# Patient Record
Sex: Male | Born: 1940
Health system: Southern US, Community
[De-identification: ages and names within clinical notes are randomized; demographics above are authoritative.]

## PROBLEM LIST (undated history)

## (undated) DIAGNOSIS — I5022 Chronic systolic (congestive) heart failure: Secondary | ICD-10-CM

## (undated) DIAGNOSIS — I4891 Unspecified atrial fibrillation: Secondary | ICD-10-CM

---

## 2016-03-02 ENCOUNTER — Encounter (HOSPITAL_COMMUNITY): Payer: Self-pay | Admitting: Emergency Medicine

## 2016-03-02 ENCOUNTER — Emergency Department (HOSPITAL_COMMUNITY): Payer: Commercial Managed Care - HMO

## 2016-03-02 ENCOUNTER — Inpatient Hospital Stay (HOSPITAL_COMMUNITY)
Admission: EM | Admit: 2016-03-02 | Discharge: 2016-03-24 | DRG: 233 | Disposition: A | Discharge status: Home Health | Payer: Commercial Managed Care - HMO | Attending: Cardiothoracic Surgery | Admitting: Cardiothoracic Surgery

## 2016-03-02 DIAGNOSIS — I110 Hypertensive heart disease with heart failure: Secondary | ICD-10-CM | POA: Diagnosis not present

## 2016-03-02 DIAGNOSIS — I739 Peripheral vascular disease, unspecified: Secondary | ICD-10-CM | POA: Diagnosis not present

## 2016-03-02 DIAGNOSIS — I10 Essential (primary) hypertension: Secondary | ICD-10-CM

## 2016-03-02 DIAGNOSIS — I200 Unstable angina: Secondary | ICD-10-CM | POA: Diagnosis not present

## 2016-03-02 DIAGNOSIS — R0602 Shortness of breath: Secondary | ICD-10-CM

## 2016-03-02 DIAGNOSIS — I779 Disorder of arteries and arterioles, unspecified: Secondary | ICD-10-CM | POA: Insufficient documentation

## 2016-03-02 DIAGNOSIS — N179 Acute kidney failure, unspecified: Secondary | ICD-10-CM | POA: Diagnosis present

## 2016-03-02 DIAGNOSIS — I25118 Atherosclerotic heart disease of native coronary artery with other forms of angina pectoris: Secondary | ICD-10-CM | POA: Diagnosis not present

## 2016-03-02 DIAGNOSIS — Z9119 Patient's noncompliance with other medical treatment and regimen: Secondary | ICD-10-CM | POA: Diagnosis not present

## 2016-03-02 DIAGNOSIS — Z4682 Encounter for fitting and adjustment of non-vascular catheter: Secondary | ICD-10-CM

## 2016-03-02 DIAGNOSIS — I5031 Acute diastolic (congestive) heart failure: Secondary | ICD-10-CM | POA: Diagnosis not present

## 2016-03-02 DIAGNOSIS — I2510 Atherosclerotic heart disease of native coronary artery without angina pectoris: Secondary | ICD-10-CM | POA: Insufficient documentation

## 2016-03-02 DIAGNOSIS — I25110 Atherosclerotic heart disease of native coronary artery with unstable angina pectoris: Secondary | ICD-10-CM | POA: Diagnosis not present

## 2016-03-02 DIAGNOSIS — E1122 Type 2 diabetes mellitus with diabetic chronic kidney disease: Secondary | ICD-10-CM | POA: Diagnosis present

## 2016-03-02 DIAGNOSIS — I4891 Unspecified atrial fibrillation: Secondary | ICD-10-CM

## 2016-03-02 DIAGNOSIS — I5033 Acute on chronic diastolic (congestive) heart failure: Secondary | ICD-10-CM | POA: Diagnosis not present

## 2016-03-02 DIAGNOSIS — I25119 Atherosclerotic heart disease of native coronary artery with unspecified angina pectoris: Secondary | ICD-10-CM | POA: Diagnosis not present

## 2016-03-02 DIAGNOSIS — N189 Chronic kidney disease, unspecified: Secondary | ICD-10-CM | POA: Diagnosis present

## 2016-03-02 DIAGNOSIS — I5041 Acute combined systolic (congestive) and diastolic (congestive) heart failure: Secondary | ICD-10-CM | POA: Diagnosis not present

## 2016-03-02 DIAGNOSIS — E1151 Type 2 diabetes mellitus with diabetic peripheral angiopathy without gangrene: Secondary | ICD-10-CM | POA: Diagnosis present

## 2016-03-02 DIAGNOSIS — L03115 Cellulitis of right lower limb: Secondary | ICD-10-CM | POA: Diagnosis not present

## 2016-03-02 DIAGNOSIS — E876 Hypokalemia: Secondary | ICD-10-CM | POA: Diagnosis present

## 2016-03-02 DIAGNOSIS — D62 Acute posthemorrhagic anemia: Secondary | ICD-10-CM | POA: Diagnosis not present

## 2016-03-02 DIAGNOSIS — I5043 Acute on chronic combined systolic (congestive) and diastolic (congestive) heart failure: Secondary | ICD-10-CM | POA: Diagnosis not present

## 2016-03-02 DIAGNOSIS — R1310 Dysphagia, unspecified: Secondary | ICD-10-CM | POA: Diagnosis not present

## 2016-03-02 DIAGNOSIS — J90 Pleural effusion, not elsewhere classified: Secondary | ICD-10-CM | POA: Diagnosis present

## 2016-03-02 DIAGNOSIS — M7989 Other specified soft tissue disorders: Secondary | ICD-10-CM | POA: Diagnosis not present

## 2016-03-02 DIAGNOSIS — R069 Unspecified abnormalities of breathing: Secondary | ICD-10-CM | POA: Diagnosis not present

## 2016-03-02 DIAGNOSIS — I252 Old myocardial infarction: Secondary | ICD-10-CM

## 2016-03-02 DIAGNOSIS — Z79899 Other long term (current) drug therapy: Secondary | ICD-10-CM

## 2016-03-02 DIAGNOSIS — D649 Anemia, unspecified: Secondary | ICD-10-CM | POA: Diagnosis not present

## 2016-03-02 DIAGNOSIS — I255 Ischemic cardiomyopathy: Secondary | ICD-10-CM | POA: Diagnosis present

## 2016-03-02 DIAGNOSIS — J9811 Atelectasis: Secondary | ICD-10-CM

## 2016-03-02 DIAGNOSIS — I080 Rheumatic disorders of both mitral and aortic valves: Secondary | ICD-10-CM | POA: Diagnosis not present

## 2016-03-02 DIAGNOSIS — R0989 Other specified symptoms and signs involving the circulatory and respiratory systems: Secondary | ICD-10-CM | POA: Insufficient documentation

## 2016-03-02 DIAGNOSIS — I481 Persistent atrial fibrillation: Secondary | ICD-10-CM | POA: Diagnosis not present

## 2016-03-02 DIAGNOSIS — I160 Hypertensive urgency: Secondary | ICD-10-CM | POA: Diagnosis not present

## 2016-03-02 DIAGNOSIS — Z951 Presence of aortocoronary bypass graft: Secondary | ICD-10-CM | POA: Diagnosis not present

## 2016-03-02 DIAGNOSIS — I6522 Occlusion and stenosis of left carotid artery: Secondary | ICD-10-CM | POA: Diagnosis not present

## 2016-03-02 DIAGNOSIS — I482 Chronic atrial fibrillation: Secondary | ICD-10-CM | POA: Diagnosis not present

## 2016-03-02 DIAGNOSIS — Z7982 Long term (current) use of aspirin: Secondary | ICD-10-CM

## 2016-03-02 DIAGNOSIS — R269 Unspecified abnormalities of gait and mobility: Secondary | ICD-10-CM | POA: Diagnosis not present

## 2016-03-02 DIAGNOSIS — Z95828 Presence of other vascular implants and grafts: Secondary | ICD-10-CM

## 2016-03-02 DIAGNOSIS — Z6826 Body mass index (BMI) 26.0-26.9, adult: Secondary | ICD-10-CM

## 2016-03-02 DIAGNOSIS — I130 Hypertensive heart and chronic kidney disease with heart failure and stage 1 through stage 4 chronic kidney disease, or unspecified chronic kidney disease: Secondary | ICD-10-CM | POA: Diagnosis not present

## 2016-03-02 DIAGNOSIS — R079 Chest pain, unspecified: Secondary | ICD-10-CM | POA: Diagnosis not present

## 2016-03-02 DIAGNOSIS — Z87891 Personal history of nicotine dependence: Secondary | ICD-10-CM | POA: Diagnosis not present

## 2016-03-02 DIAGNOSIS — I6523 Occlusion and stenosis of bilateral carotid arteries: Secondary | ICD-10-CM | POA: Diagnosis not present

## 2016-03-02 DIAGNOSIS — I259 Chronic ischemic heart disease, unspecified: Secondary | ICD-10-CM | POA: Diagnosis not present

## 2016-03-02 DIAGNOSIS — I509 Heart failure, unspecified: Secondary | ICD-10-CM

## 2016-03-02 DIAGNOSIS — I480 Paroxysmal atrial fibrillation: Secondary | ICD-10-CM | POA: Diagnosis not present

## 2016-03-02 DIAGNOSIS — J939 Pneumothorax, unspecified: Secondary | ICD-10-CM | POA: Diagnosis not present

## 2016-03-02 HISTORY — DX: Unspecified atrial fibrillation: I48.91

## 2016-03-02 LAB — CBC WITH DIFFERENTIAL/PLATELET
Basophils Absolute: 0 10*3/uL (ref 0.0–0.1)
Basophils Relative: 0 %
EOS ABS: 0.1 10*3/uL (ref 0.0–0.7)
Eosinophils Relative: 1 %
HEMATOCRIT: 46.1 % (ref 39.0–52.0)
HEMOGLOBIN: 16.3 g/dL (ref 13.0–17.0)
LYMPHS ABS: 1.8 10*3/uL (ref 0.7–4.0)
LYMPHS PCT: 17 %
MCH: 32.5 pg (ref 26.0–34.0)
MCHC: 35.4 g/dL (ref 30.0–36.0)
MCV: 91.8 fL (ref 78.0–100.0)
Monocytes Absolute: 1 10*3/uL (ref 0.1–1.0)
Monocytes Relative: 9 %
NEUTROS ABS: 7.7 10*3/uL (ref 1.7–7.7)
NEUTROS PCT: 73 %
Platelets: 214 10*3/uL (ref 150–400)
RBC: 5.02 MIL/uL (ref 4.22–5.81)
RDW: 13.5 % (ref 11.5–15.5)
WBC: 10.5 10*3/uL (ref 4.0–10.5)

## 2016-03-02 LAB — COMPREHENSIVE METABOLIC PANEL
ALBUMIN: 3.7 g/dL (ref 3.5–5.0)
ALT: 16 U/L — ABNORMAL LOW (ref 17–63)
ANION GAP: 15 (ref 5–15)
AST: 30 U/L (ref 15–41)
Alkaline Phosphatase: 69 U/L (ref 38–126)
BUN: 16 mg/dL (ref 6–20)
CHLORIDE: 105 mmol/L (ref 101–111)
CO2: 20 mmol/L — ABNORMAL LOW (ref 22–32)
Calcium: 10.3 mg/dL (ref 8.9–10.3)
Creatinine, Ser: 0.97 mg/dL (ref 0.61–1.24)
GFR calc Af Amer: >60 mL/min (ref >60)
GFR calc non Af Amer: >60 mL/min (ref >60)
GLUCOSE: 89 mg/dL (ref 65–99)
POTASSIUM: 4.6 mmol/L (ref 3.5–5.1)
SODIUM: 140 mmol/L (ref 135–145)
TOTAL PROTEIN: 6.8 g/dL (ref 6.5–8.1)
Total Bilirubin: 1.7 mg/dL — ABNORMAL HIGH (ref 0.3–1.2)

## 2016-03-02 LAB — I-STAT CHEM 8, ED
BUN: 21 mg/dL — AB (ref 6–20)
CALCIUM ION: 1.29 mmol/L (ref 1.13–1.30)
CHLORIDE: 103 mmol/L (ref 101–111)
CREATININE: 0.9 mg/dL (ref 0.61–1.24)
Glucose, Bld: 88 mg/dL (ref 65–99)
HEMATOCRIT: 49 % (ref 39.0–52.0)
Hemoglobin: 16.7 g/dL (ref 13.0–17.0)
Potassium: 4.2 mmol/L (ref 3.5–5.1)
SODIUM: 141 mmol/L (ref 135–145)
TCO2: 27 mmol/L (ref 0–100)

## 2016-03-02 LAB — TSH: TSH: 1.725 u[IU]/mL (ref 0.350–4.500)

## 2016-03-02 LAB — BRAIN NATRIURETIC PEPTIDE: B Natriuretic Peptide: 513.7 pg/mL — ABNORMAL HIGH (ref 0.0–100.0)

## 2016-03-02 LAB — TROPONIN I: Troponin I: 0.04 ng/mL — ABNORMAL HIGH (ref <0.031)

## 2016-03-02 MED ORDER — LISINOPRIL 20 MG PO TABS
20.0000 mg | ORAL_TABLET | Freq: Every day | ORAL | Status: DC
  Administered 2016-03-03: 20 mg via ORAL
  Filled 2016-03-02: qty 1

## 2016-03-02 MED ORDER — HYDRALAZINE HCL 20 MG/ML IJ SOLN
10.0000 mg | INTRAMUSCULAR | Status: DC | PRN
  Administered 2016-03-02 – 2016-03-03 (×2): 10 mg via INTRAVENOUS
  Filled 2016-03-02 (×3): qty 1

## 2016-03-02 MED ORDER — ACETAMINOPHEN 325 MG PO TABS: 650.0000 mg | ORAL_TABLET | ORAL | Status: DC | PRN

## 2016-03-02 MED ORDER — ONDANSETRON HCL 4 MG/2ML IJ SOLN: 4.0000 mg | Freq: Four times a day (QID) | INTRAMUSCULAR | Status: DC | PRN

## 2016-03-02 MED ORDER — FUROSEMIDE 10 MG/ML IJ SOLN: 40.0000 mg | Freq: Every day | INTRAMUSCULAR | Status: DC

## 2016-03-02 MED ORDER — SODIUM CHLORIDE 0.9% FLUSH
3.0000 mL | Freq: Two times a day (BID) | INTRAVENOUS | Status: DC
  Administered 2016-03-02 – 2016-03-09 (×9): 3 mL via INTRAVENOUS

## 2016-03-02 MED ORDER — IOHEXOL 350 MG/ML SOLN
80.0000 mL | Freq: Once | INTRAVENOUS | Status: AC | PRN
  Administered 2016-03-02: 80 mL via INTRAVENOUS

## 2016-03-02 MED ORDER — SODIUM CHLORIDE 0.9% FLUSH: 3.0000 mL | INTRAVENOUS | Status: DC | PRN

## 2016-03-02 MED ORDER — LISINOPRIL 10 MG PO TABS: 10.0000 mg | ORAL_TABLET | Freq: Every day | ORAL | Status: DC

## 2016-03-02 MED ORDER — LABETALOL HCL 5 MG/ML IV SOLN
10.0000 mg | INTRAVENOUS | Status: DC | PRN
  Filled 2016-03-02: qty 4

## 2016-03-02 MED ORDER — CARVEDILOL 6.25 MG PO TABS
6.2500 mg | ORAL_TABLET | Freq: Two times a day (BID) | ORAL | Status: DC
  Administered 2016-03-03 (×2): 6.25 mg via ORAL
  Filled 2016-03-02 (×2): qty 1

## 2016-03-02 MED ORDER — FUROSEMIDE 10 MG/ML IJ SOLN
40.0000 mg | Freq: Two times a day (BID) | INTRAMUSCULAR | Status: DC
  Administered 2016-03-03 (×2): 40 mg via INTRAVENOUS
  Filled 2016-03-02 (×2): qty 4

## 2016-03-02 MED ORDER — ENSURE ENLIVE PO LIQD
237.0000 mL | Freq: Two times a day (BID) | ORAL | Status: DC
  Administered 2016-03-03 – 2016-03-06 (×5): 237 mL via ORAL

## 2016-03-02 MED ORDER — ASPIRIN 81 MG PO CHEW
81.0000 mg | CHEWABLE_TABLET | ORAL | Status: DC
  Filled 2016-03-02: qty 1

## 2016-03-02 MED ORDER — ENOXAPARIN SODIUM 40 MG/0.4ML ~~LOC~~ SOLN
40.0000 mg | SUBCUTANEOUS | Status: DC
  Filled 2016-03-02 (×2): qty 0.4

## 2016-03-02 MED ORDER — SODIUM CHLORIDE 0.9 % IV SOLN: 250.0000 mL | INTRAVENOUS | Status: DC | PRN

## 2016-03-02 MED ORDER — FUROSEMIDE 10 MG/ML IJ SOLN
40.0000 mg | Freq: Once | INTRAMUSCULAR | Status: AC
  Administered 2016-03-02: 40 mg via INTRAVENOUS
  Filled 2016-03-02: qty 4

## 2016-03-02 NOTE — ED Notes (Signed)
Attempted to call report.  Floor reports they are unable to accept pt due to bp and will call rapid.

## 2016-03-02 NOTE — Progress Notes (Signed)
Pt arrived from ED, alert and oriented, tele placed, and BP 216/85 after receiving IV hydralazine in ED.  Pt asymptomatic, will continue to monitor and give PRNs for BP.

## 2016-03-02 NOTE — ED Provider Notes (Signed)
CSN: [id number]     Arrival date & time 03/02/16  1400 History   First MD Initiated Contact with Patient 03/02/16 1402     Chief Complaint  Patient presents with  . Shortness of Breath     (Consider location/radiation/quality/duration/timing/severity/associated sxs/prior Treatment) HPI  75 year old male presents with dyspnea. Sent from [facility] after being seen and had BP of 220/130. Has not seen a PCP in years. Reports no prior medical problems. Dyspnea is intermittent over last 1 week. Occurs multiple times per day, lasts a few minutes. No associated chest pain/pressure/tightness or diaphoresis. Has had RLE swelling over same 1 week. 1 week prior went to and back from [place] by car. No hemoptysis.   History reviewed. No pertinent past medical history. History reviewed. No pertinent past surgical history. No family history on file. Social History  Substance Use Topics  . Smoking status: Former Games developer  . Smokeless tobacco: None  . Alcohol Use: None    Review of Systems  Eyes: Negative for visual disturbance.  Respiratory: Positive for shortness of breath. Negative for cough.   Cardiovascular: Positive for leg swelling. Negative for chest pain.  Gastrointestinal: Negative for vomiting and abdominal pain.  All other systems reviewed and are negative.     Allergies  Review of patient's allergies indicates no known allergies.  Home Medications   Prior to Admission medications   Not on File   BP 165/121 mmHg  Pulse 99  Temp(Src) 97.8 F (36.6 C) (Oral)  Resp 24  SpO2 98% Physical Exam  Constitutional: He is oriented to person, place, and time. He appears well-developed and well-nourished.  HENT:  Head: Normocephalic and atraumatic.  Right Ear: External ear normal.  Left Ear: External ear normal.  Nose: Nose normal.  Eyes: Right eye exhibits no discharge. Left eye exhibits no discharge.  Neck: Neck supple.  Cardiovascular: Normal rate, regular rhythm,  normal heart sounds and intact distal pulses.   Pulmonary/Chest: Effort normal. He has rales in the right lower field and the left lower field.  Abdominal: Soft. There is no tenderness.  Musculoskeletal: He exhibits edema (Right calf/foot with pitting edema. No significant edema LLE).  Neurological: He is alert and oriented to person, place, and time.  Skin: Skin is warm and dry.  Nursing note and vitals reviewed.   ED Course  Procedures (including critical care time) Labs Review Labs Reviewed  COMPREHENSIVE METABOLIC PANEL - Abnormal; Notable for the following:    CO2 20 (*)    ALT 16 (*)    Total Bilirubin 1.7 (*)    All other components within normal limits  BRAIN NATRIURETIC PEPTIDE - Abnormal; Notable for the following:    B Natriuretic Peptide 513.7 (*)    All other components within normal limits  TROPONIN I - Abnormal; Notable for the following:    Troponin I 0.04 (*)    All other components within normal limits  I-STAT CHEM 8, ED - Abnormal; Notable for the following:    BUN 21 (*)    All other components within normal limits  CBC WITH DIFFERENTIAL/PLATELET    Imaging Review Dg Chest 2 View  03/02/2016  CLINICAL DATA:  Shortness of breath for 2 weeks.  Initial encounter. EXAM: CHEST  2 VIEW COMPARISON:  None. FINDINGS: The patient has small bilateral pleural effusions and basilar airspace disease, both worse on the right. Heart size is upper normal. No pneumothorax is identified. IMPRESSION: Right greater than left basilar airspace disease could be  due to atelectasis or pneumonia. Small pleural effusions are also seen, greater on the right. Electronically Signed   By: Drusilla Kannerhomas  Dalessio M.D.   On: 03/02/2016 15:20   Ct Angio Chest Pe W/cm &/or Wo Cm  03/02/2016  CLINICAL DATA:  Shortness of breath for 1 week EXAM: CT ANGIOGRAPHY CHEST WITH CONTRAST TECHNIQUE: Multidetector CT imaging of the chest was performed using the standard protocol during bolus administration of  intravenous contrast. Multiplanar CT image reconstructions and MIPs were obtained to evaluate the vascular anatomy. CONTRAST:  80mL OMNIPAQUE IOHEXOL 350 MG/ML SOLN COMPARISON:  Plain film from earlier in the same day FINDINGS: Lungs are well aerated bilaterally and demonstrate evidence of bilateral large pleural effusions right greater than left. Associated partial consolidation of the lower lobes is noted bilaterally. The thoracic inlet is within normal limits. Calcific changes of the thoracic aorta are noted without aneurysmal dilatation or dissection. The pulmonary artery shows a normal branching pattern. No filling defects to suggest pulmonary emboli are identified. No significant hilar or mediastinal adenopathy is noted. Scanning into the upper abdomen reveals no acute abnormality. The osseous structures show degenerative change of the thoracic spine. No acute bony abnormality is noted. Review of the MIP images confirms the above findings. IMPRESSION: Large bilateral pleural effusions with associated partial consolidation in the lower lobes. No evidence of pulmonary emboli. Electronically Signed   By: Alcide CleverMark  Lukens M.D.   On: 03/02/2016 15:55   I have personally reviewed and evaluated these images and lab results as part of my medical decision-making.   EKG Interpretation   Date/Time:  Monday March 02 2016 14:07:36 EDT Ventricular Rate:  92 PR Interval:    QRS Duration: 108 QT Interval:  385 QTC Calculation: 476 R Axis:   73 Text Interpretation:  Atrial flutter LVH with secondary repolarization  abnormality Borderline prolonged QT interval Baseline wander in lead(s) II  No old tracing to compare Confirmed by Ilai Hiller  MD, Devaughn Savant ([id number]) on  03/02/2016 2:10:51 PM      MDM   Final diagnoses:  Acute congestive heart failure, unspecified congestive heart failure type [facility](HCC)  Essential hypertension    Patient appears to have new onset CHF. Given his asymmetric leg swelling with recent travel  and dyspnea a CT was obtained to rule out PE and is negative except for the pleural effusions. Patient will be started on IV Lasix. He has hypertension with systolic blood pressures in the 160s-170s. However I think this is more of a subacute presentation. He will need to be admitted for supportive care, diuresis, and further workup. Dr. Izola PriceMyers to admit. DVT u/s currently pending but likely swelling is from CHF, unclear why it is asymmetric.    Pricilla LovelessScott Camil Hausmann, MD 03/02/16 [phone]

## 2016-03-02 NOTE — Progress Notes (Signed)
Preliminary results by tech - Venous Duplex Lower Ext. Completed. Negative for deep and superficial vein thrombosis in the right leg. Jyasia Markoff, BS, RDMS, RVT  

## 2016-03-02 NOTE — H&P (Addendum)
[facility] History and Physical  Demetrice Combes ZOX:[id number] DOB: Oct 06, 1941 DOA: 03/02/2016  Referring physician: ED physician, Dr. Lawrence Marseilles PCP: Joycelyn Rua, MD   Chief Complaint: sent from [facility] office for BP > 220/120  HPI:  Pt is 75 yo male, functional at baseline, presented from [place] office as his BP was > 220/120. Pt reports he has not seen doctor in years and currently reports only occasional exertional dyspnea and some weight gain but is not sure how many pounds. He denies fevers, chills, chest pain, no abd or urinary concerns, take no BP medications.   In ED, pt noted to be hemodynamically stable, VS notable for SBP 190's and blood work unremarkable. CXR with signs of CHF. TR[facility] asked to admit to telemetry bed as pt is asymptomatic.  Assessment and Plan: Active Problems: Hypertensive urgency - pt with dyspnea only otherwise asymptomatic - admit to tele bed if they will accept - place on Lasix, add BB and ACEI - also place on labetalol as needed - ECHO and EKG requested  Acute diastolic CHF - no previous history - Ok to admit to telemetry unit - place on lasix 40 mg IV BID - weight on admission 65 kg - monitor daily weights, strict I/O in AM - follow up on ECHO - consider cardio consult in AM - ? Consolidation in lower lobes - no WBC elevation and no fevers, pt denies productive cough - hold off on ABX and repeat imaging studies in AM to re evaluate response to lasix   Lovenox SQ for DVT prophylaxis   Radiological Exams on Admission: Dg Chest 2 View  03/02/2016  CLINICAL DATA:  Shortness of breath for 2 weeks.  Initial encounter. EXAM: CHEST  2 VIEW COMPARISON:  None. FINDINGS: The patient has small bilateral pleural effusions and basilar airspace disease, both worse on the right. Heart size is upper normal. No pneumothorax is identified. IMPRESSION: Right greater than left basilar airspace disease could be due to atelectasis or pneumonia. Small pleural  effusions are also seen, greater on the right. Electronically Signed   By: Drusilla Kanner M.D.   On: 03/02/2016 15:20   Ct Angio Chest Pe W/cm &/or Wo Cm  03/02/2016  CLINICAL DATA:  Shortness of breath for 1 week EXAM: CT ANGIOGRAPHY CHEST WITH CONTRAST TECHNIQUE: Multidetector CT imaging of the chest was performed using the standard protocol during bolus administration of intravenous contrast. Multiplanar CT image reconstructions and MIPs were obtained to evaluate the vascular anatomy. CONTRAST:  80mL OMNIPAQUE IOHEXOL 350 MG/ML SOLN COMPARISON:  Plain film from earlier in the same day FINDINGS: Lungs are well aerated bilaterally and demonstrate evidence of bilateral large pleural effusions right greater than left. Associated partial consolidation of the lower lobes is noted bilaterally. The thoracic inlet is within normal limits. Calcific changes of the thoracic aorta are noted without aneurysmal dilatation or dissection. The pulmonary artery shows a normal branching pattern. No filling defects to suggest pulmonary emboli are identified. No significant hilar or mediastinal adenopathy is noted. Scanning into the upper abdomen reveals no acute abnormality. The osseous structures show degenerative change of the thoracic spine. No acute bony abnormality is noted. Review of the MIP images confirms the above findings. IMPRESSION: Large bilateral pleural effusions with associated partial consolidation in the lower lobes. No evidence of pulmonary emboli. Electronically Signed   By: Alcide Clever M.D.   On: 03/02/2016 15:55     Code Status: Full Family Communication: Pt at bedside Disposition Plan: Admit for further  evaluation    Danie Binder [facility] [phone]   Review of Systems:  Constitutional: Negative for diaphoresis.  HENT: Negative for hearing loss, ear pain, nosebleeds, congestion, sore throat, neck pain, tinnitus and ear discharge.   Eyes: Negative for blurred vision, double vision, photophobia,  pain, discharge and redness.  Respiratory: Negative for wheezing and stridor.   Cardiovascular: Negative for chest pain, palpitations, orthopnea.  Gastrointestinal: Negative for nausea, vomiting and abdominal pain.  Genitourinary: Negative for dysuria, urgency, frequency, hematuria and flank pain.  Musculoskeletal: Negative for myalgias, back pain, joint pain and falls.  Skin: Negative for itching and rash.  Neurological: Negative for dizziness and weakness.  Endo/Heme/Allergies: Negative for environmental allergies and polydipsia. Does not bruise/bleed easily.  Psychiatric/Behavioral: Negative for suicidal ideas. The patient is not nervous/anxious.     Pt reports no known history of CHF  History reviewed. No pertinent past surgical history.  Social History:  reports that he has quit smoking. He does not have any smokeless tobacco history on file. His alcohol and drug histories are not on file.  No Known Allergies  Pt reports no family history of HTN or CHF.   Prior to Admission medications   Medication Sig Start Date End Date Taking? Authorizing Provider  Ascorbic Acid (VITAMIN C) 1000 MG tablet Take 1,000 mg by mouth daily.   Yes Historical Provider, MD  aspirin 81 MG tablet Take 81 mg by mouth every Monday, Wednesday, and Friday.   Yes Historical Provider, MD  magnesium 30 MG tablet Take 30 mg by mouth daily.   Yes Historical Provider, MD  Omega-3 Fatty Acids (FISH OIL) 1000 MG CAPS Take by mouth.   Yes Historical Provider, MD    Physical Exam: Filed Vitals:   03/02/16 1430 03/02/16 1445 03/02/16 1500 03/02/16 1554  BP: 171/120 168/126 174/109 167/153  Pulse: 87 99 85 99  Temp:      TempSrc:      Resp: SpO2: 94% 97% 93% 97%    Physical Exam  Constitutional: Appears well-developed and well-nourished. No distress.  HENT: Normocephalic. External right and left ear normal. Oropharynx is clear and moist.  Eyes: Conjunctivae and EOM are normal. PERRLA, no scleral  icterus.  Neck: Normal ROM. Neck supple. No tracheal deviation. No thyromegaly.  CVS: RRR, no gallops, no carotid bruit.  Pulmonary: Effort and breath sounds normal, bilateral crackles, no tachypnea and no wheezing  Abdominal: Soft. BS +,  no distension, tenderness, rebound or guarding.  Musculoskeletal: Normal range of motion.  Lymphadenopathy: No lymphadenopathy noted, cervical, inguinal. Neuro: Alert. Normal reflexes, muscle tone coordination. No cranial nerve deficit. Skin: Skin is warm and dry. No rash noted. Not diaphoretic. No erythema. No pallor.  Psychiatric: Normal mood and affect. Behavior, judgment, thought content normal.   Labs on Admission:  Basic Metabolic Panel:  Recent Labs Lab 03/02/16 1421 03/02/16 1429  NA 140 141  K 4.6 4.2  CL 105 103  CO2 20*  --   GLUCOSE 89 88  BUN 16 21*  CREATININE 0.97 0.90  CALCIUM 10.3  --    Liver Function Tests:  Recent Labs Lab 03/02/16 1421  AST 30  ALT 16*  ALKPHOS 69  BILITOT 1.7*  PROT 6.8  ALBUMIN 3.7   No results for input(s): LIPASE, AMYLASE in the last 168 hours. No results for input(s): AMMONIA in the last 168 hours. CBC:  Recent Labs Lab 03/02/16 1421 03/02/16 1429  WBC 10.5  --   NEUTROABS 7.7  --  HGB 16.3 16.7  HCT 46.1 49.0  MCV 91.8  --   PLT 214  --    Cardiac Enzymes:  Recent Labs Lab 03/02/16 1421  TROPONINI 0.04*   EKG: pending    If 7PM-7AM, please contact night-coverage [email or web address] Password [email or web address] 03/02/2016, 4:46 PM

## 2016-03-02 NOTE — Progress Notes (Signed)
Pt BP 210/111 in ED.  RN spoke with MD to make sure pt was appropriate for floor.  MD gave new orders for PRN BP meds and was ok with pt coming to 3e.  Report received from ED RN.

## 2016-03-02 NOTE — ED Notes (Signed)
Coming from [facility] for c/o sob getting worse over the last week , has not seen a dr in 5 years and takes no meds bp at dr office 220/130 no c/o cp or n/v or blurred vision. No cough

## 2016-03-02 NOTE — ED Notes (Addendum)
Vascular at bedside

## 2016-03-02 NOTE — ED Notes (Signed)
Patient transported to CT 

## 2016-03-03 ENCOUNTER — Encounter (HOSPITAL_COMMUNITY): Payer: Self-pay | Admitting: Internal Medicine

## 2016-03-03 ENCOUNTER — Ambulatory Visit (HOSPITAL_COMMUNITY): Payer: Commercial Managed Care - HMO

## 2016-03-03 DIAGNOSIS — I5031 Acute diastolic (congestive) heart failure: Secondary | ICD-10-CM | POA: Diagnosis present

## 2016-03-03 DIAGNOSIS — I5041 Acute combined systolic (congestive) and diastolic (congestive) heart failure: Secondary | ICD-10-CM | POA: Diagnosis present

## 2016-03-03 DIAGNOSIS — I509 Heart failure, unspecified: Secondary | ICD-10-CM

## 2016-03-03 DIAGNOSIS — J90 Pleural effusion, not elsewhere classified: Secondary | ICD-10-CM | POA: Diagnosis present

## 2016-03-03 DIAGNOSIS — I5033 Acute on chronic diastolic (congestive) heart failure: Secondary | ICD-10-CM

## 2016-03-03 DIAGNOSIS — I160 Hypertensive urgency: Secondary | ICD-10-CM

## 2016-03-03 DIAGNOSIS — E876 Hypokalemia: Secondary | ICD-10-CM | POA: Diagnosis present

## 2016-03-03 DIAGNOSIS — I110 Hypertensive heart disease with heart failure: Secondary | ICD-10-CM | POA: Insufficient documentation

## 2016-03-03 LAB — CBC
HEMATOCRIT: 44.6 % (ref 39.0–52.0)
HEMOGLOBIN: 15.4 g/dL (ref 13.0–17.0)
MCH: 31 pg (ref 26.0–34.0)
MCHC: 34.5 g/dL (ref 30.0–36.0)
MCV: 89.9 fL (ref 78.0–100.0)
Platelets: 219 10*3/uL (ref 150–400)
RBC: 4.96 MIL/uL (ref 4.22–5.81)
RDW: 13.5 % (ref 11.5–15.5)
WBC: 10.1 10*3/uL (ref 4.0–10.5)

## 2016-03-03 LAB — BASIC METABOLIC PANEL
ANION GAP: 13 (ref 5–15)
BUN: 15 mg/dL (ref 6–20)
CO2: 25 mmol/L (ref 22–32)
Calcium: 10.7 mg/dL — ABNORMAL HIGH (ref 8.9–10.3)
Chloride: 107 mmol/L (ref 101–111)
Creatinine, Ser: 0.99 mg/dL (ref 0.61–1.24)
GFR calc Af Amer: >60 mL/min (ref >60)
GFR calc non Af Amer: >60 mL/min (ref >60)
Glucose, Bld: 107 mg/dL — ABNORMAL HIGH (ref 65–99)
POTASSIUM: 3.3 mmol/L — AB (ref 3.5–5.1)
Sodium: 145 mmol/L (ref 135–145)

## 2016-03-03 LAB — ECHOCARDIOGRAM COMPLETE
Height: 62 in
Weight: 2312 oz

## 2016-03-03 MED ORDER — SODIUM CHLORIDE 0.9 % WEIGHT BASED INFUSION: 1.0000 mL/kg/h | INTRAVENOUS | Status: DC

## 2016-03-03 MED ORDER — CARVEDILOL 12.5 MG PO TABS
12.5000 mg | ORAL_TABLET | Freq: Two times a day (BID) | ORAL | Status: DC
  Administered 2016-03-03 – 2016-03-06 (×6): 12.5 mg via ORAL
  Filled 2016-03-03 (×6): qty 1

## 2016-03-03 MED ORDER — IRBESARTAN 300 MG PO TABS: 150.0000 mg | ORAL_TABLET | Freq: Every day | ORAL | Status: DC

## 2016-03-03 MED ORDER — ASPIRIN 81 MG PO CHEW
81.0000 mg | CHEWABLE_TABLET | ORAL | Status: AC
  Administered 2016-03-04: 81 mg via ORAL
  Filled 2016-03-03: qty 1

## 2016-03-03 MED ORDER — SODIUM CHLORIDE 0.9% FLUSH
3.0000 mL | Freq: Two times a day (BID) | INTRAVENOUS | Status: DC
  Administered 2016-03-03: 3 mL via INTRAVENOUS

## 2016-03-03 MED ORDER — POTASSIUM CHLORIDE CRYS ER 20 MEQ PO TBCR
40.0000 meq | EXTENDED_RELEASE_TABLET | Freq: Once | ORAL | Status: AC
  Administered 2016-03-03: 40 meq via ORAL
  Filled 2016-03-03: qty 2

## 2016-03-03 MED ORDER — SODIUM CHLORIDE 0.9% FLUSH: 3.0000 mL | INTRAVENOUS | Status: DC | PRN

## 2016-03-03 MED ORDER — SODIUM CHLORIDE 0.9 % IV SOLN: 250.0000 mL | INTRAVENOUS | Status: DC | PRN

## 2016-03-03 MED ORDER — SODIUM CHLORIDE 0.9 % WEIGHT BASED INFUSION
3.0000 mL/kg/h | INTRAVENOUS | Status: DC
  Administered 2016-03-04: 3 mL/kg/h via INTRAVENOUS

## 2016-03-03 NOTE — Progress Notes (Addendum)
Patient ID: [name] [name], male   DOB: Mar 12, 1941, 75 y.o.   MRN: [id number]  [facility] PROGRESS NOTE  Matas Burrows WUJ:[id number] DOB: Apr 16, 1941 DOA: 03/02/2016 PCP: Joycelyn Rua, MD   Brief narrative:    Pt is 75 yo male, functional at baseline, presented from [place] office as his BP was > 220/120. Pt reports he has not seen doctor in years and currently reports only occasional exertional dyspnea and some weight gain but is not sure how many pounds. He denies fevers, chills, chest pain, no abd or urinary concerns, take no BP medications.   In ED, pt noted to be hemodynamically stable, VS notable for SBP 190's and blood work unremarkable. CXR with signs of CHF. TRH asked to admit to telemetry bed as pt is asymptomatic.  Assessment/Plan:    Active Problems: Hypertensive urgency - pt with dyspnea only otherwise asymptomatic - less dyspnea this AM - continue BB, ACEI, added hydralazine as needed  - ECHO pending  - cardiology consulted  - SBP in 140's this AM  Acute systolic and diastolic CHF - no previous history - placed on lasix 40 mg IV BID - weight on admission 65 kg, still same this AM - monitor daily weights, strict I/O in AM - follow up on ECHO - cardiology consulted for assistance - CT chest findings discussed with PCCM, no sings of over PNA on CT  Hypokalemia - from lasix, supplement and repeat BMP in AM  DVT prophylaxis - Lovenox SQ  Code Status: Full Family Communication:  plan of care discussed with the patient Disposition Plan: Home by 3/22  IV access:  Peripheral IV  Procedures and diagnostic studies:    Dg Chest 2 View 03/02/2016 Right greater than left basilar airspace disease could be due to atelectasis or pneumonia. Small pleural effusions are also seen, greater on the right.   Ct Angio Chest Pe W/cm &/or Wo Cm 03/02/2016  Large bilateral pleural effusions with associated partial consolidation in the lower lobes. No evidence of pulmonary emboli.    Medical Consultants:  Cardiology   Other Consultants:  None  IAnti-Infectives:   None  Debbora Presto, MD  [facility] Pager [phone]  If 7PM-7AM, please contact night-coverage [email or web address] Password [facility] 03/03/2016, 12:23 PM   LOS: 1 day   HPI/Subjective: No events overnight. Less dyspnea.  Objective: Filed Vitals:   03/03/16 0357 03/03/16 0658 03/03/16 0938 03/03/16 1004  BP: 136/99 180/90 149/75 142/71  Pulse: 66   76  Temp: 97.9 F (36.6 C)   97.7 F (36.5 C)  TempSrc: Oral   Oral  Resp:    20  Height:      Weight: 65.545 kg (144 lb 8 oz)     SpO2: 98%   93%    Intake/Output Summary (Last 24 hours) at 03/03/16 1223 Last data filed at 03/03/16 8657  Gross per 24 hour  Intake    720 ml  Output    725 ml  Net     -5 ml    Exam:   General:  Pt is alert, follows commands appropriately, not in acute distress  Cardiovascular: Regular rate and rhythm, S1/S2, no murmurs, no rubs, no gallops  Respiratory: Clear to auscultation bilaterally, crackles at bases   Abdomen: Soft, non tender, non distended, bowel sounds present, no guarding  Extremities: pulses DP and PT palpable bilaterally  Neuro: Grossly nonfocal  Data Reviewed: Basic Metabolic Panel:  Recent Labs Lab 03/02/16 1421 03/02/16 1429 03/03/16 0354  NA 140 141 145  K 4.6 4.2 3.3*  CL 105 103 107  CO2 20*  --  25  GLUCOSE 89 88 107*  BUN 16 21* 15  CREATININE 0.97 0.90 0.99  CALCIUM 10.3  --  10.7*   Liver Function Tests:  Recent Labs Lab 03/02/16 1421  AST 30  ALT 16*  ALKPHOS 69  BILITOT 1.7*  PROT 6.8  ALBUMIN 3.7   CBC:  Recent Labs Lab 03/02/16 1421 03/02/16 1429 03/03/16 0354  WBC 10.5  --  10.1  NEUTROABS 7.7  --   --   HGB 16.3 16.7 15.4  HCT 46.1 49.0 44.6  MCV 91.8  --  89.9  PLT 214  --  219   Cardiac Enzymes:  Recent Labs Lab 03/02/16 1421  TROPONINI 0.04*   Scheduled Meds: . aspirin  81 mg Oral Q M,W,F  . carvedilol  6.25 mg Oral BID WC  .  enoxaparin (LOVENOX) injection  40 mg Subcutaneous Q24H  . feeding supplement (ENSURE ENLIVE)  237 mL Oral BID BM  . furosemide  40 mg Intravenous BID  . lisinopril  20 mg Oral Daily  . potassium chloride  40 mEq Oral Once  . sodium chloride flush  3 mL Intravenous Q12H   Continuous Infusions:

## 2016-03-03 NOTE — Progress Notes (Signed)
Nutrition Brief Note  Patient identified on the Malnutrition Screening Tool (MST) report.    Body mass index is 26.5 kg/(m^2).  Patient meets criteria for overweight based on current BMI.  Patient reports good appetite.  Current diet order is regular, patient is consuming approximately 75-100% of meals at this time.  Labs and medications reviewed.  No nutrition interventions warranted at this time.  If nutrition issues arise, please consult RD.  Doroteo Glassmanoleman Jazilyn Siegenthaler, Dietetic Intern Pager: [phone]

## 2016-03-03 NOTE — Progress Notes (Signed)
Echocardiogram 2D Echocardiogram has been performed.  Dorothey BasemanReel, Jakai Risse M 03/03/2016, 2:16 PM

## 2016-03-03 NOTE — Consult Note (Signed)
CARDIOLOGY CONSULT NOTE   Patient ID: [name] [name] MRN: [id number] DOB/AGE: Sep 25, 1941 75 y.o.  Admit date: 03/02/2016  Primary Physician   Joycelyn Rua, MD Primary Cardiologist: New Reason for Consultation: CHF   HPI: [name] [name] is a 75 year old male with no previous medical history, as he has not sought medical care in many years and does not take any medications.  He presented to [facility] yesterday (03/02/16) with increasing SOB over the past week.  His BP was 220/130.  He did not have any chest pain, SOB or cough.  He does endorse some lower extremity edema that appeared about one week ago.He does not weight himself regularly, but does feel like he has put on some weight.    In the ED he continued to be hypertensive with SBP's in 190's.  He was placed on a BB and ACEI and given IV Lasix.  Cardiology was consulted for further management.  [name] [name] is mostly inactive, he does not exercise.  He says that on a typical day the most activity he gets is taking care of things around his home.  He does not report any shortness of breath with these activities.  He is able to lie flat when sleeping, does not use any pillows as he reports that he sleeps on his stomach.  In the past week he has had paroxysmal nocturnal dyspnea for 3 nights in a row.    When asked about his diet, he says for breakfast he has one cup of cocoa.  For lunch he will have a can of low sodium soup, and sometimes a can of sardines.  He says for dinner he eats french toast.  He denies drinking more than 2L of fluid a day.  He drinks one to two beers per week.     History reviewed. No pertinent past medical history.   History reviewed. No pertinent past surgical history.  No Known Allergies  I have reviewed the patient's current medications . aspirin  81 mg Oral Q M,W,F  . carvedilol  6.25 mg Oral BID WC  . enoxaparin (LOVENOX) injection  40 mg Subcutaneous Q24H  . feeding supplement (ENSURE  ENLIVE)  237 mL Oral BID BM  . furosemide  40 mg Intravenous BID  . lisinopril  20 mg Oral Daily  . potassium chloride  40 mEq Oral Once  . sodium chloride flush  3 mL Intravenous Q12H     sodium chloride, acetaminophen, hydrALAZINE, labetalol, ondansetron (ZOFRAN) IV, sodium chloride flush  Prior to Admission medications   Medication Sig Start Date End Date Taking? Authorizing Provider  Ascorbic Acid (VITAMIN C) 1000 MG tablet Take 1,000 mg by mouth daily.   Yes Historical Provider, MD  aspirin 81 MG tablet Take 81 mg by mouth every Monday, Wednesday, and Friday.   Yes Historical Provider, MD  magnesium 30 MG tablet Take 30 mg by mouth daily.   Yes Historical Provider, MD  Omega-3 Fatty Acids (FISH OIL) 1000 MG CAPS Take by mouth.   Yes Historical Provider, MD     Social History   Social History  . Marital Status: Married    Spouse Name: N/A  . Number of Children: N/A  . Years of Education: N/A   Occupational History  . Not on file.   Social History Main Topics  . Smoking status: Former Games developer  . Smokeless tobacco: Not on file  . Alcohol Use: Not on file  . Drug Use:  Not on file  . Sexual Activity: Not on file   Other Topics Concern  . Not on file   Social History Narrative  . No narrative on file    No family status information on file.   No family history on file.   ROS:  Full 14 point review of systems complete and found to be negative unless listed above.  Physical Exam: Blood pressure 142/71, pulse 76, temperature 97.7 F (36.5 C), temperature source Oral, resp. rate 20, height  (1.575 m), weight 144 lb 8 oz (65.545 kg), SpO2 93 %.  General: Well developed, well nourished, male in no acute distress Head: Eyes PERRLA, No xanthomas.   Normocephalic and atraumatic, oropharynx without edema or exudate.  Lungs: CTA Heart: HRRR S1 S2, no rub/gallop, No murmur. Pulses are 2+ extrem.   Neck: No carotid bruits. No lymphadenopathy.  JVD. Abdomen: Bowel sounds  present, abdomen soft and non-tender without masses or hernias noted. Msk:  No spine or cva tenderness. No weakness, no joint deformities or effusions. Extremities: No clubbing or cyanosis.  edema.  Neuro: Alert and oriented X 3. No focal deficits noted. Psych:  Good affect, responds appropriately Skin: No rashes or lesions noted.  Labs:   Lab Results  Component Value Date   WBC 10.1 03/03/2016   HGB 15.4 03/03/2016   HCT 44.6 03/03/2016   MCV 89.9 03/03/2016   PLT 219 03/03/2016   No results for input(s): INR in the last 72 hours.  Recent Labs Lab 03/02/16 1421  03/03/16 0354  NA 140  < > 145  K 4.6  < > 3.3*  CL 105  < > 107  CO2 20*  --  25  BUN 16  < > 15  CREATININE 0.97  < > 0.99  CALCIUM 10.3  --  10.7*  PROT 6.8  --   --   BILITOT 1.7*  --   --   ALKPHOS 69  --   --   ALT 16*  --   --   AST 30  --   --   GLUCOSE 89  < > 107*  ALBUMIN 3.7  --   --   < > = values in this interval not displayed. No results found for: MG  Recent Labs  03/02/16 1421  TROPONINI 0.04*   No results for input(s): TROPIPOC in the last 72 hours. B NATRIURETIC PEPTIDE  Date/Time Value Ref Range Status  03/02/2016 02:21 PM 513.7* 0.0 - 100.0 pg/mL Final   No results found for: CHOL, HDL, LDLCALC, TRIG No results found for: DDIMER No results found for: LIPASE, AMYLASE TSH  Date/Time Value Ref Range Status  03/02/2016 08:27 PM 1.725 0.350 - 4.500 uIU/mL Final   No results found for: VITAMINB12, FOLATE, FERRITIN, TIBC, IRON, RETICCTPCT  Echo: 03/03/16 - Left ventricle: The cavity size was normal. There was mild  concentric hypertrophy. Systolic function was moderately reduced.  The estimated ejection fraction was in the range of 35% to 40%.  Diffuse hypokinesis with akinesis of the basal and mid  inferolateral walls. Features are consistent with a pseudonormal  left ventricular filling pattern, with concomitant abnormal  relaxation and increased filling pressure (grade 2  diastolic  dysfunction). Doppler parameters are consistent with elevated  ventricular end-diastolic filling pressure. - Aortic valve: Structurally normal valve. There was mild  regurgitation. - Aorta: The aorta was normal, not dilated, and non-diseased. - Mitral valve: Structurally normal valve. There was moderate  regurgitation. - Left atrium:  The atrium was mildly dilated. - Right ventricle: The cavity size was normal. Wall thickness was  normal. Systolic function was normal. - Right atrium: The atrium was normal in size. - Tricuspid valve: There was mild regurgitation. - Pulmonary arteries: Systolic pressure was mildly increased. PA  peak pressure: 38 mm Hg (S). - Pericardium, extracardiac: The pericardium was normal in  appearance. There was a left pleural effusion.  ECG: NSR with LVH   Radiology:  Dg Chest 2 View  03/02/2016  CLINICAL DATA:  Shortness of breath for 2 weeks.  Initial encounter. EXAM: CHEST  2 VIEW COMPARISON:  None. FINDINGS: The patient has small bilateral pleural effusions and basilar airspace disease, both worse on the right. Heart size is upper normal. No pneumothorax is identified. IMPRESSION: Right greater than left basilar airspace disease could be due to atelectasis or pneumonia. Small pleural effusions are also seen, greater on the right. Electronically Signed   By: Drusilla Kannerhomas  Dalessio M.D.   On: 03/02/2016 15:20   Ct Angio Chest Pe W/cm &/or Wo Cm  03/02/2016  CLINICAL DATA:  Shortness of breath for 1 week EXAM: CT ANGIOGRAPHY CHEST WITH CONTRAST TECHNIQUE: Multidetector CT imaging of the chest was performed using the standard protocol during bolus administration of intravenous contrast. Multiplanar CT image reconstructions and MIPs were obtained to evaluate the vascular anatomy. CONTRAST:  80mL OMNIPAQUE IOHEXOL 350 MG/ML SOLN COMPARISON:  Plain film from earlier in the same day FINDINGS: Lungs are well aerated bilaterally and demonstrate evidence of  bilateral large pleural effusions right greater than left. Associated partial consolidation of the lower lobes is noted bilaterally. The thoracic inlet is within normal limits. Calcific changes of the thoracic aorta are noted without aneurysmal dilatation or dissection. The pulmonary artery shows a normal branching pattern. No filling defects to suggest pulmonary emboli are identified. No significant hilar or mediastinal adenopathy is noted. Scanning into the upper abdomen reveals no acute abnormality. The osseous structures show degenerative change of the thoracic spine. No acute bony abnormality is noted. Review of the MIP images confirms the above findings. IMPRESSION: Large bilateral pleural effusions with associated partial consolidation in the lower lobes. No evidence of pulmonary emboli. Electronically Signed   By: Alcide CleverMark  Lukens M.D.   On: 03/02/2016 15:55    ASSESSMENT AND PLAN:    Active Problems:   CHF (congestive heart failure) (HCC)  1. Acute on chronic systolic and diastolic CHF - Patient presented with lower extremity edema, presumed weight gain, and shortness of breath.  He has no previous medical care.  Echo reveals LVEF of 35-40% and grade 2 diastolic dysfunction. Does not appear massively volume overloaded on exam but echo shows increased filling pressures and mildly elevated PA pressure.  Would continue IV Lasix again tomorrow BID. He is positive 240ml for today. Daily weights and strict I&O. Renal function stable.  BP still elevated, but better.  ACEI increased today.  Continue carvedilol as he seems to be tolerating it well. Would benefit from nutrition counseling.  He will need ischemic evaluation as his echo reflects diffuse hypokinesis with akinesis of the basal and mid inferolateral walls.   Signed: Little IshikawaErin E Smith, NP 03/03/2016 11:46 AM Pager [phone]  Co-Sign MD

## 2016-03-04 ENCOUNTER — Encounter (HOSPITAL_COMMUNITY): Payer: Self-pay | Admitting: Cardiothoracic Surgery

## 2016-03-04 ENCOUNTER — Inpatient Hospital Stay (HOSPITAL_COMMUNITY): Payer: Commercial Managed Care - HMO

## 2016-03-04 ENCOUNTER — Encounter (HOSPITAL_COMMUNITY)
Admission: EM | Disposition: A | Discharge status: Home Health | Payer: Self-pay | Source: Home / Self Care | Attending: Cardiothoracic Surgery

## 2016-03-04 ENCOUNTER — Other Ambulatory Visit: Payer: Self-pay | Admitting: *Deleted

## 2016-03-04 ENCOUNTER — Encounter (HOSPITAL_COMMUNITY): Payer: Self-pay | Admitting: Certified Registered Nurse Anesthetist

## 2016-03-04 DIAGNOSIS — I10 Essential (primary) hypertension: Secondary | ICD-10-CM | POA: Insufficient documentation

## 2016-03-04 DIAGNOSIS — I25119 Atherosclerotic heart disease of native coronary artery with unspecified angina pectoris: Secondary | ICD-10-CM

## 2016-03-04 DIAGNOSIS — R0989 Other specified symptoms and signs involving the circulatory and respiratory systems: Secondary | ICD-10-CM | POA: Insufficient documentation

## 2016-03-04 DIAGNOSIS — I2510 Atherosclerotic heart disease of native coronary artery without angina pectoris: Secondary | ICD-10-CM

## 2016-03-04 DIAGNOSIS — I5043 Acute on chronic combined systolic (congestive) and diastolic (congestive) heart failure: Secondary | ICD-10-CM

## 2016-03-04 DIAGNOSIS — I25110 Atherosclerotic heart disease of native coronary artery with unstable angina pectoris: Secondary | ICD-10-CM

## 2016-03-04 HISTORY — PX: CARDIAC CATHETERIZATION: SHX172

## 2016-03-04 LAB — POCT I-STAT 3, ART BLOOD GAS (G3+)
Acid-Base Excess: 1 mmol/L (ref 0.0–2.0)
BICARBONATE: 23.9 meq/L (ref 20.0–24.0)
O2 Saturation: 96 %
PCO2 ART: 32.8 mmHg — AB (ref 35.0–45.0)
PO2 ART: 79 mmHg — AB (ref 80.0–100.0)
TCO2: 25 mmol/L (ref 0–100)
pH, Arterial: 7.471 — ABNORMAL HIGH (ref 7.350–7.450)

## 2016-03-04 LAB — URINALYSIS, ROUTINE W REFLEX MICROSCOPIC
Bilirubin Urine: NEGATIVE
Glucose, UA: NEGATIVE mg/dL
Hgb urine dipstick: NEGATIVE
Ketones, ur: 15 mg/dL — AB
Leukocytes, UA: NEGATIVE
Nitrite: NEGATIVE
Protein, ur: NEGATIVE mg/dL
Specific Gravity, Urine: 1.01 (ref 1.005–1.030)
pH: 6.5 (ref 5.0–8.0)

## 2016-03-04 LAB — LIPID PANEL
Cholesterol: 186 mg/dL (ref 0–200)
HDL: 41 mg/dL (ref >40)
LDL CALC: 132 mg/dL — AB (ref 0–99)
Total CHOL/HDL Ratio: 4.5 RATIO
Triglycerides: 66 mg/dL (ref <150)
VLDL: 13 mg/dL (ref 0–40)

## 2016-03-04 LAB — POCT I-STAT 3, VENOUS BLOOD GAS (G3P V)
BICARBONATE: 24 meq/L (ref 20.0–24.0)
O2 SAT: 70 %
PH VEN: 7.425 — AB (ref 7.250–7.300)
PO2 VEN: 36 mmHg (ref 31.0–45.0)
TCO2: 25 mmol/L (ref 0–100)
pCO2, Ven: 36.5 mmHg — ABNORMAL LOW (ref 45.0–50.0)

## 2016-03-04 LAB — PROTIME-INR
INR: 1.09 (ref 0.00–1.49)
PROTHROMBIN TIME: 14.3 s (ref 11.6–15.2)

## 2016-03-04 LAB — BASIC METABOLIC PANEL
Anion gap: 7 (ref 5–15)
BUN: 20 mg/dL (ref 6–20)
CALCIUM: 10.2 mg/dL (ref 8.9–10.3)
CO2: 24 mmol/L (ref 22–32)
CREATININE: 1.4 mg/dL — AB (ref 0.61–1.24)
Chloride: 109 mmol/L (ref 101–111)
GFR calc Af Amer: 55 mL/min — ABNORMAL LOW (ref >60)
GFR calc non Af Amer: 48 mL/min — ABNORMAL LOW (ref >60)
GLUCOSE: 116 mg/dL — AB (ref 65–99)
Potassium: 3.1 mmol/L — ABNORMAL LOW (ref 3.5–5.1)
Sodium: 140 mmol/L (ref 135–145)

## 2016-03-04 LAB — ABO/RH: ABO/RH(D): A POS

## 2016-03-04 LAB — SURGICAL PCR SCREEN
MRSA, PCR: NEGATIVE
Staphylococcus aureus: NEGATIVE

## 2016-03-04 LAB — TSH: TSH: 0.666 u[IU]/mL (ref 0.350–4.500)

## 2016-03-04 LAB — POCT ACTIVATED CLOTTING TIME: ACTIVATED CLOTTING TIME: 137 s

## 2016-03-04 LAB — PREPARE RBC (CROSSMATCH)

## 2016-03-04 SURGERY — RIGHT/LEFT HEART CATH AND CORONARY ANGIOGRAPHY

## 2016-03-04 MED ORDER — VANCOMYCIN HCL 10 G IV SOLR
1250.0000 mg | INTRAVENOUS | Status: DC
  Filled 2016-03-04: qty 1250

## 2016-03-04 MED ORDER — IOPAMIDOL (ISOVUE-370) INJECTION 76%
INTRAVENOUS | Status: DC | PRN
  Administered 2016-03-04: 80 mL via INTRAVENOUS

## 2016-03-04 MED ORDER — AMIODARONE LOAD VIA INFUSION
150.0000 mg | Freq: Once | INTRAVENOUS | Status: AC
  Administered 2016-03-04: 150 mg via INTRAVENOUS
  Filled 2016-03-04: qty 83.34

## 2016-03-04 MED ORDER — POTASSIUM CHLORIDE 20 MEQ/15ML (10%) PO SOLN
40.0000 meq | Freq: Once | ORAL | Status: AC
  Administered 2016-03-04: 40 meq via ORAL
  Filled 2016-03-04: qty 30

## 2016-03-04 MED ORDER — ASPIRIN 81 MG PO CHEW
81.0000 mg | CHEWABLE_TABLET | Freq: Every day | ORAL | Status: DC
  Administered 2016-03-05 – 2016-03-09 (×5): 81 mg via ORAL
  Filled 2016-03-04 (×6): qty 1

## 2016-03-04 MED ORDER — POTASSIUM CHLORIDE CRYS ER 20 MEQ PO TBCR
40.0000 meq | EXTENDED_RELEASE_TABLET | Freq: Once | ORAL | Status: DC
Start: 2016-03-04 — End: 2016-03-04

## 2016-03-04 MED ORDER — AMIODARONE HCL IN DEXTROSE 360-4.14 MG/200ML-% IV SOLN
30.0000 mg/h | INTRAVENOUS | Status: DC
  Administered 2016-03-04 – 2016-03-05 (×2): 30 mg/h via INTRAVENOUS
  Filled 2016-03-04 (×2): qty 200

## 2016-03-04 MED ORDER — HEPARIN SODIUM (PORCINE) 1000 UNIT/ML IJ SOLN
INTRAMUSCULAR | Status: DC | PRN
  Administered 2016-03-04: 2000 [IU] via INTRAVENOUS

## 2016-03-04 MED ORDER — HEPARIN (PORCINE) IN NACL 2-0.9 UNIT/ML-% IJ SOLN
INTRAMUSCULAR | Status: DC | PRN
  Administered 2016-03-04: 1000 mL
  Administered 2016-03-04: 500 mL

## 2016-03-04 MED ORDER — SODIUM CHLORIDE 0.9% FLUSH: 3.0000 mL | INTRAVENOUS | Status: DC | PRN

## 2016-03-04 MED ORDER — FENTANYL CITRATE (PF) 100 MCG/2ML IJ SOLN
INTRAMUSCULAR | Status: DC | PRN
  Administered 2016-03-04: 25 ug via INTRAVENOUS

## 2016-03-04 MED ORDER — TEMAZEPAM 15 MG PO CAPS: 15.0000 mg | ORAL_CAPSULE | Freq: Once | ORAL | Status: DC | PRN

## 2016-03-04 MED ORDER — CHLORHEXIDINE GLUCONATE CLOTH 2 % EX PADS: 6.0000 | MEDICATED_PAD | Freq: Once | CUTANEOUS | Status: DC

## 2016-03-04 MED ORDER — MIDAZOLAM HCL 2 MG/2ML IJ SOLN
INTRAMUSCULAR | Status: AC
  Filled 2016-03-04: qty 2

## 2016-03-04 MED ORDER — MAGNESIUM SULFATE 50 % IJ SOLN
40.0000 meq | INTRAMUSCULAR | Status: DC
  Filled 2016-03-04: qty 10

## 2016-03-04 MED ORDER — HEPARIN SODIUM (PORCINE) 1000 UNIT/ML IJ SOLN
INTRAMUSCULAR | Status: AC
  Filled 2016-03-04: qty 1

## 2016-03-04 MED ORDER — HEPARIN (PORCINE) IN NACL 2-0.9 UNIT/ML-% IJ SOLN
INTRAMUSCULAR | Status: AC
  Filled 2016-03-04: qty 1000

## 2016-03-04 MED ORDER — FENTANYL CITRATE (PF) 100 MCG/2ML IJ SOLN
INTRAMUSCULAR | Status: AC
  Filled 2016-03-04: qty 2

## 2016-03-04 MED ORDER — SODIUM CHLORIDE 0.9 % IV SOLN: 250.0000 mL | INTRAVENOUS | Status: DC | PRN

## 2016-03-04 MED ORDER — POTASSIUM CHLORIDE 2 MEQ/ML IV SOLN
80.0000 meq | INTRAVENOUS | Status: DC
  Filled 2016-03-04: qty 40

## 2016-03-04 MED ORDER — ACETAMINOPHEN 325 MG PO TABS: 650.0000 mg | ORAL_TABLET | ORAL | Status: DC | PRN

## 2016-03-04 MED ORDER — HEPARIN (PORCINE) IN NACL 2-0.9 UNIT/ML-% IJ SOLN
INTRAMUSCULAR | Status: AC
  Filled 2016-03-04: qty 500

## 2016-03-04 MED ORDER — SODIUM CHLORIDE 0.9 % IV SOLN
INTRAVENOUS | Status: DC
  Filled 2016-03-04: qty 40

## 2016-03-04 MED ORDER — IOPAMIDOL (ISOVUE-370) INJECTION 76%
INTRAVENOUS | Status: AC
  Filled 2016-03-04: qty 100

## 2016-03-04 MED ORDER — SODIUM CHLORIDE 0.9% FLUSH
3.0000 mL | Freq: Two times a day (BID) | INTRAVENOUS | Status: DC
  Administered 2016-03-05 – 2016-03-09 (×7): 3 mL via INTRAVENOUS

## 2016-03-04 MED ORDER — FUROSEMIDE 10 MG/ML IJ SOLN
40.0000 mg | Freq: Once | INTRAMUSCULAR | Status: AC
  Administered 2016-03-04: 40 mg via INTRAVENOUS
  Filled 2016-03-04: qty 4

## 2016-03-04 MED ORDER — DEXTROSE 5 % IV SOLN
30.0000 ug/min | INTRAVENOUS | Status: DC
  Filled 2016-03-04: qty 2

## 2016-03-04 MED ORDER — NITROGLYCERIN IN D5W 200-5 MCG/ML-% IV SOLN: 2.0000 ug/min | INTRAVENOUS | Status: DC

## 2016-03-04 MED ORDER — ONDANSETRON HCL 4 MG/2ML IJ SOLN: 4.0000 mg | Freq: Four times a day (QID) | INTRAMUSCULAR | Status: DC | PRN

## 2016-03-04 MED ORDER — DEXMEDETOMIDINE HCL IN NACL 400 MCG/100ML IV SOLN
0.1000 ug/kg/h | INTRAVENOUS | Status: DC
  Filled 2016-03-04: qty 100

## 2016-03-04 MED ORDER — DEXTROSE 5 % IV SOLN
1.5000 g | INTRAVENOUS | Status: DC
  Filled 2016-03-04: qty 1.5

## 2016-03-04 MED ORDER — LIDOCAINE HCL (PF) 1 % IJ SOLN
INTRAMUSCULAR | Status: AC
  Filled 2016-03-04: qty 30

## 2016-03-04 MED ORDER — ATORVASTATIN CALCIUM 80 MG PO TABS
80.0000 mg | ORAL_TABLET | Freq: Every day | ORAL | Status: DC
  Administered 2016-03-05 – 2016-03-23 (×17): 80 mg via ORAL
  Filled 2016-03-04 (×17): qty 1

## 2016-03-04 MED ORDER — POTASSIUM CHLORIDE 10 MEQ/100ML IV SOLN
10.0000 meq | INTRAVENOUS | Status: AC
  Administered 2016-03-04 (×6): 10 meq via INTRAVENOUS
  Filled 2016-03-04 (×5): qty 100

## 2016-03-04 MED ORDER — EPINEPHRINE HCL 1 MG/ML IJ SOLN
0.0000 ug/min | INTRAVENOUS | Status: DC
  Filled 2016-03-04: qty 4

## 2016-03-04 MED ORDER — SODIUM CHLORIDE 0.9 % IV SOLN: INTRAVENOUS | Status: DC

## 2016-03-04 MED ORDER — AMIODARONE HCL IN DEXTROSE 360-4.14 MG/200ML-% IV SOLN
60.0000 mg/h | INTRAVENOUS | Status: AC
  Administered 2016-03-04: 60 mg/h via INTRAVENOUS
  Filled 2016-03-04: qty 200

## 2016-03-04 MED ORDER — LIDOCAINE HCL (PF) 1 % IJ SOLN
INTRAMUSCULAR | Status: DC | PRN
  Administered 2016-03-04: 20 mL

## 2016-03-04 MED ORDER — DIAZEPAM 2 MG PO TABS: 2.0000 mg | ORAL_TABLET | Freq: Once | ORAL | Status: DC

## 2016-03-04 MED ORDER — DOPAMINE-DEXTROSE 3.2-5 MG/ML-% IV SOLN
0.0000 ug/kg/min | INTRAVENOUS | Status: DC
Start: 2016-03-05 — End: 2016-03-04
  Filled 2016-03-04: qty 250

## 2016-03-04 MED ORDER — PLASMA-LYTE 148 IV SOLN
INTRAVENOUS | Status: DC
  Filled 2016-03-04: qty 2.5

## 2016-03-04 MED ORDER — HYDRALAZINE HCL 10 MG PO TABS
10.0000 mg | ORAL_TABLET | Freq: Three times a day (TID) | ORAL | Status: DC
  Administered 2016-03-04 – 2016-03-07 (×9): 10 mg via ORAL
  Filled 2016-03-04 (×10): qty 1

## 2016-03-04 MED ORDER — SODIUM CHLORIDE 0.9 % IV SOLN
INTRAVENOUS | Status: DC
  Filled 2016-03-04: qty 30

## 2016-03-04 MED ORDER — BISACODYL 5 MG PO TBEC
5.0000 mg | DELAYED_RELEASE_TABLET | Freq: Once | ORAL | Status: AC
  Administered 2016-03-04: 5 mg via ORAL
  Filled 2016-03-04: qty 1

## 2016-03-04 MED ORDER — CHLORHEXIDINE GLUCONATE 0.12 % MT SOLN: 15.0000 mL | Freq: Once | OROMUCOSAL | Status: DC

## 2016-03-04 MED ORDER — MIDAZOLAM HCL 2 MG/2ML IJ SOLN
INTRAMUSCULAR | Status: DC | PRN
Start: 2016-03-04 — End: 2016-03-04
  Administered 2016-03-04: 2 mg via INTRAVENOUS

## 2016-03-04 MED ORDER — METOPROLOL TARTRATE 12.5 MG HALF TABLET: 12.5000 mg | ORAL_TABLET | Freq: Once | ORAL | Status: DC

## 2016-03-04 MED ORDER — ALPRAZOLAM 0.25 MG PO TABS: 0.2500 mg | ORAL_TABLET | ORAL | Status: DC | PRN

## 2016-03-04 MED ORDER — DEXTROSE 5 % IV SOLN
750.0000 mg | INTRAVENOUS | Status: DC
  Filled 2016-03-04: qty 750

## 2016-03-04 MED ORDER — SODIUM CHLORIDE 0.9 % IV SOLN
INTRAVENOUS | Status: DC
  Filled 2016-03-04: qty 2.5

## 2016-03-04 MED ORDER — HEPARIN (PORCINE) IN NACL 100-0.45 UNIT/ML-% IJ SOLN
1300.0000 [IU]/h | INTRAMUSCULAR | Status: DC
  Administered 2016-03-04: 850 [IU]/h via INTRAVENOUS
  Administered 2016-03-05: 1150 [IU]/h via INTRAVENOUS
  Filled 2016-03-04 (×2): qty 250

## 2016-03-04 SURGICAL SUPPLY — 13 items
CATH INFINITI 5FR MULTPACK ANG (CATHETERS) ×3 IMPLANT
CATH SWAN GANZ 7F STRAIGHT (CATHETERS) ×6 IMPLANT
CATH SWAN GANZ 7FR S TIP (CATHETERS) ×3 IMPLANT
KIT HEART LEFT (KITS) ×3 IMPLANT
KIT HEART RIGHT NAMIC (KITS) ×3 IMPLANT
PACK CARDIAC CATHETERIZATION (CUSTOM PROCEDURE TRAY) ×3 IMPLANT
SHEATH PINNACLE 5F 10CM (SHEATH) ×3 IMPLANT
SHEATH PINNACLE 7F 10CM (SHEATH) ×3 IMPLANT
SYR MEDRAD MARK V 150ML (SYRINGE) ×3 IMPLANT
TRANSDUCER W/STOPCOCK (MISCELLANEOUS) ×6 IMPLANT
WIRE EMERALD 3MM-J .025X260CM (WIRE) ×3 IMPLANT
WIRE EMERALD 3MM-J .035X150CM (WIRE) ×3 IMPLANT
WIRE EMERALD 3MM-J .035X260CM (WIRE) ×3 IMPLANT

## 2016-03-04 NOTE — Progress Notes (Signed)
ANTICOAGULATION CONSULT NOTE - Initial Consult  Pharmacy Consult for heparin Indication: atrial fibrillation , possible CABG   No Known Allergies  Patient Measurements: Height: 5\' 2"  (157.5 cm) Weight: 142 lb 8 oz (64.638 kg) (scale b) IBW/kg (Calculated) : 54.6 Heparin Dosing Weight: 65.6kg  Vital Signs: Temp: 98.1 F (36.7 C) (03/22 0610) Temp Source: Oral (03/22 0610) BP: 151/96 mmHg (03/22 1251) Pulse Rate: 93 (03/22 1251)  Labs:  Recent Labs  03/02/16 1421 03/02/16 1429 03/03/16 0354 03/04/16 0400  HGB 16.3 16.7 15.4  --   HCT 46.1 49.0 44.6  --   PLT 214  --  219  --   LABPROT  --   --   --  14.3  INR  --   --   --  1.09  CREATININE 0.97 0.90 0.99 1.40*  TROPONINI 0.04*  --   --   --     Estimated Creatinine Clearance: 35.2 mL/min (by C-G formula based on Cr of 1.4).  Assessment: 89 YOM s/p cath on 3/22 with sig vessel disease, went into AFib during procedure. 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 Not on heparin pre-cath, now to start 8h post sheath removal. Per cath report, sheath pulled at 1123 this morning.  Hgb 15.4, plts 219- no excessive bleeding noted.  Goal of Therapy:  Heparin level 0.3-0.7 units/ml Monitor platelets by anticoagulation protocol: Yes   Plan:  -stop prophylactic Lovenox -start heparin at 2000 tonight at rate of 850 units/hr -first heparin level with AM labs -daily HL and CBC -follow plans for CABG and long term Anticoagulation  Brookelin Felber D. Imelda Dandridge, PharmD, BCPS Clinical Pharmacist Pager: [phone] 03/04/2016 1:35 PM

## 2016-03-04 NOTE — Progress Notes (Signed)
Patient Name: [name] [name] Date of Encounter: 03/04/2016  Principal Problem:   Acute systolic and diastolic CHF (congestive heart failure), NYHA class 1 (HCC) Active Problems:   Hypertensive urgency   Hypokalemia   Bilateral pleural effusion   Acute combined systolic and diastolic heart failure (HCC)   Hypertensive heart disease with heart failure [facility])   Primary Cardiologist: New Patient Profile: [name] [name] is a 75 year old male who has not sought medical care in many years who presented to ED on 03/02/16 with SOB, hypertensive with BP of 220/130, and lower extremity edema.  His echo shows EF of 35% with diffuse hypokinesis with akinesis of the basal and midinferolateral walls.    SUBJECTIVE: Feels well today.  No SOB or chest pain.   OBJECTIVE Filed Vitals:   03/03/16 2303 03/04/16 0103 03/04/16 0610 03/04/16 0853  BP: 165/87 153/75 170/88 166/84  Pulse: 68 70 73 62  Temp:  98.5 F (36.9 C) 98.1 F (36.7 C)   TempSrc:  Oral Oral   Resp:  20 20   Height:      Weight:   142 lb 8 oz (64.638 kg)   SpO2:  94% 95%     Intake/Output Summary (Last 24 hours) at 03/04/16 0853 Last data filed at 03/04/16 0109  Gross per 24 hour  Intake    480 ml  Output    675 ml  Net   -195 ml   Filed Weights   03/03/16 0357 03/03/16 1803 03/04/16 0610  Weight: 144 lb 8 oz (65.545 kg) 144 lb 8 oz (65.545 kg) 142 lb 8 oz (64.638 kg)    PHYSICAL EXAM General: Well developed, well nourished, male in no acute distress. Head: Normocephalic, atraumatic.  Neck: Supple without bruits, 3-4 cm JVD. Lungs:  Resp regular and unlabored, CTA, diminished in bases.  Heart: RRR, S1, S2, no S3, S4, or murmur; no rub. Abdomen: Soft, non-tender, non-distended, BS + x 4.  Extremities: No clubbing, cyanosis, No edema.  Neuro: Alert and oriented X 3. Moves all extremities spontaneously. Psych: Normal affect.  LABS: CBC: Recent Labs  03/02/16 1421 03/02/16 1429 03/03/16 0354  WBC 10.5  --  10.1    NEUTROABS 7.7  --   --   HGB 16.3 16.7 15.4  HCT 46.1 49.0 44.6  MCV 91.8  --  89.9  PLT 214  --  219   INR: Recent Labs  03/04/16 0400  INR 1.09   Basic Metabolic Panel: Recent Labs  03/03/16 0354 03/04/16 0400  NA 145 140  K 3.3* 3.1*  CL 107 109  CO2 25 24  GLUCOSE 107* 116*  BUN 15 20  CREATININE 0.99 1.40*  CALCIUM 10.7* 10.2   Liver Function Tests: Recent Labs  03/02/16 1421  AST 30  ALT 16*  ALKPHOS 69  BILITOT 1.7*  PROT 6.8  ALBUMIN 3.7   Cardiac Enzymes: Recent Labs  03/02/16 1421  TROPONINI 0.04*   No results for input(s): TROPIPOC in the last 72 hours. BNP:  B NATRIURETIC PEPTIDE  Date/Time Value Ref Range Status  03/02/2016 02:21 PM 513.7* 0.0 - 100.0 pg/mL Final   Fasting Lipid Panel: Recent Labs  03/04/16 0400  CHOL 186  HDL 41  LDLCALC 132*  TRIG 66  CHOLHDL 4.5   Thyroid Function Tests: Recent Labs  03/02/16 2027  TSH 1.725    TELE: NSR     ECG: NSR with LVH.   Radiology/Studies: Dg Chest 2 View  03/02/2016  CLINICAL DATA:  Shortness of breath for 2 weeks.  Initial encounter. EXAM: CHEST  2 VIEW COMPARISON:  None. FINDINGS: The patient has small bilateral pleural effusions and basilar airspace disease, both worse on the right. Heart size is upper normal. No pneumothorax is identified. IMPRESSION: Right greater than left basilar airspace disease could be due to atelectasis or pneumonia. Small pleural effusions are also seen, greater on the right. Electronically Signed   By: Drusilla Kannerhomas  Dalessio M.D.   On: 03/02/2016 15:20   Ct Angio Chest Pe W/cm &/or Wo Cm  03/02/2016  CLINICAL DATA:  Shortness of breath for 1 week EXAM: CT ANGIOGRAPHY CHEST WITH CONTRAST TECHNIQUE: Multidetector CT imaging of the chest was performed using the standard protocol during bolus administration of intravenous contrast. Multiplanar CT image reconstructions and MIPs were obtained to evaluate the vascular anatomy. CONTRAST:  80mL OMNIPAQUE IOHEXOL 350  MG/ML SOLN COMPARISON:  Plain film from earlier in the same day FINDINGS: Lungs are well aerated bilaterally and demonstrate evidence of bilateral large pleural effusions right greater than left. Associated partial consolidation of the lower lobes is noted bilaterally. The thoracic inlet is within normal limits. Calcific changes of the thoracic aorta are noted without aneurysmal dilatation or dissection. The pulmonary artery shows a normal branching pattern. No filling defects to suggest pulmonary emboli are identified. No significant hilar or mediastinal adenopathy is noted. Scanning into the upper abdomen reveals no acute abnormality. The osseous structures show degenerative change of the thoracic spine. No acute bony abnormality is noted. Review of the MIP images confirms the above findings. IMPRESSION: Large bilateral pleural effusions with associated partial consolidation in the lower lobes. No evidence of pulmonary emboli. Electronically Signed   By: Alcide CleverMark  Lukens M.D.   On: 03/02/2016 15:55     Current Medications:  . aspirin  81 mg Oral Q M,W,F  . carvedilol  12.5 mg Oral BID WC  . enoxaparin (LOVENOX) injection  40 mg Subcutaneous Q24H  . feeding supplement (ENSURE ENLIVE)  237 mL Oral BID BM  . furosemide  40 mg Intravenous BID  . hydrALAZINE  10 mg Oral 3 times per day  . potassium chloride  40 mEq Oral Once  . sodium chloride flush  3 mL Intravenous Q12H  . sodium chloride flush  3 mL Intravenous Q12H   . sodium chloride 1 mL/kg/hr (03/04/16 0849)    ASSESSMENT AND PLAN: Principal Problem:   Acute systolic and diastolic CHF (congestive heart failure), NYHA class 1 (HCC) Active Problems:   Hypertensive urgency   Hypokalemia   Bilateral pleural effusion   Acute combined systolic and diastolic heart failure (HCC)   Hypertensive heart disease with heart failure (HCC)   1. Acute on chronic systolic and diastolic CHF - Patient presented with lower extremity edema, presumed weight  gain, and shortness of breath. He has no previous medical care. Echo reveals LVEF of 35-40% and grade 2 diastolic dysfunction. Does not appear massively volume overloaded on exam but echo shows increased filling pressures and mildly elevated PA pressure.   Daily weights and strict I&O. Renal function stable. BP still elevated, but better.Coreg was increased yesterday, and his ACEI was stopped with plans to put him on Entresto. Valsartan is not on formulary, he will get one dose of Irbesartan today.   He is scheduled for left and right heart cath today.    Fasting lipids show LDL of 132, he will need high intensity statin.   Weight is down 2 pounds today, he  has had 675 out overnight. Cr. Has bumped to 1.4, will d/c IV Lasix.  Md to advise starting on po lasix.      Signed, Little Ishikawa , NP 8:53 AM 03/04/2016 Pager [phone]

## 2016-03-04 NOTE — Progress Notes (Signed)
Pre-op Cardiac Surgery  Carotid Findings:  Bilaterally- evidence of ICA stenosis-right side 60-79% and left side 80-99%. Vertebral artery flow is antegrade.   Upper Extremity Right Left  Brachial Pressures 125 Biphasic 139 Triphasic  Radial Waveforms  Biphasic Triphasic  Ulnar Waveforms  Biphasic Triphasic  Palmar Arch (Allen's Test) Radial abnormal normal   Findings:  Palmer Arch evaluation-Doppler waveforms abnormal right radial artery  with compression at rest. Right ulnar artery and the left radial artery and left ulnar remained normal with both compressions at rest.     Lower  Extremity Right Left  Dorsalis Pedis 140 Triphasic 74 Monophasic  Posterior Tibial 85 Biphasic 42 Monophasic  Ankle/Brachial Indices 1.01 0.53    Findings:  ABI's and Doppler waveforms right is normal within normal limits at rest. Left was difficult due to depth of vessel and the patient movement. Left indicated moderate arterial disease.  Called results to patients nurse@6 :55 pm

## 2016-03-04 NOTE — Progress Notes (Signed)
Site area: Right groin a 5 french arterial and a 7 french venous sheath was removed  Site Prior to Removal:  Level 0  Pressure Applied For 15 MINUTES    Minutes Beginning at 1215pm  Manual:   Yes.    Patient Status During Pull:  stable  Post Pull Groin Site:  Level 0  Post Pull Instructions Given:  Yes.    Post Pull Pulses Present:  Yes.    Dressing Applied:  Yes.    Comments:  VS remain stable during sheath pull

## 2016-03-04 NOTE — Progress Notes (Signed)
Patient ID: [name] [name], male   DOB: 16-Dec-1940, 75 y.o.   MRN: [id number]  [facility] PROGRESS NOTE  [name] [name] [id number] DOB: 16-Dec-1940 DOA: 03/02/2016 PCP: Joycelyn RuaMEYERS, STEPHEN, MD   Brief narrative:    Pt is 75 yo male, functional at baseline, presented from [place] office as his BP was > 220/120. Pt reports he has not seen doctor in years and currently reports only occasional exertional dyspnea and some weight gain but is not sure how many pounds. He denies fevers, chills, chest pain, no abd or urinary concerns, take no BP medications.  In ED, pt noted to be hemodynamically stable, VS notable for SBP 190's and blood work unremarkable. CXR with signs of CHF.  Ct chest with large B/l effusions  Assessment/Plan:    Active Problems: Hypertensive urgency - continue Coreg, stopped ACEI,  - add po hydralazine - Hold ARB dose due to AKI  Acute systolic and diastolic CHF - now on lasix 40 mg IV BID - EF 35-40% and grade 2 Diastolic dysfunction -negative 400cc only, CT chest with large bilateral effusions  AKI  -due to ACE(lisinopril and diuretics likely) -ACE stopped 3/21 and also hold am dose of scheduled ARB this am  Hypokalemia - replace  DVT prophylaxis - Lovenox SQ  Code Status: Full Family Communication:  plan of care discussed with the patient Disposition Plan: home in 1-2days  IV access:  Peripheral IV  Procedures and diagnostic studies:    Dg Chest 2 View 03/02/2016 Right greater than left basilar airspace disease could be due to atelectasis or pneumonia. Small pleural effusions are also seen, greater on the right.   Ct Angio Chest Pe W/cm &/or Wo Cm 03/02/2016  Large bilateral pleural effusions with associated partial consolidation in the lower lobes. No evidence of pulmonary emboli.   Medical Consultants:  Cardiology   Other Consultants:  None  IAnti-Infectives:   None  Zannie CoveJOSEPH,Alon Mazor, MD  [facility] Pager [phone]  If 7PM-7AM, please contact  night-coverage [email or web address] Password [email or web address] 03/04/2016, 8:24 AM   LOS: 2 days   HPI/Subjective: No events overnight. Less dyspnea.  Objective: Filed Vitals:   03/03/16 2013 03/03/16 2303 03/04/16 0103 03/04/16 0610  BP: 159/71 165/87 153/75 170/88  Pulse: 63 68 70 73  Temp: 98.5 F (36.9 C)  98.5 F (36.9 C) 98.1 F (36.7 C)  TempSrc: Oral  Oral Oral  Resp: 20  20 20   Height:      Weight:    64.638 kg (142 lb 8 oz)  SpO2: 93%  94% 95%    Intake/Output Summary (Last 24 hours) at 03/04/16 0824 Last data filed at 03/04/16 0109  Gross per 24 hour  Intake    480 ml  Output    675 ml  Net   -195 ml    Exam:   General:  Pt is alert, follows commands appropriately, not in acute distress  Cardiovascular: Regular rate and rhythm, S1/S2, no murmurs, no rubs, no gallops  Respiratory: Clear to auscultation bilaterally, crackles at bases   Abdomen: Soft, non tender, non distended, bowel sounds present, no guarding  Extremities: pulses DP and PT palpable bilaterally  Neuro: Grossly nonfocal  Data Reviewed: Basic Metabolic Panel:  Recent Labs Lab 03/02/16 1421 03/02/16 1429 03/03/16 0354 03/04/16 0400  NA 140 141 145 140  K 4.6 4.2 3.3* 3.1*  CL 105 103 107 109  CO2 20*  --  25 24  GLUCOSE 89 88 107* 116*  BUN 16 21* 15 20  CREATININE  0.97 0.90 0.99 1.40*  CALCIUM 10.3  --  10.7* 10.2   Liver Function Tests:  Recent Labs Lab 03/02/16 1421  AST 30  ALT 16*  ALKPHOS 69  BILITOT 1.7*  PROT 6.8  ALBUMIN 3.7   CBC:  Recent Labs Lab 03/02/16 1421 03/02/16 1429 03/03/16 0354  WBC 10.5  --  10.1  NEUTROABS 7.7  --   --   HGB 16.3 16.7 15.4  HCT 46.1 49.0 44.6  MCV 91.8  --  89.9  PLT 214  --  219   Cardiac Enzymes:  Recent Labs Lab 03/02/16 1421  TROPONINI 0.04*   Scheduled Meds: . aspirin  81 mg Oral Q M,W,F  . carvedilol  12.5 mg Oral BID WC  . enoxaparin (LOVENOX) injection  40 mg Subcutaneous Q24H  . feeding supplement (ENSURE ENLIVE)   237 mL Oral BID BM  . furosemide  40 mg Intravenous BID  . potassium chloride  40 mEq Oral Once  . sodium chloride flush  3 mL Intravenous Q12H  . sodium chloride flush  3 mL Intravenous Q12H   Continuous Infusions: . sodium chloride

## 2016-03-04 NOTE — Anesthesia Preprocedure Evaluation (Deleted)
Anesthesia Evaluation  Patient identified by MRN, date of birth, ID band  Reviewed: Allergy & Precautions, NPO status , Patient's Chart, lab work & pertinent test results, reviewed documented beta blocker date and time   History of Anesthesia Complications Negative for: history of anesthetic complications  Airway        Dental   Pulmonary former smoker,           Cardiovascular hypertension, Pt. on medications + CAD, +CHF and + DOE  + dysrhythmias Atrial Fibrillation    Went into a-fib during heart cath --> now on amiodarone and heparin gtt  Office BP >220-120 on 03/04/16  Pt underwent left and right heart catheterization showing chronic occlusion and RCA with three-vessel CAD. LVEDP was 20. Cardiac index was 1.8. T    Neuro/Psych negative neurological ROS  negative psych ROS   GI/Hepatic negative GI ROS, Neg liver ROS,   Endo/Other  negative endocrine ROS  Renal/GU negative Renal ROS     Musculoskeletal negative musculoskeletal ROS (+)   Abdominal   Peds  Hematology negative hematology ROS (+)   Anesthesia Other Findings   Reproductive/Obstetrics                            EKG 03/03/16: Sinus rhythm with marked sinus arrhythmia with occasional Premature ventricular complexes Left ventricular hypertrophy with repolarization abnormality Abnormal ECG Confirmed by Mariah Milling MD, TIMOTHY  BP Readings from Last 3 Encounters:  03/04/16 123/78   Lab Results  Component Value Date   WBC 10.1 03/03/2016   HGB 15.4 03/03/2016   HCT 44.6 03/03/2016   MCV 89.9 03/03/2016   PLT 219 03/03/2016     Chemistry      Component Value Date/Time   NA 140 03/04/2016 0400   K 3.1* 03/04/2016 0400   CL 109 03/04/2016 0400   CO2 24 03/04/2016 0400   BUN 20 03/04/2016 0400   CREATININE 1.40* 03/04/2016 0400      Component Value Date/Time   CALCIUM 10.2 03/04/2016 0400   ALKPHOS 69 03/02/2016 1421   AST  30 03/02/2016 1421   ALT 16* 03/02/2016 1421   BILITOT 1.7* 03/02/2016 1421     Lab Results  Component Value Date   TROPONINI 0.04* 03/02/2016    Lab Results  Component Value Date   INR 1.09 03/04/2016   Wt Readings from Last 3 Encounters:  03/04/16 142 lb 8 oz (64.638 kg)   Echo 03/03/16:  Study Conclusions  - Left ventricle: The cavity size was normal. There was mild concentric hypertrophy. Systolic function was moderately reduced. The estimated ejection fraction was in the range of 35% to 40%. Diffuse hypokinesis with akinesis of the basal and mid inferolateral walls. Features are consistent with a pseudonormal left ventricular filling pattern, with concomitant abnormal relaxation and increased filling pressure (grade 2 diastolic dysfunction). Doppler parameters are consistent with elevated ventricular end-diastolic filling pressure. - Aortic valve: Structurally normal valve. There was mild regurgitation. - Aorta: The aorta was normal, not dilated, and non-diseased. - Mitral valve: Structurally normal valve. There was moderate regurgitation. - Left atrium: The atrium was mildly dilated. - Right ventricle: The cavity size was normal. Wall thickness was normal. Systolic function was normal. - Right atrium: The atrium was normal in size. - Tricuspid valve: There was mild regurgitation. - Pulmonary arteries: Systolic pressure was mildly increased. PA peak pressure: 38 mm Hg (S). - Pericardium, extracardiac: The pericardium was normal in appearance. There  was a left pleural effusion.   Anesthesia Physical Anesthesia Plan Anesthesia Quick Evaluation

## 2016-03-04 NOTE — Progress Notes (Signed)
Day of Surgery Procedure(s) (LRB): Right/Left Heart Cath and Coronary Angiography (N/A) Subjective: Patient was admitted today from his primary care physician's office for uncontrolled hypertension. He had not been taking any medications. He had symptoms of heart failure and evidence of heart failure on exam and x-rays. Echocardiogram showed reduced LV function without significant MR-EF 35%. Troponin was minimally elevated. The patient subsequently underwent left and right heart catheterization showing chronic occlusion and RCA with three-vessel CAD. LVEDP was 20. Cardiac index was 1.8. The patient went into atrial fibrillation during the procedure. After cardiac catheterization the patient has had persistent hypertension. He denies any chest pain. Pre-CABG Doppler studies are pending and he has poor peripheral pulses in all extremities. The patient would benefit from multivessel bypass grafting for treatment of his severe three-vessel CAD with LV dysfunction -  however I feel that preoperative optimization will be important to reduce his risks and he should be on a beta blocker for a few days prior to surgery as well as treatment for his new onset atrial fibrillation. His preoperative Doppler studies are not yet available and will be important to help direct surgical strategy. Recommend continuing IV heparin and IV amiodarone and is surgical coronary revascularization later this hospitalization or potentially as an outpatient. Objective: Vital signs in last 24 hours: Temp:  [98.1 F (36.7 C)-98.5 F (36.9 C)] 98.1 F (36.7 C) (03/22 0610) Pulse Rate:  [0-140] 66 (03/22 1500) Cardiac Rhythm:  [-] Atrial fibrillation (03/22 1249) Resp:  [8-47] 23 (03/22 1230) BP: (123-219)/(71-191) 123/78 mmHg (03/22 1500) SpO2:  [0 %-98 %] 97 % (03/22 1230) Weight:  [142 lb 8 oz (64.638 kg)] 142 lb 8 oz (64.638 kg) (03/22 0610)  Hemodynamic parameters for last 24 hours:    Intake/Output from previous  day: 03/21 0701 - 03/22 0700 In: 720 [P.O.:720] Out: 675 [Urine:675] Intake/Output this shift:   Alert and comfortable but anxious No groin hematoma Breath sounds distant Nonpalpable pedal pulses, nonpalpable right radial pulse, 1+ left radial pulse Heart rate regular, no murmur, positive S4 gallop Neuro intact   Lab Results:  Recent Labs  03/02/16 1421 03/02/16 1429 03/03/16 0354  WBC 10.5  --  10.1  HGB 16.3 16.7 15.4  HCT 46.1 49.0 44.6  PLT 214  --  219   BMET:  Recent Labs  03/03/16 0354 03/04/16 0400  NA 145 140  K 3.3* 3.1*  CL 107 109  CO2 25 24  GLUCOSE 107* 116*  BUN 15 20  CREATININE 0.99 1.40*  CALCIUM 10.7* 10.2    PT/INR:  Recent Labs  03/04/16 0400  LABPROT 14.3  INR 1.09   ABG    Component Value Date/Time   PHART 7.471* 03/04/2016 1025   HCO3 24.0 03/04/2016 1109   TCO2 25 03/04/2016 1109   O2SAT 70.0 03/04/2016 1109   CBG (last 3)  No results for input(s): GLUCAP in the last 72 hours.  Assessment/Plan: S/P Procedure(s) (LRB): Right/Left Heart Cath and Coronary Angiography (N/A) Severe three-vessel coronary disease without symptoms of angina. Since patient presented with uncontrolled hypertension on no medications his surgery will be scheduled electively after his blood pressure is known to be under good control. We'll follow up results of pre-CABG Dopplers.   LOS: 2 days    Kathlee Nationseter Van Trigt III 03/04/2016

## 2016-03-04 NOTE — H&P (View-Only) (Signed)
    CARDIOLOGY CONSULT NOTE   Patient ID: [name] [name] MRN: [id number] DOB/AGE: 01/19/1941 74 y.o.  Admit date: 03/02/2016  Primary Physician   MEYERS, STEPHEN, MD Primary Cardiologist: New Reason for Consultation: CHF   HPI: [name] [name] is a 74 year old male with no previous medical history, as he has not sought medical care in many years and does not take any medications.  He presented to [facility] yesterday (03/02/16) with increasing SOB over the past week.  His BP was 220/130.  He did not have any chest pain, SOB or cough.  He does endorse some lower extremity edema that appeared about one week ago.He does not weight himself regularly, but does feel like he has put on some weight.    In the ED he continued to be hypertensive with SBP's in 190's.  He was placed on a BB and ACEI and given IV Lasix.  Cardiology was consulted for further management.  [name] [name] is mostly inactive, he does not exercise.  He says that on a typical day the most activity he gets is taking care of things around his home.  He does not report any shortness of breath with these activities.  He is able to lie flat when sleeping, does not use any pillows as he reports that he sleeps on his stomach.  In the past week he has had paroxysmal nocturnal dyspnea for 3 nights in a row.    When asked about his diet, he says for breakfast he has one cup of cocoa.  For lunch he will have a can of low sodium soup, and sometimes a can of sardines.  He says for dinner he eats french toast.  He denies drinking more than 2L of fluid a day.  He drinks one to two beers per week.     History reviewed. No pertinent past medical history.   History reviewed. No pertinent past surgical history.  No Known Allergies  I have reviewed the patient's current medications . aspirin  81 mg Oral Q M,W,F  . carvedilol  6.25 mg Oral BID WC  . enoxaparin (LOVENOX) injection  40 mg Subcutaneous Q24H  . feeding supplement (ENSURE  ENLIVE)  237 mL Oral BID BM  . furosemide  40 mg Intravenous BID  . lisinopril  20 mg Oral Daily  . potassium chloride  40 mEq Oral Once  . sodium chloride flush  3 mL Intravenous Q12H     sodium chloride, acetaminophen, hydrALAZINE, labetalol, ondansetron (ZOFRAN) IV, sodium chloride flush  Prior to Admission medications   Medication Sig Start Date End Date Taking? Authorizing Provider  Ascorbic Acid (VITAMIN C) 1000 MG tablet Take 1,000 mg by mouth daily.   Yes Historical Provider, MD  aspirin 81 MG tablet Take 81 mg by mouth every Monday, Wednesday, and Friday.   Yes Historical Provider, MD  magnesium 30 MG tablet Take 30 mg by mouth daily.   Yes Historical Provider, MD  Omega-3 Fatty Acids (FISH OIL) 1000 MG CAPS Take by mouth.   Yes Historical Provider, MD     Social History   Social History  . Marital Status: Married    Spouse Name: N/A  . Number of Children: N/A  . Years of Education: N/A   Occupational History  . Not on file.   Social History Main Topics  . Smoking status: Former Smoker  . Smokeless tobacco: Not on file  . Alcohol Use: Not on file  . Drug Use:   Not on file  . Sexual Activity: Not on file   Other Topics Concern  . Not on file   Social History Narrative  . No narrative on file    No family status information on file.   No family history on file.   ROS:  Full 14 point review of systems complete and found to be negative unless listed above.  Physical Exam: Blood pressure 142/71, pulse 76, temperature 97.7 F (36.5 C), temperature source Oral, resp. rate 20, height 5' 2" (1.575 m), weight 144 lb 8 oz (65.545 kg), SpO2 93 %.  General: Well developed, well nourished, male in no acute distress Head: Eyes PERRLA, No xanthomas.   Normocephalic and atraumatic, oropharynx without edema or exudate.  Lungs: CTA Heart: HRRR S1 S2, no rub/gallop, No murmur. Pulses are 2+ extrem.   Neck: No carotid bruits. No lymphadenopathy.  JVD. Abdomen: Bowel sounds  present, abdomen soft and non-tender without masses or hernias noted. Msk:  No spine or cva tenderness. No weakness, no joint deformities or effusions. Extremities: No clubbing or cyanosis.  edema.  Neuro: Alert and oriented X 3. No focal deficits noted. Psych:  Good affect, responds appropriately Skin: No rashes or lesions noted.  Labs:   Lab Results  Component Value Date   WBC 10.1 03/03/2016   HGB 15.4 03/03/2016   HCT 44.6 03/03/2016   MCV 89.9 03/03/2016   PLT 219 03/03/2016   No results for input(s): INR in the last 72 hours.  Recent Labs Lab 03/02/16 1421  03/03/16 0354  NA 140  < > 145  K 4.6  < > 3.3*  CL 105  < > 107  CO2 20*  --  25  BUN 16  < > 15  CREATININE 0.97  < > 0.99  CALCIUM 10.3  --  10.7*  PROT 6.8  --   --   BILITOT 1.7*  --   --   ALKPHOS 69  --   --   ALT 16*  --   --   AST 30  --   --   GLUCOSE 89  < > 107*  ALBUMIN 3.7  --   --   < > = values in this interval not displayed. No results found for: MG  Recent Labs  03/02/16 1421  TROPONINI 0.04*   No results for input(s): TROPIPOC in the last 72 hours. B NATRIURETIC PEPTIDE  Date/Time Value Ref Range Status  03/02/2016 02:21 PM 513.7* 0.0 - 100.0 pg/mL Final   No results found for: CHOL, HDL, LDLCALC, TRIG No results found for: DDIMER No results found for: LIPASE, AMYLASE TSH  Date/Time Value Ref Range Status  03/02/2016 08:27 PM 1.725 0.350 - 4.500 uIU/mL Final   No results found for: VITAMINB12, FOLATE, FERRITIN, TIBC, IRON, RETICCTPCT  Echo: 03/03/16 - Left ventricle: The cavity size was normal. There was mild  concentric hypertrophy. Systolic function was moderately reduced.  The estimated ejection fraction was in the range of 35% to 40%.  Diffuse hypokinesis with akinesis of the basal and mid  inferolateral walls. Features are consistent with a pseudonormal  left ventricular filling pattern, with concomitant abnormal  relaxation and increased filling pressure (grade 2  diastolic  dysfunction). Doppler parameters are consistent with elevated  ventricular end-diastolic filling pressure. - Aortic valve: Structurally normal valve. There was mild  regurgitation. - Aorta: The aorta was normal, not dilated, and non-diseased. - Mitral valve: Structurally normal valve. There was moderate  regurgitation. - Left atrium:   The atrium was mildly dilated. - Right ventricle: The cavity size was normal. Wall thickness was  normal. Systolic function was normal. - Right atrium: The atrium was normal in size. - Tricuspid valve: There was mild regurgitation. - Pulmonary arteries: Systolic pressure was mildly increased. PA  peak pressure: 38 mm Hg (S). - Pericardium, extracardiac: The pericardium was normal in  appearance. There was a left pleural effusion.  ECG: NSR with LVH   Radiology:  Dg Chest 2 View  03/02/2016  CLINICAL DATA:  Shortness of breath for 2 weeks.  Initial encounter. EXAM: CHEST  2 VIEW COMPARISON:  None. FINDINGS: The patient has small bilateral pleural effusions and basilar airspace disease, both worse on the right. Heart size is upper normal. No pneumothorax is identified. IMPRESSION: Right greater than left basilar airspace disease could be due to atelectasis or pneumonia. Small pleural effusions are also seen, greater on the right. Electronically Signed   By: Thomas  Dalessio M.D.   On: 03/02/2016 15:20   Ct Angio Chest Pe W/cm &/or Wo Cm  03/02/2016  CLINICAL DATA:  Shortness of breath for 1 week EXAM: CT ANGIOGRAPHY CHEST WITH CONTRAST TECHNIQUE: Multidetector CT imaging of the chest was performed using the standard protocol during bolus administration of intravenous contrast. Multiplanar CT image reconstructions and MIPs were obtained to evaluate the vascular anatomy. CONTRAST:  80mL OMNIPAQUE IOHEXOL 350 MG/ML SOLN COMPARISON:  Plain film from earlier in the same day FINDINGS: Lungs are well aerated bilaterally and demonstrate evidence of  bilateral large pleural effusions right greater than left. Associated partial consolidation of the lower lobes is noted bilaterally. The thoracic inlet is within normal limits. Calcific changes of the thoracic aorta are noted without aneurysmal dilatation or dissection. The pulmonary artery shows a normal branching pattern. No filling defects to suggest pulmonary emboli are identified. No significant hilar or mediastinal adenopathy is noted. Scanning into the upper abdomen reveals no acute abnormality. The osseous structures show degenerative change of the thoracic spine. No acute bony abnormality is noted. Review of the MIP images confirms the above findings. IMPRESSION: Large bilateral pleural effusions with associated partial consolidation in the lower lobes. No evidence of pulmonary emboli. Electronically Signed   By: Mark  Lukens M.D.   On: 03/02/2016 15:55    ASSESSMENT AND PLAN:    Active Problems:   CHF (congestive heart failure) (HCC)  1. Acute on chronic systolic and diastolic CHF - Patient presented with lower extremity edema, presumed weight gain, and shortness of breath.  He has no previous medical care.  Echo reveals LVEF of 35-40% and grade 2 diastolic dysfunction. Does not appear massively volume overloaded on exam but echo shows increased filling pressures and mildly elevated PA pressure.  Would continue IV Lasix again tomorrow BID. He is positive 240ml for today. Daily weights and strict I&O. Renal function stable.  BP still elevated, but better.  ACEI increased today.  Continue carvedilol as he seems to be tolerating it well. Would benefit from nutrition counseling.  He will need ischemic evaluation as his echo reflects diffuse hypokinesis with akinesis of the basal and mid inferolateral walls.   Signed: Erin E Smith, NP 03/03/2016 11:46 AM Pager [phone]  Co-Sign MD 

## 2016-03-04 NOTE — Interval H&P Note (Signed)
Cath Lab Visit (complete for each Cath Lab visit)  Clinical Evaluation Leading to the Procedure:   ACS: No.  Non-ACS:    Anginal Classification: CCS III  Anti-ischemic medical therapy: Minimal Therapy (1 class of medications)  Non-Invasive Test Results: No non-invasive testing performed  Prior CABG: No previous CABG      History and Physical Interval Note:  03/04/2016 10:02 AM  [name] [name]  has presented today for surgery, with the diagnosis of chf  The various methods of treatment have been discussed with the patient and family. After consideration of risks, benefits and other options for treatment, the patient has consented to  Procedure(s): Right/Left Heart Cath and Coronary Angiography (N/A) as a surgical intervention .  The patient's history has been reviewed, patient examined, no change in status, stable for surgery.  I have reviewed the patient's chart and labs.  Questions were answered to the patient's satisfaction.     KELLY,THOMAS A

## 2016-03-05 ENCOUNTER — Other Ambulatory Visit: Payer: Self-pay

## 2016-03-05 ENCOUNTER — Encounter (HOSPITAL_COMMUNITY)
Admission: EM | Disposition: A | Discharge status: Home Health | Payer: Self-pay | Source: Home / Self Care | Attending: Cardiothoracic Surgery

## 2016-03-05 ENCOUNTER — Encounter (HOSPITAL_COMMUNITY): Payer: Self-pay | Admitting: Cardiovascular Disease

## 2016-03-05 DIAGNOSIS — J90 Pleural effusion, not elsewhere classified: Secondary | ICD-10-CM

## 2016-03-05 DIAGNOSIS — R0989 Other specified symptoms and signs involving the circulatory and respiratory systems: Secondary | ICD-10-CM

## 2016-03-05 DIAGNOSIS — I739 Peripheral vascular disease, unspecified: Secondary | ICD-10-CM

## 2016-03-05 DIAGNOSIS — I6523 Occlusion and stenosis of bilateral carotid arteries: Secondary | ICD-10-CM

## 2016-03-05 DIAGNOSIS — I2510 Atherosclerotic heart disease of native coronary artery without angina pectoris: Secondary | ICD-10-CM | POA: Insufficient documentation

## 2016-03-05 DIAGNOSIS — I779 Disorder of arteries and arterioles, unspecified: Secondary | ICD-10-CM

## 2016-03-05 DIAGNOSIS — I5031 Acute diastolic (congestive) heart failure: Secondary | ICD-10-CM

## 2016-03-05 LAB — HEMOGLOBIN A1C
Hgb A1c MFr Bld: 5.4 % (ref 4.8–5.6)
Mean Plasma Glucose: 108 mg/dL

## 2016-03-05 LAB — BASIC METABOLIC PANEL
Anion gap: 8 (ref 5–15)
BUN: 19 mg/dL (ref 6–20)
CO2: 22 mmol/L (ref 22–32)
CREATININE: 1.25 mg/dL — AB (ref 0.61–1.24)
Calcium: 9.8 mg/dL (ref 8.9–10.3)
Chloride: 112 mmol/L — ABNORMAL HIGH (ref 101–111)
GFR calc Af Amer: >60 mL/min (ref >60)
GFR, EST NON AFRICAN AMERICAN: 55 mL/min — AB (ref >60)
Glucose, Bld: 105 mg/dL — ABNORMAL HIGH (ref 65–99)
Potassium: 3.9 mmol/L (ref 3.5–5.1)
SODIUM: 142 mmol/L (ref 135–145)

## 2016-03-05 LAB — CBC
HCT: 39.9 % (ref 39.0–52.0)
Hemoglobin: 13.3 g/dL (ref 13.0–17.0)
MCH: 31.1 pg (ref 26.0–34.0)
MCHC: 33.3 g/dL (ref 30.0–36.0)
MCV: 93.2 fL (ref 78.0–100.0)
Platelets: 186 10*3/uL (ref 150–400)
RBC: 4.28 MIL/uL (ref 4.22–5.81)
RDW: 14.3 % (ref 11.5–15.5)
WBC: 10.4 10*3/uL (ref 4.0–10.5)

## 2016-03-05 LAB — HEPARIN LEVEL (UNFRACTIONATED): HEPARIN UNFRACTIONATED: 0.3 [IU]/mL (ref 0.30–0.70)

## 2016-03-05 LAB — APTT: APTT: 37 s (ref 24–37)

## 2016-03-05 SURGERY — CORONARY ARTERY BYPASS GRAFTING (CABG)
Anesthesia: General | Site: Chest

## 2016-03-05 MED ORDER — AMIODARONE HCL 200 MG PO TABS
400.0000 mg | ORAL_TABLET | Freq: Two times a day (BID) | ORAL | Status: DC
  Administered 2016-03-05 – 2016-03-09 (×10): 400 mg via ORAL
  Filled 2016-03-05 (×11): qty 2

## 2016-03-05 MED ORDER — FUROSEMIDE 40 MG PO TABS
40.0000 mg | ORAL_TABLET | Freq: Every day | ORAL | Status: DC
  Administered 2016-03-06 – 2016-03-07 (×2): 40 mg via ORAL
  Filled 2016-03-05 (×2): qty 1

## 2016-03-05 MED ORDER — AMIODARONE HCL 200 MG PO TABS: 200.0000 mg | ORAL_TABLET | Freq: Every day | ORAL | Status: DC

## 2016-03-05 MED ORDER — HEPARIN BOLUS VIA INFUSION
3000.0000 [IU] | Freq: Once | INTRAVENOUS | Status: AC
  Administered 2016-03-05: 3000 [IU] via INTRAVENOUS
  Filled 2016-03-05: qty 3000

## 2016-03-05 NOTE — Progress Notes (Signed)
ANTICOAGULATION CONSULT NOTE  Pharmacy Consult for heparin Indication: atrial fibrillation , possible CABG   No Known Allergies  Patient Measurements: Height: 5\' 2"  (157.5 cm) Weight: 142 lb 4.5 oz (64.537 kg) (Scale B) IBW/kg (Calculated) : 54.6 Heparin Dosing Weight: 65.6kg  Vital Signs: Temp: 97.9 F (36.6 C) (03/23 1305) Temp Source: Oral (03/23 1305) BP: 140/98 mmHg (03/23 1627) Pulse Rate: 96 (03/23 1627)  Labs:  Recent Labs  03/03/16 0354 03/04/16 0400 03/05/16 0540 03/05/16 1632  HGB 15.4  --  13.3  --   HCT 44.6  --  39.9  --   PLT 219  --  186  --   APTT  --   --  37  --   LABPROT  --  14.3  --   --   INR  --  1.09  --   --   HEPARINUNFRC  --   --  <0.10* 0.30  CREATININE 0.99 1.40* 1.25*  --     Estimated Creatinine Clearance: 39.4 mL/min (by C-G formula based on Cr of 1.25).  Assessment: [age] YOM s/p cath on 3/22 with sig vessel disease, went into AFib during procedure. Plan for CABG 3/28. Continue on IV heparin, with heparin level 0.3, low-end therapeutic on 1050 units/hr  Goal of Therapy:  Heparin level 0.3-0.7 units/ml Monitor platelets by anticoagulation protocol: Yes   Plan:  - Increase heparin rate to 1150 units/hr - Confirmatory heparin level in AM - daily HL and CBC - follow plans for CABG and long term Anticoagulation  Bayard HuggerMei Afsa Meany, PharmD, BCPS  Clinical Pharmacist  Pager: [phone]   03/05/2016 5:47 PM

## 2016-03-05 NOTE — Progress Notes (Signed)
Patient Name: [name] [name] Date of Encounter: 03/05/2016  Principal Problem:   Acute systolic and diastolic CHF (congestive heart failure), NYHA class 1 (HCC) Active Problems:   Hypertensive urgency   Hypokalemia   Bilateral pleural effusion   Acute combined systolic and diastolic heart failure (HCC)   Hypertensive heart disease with heart failure (HCC)   Essential hypertension   CAD in native artery   Bruit    Patient Profile: [name] is a 75 year old male who has not sought medical care in many years who presented to ED on 03/02/16 with SOB, hypertensive with BP of 220/130, and lower extremity edema. His echo shows EF of 35% with diffuse hypokinesis with akinesis of the basal and midinferolateral walls. He underwent left heart cath on 03/04/16, he has severe multivessel disease, LV 35% by cath, and moderate elevation on right heart pressures.  He went into atrial fibrillation during the procedure and was started on Amiodarone and heparin drips.   SUBJECTIVE: Feels well, No SOB or chest pain.  Denies palpitations.    OBJECTIVE Filed Vitals:   03/04/16 2352 03/05/16 0023 03/05/16 0512 03/05/16 0527  BP: 112/72 113/73 116/78 110/70  Pulse: 76 72 70   Temp: 97.8 F (36.6 C) 97.6 F (36.4 C) 97.5 F (36.4 C)   TempSrc: Oral Oral Oral   Resp: 16 16 17    Height:      Weight:   142 lb 4.5 oz (64.537 kg)   SpO2: 100% 100% 100%     Intake/Output Summary (Last 24 hours) at 03/05/16 0946 Last data filed at 03/05/16 0600  Gross per 24 hour  Intake 4041.7 ml  Output    400 ml  Net 3641.7 ml   Filed Weights   03/03/16 1803 03/04/16 0610 03/05/16 0512  Weight: 144 lb 8 oz (65.545 kg) 142 lb 8 oz (64.638 kg) 142 lb 4.5 oz (64.537 kg)    PHYSICAL EXAM General: Well developed, well nourished, male in no acute distress. Head: Normocephalic, atraumatic.  Neck: Supple without bruits, No JVD. Lungs:  Resp regular and unlabored, CTA. Heart: Irregular rhythm,  S1, S2, no S3,  S4, or murmur; no rub. Abdomen: Soft, non-tender, non-distended, BS + x 4.  Extremities: No clubbing, cyanosis, No edema.  Neuro: Alert and oriented X 3. Moves all extremities spontaneously. Psych: Normal affect.  LABS: CBC: Recent Labs  03/02/16 1421  03/03/16 0354 03/05/16 0540  WBC 10.5  --  10.1 10.4  NEUTROABS 7.7  --   --   --   HGB 16.3  < > 15.4 13.3  HCT 46.1  < > 44.6 39.9  MCV 91.8  --  89.9 93.2  PLT 214  --  219 186  < > = values in this interval not displayed. INR: Recent Labs  03/04/16 0400  INR 1.09   Basic Metabolic Panel: Recent Labs  03/04/16 0400 03/05/16 0540  NA 140 142  K 3.1* 3.9  CL 109 112*  CO2 24 22  GLUCOSE 116* 105*  BUN 20 19  CREATININE 1.40* 1.25*  CALCIUM 10.2 9.8   Liver Function Tests: Recent Labs  03/02/16 1421  AST 30  ALT 16*  ALKPHOS 69  BILITOT 1.7*  PROT 6.8  ALBUMIN 3.7   Cardiac Enzymes: Recent Labs  03/02/16 1421  TROPONINI 0.04*   BNP:  B NATRIURETIC PEPTIDE  Date/Time Value Ref Range Status  03/02/2016 02:21 PM 513.7* 0.0 - 100.0 pg/mL Final   Hemoglobin A1C:  Recent Labs  03/04/16 1606  HGBA1C 5.4   Fasting Lipid Panel: Recent Labs  03/04/16 0400  CHOL 186  HDL 41  LDLCALC 132*  TRIG 66  CHOLHDL 4.5   Thyroid Function Tests: Recent Labs  03/04/16 1606  TSH 0.666   TELE: Afib, rates <100     Radiology/Studies: No results found.   Current Medications:  . aspirin  81 mg Oral Daily  . atorvastatin  80 mg Oral q1800  . carvedilol  12.5 mg Oral BID WC  . feeding supplement (ENSURE ENLIVE)  237 mL Oral BID BM  . hydrALAZINE  10 mg Oral 3 times per day  . sodium chloride flush  3 mL Intravenous Q12H  . sodium chloride flush  3 mL Intravenous Q12H   . amiodarone 30 mg/hr (03/05/16 0841)  . heparin 1,050 Units/hr (03/05/16 0842)    ASSESSMENT AND PLAN: Principal Problem:   Acute systolic and diastolic CHF (congestive heart failure), NYHA class 1 (HCC) Active Problems:    Hypertensive urgency   Hypokalemia   Bilateral pleural effusion   Acute combined systolic and diastolic heart failure (HCC)   Hypertensive heart disease with heart failure (HCC)   Essential hypertension   CAD in native artery   Bruit  1. Acute on chronic systolic and diastolic CHF - Patient presented with lower extremity edema, presumed weight gain, and shortness of breath. He has no previous medical care. Echo reveals LVEF of 35-40% and grade 2 diastolic dysfunction.  He has severe multivessel disease and CVTS has been consulted.   Daily weights and strict I&O. Renal function stable. BP is within desirable range. He is on BB, hydralazine.  No ACE/ARB due to renal function. Weight is stable from yesterday, no gain or loss.  He does not have any output charted for last pm.   2. CAD - Seen by CVTS for multi-vessel disease for consideration for CABG. CABG planned for Tuesday 03/10/16.  Per CVTS he will need preoperative optimization with BB therapy and treatment for Afib.  Dopplers done. Fasting lipids show LDL of 132, he has started high intensity statin.      Signed, Little Ishikawa , NP 9:46 AM 03/05/2016 Pager [phone]

## 2016-03-05 NOTE — Progress Notes (Signed)
ANTICOAGULATION CONSULT NOTE  Pharmacy Consult for heparin Indication: atrial fibrillation , possible CABG   No Known Allergies  Patient Measurements: Height: 5\' 2"  (157.5 cm) Weight: 142 lb 4.5 oz (64.537 kg) (Scale B) IBW/kg (Calculated) : 54.6 Heparin Dosing Weight: 65.6kg  Vital Signs: Temp: 97.5 F (36.4 C) (03/23 0512) Temp Source: Oral (03/23 0512) BP: 110/70 mmHg (03/23 0527) Pulse Rate: 70 (03/23 0512)  Labs:  Recent Labs  03/02/16 1421 03/02/16 1429 03/03/16 0354 03/04/16 0400 03/05/16 0540  HGB 16.3 16.7 15.4  --  13.3  HCT 46.1 49.0 44.6  --  39.9  PLT 214  --  219  --  186  APTT  --   --   --   --  37  LABPROT  --   --   --  14.3  --   INR  --   --   --  1.09  --   HEPARINUNFRC  --   --   --   --  <0.10*  CREATININE 0.97 0.90 0.99 1.40* 1.25*  TROPONINI 0.04*  --   --   --   --     Estimated Creatinine Clearance: 39.4 mL/min (by C-G formula based on Cr of 1.25).  Assessment: [age] YOM s/p cath on 3/22 with sig vessel disease, went into AFib during procedure. Per notes, patient has not had medical care in a long time, he did not report taking anticoagulation to medication history technician on admission.  Started on heparin 8h post sheath removal yesterday. First level this morning undetectable- no issues with line per RN, running at appropriate rate.  Hgb 13.3, plts 186- both with drops, no bleeding noted.  Goal of Therapy:  Heparin level 0.3-0.7 units/ml Monitor platelets by anticoagulation protocol: Yes   Plan:  -heparin bolus with 3000 units IV x1, then increase rate to 1050 units/hr -HL in 8h (@ 1700) -daily HL and CBC -follow plans for CABG and long term Anticoagulation  Ashyla Luth D. Walterine Amodei, PharmD, BCPS Clinical Pharmacist Pager: [phone] 03/05/2016 8:39 AM

## 2016-03-05 NOTE — Progress Notes (Signed)
At 1130 introduced self to pt as incoming nurse until 7p tonight.  Call bell at reach.  Instructed to call for assistance as needed.  Lamin Chandley,RN.

## 2016-03-05 NOTE — Consult Note (Signed)
   [facility] CM Inpatient Consult   03/05/2016  [name] [name] [birth year] [id number] Patient was screened for post hospital follow up with [facility].    Met with the patient regarding the benefits of [facility] services.  Explained that [facility] is a covered benefit of [employer] insurance. Patient endorses Dr. Orpah Melter as his primary care provider.  Review information for [facility] and a bochure was provided with contact information.  Explained that [facility] does not interfere with or replace any services arranged by the inpatient care management staff.  Patient states that he anticipates home health services. He states he would like to talk with his wife about if they needed anything else. He states that he is still scheduled for surgery early next week on Tuesday and he is not sure what will be done after that.  Encouraged him to call the number provided if he feels that [facility] can assist him with monitoring.  Will follow for progress and needs.  Please contact, if the patient needs changes: Natividad Brood, RN BSN [facility] Liaison  [phone] business mobile phone Toll free office [phone]

## 2016-03-05 NOTE — Progress Notes (Addendum)
Patient ID: [name] [name], male   DOB: [birth year], 75 y.o.   MRN: [id number]  [facility] PROGRESS NOTE  [name] Deisibor Shifflett WUJ:[id number] DOB: [birth year] DOA: 03/02/2016 PCP: Joycelyn RuaMEYERS, STEPHEN, MD   Brief narrative:    Pt is 75 yo male, functional at baseline, presented from [facility] office as his BP was > 220/120. Pt reports he has not seen doctor in years and currently reports only occasional exertional dyspnea and some weight gain but is not sure how many pounds. He denies fevers, chills, chest pain, no abd or urinary concerns, take no BP medications.  In ED, pt noted to be hemodynamically stable, VS notable for SBP 190's and blood work unremarkable. CXR with signs of CHF.  Ct chest with large B/l effusions  Assessment/Plan:    Active Problems:  CAD and L carotid artery disease -[facility] -CVTS Dr.Van Trigt consulting -80% L carotid artery disease: VVS Dr.Dickson consulting  P.Afib -went into Afib during cath -still in Afib now on IV heparin and IV amiodarone now per cardiology  Hypertensive urgency - continue Coreg, stopped ACEI,  - added po hydralazine - Hold ARB dose due to AKI  Acute systolic and diastolic CHF - was on lasix 40 mg IV BID - EF 35-40% and grade 2 Diastolic dysfunction -negative 400cc only, CT chest with large bilateral effusions -diuretics held due to AKI, stop IVF   AKI  -due to ACE(lisinopril and diuretics likely) -ACE stopped 3/21 and also held ARB 3/22  Hypokalemia - replace  DVT prophylaxis - Lovenox SQ  Code Status: Full Family Communication:  plan of care discussed with the patient Disposition Plan: home when stable and depending on CABG timing  IV access:  Peripheral IV  Procedures and diagnostic studies:    Dg Chest 2 View 03/02/2016 Right greater than left basilar airspace disease could be due to atelectasis or pneumonia. Small pleural effusions are also seen, greater on the right.   Ct Angio Chest Pe W/cm &/or Wo  Cm 03/02/2016  Large bilateral pleural effusions with associated partial consolidation in the lower lobes. No evidence of pulmonary emboli.   Medical Consultants:  Cardiology   Other Consultants:  None  IAnti-Infectives:   None  Zannie CoveJOSEPH,Dasiah Hooley, MD  [facility] Pager [phone]  If 7PM-7AM, please contact night-coverage [email or web address] Password [email or web address] 03/05/2016, 11:45 AM   LOS: 3 days   HPI/Subjective: No events overnight. Breathing ok, awaiting decision regarding timing of CABG  Objective: Filed Vitals:   03/04/16 2352 03/05/16 0023 03/05/16 0512 03/05/16 0527  BP: 112/72 113/73 116/78 110/70  Pulse: 76 72 70   Temp: 97.8 F (36.6 C) 97.6 F (36.4 C) 97.5 F (36.4 C)   TempSrc: Oral Oral Oral   Resp: 16 16 17    Height:      Weight:   64.537 kg (142 lb 4.5 oz)   SpO2: 100% 100% 100%     Intake/Output Summary (Last 24 hours) at 03/05/16 1145 Last data filed at 03/05/16 0600  Gross per 24 hour  Intake 4041.7 ml  Output    400 ml  Net 3641.7 ml    Exam:   General:  Pt is alert, follows commands appropriately, not in acute distress  Cardiovascular: Regular rate and rhythm, S1/S2, no murmurs, no rubs, no gallops  Respiratory: Clear to auscultation bilaterally, crackles at bases   Abdomen: Soft, non tender, non distended, bowel sounds present, no guarding  Extremities: pulses DP and PT palpable bilaterally  Neuro: Grossly nonfocal  Data Reviewed:  Basic Metabolic Panel:  Recent Labs Lab 03/02/16 1421 03/02/16 1429 03/03/16 0354 03/04/16 0400 03/05/16 0540  NA 140 141 145 140 142  K 4.6 4.2 3.3* 3.1* 3.9  CL 105 103 107 109 112*  CO2 20*  --  GLUCOSE 89 88 107* 116* 105*  BUN 16 21* CREATININE 0.97 0.90 0.99 1.40* 1.25*  CALCIUM 10.3  --  10.7* 10.2 9.8   Liver Function Tests:  Recent Labs Lab 03/02/16 1421  AST 30  ALT 16*  ALKPHOS 69  BILITOT 1.7*  PROT 6.8  ALBUMIN 3.7   CBC:  Recent Labs Lab 03/02/16 1421  03/02/16 1429 03/03/16 0354 03/05/16 0540  WBC 10.5  --  10.1 10.4  NEUTROABS 7.7  --   --   --   HGB 16.3 16.7 15.4 13.3  HCT 46.1 49.0 44.6 39.9  MCV 91.8  --  89.9 93.2  PLT 214  --  219 186   Cardiac Enzymes:  Recent Labs Lab 03/02/16 1421  TROPONINI 0.04*   Scheduled Meds: . aspirin  81 mg Oral Daily  . atorvastatin  80 mg Oral q1800  . carvedilol  12.5 mg Oral BID WC  . feeding supplement (ENSURE ENLIVE)  237 mL Oral BID BM  . hydrALAZINE  10 mg Oral 3 times per day  . sodium chloride flush  3 mL Intravenous Q12H  . sodium chloride flush  3 mL Intravenous Q12H   Continuous Infusions: . amiodarone 30 mg/hr (03/05/16 0841)  . heparin 1,050 Units/hr (03/05/16 0842)

## 2016-03-05 NOTE — Consult Note (Signed)
[facility]  Patient name: [name] [name] MRN: [id number] DOB: [birth year] Sex: male  REASON FOR CONSULT: Bilateral carotid stenoses and coronary artery disease. Consult is from Dr. Kathlee NationsPeter Van Trigt  HPI: [name] [name] is a 75 y.o. male, who was admitted with some shortness of breath. Also according to the records he was at his primary care physician's office with blood pressure 220/120. His workup included cardiac catheterization which showed severe three-vessel coronary artery disease. Coronary revascularization was recommended. His preoperative workup included ABIs and a carotid duplex scan. Carotid duplex scan showed bilateral carotid disease and vascular surgery was consult.  The patient is right-handed. He denies any history of stroke, TIAs, expressive or receptive aphasia, or amaurosis fugax. He was on aspirin prior to admission.  He denies any history of claudication although his activity may be limited. He denies any history of rest pain or nonhealing ulcers.  His risk factors for peripheral vascular disease include hypertension and remote history of tobacco use. He denies any history of diabetes, hypercholesterolemia, or family history of premature cardiovascular disease.  PAST MEDICAL HISTORY: He denies any history of diabetes, hypercholesterolemia, history of previous myocardial infarction or history of congestive heart failure. He denies any history of COPD. He does have high blood pressure.   SOCIAL HISTORY: The patient is married. He has 2 children. One of his voice lives in [place] and one of his voice lives in [place]. He quit tobacco 37 years ago.  SOCIAL HISTORY: Social History   Social History  . Marital Status: Married    Spouse Name: N/A  . Number of Children: N/A  . Years of Education: N/A   Occupational History  . Not on file.   Social History Main Topics  . Smoking status: Former Games developermoker  . Smokeless tobacco: Not on file  .  Alcohol Use: Not on file  . Drug Use: Not on file  . Sexual Activity: Not on file   Other Topics Concern  . Not on file   Social History Narrative    No Known Allergies  Current Facility-Administered Medications  Medication Dose Route Frequency Provider Last Rate Last Dose  . 0.9 %  sodium chloride infusion  250 mL Intravenous PRN Dorothea OgleIskra M Myers, MD      . 0.9 %  sodium chloride infusion  250 mL Intravenous PRN Lennette Biharihomas A Kelly, MD      . acetaminophen (TYLENOL) tablet 650 mg  650 mg Oral Q4H PRN Lennette Biharihomas A Kelly, MD      . amiodarone (NEXTERONE PREMIX) 360 MG/200ML (1.8 mg/mL) IV infusion  30 mg/hr Intravenous Continuous Chilton Siiffany [place], MD 16.7 mL/hr at 03/05/16 0841 30 mg/hr at 03/05/16 0841  . aspirin chewable tablet 81 mg  81 mg Oral Daily Zannie CovePreetha Joseph, MD   81 mg at 03/05/16 0932  . atorvastatin (LIPITOR) tablet 80 mg  80 mg Oral q1800 Chilton Siiffany [place], MD   80 mg at 03/04/16 1800  . carvedilol (COREG) tablet 12.5 mg  12.5 mg Oral BID WC Chilton Siiffany Vandiver, MD   12.5 mg at 03/05/16 0528  . feeding supplement (ENSURE ENLIVE) (ENSURE ENLIVE) liquid 237 mL  237 mL Oral BID BM Dorothea OgleIskra M Myers, MD   237 mL at 03/03/16 1500  . heparin ADULT infusion 100 units/mL (25000 units/250 mL)  1,050 Units/hr Intravenous Continuous Lauren D Bajbus, RPH 10.5 mL/hr at 03/05/16 0842 1,050 Units/hr at 03/05/16 0842  . hydrALAZINE (APRESOLINE) injection 10 mg  10 mg Intravenous Q4H  PRN Dorothea Ogle, MD   10 mg at 03/03/16 2305  . hydrALAZINE (APRESOLINE) tablet 10 mg  10 mg Oral 3 times per day Zannie Cove, MD   10 mg at 03/05/16 0528  . labetalol (NORMODYNE,TRANDATE) injection 10 mg  10 mg Intravenous Q2H PRN Dorothea Ogle, MD      . ondansetron [facility]) injection 4 mg  4 mg Intravenous Q6H PRN Lennette Bihari, MD      . sodium chloride flush (NS) 0.9 % injection 3 mL  3 mL Intravenous Q12H Dorothea Ogle, MD   3 mL at 03/03/16 1000  . sodium chloride flush (NS) 0.9 % injection 3 mL  3 mL Intravenous PRN  Dorothea Ogle, MD      . sodium chloride flush (NS) 0.9 % injection 3 mL  3 mL Intravenous Q12H Lennette Bihari, MD   0 mL at 03/04/16 1545  . sodium chloride flush (NS) 0.9 % injection 3 mL  3 mL Intravenous PRN Lennette Bihari, MD      . temazepam (RESTORIL) capsule 15 mg  15 mg Oral Once PRN Kerin Perna, MD        REVIEW OF SYSTEMS:   denotes positive finding,  denotes negative finding Cardiac  Comments:  Chest pain or chest pressure:    Shortness of breath upon exertion: X   Short of breath when lying flat:    Irregular heart rhythm:        Vascular    Pain in calf, thigh, or hip brought on by ambulation:    Pain in feet at night that wakes you up from your sleep:     Blood clot in your veins:    Leg swelling:         Pulmonary    Oxygen at home:    Productive cough:     Wheezing:         Neurologic    Sudden weakness in arms or legs:     Sudden numbness in arms or legs:     Sudden onset of difficulty speaking or slurred speech:    Temporary loss of vision in one eye:     Problems with dizziness:         Gastrointestinal    Blood in stool:     Vomited blood:         Genitourinary    Burning when urinating:     Blood in urine:        Psychiatric    Major depression:         Hematologic    Bleeding problems:    Problems with blood clotting too easily:        Skin    Rashes or ulcers:        Constitutional    Fever or chills:      PHYSICAL EXAM: Filed Vitals:   03/04/16 2352 03/05/16 0023 03/05/16 0512 03/05/16 0527  BP: 112/72 113/73 116/78 110/70  Pulse: 76 72 70   Temp: 97.8 F (36.6 C) 97.6 F (36.4 C) 97.5 F (36.4 C)   TempSrc: Oral Oral Oral   Resp: Height:      Weight:   142 lb 4.5 oz (64.537 kg)   SpO2: 100% 100% 100%     GENERAL: The patient is a well-nourished male, in no acute distress. The vital signs are documented above. CARDIAC: There is a regular rate and rhythm.  VASCULAR: he has a left carotid bruit. On  the right side, he has a palpable femoral, popliteal, dorsalis pedis pulse. I cannot palpate a posterior tibial pulse. On the left side, he has a diminished femoral pulses. I cannot palpate a popliteal, dorsalis pedis, or posterior tibial pulse. PULMONARY: There is good air exchange bilaterally without wheezing or rales. ABDOMEN: Soft and non-tender with normal pitched bowel sounds. I do not appreciate an abdominal aortic aneurysm. MUSCULOSKELETAL: There are no major deformities or cyanosis. NEUROLOGIC: No focal weakness or paresthesias are detected. SKIN: There are no ulcers or rashes noted. PSYCHIATRIC: The patient has a normal affect.  DATA:   CAROTID DUPLEX: Shari Prows apparently interpreted his carotid duplex scan.   On the left side, he has an approximately 80% carotid stenosis. The peak systolic velocity in the left is 310 cm/s which would suggest a greater than 80% left carotid stenosis. The end-diastolic velocity is 102 cm/s which would suggest that this is just under 80%. However the images show a significant plaque here I suspect this is 80%.  On the right side he has a 60-79% carotid stenosis.  ARTERIAL DOPPLER STUDY:  I have independently interpreted his arterial Doppler study.  On the right side, he has a triphasic dorsalis pedis signal and a biphasic posterior tibial signal. ABI is 100%.  On the left side, he has a monophasic dorsalis pedis signal and a monophasic posterior tibial signal. ABI is 53%.  MEDICAL ISSUES:  80% ASYMPTOMATIC LEFT CAROTID STENOSIS: The patient presents with three-vessel coronary artery disease and is scheduled for CABG. Given the 80% left carotid stenosis, I would recommend left carotid endarterectomy combined with CABG in order to lower his risk of perioperative and also future stroke. He is on aspirin and is on a statin. He is not a smoker. I have reviewed the indications for carotid endarterectomy, that is to lower the risk of future stroke. I have also  reviewed the potential complications of surgery, including but not limited to: bleeding, stroke (perioperative risk 1-2%), MI, nerve injury of other unpredictable medical problems. All of the patients questions were answered and they are agreeable to proceed with surgery. If his surgery is scheduled for next Tuesday I will see if my partner Dr. Fabienne Bruns could potentially do the surgery.  PERIPHERAL VASCULAR DISEASE: He does have evidence of moderate multilevel arterial occlusive disease on the left. However he is asymptomatic. Regardless, with an ABI 53% on the left I would recommend taking vein from the right leg.  Waverly Ferrari [facility] [place] [facility]: [phone]

## 2016-03-05 NOTE — Progress Notes (Addendum)
CARDIAC REHAB PHASE I   PRE:  Rate/Rhythm: 100 afib    BP: sitting 140/80    SaO2: 95 RA  MODE:  Ambulation: 380 ft   POST:  Rate/Rhythm: 105 afib    BP: sitting 150/80     SaO2: 97 RA  Pt steady. Rest x2 for SOB ( I asked him to). No major c/o. HR stable with afib. Gave pt videos to watch. Will f/u for more education. [id number]  [name] MassonRandi Kristan Santos [name] CES, ACSM 03/05/2016 3:46 PM

## 2016-03-05 NOTE — Progress Notes (Signed)
At 1130 introduced self to pt as incoming nurse until 7p to night.  Call bell at reach.  Instructed to call for assistance as needed.  Verbalized understanding.  Khalil Szczepanik,RN.

## 2016-03-05 NOTE — Consult Note (Signed)
[address]       [place] [address]             [phone]        [name] [name] [facility] Medical Record #[id number] Date of Birth: 08/10/1941  Referring: No ref. provider found Primary Care: Joycelyn Rua, MD  Chief Complaint:    Chief Complaint  Patient presents with  . Shortness of Breath   patient examined, coronary angiogram, right heart cath, chest x-ray, transthoracic echocardiogram and pre-CABG Dopplers all personally reviewed and counseled with patient  History of Present Illness:     75 year old Caucasian male with minimal previous medical history on no medications presented to his primary care physician with weakness, shortness of breath with exertion, and was found to be with severe hypertension. He starting having paroxysmal nocturnal dyspnea and went to see his primary care physician. He was sent to the [facility] for severe hypertension and further medical management of his symptoms of CHF. He subsequently underwent echocardiogram, cardiac catheterization, and chest x-ray all consistent with ischemic cardiomyopathy and class for CHF. He has significant peripheral vascular disease and was Cath via femoral artery. Pre-CABG Dopplers show severe left carotid stenosis greater than 80% and moderate right carotid stenosis-both appear to be asymptomatic and vascular surgery consultation is pending.  The patient was noted to be in atrial fibrillation at the end of cardiac catheterization. He has never been on anticoagulation. He is now stable with improved symptoms of shortness of breath after Lasix diuresis and blood pressure control. He is on a heparin drip for his atrial fibrillation.  Current Activity/ Functional Status: The patient lives with his wife who is in poor health and has a sedentary lifestyle. He is retired from [employer] work. He stopped smoking several years ago and drinks beer occasionally.   Zubrod Score: At the time of surgery this  patient's most appropriate activity status/level should be described as:     0    Normal activity, no symptoms     1    Restricted in physical strenuous activity but ambulatory, able to do out light work     2    Ambulatory and capable of self care, unable to do work activities, up and about                 more than 50%  Of the time                                3    Only limited self care, in bed greater than 50% of waking hours     4    Completely disabled, no self care, confined to bed or chair     5    Moribund  History reviewed. No pertinent past medical history.  Past Surgical History  Procedure Laterality Date  . Cardiac catheterization N/A 03/04/2016    Procedure: Right/Left Heart Cath and Coronary Angiography;  Surgeon: Lennette Bihari, MD;  Location: [facility] INVASIVE CV LAB;  Service: Cardiovascular;  Laterality: N/A;    History  Smoking status  . Former Smoker  Smokeless tobacco  . Not on file    History  Alcohol Use: Not on file    Social History   Social History  . Marital Status: Married    Spouse Name: N/A  . Number of Children: N/A  . Years of Education: N/A  Occupational History  . Not on file.   Social History Main Topics  . Smoking status: Former Games developermoker  . Smokeless tobacco: Not on file  . Alcohol Use: Not on file  . Drug Use: Not on file  . Sexual Activity: Not on file   Other Topics Concern  . Not on file   Social History Narrative   nonsmoker Minimal alcohol intake Retired lives with wife  No Known Allergies  Current Facility-Administered Medications  Medication Dose Route Frequency Provider Last Rate Last Dose  . 0.9 %  sodium chloride infusion  250 mL Intravenous PRN Dorothea OgleIskra M Myers, MD      . 0.9 %  sodium chloride infusion  250 mL Intravenous PRN Lennette Biharihomas A Kelly, MD      . acetaminophen (TYLENOL) tablet 650 mg  650 mg Oral Q4H PRN Lennette Biharihomas A Kelly, MD      . amiodarone (NEXTERONE PREMIX) 360 MG/200ML (1.8 mg/mL) IV infusion  30  mg/hr Intravenous Continuous Chilton Siiffany Shallowater, MD 16.7 mL/hr at 03/05/16 0841 30 mg/hr at 03/05/16 0841  . aspirin chewable tablet 81 mg  81 mg Oral Daily Zannie CovePreetha Joseph, MD   81 mg at 03/05/16 0932  . atorvastatin (LIPITOR) tablet 80 mg  80 mg Oral q1800 Chilton Siiffany Magnolia, MD   80 mg at 03/04/16 1800  . carvedilol (COREG) tablet 12.5 mg  12.5 mg Oral BID WC Chilton Siiffany Milford, MD   12.5 mg at 03/05/16 0528  . feeding supplement (ENSURE ENLIVE) (ENSURE ENLIVE) liquid 237 mL  237 mL Oral BID BM Dorothea OgleIskra M Myers, MD   237 mL at 03/03/16 1500  . heparin ADULT infusion 100 units/mL (25000 units/250 mL)  1,050 Units/hr Intravenous Continuous Lauren D Bajbus, RPH 10.5 mL/hr at 03/05/16 0842 1,050 Units/hr at 03/05/16 0842  . hydrALAZINE (APRESOLINE) injection 10 mg  10 mg Intravenous Q4H PRN Dorothea OgleIskra M Myers, MD   10 mg at 03/03/16 2305  . hydrALAZINE (APRESOLINE) tablet 10 mg  10 mg Oral 3 times per day Zannie CovePreetha Joseph, MD   10 mg at 03/05/16 0528  . labetalol (NORMODYNE,TRANDATE) injection 10 mg  10 mg Intravenous Q2H PRN Dorothea OgleIskra M Myers, MD      . ondansetron [facility]ZOFRAN) injection 4 mg  4 mg Intravenous Q6H PRN Lennette Biharihomas A Kelly, MD      . sodium chloride flush (NS) 0.9 % injection 3 mL  3 mL Intravenous Q12H Dorothea OgleIskra M Myers, MD   3 mL at 03/03/16 1000  . sodium chloride flush (NS) 0.9 % injection 3 mL  3 mL Intravenous PRN Dorothea OgleIskra M Myers, MD      . sodium chloride flush (NS) 0.9 % injection 3 mL  3 mL Intravenous Q12H Lennette Biharihomas A Kelly, MD   0 mL at 03/04/16 1545  . sodium chloride flush (NS) 0.9 % injection 3 mL  3 mL Intravenous PRN Lennette Biharihomas A Kelly, MD      . temazepam (RESTORIL) capsule 15 mg  15 mg Oral Once PRN Kerin PernaPeter Van Trigt, MD        Prescriptions prior to admission  Medication Sig Dispense Refill Last Dose  . Ascorbic Acid (VITAMIN C) 1000 MG tablet Take 1,000 mg by mouth daily.   03/02/2016 at Unknown time  . aspirin 81 MG tablet Take 81 mg by mouth every Monday, Wednesday, and Friday.   03/01/2016 at Unknown time   . magnesium 30 MG tablet Take 30 mg by mouth daily.   03/01/2016 at Unknown time  . Omega-3  Fatty Acids (FISH OIL) 1000 MG CAPS Take by mouth.   03/01/2016 at Unknown time    History reviewed. No pertinent family history.   Review of Systems:       Cardiac Review of Systems: Y or N  Chest Pain [   no ]  Resting SOB [ yes-PND  ] Exertional SOB  Mahler.Beck  ]  Orthopnea [ yes ]   Pedal Edema [no   ]    Palpitations Mahler.Beck  ] Syncope  [no  ]   Presyncope [no   ]  General Review of Systems: [Y] = yes [  ]=no Constitional: recent weight change [ minimal less than 5 pounds ]; anorexia [  ]; fatigue [  ]; nausea [  ]; night sweats [  ]; fever [  ]; or chills [  ]                                                               Dental: poor dentition[  ]; Last Dentist visit: Greater than one year  Eye : blurred vision [  ]; diplopia [   ]; vision changes [  ];  Amaurosis fugax[  ]; Resp: cough [  ];  wheezing[  ];  hemoptysis[  ]; shortness of breath[  ]; paroxysmal nocturnal dyspnea[ yes ]; dyspnea on exertion[ yes ]; or orthopnea[  ];  GI:  gallstones[  ], vomiting[  ];  dysphagia[  ]; melena[  ];  hematochezia [  ]; heartburn[  ];   Hx of  Colonoscopy[  ]; GU: kidney stones [  ]; hematuria[  ];   dysuria [  ];  nocturia[  ];  history of     obstruction [  ]; urinary frequency [  ]             Skin: rash, swelling[  ];, hair loss[  ];  peripheral edema[  ];  or itching[  ]; Musculosketetal: myalgias[  ];  joint swelling[  ];  joint erythema[  ];  joint pain[  ];  back pain[  ];  Heme/Lymph: bruising[  ];  bleeding[  ];  anemia[  ];  Neuro: TIA[  ];  headaches[  ];  stroke[  ];  vertigo[  ];  seizures[  ];   paresthesias[  ];  difficulty walking[  ];  Psych:depression[  ]; anxiety[  ];  Endocrine: diabetes[  ];  thyroid dysfunction[  ];  Immunizations: Flu [  ]; Pneumococcal[  ];  Other: Right-hand dominant, no history of thoracic trauma rib fracture or pneumothorax  Physical Exam: BP 110/70 mmHg   Pulse 70  Temp(Src) 97.5 F (36.4 C) (Oral)  Resp 17  Ht 5\' 2"  (1.575 m)  Wt 142 lb 4.5 oz (64.537 kg)  BMI 26.02 kg/m2  SpO2 100%      Physical Exam  General: Elderly Caucasian male, normal weight, no acute distress HEENT: Normocephalic pupils equal , dentition adequate Neck: Supple without JVD, adenopathy, or bruit Chest: Clear to auscultation, symmetrical breath sounds, no rhonchi, no tenderness             or deformity Cardiovascular: Irregular rate from atrial fibrillation,, no murmur, no gallop, peripheral pulses not  palpable in all extremities except left radial pulse Abdomen:  Soft, nontender, no palpable mass or organomegaly, no pulsatile mass Extremities: Warm, well-perfused, no clubbing cyanosis edema or tenderness,              no venous stasis changes of the legs Rectal/GU: Deferred Neuro: Grossly non--focal and symmetrical throughout Skin: Clean and dry without rash or ulceration     Diagnostic Studies & Laboratory data:     Recent Radiology Findings:   No results found. Chest x-ray shows interstitial pulmonary edema I have independently reviewed the above radiologic studies.  Recent Lab Findings: Lab Results  Component Value Date   WBC 10.4 03/05/2016   HGB 13.3 03/05/2016   HCT 39.9 03/05/2016   PLT 186 03/05/2016   GLUCOSE 105* 03/05/2016   CHOL 186 03/04/2016   TRIG 66 03/04/2016   HDL 41 03/04/2016   LDLCALC 132* 03/04/2016   ALT 16* 03/02/2016   AST 30 03/02/2016   NA 142 03/05/2016   K 3.9 03/05/2016   CL 112* 03/05/2016   CREATININE 1.25* 03/05/2016   BUN 19 03/05/2016   CO2 22 03/05/2016   TSH 0.666 03/04/2016   INR 1.09 03/04/2016   HGBA1C 5.4 03/04/2016      Assessment / Plan:      Severe three-vessel coronary disease with moderate LV dysfunction from prior inferior wall MI  Severe peripheral vascular disease with left carotid stenosis greater than 80%, asymptomatic   Atrial fibrillation of unknown duration    we plan for multivessel CABG, left atrial clip, and possible combined carotid endarterectomy pending evaluation by vascular surgery for carotid artery disease. Surgery scheduled for next week Tuesday, March 28   .    @ 03/05/2016 10:16 AM

## 2016-03-05 NOTE — Care Management Important Message (Signed)
Important Message  Patient Details  Name: [name] [name] MRN: [id number] Date of Birth: 26-Aug-1941   Medicare Important Message Given:  Yes    Tandi Hanko P Volney Reierson 03/05/2016, 1:24 PM

## 2016-03-06 ENCOUNTER — Encounter (HOSPITAL_COMMUNITY): Payer: Commercial Managed Care - HMO

## 2016-03-06 ENCOUNTER — Inpatient Hospital Stay (HOSPITAL_COMMUNITY): Payer: Commercial Managed Care - HMO

## 2016-03-06 LAB — TYPE AND SCREEN
ABO/RH(D): A POS
Antibody Screen: NEGATIVE
Unit division: 0
Unit division: 0
Unit division: 0
Unit division: 0

## 2016-03-06 LAB — SPIROMETRY WITH GRAPH
DL/VA % pred: 62 %
DL/VA: 2.5 ml/min/mmHg/L
DLCO unc % pred: 40 %
DLCO unc: 8.83 ml/min/mmHg
FEF 25-75 Post: 1.51 L/sec
FEF 25-75 Pre: 1.16 L/sec
FEF2575-%Change-Post: 30 %
FEF2575-%Pred-Post: 101 %
FEF2575-%Pred-Pre: 78 %
FEV1-%Change-Post: 15 %
FEV1-%Pred-Post: 91 %
FEV1-%Pred-Pre: 79 %
FEV1-Post: 1.9 L
FEV1-Pre: 1.65 L
FEV1FVC-%Change-Post: 9 %
FEV1FVC-%Pred-Pre: 92 %
FEV6-%Change-Post: 3 %
FEV6-%Pred-Post: 95 %
FEV6-%Pred-Pre: 91 %
FEV6-Post: 2.58 L
FEV6-Pre: 2.49 L
FEV6FVC-%Change-Post: -1 %
FEV6FVC-%Pred-Post: 107 %
FEV6FVC-%Pred-Pre: 108 %
FVC-%Change-Post: 4 %
FVC-%Pred-Post: 88 %
FVC-%Pred-Pre: 84 %
FVC-Post: 2.61 L
FVC-Pre: 2.49 L
Post FEV1/FVC ratio: 73 %
Post FEV6/FVC ratio: 99 %
Pre FEV1/FVC ratio: 66 %
Pre FEV6/FVC Ratio: 100 %
RV % pred: 83 %
RV: 1.78 L
TLC % pred: 72 %
TLC: 3.95 L

## 2016-03-06 LAB — HEPARIN LEVEL (UNFRACTIONATED): Heparin Unfractionated: 0.29 IU/mL — ABNORMAL LOW (ref 0.30–0.70)

## 2016-03-06 LAB — CBC
HCT: 40.3 % (ref 39.0–52.0)
Hemoglobin: 13.5 g/dL (ref 13.0–17.0)
MCH: 31.3 pg (ref 26.0–34.0)
MCHC: 33.5 g/dL (ref 30.0–36.0)
MCV: 93.3 fL (ref 78.0–100.0)
PLATELETS: 180 10*3/uL (ref 150–400)
RBC: 4.32 MIL/uL (ref 4.22–5.81)
RDW: 14.1 % (ref 11.5–15.5)
WBC: 9.4 10*3/uL (ref 4.0–10.5)

## 2016-03-06 MED ORDER — HEPARIN (PORCINE) IN NACL 100-0.45 UNIT/ML-% IJ SOLN: 1300.0000 [IU]/h | INTRAMUSCULAR | Status: AC

## 2016-03-06 MED ORDER — HEPARIN (PORCINE) IN NACL 100-0.45 UNIT/ML-% IJ SOLN: 1300.0000 [IU]/h | INTRAMUSCULAR | Status: DC

## 2016-03-06 MED ORDER — ENOXAPARIN (LOVENOX) PATIENT EDUCATION KIT
PACK | Freq: Once | Status: DC
  Filled 2016-03-06: qty 1

## 2016-03-06 MED ORDER — CARVEDILOL 25 MG PO TABS
25.0000 mg | ORAL_TABLET | Freq: Two times a day (BID) | ORAL | Status: DC
  Administered 2016-03-06 – 2016-03-09 (×7): 25 mg via ORAL
  Filled 2016-03-06 (×7): qty 1

## 2016-03-06 MED ORDER — ENOXAPARIN SODIUM 80 MG/0.8ML ~~LOC~~ SOLN
1.0000 mg/kg | Freq: Two times a day (BID) | SUBCUTANEOUS | Status: AC
  Administered 2016-03-06 – 2016-03-09 (×6): 65 mg via SUBCUTANEOUS
  Filled 2016-03-06 (×6): qty 0.8

## 2016-03-06 MED ORDER — ALBUTEROL SULFATE (2.5 MG/3ML) 0.083% IN NEBU
2.5000 mg | INHALATION_SOLUTION | Freq: Once | RESPIRATORY_TRACT | Status: AC
  Administered 2016-03-06: 2.5 mg via RESPIRATORY_TRACT

## 2016-03-06 NOTE — Progress Notes (Signed)
ANTICOAGULATION CONSULT NOTE  Pharmacy Consult for heparin>> Lovenox Indication: atrial fibrillation , CABG   No Known Allergies  Patient Measurements: Height: '5\' 2"'  (157.5 cm) Weight: 147 lb 1.6 oz (66.724 kg) (scale b) IBW/kg (Calculated) : 54.6 Heparin Dosing Weight: 65.6kg  Vital Signs: Temp: 97.7 F (36.5 C) (03/24 1100) Temp Source: Oral (03/24 1100) BP: 127/78 mmHg (03/24 1100)  Labs:  Recent Labs  03/04/16 0400 03/05/16 0540 03/05/16 1632 03/06/16 0540 03/06/16 0545  HGB  --  13.3  --   --  13.5  HCT  --  39.9  --   --  40.3  PLT  --  186  --   --  180  APTT  --  37  --   --   --   LABPROT 14.3  --   --   --   --   INR 1.09  --   --   --   --   HEPARINUNFRC  --  <0.10* 0.30 0.29*  --   CREATININE 1.40* 1.25*  --   --   --     Estimated Creatinine Clearance: 42.9 mL/min (by C-G formula based on Cr of 1.25).  Assessment: 63 YOM s/p cath on 3/22 with sig vessel disease, went into AFib during procedure. Plan for CABG 3/28.   To transition to Lovenox in preparation of discharge and readmission for CABG. Spoke with Dr. Broadus John, and plan for d/c later today or possibly tomorrow depending on CVTS clearance of HR.  SCr 1.25, CrCl ~40-51m/min. Hgb 13.5, plts 180- no bleeding noted. Has been stable during this admission.  Goal of Therapy:  Heparin level 0.3-0.7 units/ml Monitor platelets by anticoagulation protocol: Yes   Plan:  - Continue heparin at 1300 units/hr until 1600 tonight, then heparin drip to be turned off - Start Lovenox 151mkg ([id number] q12h starting tonight at 1700 - CBC and BMET in the morning if patient still here - Lovenox teaching kit ordered for RN to provide to patient when administering injections  Lavonne Cass D. Taino Maertens, PharmD, BCPS Clinical Pharmacist Pager: [phone] 12:40 PM

## 2016-03-06 NOTE — Progress Notes (Signed)
Patient ID: [name] [name], male   DOB: 1941/04/20, 75 y.o.   MRN: [id number]  [facility] PROGRESS NOTE  [name] [name] WGN:[id number] DOB: 1941/04/20 DOA: 03/02/2016 PCP: Joycelyn RuaMEYERS, STEPHEN, MD   Brief narrative:    Pt is 75 yo male, functional at baseline, presented from [facility] office as his BP was > 220/120. Pt reports he has not seen doctor in years and currently reports only occasional exertional dyspnea and some weight gain but is not sure how many pounds. He denies fevers, chills, chest pain, no abd or urinary concerns, take no BP medications.  In ED, pt noted to be hemodynamically stable, VS notable for SBP 190's and blood work unremarkable. CXR with signs of CHF.  Ct chest with large B/l effusions  Assessment/Plan:    Active Problems:  CAD and L carotid artery disease -[facility] Dr.Van Trigt consulting -80% L carotid artery disease: VVS Dr.Dickson consulting -plan for CABG and L CEA on Tuesday  P.Afib -went into Afib during cath -still in Afib, HR improved still fluctuating -changed to PO amiodarone today, will change to SQ lovenox -lovenox teaching, keep on tele  Hypertensive urgency - continue Coreg, stopped ACEI,  - added po hydralazine - Hold ARB dose due to AKI  Acute systolic and diastolic CHF - was on lasix 40 mg IV BID - EF 35-40% and grade 2 Diastolic dysfunction -negative 400cc only, CT chest with large bilateral effusions -diuretics held due to AKI, now on lasix 40mg  daily  AKI  -due to ACE(lisinopril and diuretics likely) -ACE stopped 3/21 and also held ARB 3/22  Hypokalemia - replaced  DVT prophylaxis - Lovenox SQ  Code Status: Full Family Communication:  plan of care discussed with the patient Disposition Plan: home when stable and depending on CABG timing  IV access:  Peripheral IV  Procedures and diagnostic studies:    Dg Chest 2 View 03/02/2016 Right greater than left basilar airspace disease could be due to  atelectasis or pneumonia. Small pleural effusions are also seen, greater on the right.   Ct Angio Chest Pe W/cm &/or Wo Cm 03/02/2016  Large bilateral pleural effusions with associated partial consolidation in the lower lobes. No evidence of pulmonary emboli.   Medical Consultants:  Cardiology   Other Consultants:  None  IAnti-Infectives:   None  Zannie CoveJOSEPH,Betina Puckett, MD  [facility] Pager [phone]  If 7PM-7AM, please contact night-coverage [email or web address] Password [email or web address] 03/06/2016, 4:21 PM   LOS: 4 days   HPI/Subjective: Feels ok, no chest pain or dyspnea  Objective: Filed Vitals:   03/05/16 1627 03/05/16 2226 03/06/16 0549 03/06/16 1100  BP: 140/98 143/86  127/78  Pulse: 96 97    Temp:  98.4 F (36.9 C)  97.7 F (36.5 C)  TempSrc:  Oral  Oral  Resp:    18  Height:      Weight:   66.724 kg (147 lb 1.6 oz)   SpO2:  99%  99%    Intake/Output Summary (Last 24 hours) at 03/06/16 1621 Last data filed at 03/06/16 1110  Gross per 24 hour  Intake  600.6 ml  Output    275 ml  Net  325.6 ml    Exam:   General:  Pt is alert, follows commands appropriately, not in acute distress  Cardiovascular: Regular rate and rhythm, S1/S2, no murmurs, no rubs, no gallops  Respiratory: Clear to auscultation bilaterally, crackles at bases   Abdomen: Soft, non tender, non distended, bowel sounds present, no guarding  Extremities: pulses DP and PT palpable bilaterally  Neuro: Grossly nonfocal  Data Reviewed: Basic Metabolic Panel:  Recent Labs Lab 03/02/16 1421 03/02/16 1429 03/03/16 0354 03/04/16 0400 03/05/16 0540  NA 140 141 145 140 142  K 4.6 4.2 3.3* 3.1* 3.9  CL 105 103 107 109 112*  CO2 20*  --  GLUCOSE 89 88 107* 116* 105*  BUN 16 21* CREATININE 0.97 0.90 0.99 1.40* 1.25*  CALCIUM 10.3  --  10.7* 10.2 9.8   Liver Function Tests:  Recent Labs Lab 03/02/16 1421  AST 30  ALT 16*  ALKPHOS 69  BILITOT 1.7*  PROT 6.8  ALBUMIN 3.7    CBC:  Recent Labs Lab 03/02/16 1421 03/02/16 1429 03/03/16 0354 03/05/16 0540 03/06/16 0545  WBC 10.5  --  10.1 10.4 9.4  NEUTROABS 7.7  --   --   --   --   HGB 16.3 16.7 15.4 13.3 13.5  HCT 46.1 49.0 44.6 39.9 40.3  MCV 91.8  --  89.9 93.2 93.3  PLT 214  --  219 186 180   Cardiac Enzymes:  Recent Labs Lab 03/02/16 1421  TROPONINI 0.04*   Scheduled Meds: . [START ON 03/11/2016] amiodarone  200 mg Oral Daily  . amiodarone  400 mg Oral BID  . aspirin  81 mg Oral Daily  . atorvastatin  80 mg Oral q1800  . carvedilol  25 mg Oral BID WC  . enoxaparin (LOVENOX) injection  1 mg/kg Subcutaneous Q12H  . enoxaparin   Does not apply Once  . feeding supplement (ENSURE ENLIVE)  237 mL Oral BID BM  . furosemide  40 mg Oral Daily  . hydrALAZINE  10 mg Oral 3 times per day  . sodium chloride flush  3 mL Intravenous Q12H  . sodium chloride flush  3 mL Intravenous Q12H   Continuous Infusions:

## 2016-03-06 NOTE — Progress Notes (Signed)
ANTICOAGULATION CONSULT NOTE - Follow Up Consult  Pharmacy Consult for heparin Indication: atrial fibrillation and CAD awaiting CABG  Labs:  Recent Labs  03/04/16 0400 03/05/16 0540 03/05/16 1632 03/06/16 0540 03/06/16 0545  HGB  --  13.3  --   --  13.5  HCT  --  39.9  --   --  40.3  PLT  --  186  --   --  180  APTT  --  37  --   --   --   LABPROT 14.3  --   --   --   --   INR 1.09  --   --   --   --   HEPARINUNFRC  --  <0.10* 0.30 0.29*  --   CREATININE 1.40* 1.25*  --   --   --      Assessment: 75yo male now subtherapeutic on heparin with lower level despite increased rate.  Goal of Therapy:  Heparin level 0.3-0.7 units/ml   Plan:  Will increase heparin gtt by 2 units/kg/hr to 1300 units/hr and check level in 8hr.  [name] GamblesVeronda Geralyn Figiel, PharmD, BCPS  03/06/2016,6:17 AM

## 2016-03-06 NOTE — Progress Notes (Signed)
SUBJECTIVE:  Feeling well.  [name] [name] has ambulated without chest pain or shortness of breath. He is eager to go home. Well appearing.   PHYSICAL EXAM Filed Vitals:   03/05/16 1305 03/05/16 1627 03/05/16 2226 03/06/16 0549  BP: 127/72 140/98 143/86   Pulse: 91 96 97   Temp: 97.9 F (36.6 C)  98.4 F (36.9 C)   TempSrc: Oral  Oral   Resp: 18     Height:      Weight:    66.724 kg (147 lb 1.6 oz)  SpO2: 99%  99%    Gen: Well-appearing. No acute distress.  Neck: No JVD. Mild R carotid bruit. COR: RRR. No m/r/g. Normal S1/S2.  Lungs: CTAB. No crackles, rhonchi or wheezes  Abd: Soft. Non-tender, non-distended.  Ext: WWP. No edema.   LABS: Lab Results  Component Value Date   TROPONINI 0.04* 03/02/2016   Results for orders placed or performed during the hospital encounter of 03/02/16 (from the past 24 hour(s))  Heparin level (unfractionated)     Status: None   Collection Time: 03/05/16  4:32 PM  Result Value Ref Range   Heparin Unfractionated 0.30 0.30 - 0.70 IU/mL  Heparin level (unfractionated)     Status: Abnormal   Collection Time: 03/06/16  5:40 AM  Result Value Ref Range   Heparin Unfractionated 0.29 (L) 0.30 - 0.70 IU/mL  CBC     Status: None   Collection Time: 03/06/16  5:45 AM  Result Value Ref Range   WBC 9.4 4.0 - 10.5 K/uL   RBC 4.32 4.22 - 5.81 MIL/uL   Hemoglobin 13.5 13.0 - 17.0 g/dL   HCT 16.1 09.6 - 04.5 %   MCV 93.3 78.0 - 100.0 fL   MCH 31.3 26.0 - 34.0 pg   MCHC 33.5 30.0 - 36.0 g/dL   RDW 40.9 81.1 - 91.4 %   Platelets 180 150 - 400 K/uL    Intake/Output Summary (Last 24 hours) at 03/06/16 1026 Last data filed at 03/06/16 0834  Gross per 24 hour  Intake 1080.6 ml  Output    325 ml  Net  755.6 ml    Telemetry: No events  ASSESSMENT AND PLAN:  Principal Problem:   Acute systolic and diastolic CHF (congestive heart failure), NYHA class 1 (HCC) Active Problems:   Hypertensive urgency   Hypokalemia   Bilateral pleural  effusion   Acute combined systolic and diastolic heart failure (HCC)   Hypertensive heart disease with heart failure (HCC)   Essential hypertension   CAD in native artery   Bruit   CAD (coronary artery disease)   PAD (peripheral artery disease) (HCC)   Bilateral carotid artery disease (HCC)    # Multivessel CAD: [name] [name] remains asymptomatic. He is currently underway and coronary artery bypass grafting on Tuesday. He has not had any recurrent chest pain since admission.  His blood pressure remains slightly elevated. We will increase carvedilol to 25 mg twice a day. Continue aspirin, atorvastatin, and hydralazine.  He is not on an ACE inhibitor or ARB due to acute renal failure after starting one this admission.  # Carotid artery disease: Left carotid endarterectomy as planned on Tuesday at the same time he has his CABG. Continue aspirin and statin as above.  # Hypertensive heart disease: Increasing carvedilol as above. Continue hydralazine.  # Atrial fibrillation.  Heart rate remains well controlled. He was switched from IV amiodarone to an oral load yesterday. Continue twice a day dosing until  3/29, at which time he'll switch to 200 mg daily. Increasing carvedilol as above. He mentioned that he may be able to go home today.  I would recommend switching his heparin to Lovenox injections.  # Acute systolic and diastolic heart failure: he appears to be euvolemic on exam today. LVEF is 35-40% with grade 2 diastolic dysfunction on echo.  In/Outs are not well-recorded.  It appears that his weight is up 5 pounds today, though I'm not sure this is accurate.  Lasix 40 mg by mouth daily was started today.  Increased carvedilol.  Consider re-challenging with ACE-I/ARB post-operatively, as he was also receiving lasix and had IV contrast when his creatinine increased this admission.    Time spent: 20 minutes-Greater than 50% of this time was spent in counseling, explanation of diagnosis, planning of  further management, and coordination of care.    Brandalyn Harting C. Duke Salviaandolph, MD, [facility] 03/06/2016 10:26 AM

## 2016-03-06 NOTE — Progress Notes (Signed)
   VASCULAR SURGERY ASSESSMENT & PLAN:  * Plan is for combined left carotid endarterectomy and CABG next Tuesday. I will discuss the patient with Dr. Darrick PennaFields to see if he is available to do the carotid endarterectomy. I have again discussed the procedure with the patient today and he is agreeable to proceed. He is on aspirin and is on a statin.  SUBJECTIVE: No specific complaints.  PHYSICAL EXAM: Filed Vitals:   03/05/16 1627 03/05/16 2226 03/06/16 0549 03/06/16 1100  BP: 140/98 143/86  127/78  Pulse: 96 97    Temp:  98.4 F (36.9 C)  97.7 F (36.5 C)  TempSrc:  Oral  Oral  Resp:    18  Height:      Weight:   147 lb 1.6 oz (66.724 kg)   SpO2:  99%  99%   Neuro intact.  LABS: Lab Results  Component Value Date   WBC 9.4 03/06/2016   HGB 13.5 03/06/2016   HCT 40.3 03/06/2016   MCV 93.3 03/06/2016   PLT 180 03/06/2016   Lab Results  Component Value Date   CREATININE 1.25* 03/05/2016   Lab Results  Component Value Date   INR 1.09 03/04/2016   CBG (last 3)  No results for input(s): GLUCAP in the last 72 hours.  Principal Problem:   Acute systolic and diastolic CHF (congestive heart failure), NYHA class 1 (HCC) Active Problems:   Hypertensive urgency   Hypokalemia   Bilateral pleural effusion   Acute combined systolic and diastolic heart failure (HCC)   Hypertensive heart disease with heart failure (HCC)   Essential hypertension   CAD in native artery   Bruit   CAD (coronary artery disease)   PAD (peripheral artery disease) (HCC)   Bilateral carotid artery disease (HCC)    [name] [name] Beeper: [phone]: [phone] 03/06/2016

## 2016-03-06 NOTE — Progress Notes (Signed)
CARDIAC REHAB PHASE I   PRE:  Rate/Rhythm: 111 a.fib  BP:  Sitting: 136/81        SaO2: 99 RA  MODE:  Ambulation: 460 ft   POST:  Rate/Rhythm: 105 a fib  BP:  Sitting: 129/76        SaO2: 100 RA  Pt ambulated 460 ft on RA, IV, independent, steady gait, tolerated well. Pt denies any complaints, however appears to have some mild-moderate DOE. Pt denies shortness of breath and declined rest stop. Pt states he thinks is going to be discharged this afternoon and return for surgery on Tuesday. Pre-op education completed with pt at bedside. Pt states he has viewed the cardiac surgery videos. Reviewed IS, activity progression, sternal precautions, cardiac surgery book and cardiac surgery guidelines. Pt verbalized understanding. Pt to bed per pt request after walk, call bell within reach. Will follow post-op.   [id number]  Joylene GrapesEmily C Meg Niemeier, RN, BSN 03/06/2016 2:29 PM

## 2016-03-07 LAB — BASIC METABOLIC PANEL
Anion gap: 8 (ref 5–15)
BUN: 29 mg/dL — AB (ref 6–20)
CALCIUM: 10.1 mg/dL (ref 8.9–10.3)
CO2: 22 mmol/L (ref 22–32)
CREATININE: 1.31 mg/dL — AB (ref 0.61–1.24)
Chloride: 112 mmol/L — ABNORMAL HIGH (ref 101–111)
GFR, EST AFRICAN AMERICAN: 60 mL/min — AB (ref >60)
GFR, EST NON AFRICAN AMERICAN: 52 mL/min — AB (ref >60)
Glucose, Bld: 108 mg/dL — ABNORMAL HIGH (ref 65–99)
Potassium: 3.9 mmol/L (ref 3.5–5.1)
SODIUM: 142 mmol/L (ref 135–145)

## 2016-03-07 LAB — CBC
HCT: 39.2 % (ref 39.0–52.0)
HEMOGLOBIN: 13.4 g/dL (ref 13.0–17.0)
MCH: 32.1 pg (ref 26.0–34.0)
MCHC: 34.2 g/dL (ref 30.0–36.0)
MCV: 93.8 fL (ref 78.0–100.0)
PLATELETS: 178 10*3/uL (ref 150–400)
RBC: 4.18 MIL/uL — ABNORMAL LOW (ref 4.22–5.81)
RDW: 14.4 % (ref 11.5–15.5)
WBC: 7.1 10*3/uL (ref 4.0–10.5)

## 2016-03-07 MED ORDER — FUROSEMIDE 10 MG/ML IJ SOLN
40.0000 mg | Freq: Two times a day (BID) | INTRAMUSCULAR | Status: DC
  Administered 2016-03-07 – 2016-03-08 (×2): 40 mg via INTRAVENOUS
  Filled 2016-03-07 (×2): qty 4

## 2016-03-07 MED ORDER — HYDRALAZINE HCL 25 MG PO TABS
25.0000 mg | ORAL_TABLET | Freq: Three times a day (TID) | ORAL | Status: DC
  Administered 2016-03-07 – 2016-03-09 (×8): 25 mg via ORAL
  Filled 2016-03-07 (×9): qty 1

## 2016-03-07 MED ORDER — POLYETHYLENE GLYCOL 3350 17 G PO PACK
17.0000 g | PACK | Freq: Every day | ORAL | Status: DC
  Filled 2016-03-07 (×2): qty 1

## 2016-03-07 NOTE — Progress Notes (Signed)
SUBJECTIVE:  Feeling well.  [name] [name] has ambulated without chest pain or shortness of breath.    PHYSICAL EXAM Filed Vitals:   03/06/16 1657 03/06/16 2022 03/07/16 0600 03/07/16 0826  BP: 115/91 123/67 145/91 150/87  Pulse: 80 56 105 82  Temp:  97.7 F (36.5 C) 98 F (36.7 C)   TempSrc:  Oral Oral   Resp:   19   Height:      Weight:   67.268 kg (148 lb 4.8 oz)   SpO2:  98% 98%    Gen: Well-appearing. No acute distress.  Neck: JVP 2 cm above the clavicle at 45. Mild R carotid bruit. COR: RRR. No m/r/g. Normal S1/S2.  Lungs: CTAB. Decreased breath sounds at the right base. Mild bibasilar crackles. Abd: Soft. Non-tender, non-distended.  Ext: WWP. No edema.   LABS: Lab Results  Component Value Date   TROPONINI 0.04* 03/02/2016   Results for orders placed or performed during the hospital encounter of 03/02/16 (from the past 24 hour(s))  CBC     Status: Abnormal   Collection Time: 03/07/16  3:30 AM  Result Value Ref Range   WBC 7.1 4.0 - 10.5 K/uL   RBC 4.18 (L) 4.22 - 5.81 MIL/uL   Hemoglobin 13.4 13.0 - 17.0 g/dL   HCT 16.1 09.6 - 04.5 %   MCV 93.8 78.0 - 100.0 fL   MCH 32.1 26.0 - 34.0 pg   MCHC 34.2 30.0 - 36.0 g/dL   RDW 40.9 81.1 - 91.4 %   Platelets 178 150 - 400 K/uL  Basic metabolic panel     Status: Abnormal   Collection Time: 03/07/16  3:30 AM  Result Value Ref Range   Sodium 142 135 - 145 mmol/L   Potassium 3.9 3.5 - 5.1 mmol/L   Chloride 112 (H) 101 - 111 mmol/L   CO2 22 22 - 32 mmol/L   Glucose, Bld 108 (H) 65 - 99 mg/dL   BUN 29 (H) 6 - 20 mg/dL   Creatinine, Ser 7.82 (H) 0.61 - 1.24 mg/dL   Calcium 95.6 8.9 - 21.3 mg/dL   GFR calc non Af Amer 52 (L) >60 mL/min   GFR calc Af Amer 60 (L) >60 mL/min   Anion gap 8 5 - 15    Intake/Output Summary (Last 24 hours) at 03/07/16 1136 Last data filed at 03/07/16 1135  Gross per 24 hour  Intake    363 ml  Output    300 ml  Net     63 ml    Telemetry: Atrial fibrillation. Rate 70s to  90s. Pauses up to 2 seconds.  ASSESSMENT AND PLAN:  Principal Problem:   Acute systolic and diastolic CHF (congestive heart failure), NYHA class 1 (HCC) Active Problems:   Hypertensive urgency   Hypokalemia   Bilateral pleural effusion   Acute combined systolic and diastolic heart failure (HCC)   Hypertensive heart disease with heart failure (HCC)   Essential hypertension   CAD in native artery   Bruit   CAD (coronary artery disease)   PAD (peripheral artery disease) (HCC)   Bilateral carotid artery disease (HCC)    # Multivessel CAD: [name] [name] remains asymptomatic. He is currently underway and coronary artery bypass grafting on Tuesday. He has not had any recurrent chest pain since admission.  His blood pressure remains slightly elevated. We will increase carvedilol to 25 mg twice a day.  Continue aspirin and atorvastatin.  He is not on an ACE inhibitor  or ARB due to acute renal failure after starting one this admission.  # Carotid artery disease: Left carotid endarterectomy as planned on Tuesday at the same time he has his CABG. Continue aspirin and statin as above.  # Hypertensive heart disease: Increasing carvedilol as above. Increase hydralazine to 25 mg q8h.   # Atrial fibrillation.  Heart rate remains well controlled.  Continue amiodarone 400 mg bid until 3/29, at which time he'll switch to 200 mg daily. Continue carvedilol and heparin.   # Acute systolic and diastolic heart failure: [name] [name] appears to be volume overloaded today. We will start Lasix 40 mg IV twice a day.  LVEF is 35-40% with grade 2 diastolic dysfunction on echo.  Continue carvedilol Consider re-challenging with ACE-I/ARB if renal function remained stable post-operatively.  Time spent: 20 minutes-Greater than 50% of this time was spent in counseling, explanation of diagnosis, planning of further management, and coordination of care.    Anelia Carriveau C. Duke Salviaandolph, MD, [facility] 03/07/2016 11:36 AM

## 2016-03-07 NOTE — Progress Notes (Signed)
Patient ID: [name] [name], male   DOB: 07-02-1941, 75 y.o.   MRN: [id number]  [facility] PROGRESS NOTE  [name] [name] [id number] DOB: 07-02-1941 DOA: 03/02/2016 PCP: Joycelyn RuaMEYERS, STEPHEN, MD   Brief narrative:    Pt is 75 yo male, functional at baseline, [place] > 220/120. Pt reports he has not seen doctor in years and currently reports only occasional exertional dyspnea and some weight gain but is not sure how many pounds. He denies fevers, chills, chest pain, no abd or urinary concerns, take no BP medications.  In ED, pt noted to be hemodynamically stable, VS notable for SBP 190's and blood work unremarkable. CXR with signs of CHF.  Ct chest with large B/l effusions  Assessment/Plan:    Active Problems:  CAD and L carotid artery disease -[facility] Dr.Van Trigt consulting -80% L carotid artery disease: V[facility] Dr.Dickson consulting -plan for CABG and L CEA on Tuesday  P.Afib -went into Afib during cath -still in Afib, HR improved still fluctuating -changed to PO amiodarone 3/24, -now on SQ lovenox  Hypertensive urgency - continue Coreg, stopped ACEI,  - added po hydralazine - Hold ARB dose due to AKI  Acute systolic and diastolic CHF - was on lasix 40 mg IV BID - EF 35-40% and grade 2 Diastolic dysfunction -negative 400cc only, CT chest with large bilateral effusions -diuretics held due to AKI, now on lasix 40mg  daily  AKI  -due to ACE(lisinopril and diuretics likely) -ACE stopped 3/21 and also held ARB 3/22  Hypokalemia - replaced  DVT prophylaxis - Lovenox SQ  Code Status: Full Family Communication:  plan of care discussed with the patient Disposition Plan: no discharge prior to surgery, got msg from CVTS today  IV access:  Peripheral IV  Procedures and diagnostic studies:    Dg Chest 2 View 03/02/2016 Right greater than left basilar airspace disease could be due to atelectasis or pneumonia. Small  pleural effusions are also seen, greater on the right.   Ct Angio Chest Pe W/cm &/or Wo Cm 03/02/2016  Large bilateral pleural effusions with associated partial consolidation in the lower lobes. No evidence of pulmonary emboli.   Medical Consultants:  Cardiology   Other Consultants:  None  IAnti-Infectives:   None  Zannie CoveJOSEPH,Yolander Goodie, MD  [facility] Pager [phone]  If 7PM-7AM, please contact night-coverage [email or web address] Password [facility] 03/07/2016, 11:20 AM   LOS: 5 days   HPI/Subjective: Feels ok, no chest pain or dyspnea  Objective: Filed Vitals:   03/06/16 1657 03/06/16 2022 03/07/16 0600 03/07/16 0826  BP: 115/91 123/67 145/91 150/87  Pulse: 80 56 105 82  Temp:  97.7 F (36.5 C) 98 F (36.7 C)   TempSrc:  Oral Oral   Resp:   19   Height:      Weight:   67.268 kg (148 lb 4.8 oz)   SpO2:  98% 98%     Intake/Output Summary (Last 24 hours) at 03/07/16 1120 Last data filed at 03/07/16 0605  Gross per 24 hour  Intake    360 ml  Output    300 ml  Net     60 ml    Exam:   General:  Pt is alert, follows commands appropriately, not in acute distress  Cardiovascular: Regular rate and rhythm, S1/S2, no murmurs, no rubs, no gallops  Respiratory: Clear to auscultation bilaterally, crackles at bases   Abdomen: Soft, non tender, non distended, bowel sounds present, no guarding  Extremities: pulses DP and PT palpable bilaterally  Neuro: Grossly nonfocal  Data Reviewed: Basic Metabolic Panel:  Recent Labs Lab 03/02/16 1421 03/02/16 1429 03/03/16 0354 03/04/16 0400 03/05/16 0540 03/07/16 0330  NA 140 141 145 140 142 142  K 4.6 4.2 3.3* 3.1* 3.9 3.9  CL 105 103 107 109 112* 112*  CO2 20*  --  GLUCOSE 89 88 107* 116* 105* 108*  BUN 16 21* 29*  CREATININE 0.97 0.90 0.99 1.40* 1.25* 1.31*  CALCIUM 10.3  --  10.7* 10.2 9.8 10.1   Liver Function Tests:  Recent Labs Lab 03/02/16 1421  AST 30  ALT 16*  ALKPHOS 69  BILITOT 1.7*  PROT 6.8   ALBUMIN 3.7   CBC:  Recent Labs Lab 03/02/16 1421 03/02/16 1429 03/03/16 0354 03/05/16 0540 03/06/16 0545 03/07/16 0330  WBC 10.5  --  10.1 10.4 9.4 7.1  NEUTROABS 7.7  --   --   --   --   --   HGB 16.3 16.7 15.4 13.3 13.5 13.4  HCT 46.1 49.0 44.6 39.9 40.3 39.2  MCV 91.8  --  89.9 93.2 93.3 93.8  PLT 214  --  219 186 180 178   Cardiac Enzymes:  Recent Labs Lab 03/02/16 1421  TROPONINI 0.04*   Scheduled Meds: . [START ON 03/11/2016] amiodarone  200 mg Oral Daily  . amiodarone  400 mg Oral BID  . aspirin  81 mg Oral Daily  . atorvastatin  80 mg Oral q1800  . carvedilol  25 mg Oral BID WC  . enoxaparin (LOVENOX) injection  1 mg/kg Subcutaneous Q12H  . enoxaparin   Does not apply Once  . feeding supplement (ENSURE ENLIVE)  237 mL Oral BID BM  . furosemide  40 mg Oral Daily  . hydrALAZINE  10 mg Oral 3 times per day  . sodium chloride flush  3 mL Intravenous Q12H  . sodium chloride flush  3 mL Intravenous Q12H   Continuous Infusions:

## 2016-03-07 NOTE — Consult Note (Signed)
[facility]  Subjective  - feels ok   Objective 150/87 82 98 F (36.7 C) (Oral) 19 98%  Intake/Output Summary (Last 24 hours) at 03/07/16 0903 Last data filed at 03/07/16 0605  Gross per 24 hour  Intake    360 ml  Output    450 ml  Net    -90 ml   Neuro: symmetric UE/LE motor  Assessment/Planning: Left CEA combined with CABG next week Questions answered.  Risks benefits procedure discussed.  Fields, Charles 03/07/2016 9:03 AM --  Laboratory Lab Results:  Recent Labs  03/06/16 0545 03/07/16 0330  WBC 9.4 7.1  HGB 13.5 13.4  HCT 40.3 39.2  PLT 180 178   BMET  Recent Labs  03/05/16 0540 03/07/16 0330  NA 142 142  K 3.9 3.9  CL 112* 112*  CO2 22 22  GLUCOSE 105* 108*  BUN 19 29*  CREATININE 1.25* 1.31*  CALCIUM 9.8 10.1    COAG Lab Results  Component Value Date   INR 1.09 03/04/2016   No results found for: PTT      

## 2016-03-07 NOTE — Progress Notes (Signed)
CARDIAC REHAB PHASE I   PRE:  Rate/Rhythm: 74 irregular  BP:  Sitting: 106/56      SaO2: 95 RA  MODE:  Ambulation: 600 ft   POST:  Rate/Rhythm: 92 irregular  BP:  Sitting: 120/60     SaO2: 97 RA  1445-1505 Patient ambulated in hallway x 1 assist. Slow, steady gait noted. Patient denied complaints. Post ambulation patient back to bedside sitting. Encouraged to ambulate again this evening. Will follow up on Monday.  Maegan Buller English PayneRN, BSN 03/07/2016 3:05 PM

## 2016-03-08 LAB — BASIC METABOLIC PANEL
ANION GAP: 7 (ref 5–15)
BUN: 29 mg/dL — ABNORMAL HIGH (ref 6–20)
CHLORIDE: 109 mmol/L (ref 101–111)
CO2: 25 mmol/L (ref 22–32)
CREATININE: 1.45 mg/dL — AB (ref 0.61–1.24)
Calcium: 10.1 mg/dL (ref 8.9–10.3)
GFR calc non Af Amer: 46 mL/min — ABNORMAL LOW (ref >60)
GFR, EST AFRICAN AMERICAN: 53 mL/min — AB (ref >60)
Glucose, Bld: 104 mg/dL — ABNORMAL HIGH (ref 65–99)
POTASSIUM: 3.7 mmol/L (ref 3.5–5.1)
SODIUM: 141 mmol/L (ref 135–145)

## 2016-03-08 LAB — CBC
HCT: 39.6 % (ref 39.0–52.0)
HEMOGLOBIN: 12.9 g/dL — AB (ref 13.0–17.0)
MCH: 31 pg (ref 26.0–34.0)
MCHC: 32.6 g/dL (ref 30.0–36.0)
MCV: 95.2 fL (ref 78.0–100.0)
Platelets: 195 10*3/uL (ref 150–400)
RBC: 4.16 MIL/uL — AB (ref 4.22–5.81)
RDW: 14.3 % (ref 11.5–15.5)
WBC: 8.8 10*3/uL (ref 4.0–10.5)

## 2016-03-08 MED ORDER — FUROSEMIDE 40 MG PO TABS: 40.0000 mg | ORAL_TABLET | Freq: Every day | ORAL | Status: DC

## 2016-03-08 NOTE — Progress Notes (Signed)
Patient ID: [name] [name], male   DOB: 1941-08-15, 75 y.o.   MRN: [id number]  [facility] PROGRESS NOTE  [name] [name] [id number] DOB: 1941-08-15 DOA: 03/02/2016 PCP: Joycelyn RuaMEYERS, STEPHEN, MD   Brief narrative:    Pt is 75 yo male, functional at baseline, [place] as [place] was > 220/120. Pt reports he has not seen doctor in years and currently reports only occasional exertional dyspnea and some weight gain but is not sure how many pounds. He denies fevers, chills, chest pain, no abd or urinary concerns, take no BP medications.  In ED, pt noted to be hemodynamically stable, VS notable for SBP 190's and blood work unremarkable. CXR with signs of CHF.  Ct chest with large B/l effusions  Assessment/Plan:    Active Problems:  CAD and L carotid artery disease -[facility] -CVTS Dr.Van Trigt consulting -80% L carotid artery disease: V[facility] Dr.Dickson consulting -plan for CABG and L CEA on Tuesday -remains stable without symptoms  P.Afib -went into Afib during cath -still in Afib, HR improved still fluctuating -changed to PO amiodarone 3/24, change to 200mg  daily on 3/29 -now on SQ lovenox  Hypertensive urgency - continue Coreg, stopped ACEI,  - added po hydralazine - Hold ARB dose due to AKI  Acute systolic and diastolic CHF - was on lasix 40 mg IV BID - EF 35-40% and grade 2 Diastolic dysfunction -negative 400cc only, CT chest with large bilateral effusions -diuretics held due to AKI, now on lasix 40mg  daily  AKI  -due to ACE(lisinopril and diuretics likely) -ACE stopped 3/21 and also held ARB 3/22  Hypokalemia - replaced  DVT prophylaxis - Lovenox SQ  Code Status: Full Family Communication:  plan of care discussed with the patient Disposition Plan: no discharge prior to surgery, got msg from CVTS today  IV access:  Peripheral IV  Procedures and diagnostic studies:    Dg Chest 2 View 03/02/2016 Right greater than left basilar  airspace disease could be due to atelectasis or pneumonia. Small pleural effusions are also seen, greater on the right.   Ct Angio Chest Pe W/cm &/or Wo Cm 03/02/2016  Large bilateral pleural effusions with associated partial consolidation in the lower lobes. No evidence of pulmonary emboli.   Medical Consultants:  Cardiology   Other Consultants:  None  IAnti-Infectives:   None  Zannie CoveJOSEPH,[name], MD  [facility] Pager [phone]  If 7PM-7AM, please contact night-coverage [email or web address] Password [email or web address] 03/08/2016, 1:44 PM   LOS: 6 days   HPI/Subjective: Feels ok, no chest pain or dyspnea  Objective: Filed Vitals:   03/07/16 1230 03/07/16 2045 03/08/16 0637 03/08/16 1230  BP: 114/92 121/62 129/82 138/88  Pulse: 82 75 70 55  Temp: 97.5 F (36.4 C) 98 F (36.7 C) 98.2 F (36.8 C) 97.9 F (36.6 C)  TempSrc: Oral Oral Oral Oral  Resp: 20 18 18 20   Height:      Weight:   67.042 kg (147 lb 12.8 oz)   SpO2: 98% 98% 96% 99%    Intake/Output Summary (Last 24 hours) at 03/08/16 1344 Last data filed at 03/08/16 0900  Gross per 24 hour  Intake    483 ml  Output    401 ml  Net     82 ml    Exam:   General:  Pt is alert, follows commands appropriately, not in acute distress  Cardiovascular: Regular rate and rhythm, S1/S2, no murmurs, no rubs, no gallops  Respiratory: Clear to auscultation bilaterally,  crackles at bases   Abdomen: Soft, non tender, non distended, bowel sounds present, no guarding  Extremities: pulses DP and PT palpable bilaterally  Neuro: Grossly nonfocal  Data Reviewed: Basic Metabolic Panel:  Recent Labs Lab 03/03/16 0354 03/04/16 0400 03/05/16 0540 03/07/16 0330 03/08/16 0337  NA 145 140 142 142 141  K 3.3* 3.1* 3.9 3.9 3.7  CL 107 109 112* 112* 109  CO2 GLUCOSE 107* 116* 105* 108* 104*  BUN 29* 29*  CREATININE 0.99 1.40* 1.25* 1.31* 1.45*  CALCIUM 10.7* 10.2 9.8 10.1 10.1   Liver Function Tests:  Recent Labs Lab  03/02/16 1421  AST 30  ALT 16*  ALKPHOS 69  BILITOT 1.7*  PROT 6.8  ALBUMIN 3.7   CBC:  Recent Labs Lab 03/02/16 1421  03/03/16 0354 03/05/16 0540 03/06/16 0545 03/07/16 0330 03/08/16 0337  WBC 10.5  --  10.1 10.4 9.4 7.1 8.8  NEUTROABS 7.7  --   --   --   --   --   --   HGB 16.3  < > 15.4 13.3 13.5 13.4 12.9*  HCT 46.1  < > 44.6 39.9 40.3 39.2 39.6  MCV 91.8  --  89.9 93.2 93.3 93.8 95.2  PLT 214  --  219 186 180 178 195  < > = values in this interval not displayed. Cardiac Enzymes:  Recent Labs Lab 03/02/16 1421  TROPONINI 0.04*   Scheduled Meds: . [START ON 03/11/2016] amiodarone  200 mg Oral Daily  . amiodarone  400 mg Oral BID  . aspirin  81 mg Oral Daily  . atorvastatin  80 mg Oral q1800  . carvedilol  25 mg Oral BID WC  . enoxaparin (LOVENOX) injection  1 mg/kg Subcutaneous Q12H  . enoxaparin   Does not apply Once  . feeding supplement (ENSURE ENLIVE)  237 mL Oral BID BM  . hydrALAZINE  25 mg Oral 3 times per day  . polyethylene glycol  17 g Oral Daily  . sodium chloride flush  3 mL Intravenous Q12H  . sodium chloride flush  3 mL Intravenous Q12H   Continuous Infusions:

## 2016-03-08 NOTE — Progress Notes (Signed)
SUBJECTIVE:  Feeling well.  [name] [name] has ambulated without chest pain or shortness of breath.    PHYSICAL EXAM Filed Vitals:   03/07/16 0900 03/07/16 1230 03/07/16 2045 03/08/16 0637  BP: 129/79 114/92 121/62 129/82  Pulse: 70 82 75 70  Temp: 98.2 F (36.8 C) 97.5 F (36.4 C) 98 F (36.7 C) 98.2 F (36.8 C)  TempSrc: Oral Oral Oral Oral  Resp: 18 20 18 18   Height:      Weight:    67.042 kg (147 lb 12.8 oz)  SpO2: 95% 98% 98% 96%   Gen: Well-appearing. No acute distress.  Neck: No JVD at 45. Mild R carotid bruit. COR: RRR. No m/r/g. Normal S1/S2.  Lungs: CTAB. Decreased breath sounds at the right base. Mild bibasilar crackles. Abd: Soft. Non-tender, non-distended.  Ext: WWP. No edema.   LABS: Lab Results  Component Value Date   TROPONINI 0.04* 03/02/2016   Results for orders placed or performed during the hospital encounter of 03/02/16 (from the past 24 hour(s))  CBC     Status: Abnormal   Collection Time: 03/08/16  3:37 AM  Result Value Ref Range   WBC 8.8 4.0 - 10.5 K/uL   RBC 4.16 (L) 4.22 - 5.81 MIL/uL   Hemoglobin 12.9 (L) 13.0 - 17.0 g/dL   HCT 16.139.6 09.639.0 - 04.552.0 %   MCV 95.2 78.0 - 100.0 fL   MCH 31.0 26.0 - 34.0 pg   MCHC 32.6 30.0 - 36.0 g/dL   RDW 40.914.3 81.111.5 - 91.415.5 %   Platelets 195 150 - 400 K/uL  Basic metabolic panel     Status: Abnormal   Collection Time: 03/08/16  3:37 AM  Result Value Ref Range   Sodium 141 135 - 145 mmol/L   Potassium 3.7 3.5 - 5.1 mmol/L   Chloride 109 101 - 111 mmol/L   CO2 25 22 - 32 mmol/L   Glucose, Bld 104 (H) 65 - 99 mg/dL   BUN 29 (H) 6 - 20 mg/dL   Creatinine, Ser 7.821.45 (H) 0.61 - 1.24 mg/dL   Calcium 95.610.1 8.9 - 21.310.3 mg/dL   GFR calc non Af Amer 46 (L) >60 mL/min   GFR calc Af Amer 53 (L) >60 mL/min   Anion gap 7 5 - 15    Intake/Output Summary (Last 24 hours) at 03/08/16 1045 Last data filed at 03/08/16 0801  Gross per 24 hour  Intake    486 ml  Output    201 ml  Net    285 ml    Telemetry:  Atrial fibrillation. Rate 70s to 90s. Pauses up to 2 seconds.  ASSESSMENT AND PLAN:  Principal Problem:   Acute systolic and diastolic CHF (congestive heart failure), NYHA class 1 (HCC) Active Problems:   Hypertensive urgency   Hypokalemia   Bilateral pleural effusion   Acute combined systolic and diastolic heart failure (HCC)   Hypertensive heart disease with heart failure (HCC)   Essential hypertension   CAD in native artery   Bruit   CAD (coronary artery disease)   PAD (peripheral artery disease) (HCC)   Bilateral carotid artery disease (HCC)    # Multivessel CAD: [name] [name] remains asymptomatic. Plan is for coronary artery bypass grafting on Tuesday. He has not had any recurrent chest pain since admission.  Blood pressure better-controlled carvedilol to 25 mg twice a day.  Continue aspirin and atorvastatin.  He is not on an ACE inhibitor or ARB due to acute renal failure  after starting one this admission.  # Carotid artery disease: Left carotid endarterectomy as planned on Tuesday at the same time he has his CABG. Continue aspirin and statin as above.  # Hypertensive heart disease: Blood pressure better-controlled on carvedilol and hydralazine to 25 mg q8h.   # Atrial fibrillation.  Heart rate remains well controlled.  Continue amiodarone 400 mg bid until 3/29, at which time he'll switch to 200 mg daily. Continue carvedilol and heparin.   # Acute systolic and diastolic heart failure: [name] [name] is euvolemic and renal function is starting to rise again.  LVEF is 35-40% with grade 2 diastolic dysfunction on echo.  Reassess renal function and fluid status tomorrow to determine if he needs a diuretic.  Continue carvedilol Consider re-challenging with ACE-I/ARB if renal function remained stable post-operatively.   Time spent: 20 minutes-Greater than 50% of this time was spent in counseling, explanation of diagnosis, planning of further management, and coordination of care.    Darrall Strey C.  Duke Salvia, MD, [facility] 03/08/2016 10:45 AM

## 2016-03-09 DIAGNOSIS — I25118 Atherosclerotic heart disease of native coronary artery with other forms of angina pectoris: Secondary | ICD-10-CM

## 2016-03-09 LAB — CBC
HEMATOCRIT: 39.3 % (ref 39.0–52.0)
Hemoglobin: 12.9 g/dL — ABNORMAL LOW (ref 13.0–17.0)
MCH: 30.7 pg (ref 26.0–34.0)
MCHC: 32.8 g/dL (ref 30.0–36.0)
MCV: 93.6 fL (ref 78.0–100.0)
Platelets: 205 10*3/uL (ref 150–400)
RBC: 4.2 MIL/uL — ABNORMAL LOW (ref 4.22–5.81)
RDW: 14.2 % (ref 11.5–15.5)
WBC: 8.8 10*3/uL (ref 4.0–10.5)

## 2016-03-09 LAB — COMPREHENSIVE METABOLIC PANEL
ALT: 37 U/L (ref 17–63)
AST: 32 U/L (ref 15–41)
Albumin: 3.1 g/dL — ABNORMAL LOW (ref 3.5–5.0)
Alkaline Phosphatase: 62 U/L (ref 38–126)
Anion gap: 10 (ref 5–15)
BUN: 31 mg/dL — ABNORMAL HIGH (ref 6–20)
CO2: 22 mmol/L (ref 22–32)
Calcium: 9.9 mg/dL (ref 8.9–10.3)
Chloride: 107 mmol/L (ref 101–111)
Creatinine, Ser: 1.74 mg/dL — ABNORMAL HIGH (ref 0.61–1.24)
GFR calc Af Amer: 42 mL/min — ABNORMAL LOW (ref >60)
GFR calc non Af Amer: 37 mL/min — ABNORMAL LOW (ref >60)
Glucose, Bld: 125 mg/dL — ABNORMAL HIGH (ref 65–99)
Potassium: 3.7 mmol/L (ref 3.5–5.1)
Sodium: 139 mmol/L (ref 135–145)
Total Bilirubin: 1.1 mg/dL (ref 0.3–1.2)
Total Protein: 5.7 g/dL — ABNORMAL LOW (ref 6.5–8.1)

## 2016-03-09 LAB — BLOOD GAS, ARTERIAL
Acid-Base Excess: 0.9 mmol/L (ref 0.0–2.0)
Bicarbonate: 24 mEq/L (ref 20.0–24.0)
Drawn by: 301361
FIO2: 0.21
O2 Saturation: 95.3 %
Patient temperature: 98.6
TCO2: 25 mmol/L (ref 0–100)
pCO2 arterial: 32.2 mmHg — ABNORMAL LOW (ref 35.0–45.0)
pH, Arterial: 7.486 — ABNORMAL HIGH (ref 7.350–7.450)
pO2, Arterial: 74.7 mmHg — ABNORMAL LOW (ref 80.0–100.0)

## 2016-03-09 LAB — BASIC METABOLIC PANEL
ANION GAP: 8 (ref 5–15)
BUN: 30 mg/dL — ABNORMAL HIGH (ref 6–20)
CALCIUM: 10.1 mg/dL (ref 8.9–10.3)
CO2: 24 mmol/L (ref 22–32)
Chloride: 108 mmol/L (ref 101–111)
Creatinine, Ser: 1.49 mg/dL — ABNORMAL HIGH (ref 0.61–1.24)
GFR calc Af Amer: 51 mL/min — ABNORMAL LOW (ref >60)
GFR calc non Af Amer: 44 mL/min — ABNORMAL LOW (ref >60)
GLUCOSE: 102 mg/dL — AB (ref 65–99)
Potassium: 3.6 mmol/L (ref 3.5–5.1)
Sodium: 140 mmol/L (ref 135–145)

## 2016-03-09 LAB — PREPARE RBC (CROSSMATCH)

## 2016-03-09 MED ORDER — CHLORHEXIDINE GLUCONATE 0.12 % MT SOLN
15.0000 mL | Freq: Once | OROMUCOSAL | Status: AC
  Administered 2016-03-10: 15 mL via OROMUCOSAL
  Filled 2016-03-09: qty 15

## 2016-03-09 MED ORDER — DEXMEDETOMIDINE HCL IN NACL 400 MCG/100ML IV SOLN
0.1000 ug/kg/h | INTRAVENOUS | Status: DC
Start: 2016-03-10 — End: 2016-03-10
  Filled 2016-03-09: qty 100

## 2016-03-09 MED ORDER — METOPROLOL TARTRATE 12.5 MG HALF TABLET
12.5000 mg | ORAL_TABLET | Freq: Once | ORAL | Status: AC
  Administered 2016-03-10: 12.5 mg via ORAL
  Filled 2016-03-09: qty 1

## 2016-03-09 MED ORDER — MAGNESIUM SULFATE 50 % IJ SOLN
40.0000 meq | INTRAMUSCULAR | Status: DC
  Filled 2016-03-09: qty 10

## 2016-03-09 MED ORDER — BISACODYL 5 MG PO TBEC
5.0000 mg | DELAYED_RELEASE_TABLET | Freq: Once | ORAL | Status: DC
  Filled 2016-03-09: qty 1

## 2016-03-09 MED ORDER — HEPARIN SODIUM (PORCINE) 1000 UNIT/ML IJ SOLN
INTRAMUSCULAR | Status: DC
  Filled 2016-03-09: qty 30

## 2016-03-09 MED ORDER — EPINEPHRINE HCL 1 MG/ML IJ SOLN
0.0000 ug/min | INTRAMUSCULAR | Status: DC
  Filled 2016-03-09: qty 4

## 2016-03-09 MED ORDER — DIAZEPAM 5 MG PO TABS
5.0000 mg | ORAL_TABLET | Freq: Once | ORAL | Status: AC
  Administered 2016-03-10: 5 mg via ORAL
  Filled 2016-03-09: qty 1

## 2016-03-09 MED ORDER — ALPRAZOLAM 0.25 MG PO TABS
0.2500 mg | ORAL_TABLET | ORAL | Status: DC | PRN
Start: 2016-03-09 — End: 2016-03-10

## 2016-03-09 MED ORDER — SODIUM CHLORIDE 0.9 % IV SOLN
INTRAVENOUS | Status: DC
  Filled 2016-03-09 (×2): qty 2.5

## 2016-03-09 MED ORDER — VANCOMYCIN HCL 10 G IV SOLR
1250.0000 mg | INTRAVENOUS | Status: DC
  Filled 2016-03-09: qty 1250

## 2016-03-09 MED ORDER — NITROGLYCERIN IN D5W 200-5 MCG/ML-% IV SOLN
2.0000 ug/min | INTRAVENOUS | Status: DC
  Filled 2016-03-09: qty 250

## 2016-03-09 MED ORDER — CHLORHEXIDINE GLUCONATE 4 % EX LIQD
60.0000 mL | Freq: Once | CUTANEOUS | Status: AC
  Filled 2016-03-09: qty 30

## 2016-03-09 MED ORDER — POTASSIUM CHLORIDE 2 MEQ/ML IV SOLN
80.0000 meq | INTRAVENOUS | Status: DC
  Filled 2016-03-09: qty 40

## 2016-03-09 MED ORDER — SODIUM CHLORIDE 0.9 % IV SOLN
INTRAVENOUS | Status: DC
  Filled 2016-03-09: qty 40

## 2016-03-09 MED ORDER — DEXTROSE 5 % IV SOLN
30.0000 ug/min | INTRAVENOUS | Status: DC
  Filled 2016-03-09: qty 2

## 2016-03-09 MED ORDER — TEMAZEPAM 15 MG PO CAPS: 15.0000 mg | ORAL_CAPSULE | Freq: Once | ORAL | Status: DC | PRN

## 2016-03-09 MED ORDER — DEXTROSE 5 % IV SOLN
1.5000 g | INTRAVENOUS | Status: DC
  Filled 2016-03-09: qty 1.5

## 2016-03-09 MED ORDER — DOPAMINE-DEXTROSE 3.2-5 MG/ML-% IV SOLN
0.0000 ug/kg/min | INTRAVENOUS | Status: DC
Start: 2016-03-10 — End: 2016-03-10
  Filled 2016-03-09: qty 250

## 2016-03-09 MED ORDER — PLASMA-LYTE 148 IV SOLN
INTRAVENOUS | Status: DC
  Filled 2016-03-09: qty 2.5

## 2016-03-09 MED ORDER — DEXTROSE 5 % IV SOLN
750.0000 mg | INTRAVENOUS | Status: DC
  Filled 2016-03-09: qty 750

## 2016-03-09 MED ORDER — CHLORHEXIDINE GLUCONATE 4 % EX LIQD
60.0000 mL | Freq: Once | CUTANEOUS | Status: AC
  Administered 2016-03-09: 4 via TOPICAL
  Filled 2016-03-09: qty 15

## 2016-03-09 NOTE — Progress Notes (Signed)
Patient Name: [name] [name] Date of Encounter: 03/09/2016  Principal Problem:   Acute systolic and diastolic CHF (congestive heart failure), NYHA class 1 (HCC) Active Problems:   Hypertensive urgency   Hypokalemia   Bilateral pleural effusion   Acute combined systolic and diastolic heart failure (HCC)   Hypertensive heart disease with heart failure (HCC)   Essential hypertension   CAD in native artery   Bruit   CAD (coronary artery disease)   PAD (peripheral artery disease) (HCC)   Bilateral carotid artery disease (HCC)   Primary Cardiologist:  Patient Profile: [name] [name] is a 75 year old male who has not sought medical care in many years who presented to ED on 03/02/16 with SOB, hypertensive with BP of 220/130, and lower extremity edema. His echo showed EF of 35% with diffuse hypokinesis with akinesis of the basal and midinferolateral walls. Left heart cath on 03/04/16 revealed multivessel disease, he is to have CABG and left carotid endarterectomy on 03/10/16.   SUBJECTIVE: He feels well.  Denies SOB, orthopnea, chest pain.    OBJECTIVE Filed Vitals:   03/08/16 1742 03/08/16 2027 03/09/16 0527 03/09/16 0933  BP: 113/81 111/81 130/78 102/70  Pulse: 63 65 68   Temp:  98.4 F (36.9 C) 98.6 F (37 C)   TempSrc:  Oral Oral   Resp:  16 16   Height:      Weight:   148 lb (67.132 kg)   SpO2:  97% 98%     Intake/Output Summary (Last 24 hours) at 03/09/16 0934 Last data filed at 03/09/16 0911  Gross per 24 hour  Intake   1560 ml  Output    301 ml  Net   1259 ml   Filed Weights   03/07/16 0600 03/08/16 0637 03/09/16 0527  Weight: 148 lb 4.8 oz (67.268 kg) 147 lb 12.8 oz (67.042 kg) 148 lb (67.132 kg)    PHYSICAL EXAM General: Well developed, well nourished, male in no acute distress. Head: Normocephalic, atraumatic.  Neck: Supple without bruits, JVD. Lungs:  Resp regular and unlabored, CTA. Heart: RRR, S1, S2, no S3, S4, or murmur; no rub. Abdomen: Soft,  non-tender, non-distended, BS + x 4.  Extremities: No clubbing, cyanosis, edema.  Neuro: Alert and oriented X 3. Moves all extremities spontaneously. Psych: Normal affect.  LABS: CBC: Recent Labs  03/08/16 0337 03/09/16 0422  WBC 8.8 8.8  HGB 12.9* 12.9*  HCT 39.6 39.3  MCV 95.2 93.6  PLT 195 205   Basic Metabolic Panel: Recent Labs  03/08/16 0337 03/09/16 0422  NA 141 140  K 3.7 3.6  CL 109 108  CO2 25 24  GLUCOSE 104* 102*  BUN 29* 30*  CREATININE 1.45* 1.49*  CALCIUM 10.1 10.1   BNP:  B NATRIURETIC PEPTIDE  Date/Time Value Ref Range Status  03/02/2016 02:21 PM 513.7* 0.0 - 100.0 pg/mL Final   TELE:  Afib, rate in 70's.       Echo: 03/03/16 - Left ventricle: The cavity size was normal. There was mild  concentric hypertrophy. Systolic function was moderately reduced.  The estimated ejection fraction was in the range of 35% to 40%.  Diffuse hypokinesis with akinesis of the basal and mid  inferolateral walls. Features are consistent with a pseudonormal  left ventricular filling pattern, with concomitant abnormal  relaxation and increased filling pressure (grade 2 diastolic  dysfunction). Doppler parameters are consistent with elevated  ventricular end-diastolic filling pressure. - Aortic valve: Structurally normal valve. There was  mild  regurgitation. - Aorta: The aorta was normal, not dilated, and non-diseased. - Mitral valve: Structurally normal valve. There was moderate  regurgitation. - Left atrium: The atrium was mildly dilated. - Right ventricle: The cavity size was normal. Wall thickness was  normal. Systolic function was normal. - Right atrium: The atrium was normal in size. - Tricuspid valve: There was mild regurgitation. - Pulmonary arteries: Systolic pressure was mildly increased. PA  peak pressure: 38 mm Hg (S). - Pericardium, extracardiac: The pericardium was normal in  appearance. There was a left pleural effusion.    Current  Medications:  . [START ON 03/11/2016] amiodarone  200 mg Oral Daily  . amiodarone  400 mg Oral BID  . aspirin  81 mg Oral Daily  . atorvastatin  80 mg Oral q1800  . bisacodyl  5 mg Oral Once  . carvedilol  25 mg Oral BID WC  . chlorhexidine  60 mL Topical Once   And  . [START ON 03/10/2016] chlorhexidine  60 mL Topical Once  . [START ON 03/10/2016] chlorhexidine  15 mL Mouth/Throat Once  . [START ON 03/10/2016] diazepam  5 mg Oral Once  . enoxaparin   Does not apply Once  . feeding supplement (ENSURE ENLIVE)  237 mL Oral BID BM  . hydrALAZINE  25 mg Oral 3 times per day  . [START ON 03/10/2016] metoprolol tartrate  12.5 mg Oral Once  . polyethylene glycol  17 g Oral Daily  . sodium chloride flush  3 mL Intravenous Q12H  . sodium chloride flush  3 mL Intravenous Q12H      ASSESSMENT AND PLAN: Principal Problem:   Acute systolic and diastolic CHF (congestive heart failure), NYHA class 1 (HCC) Active Problems:   Hypertensive urgency   Hypokalemia   Bilateral pleural effusion   Acute combined systolic and diastolic heart failure (HCC)   Hypertensive heart disease with heart failure (HCC)   Essential hypertension   CAD in native artery   Bruit   CAD (coronary artery disease)   PAD (peripheral artery disease) (HCC)   Bilateral carotid artery disease (HCC)  1. Multivessel CAD: [name] [name] remains asymptomatic. Plan is for coronary artery bypass grafting on Tuesday. He has not had any recurrent chest pain since admission. Blood pressure better-controlled carvedilol to 25 mg twice a day. Continue aspirin and atorvastatin. He is not on an ACE inhibitor or ARB due to acute renal failure after starting one this admission.  2.Carotid artery disease: Left carotid endarterectomy as planned on Tuesday at the same time he has his CABG. Continue aspirin and statin as above.   3. Hypertensive heart disease: Blood pressure better-controlled on carvedilol and hydralazine to 25 mg q8h.    4. Atrial  fibrillation. Heart rate remains well controlled. Continue amiodarone 400 mg bid until 3/29, at which time he'll switch to 200 mg daily. Continue carvedilol and heparin.   5. Acute systolic and diastolic heart failure: [name] [name] is euvolemic and renal function is starting to rise again. LVEF is 35-40% with grade 2 diastolic dysfunction on echo. Creatinine is up to 1.49, MD to advise on need for diuretic. Continue carvedilol Consider re-challenging with ACE-I/ARB if renal function remained stable post-operatively.    Signed, Little Ishikawa , NP 9:34 AM 03/09/2016 Pager [phone]  Patient seen and examined. Agree with assessment and plan. No recurrent chest pain. No dyspnea. Reviewed cath findings with patient.  For combined LCEA and CABG tomorrow with Drs. Fields and [employer].  Lennette Biharihomas A. Aubria Vanecek, MD, [facility] Hospital[facility]ACC 03/09/2016 3:20 PM

## 2016-03-09 NOTE — Progress Notes (Signed)
Patient ID: [name] [name], male   DOB: 1941-03-31, 75 y.o.   MRN: [id number]  [facility] PROGRESS NOTE  [name] Deisibor Obst OZH:[id number] DOB: 1941-03-31 DOA: 03/02/2016 PCP: Joycelyn RuaMEYERS, STEPHEN, MD   Brief narrative:    Pt is 75 yo male, functional at baseline, [place] [facility] [place] as his BP was > 220/120. Pt reports he has not seen doctor in years and currently reports only occasional exertional dyspnea and some weight gain but is not sure how many pounds. He denies fevers, chills, chest pain, no abd or urinary concerns, take no BP medications.  In ED, pt noted to be hemodynamically stable, VS notable for SBP 190's and blood work unremarkable. CXR with signs of CHF.  Ct chest with large B/l effusions  Assessment/Plan:    CAD and L carotid artery disease -[facility]CVTS Dr.Van Trigt consulting -80% L carotid artery disease: VVS Dr.Dickson consulting -plan for CABG and L CEA tomorrow -remains stable without symptoms  P.Afib -went into Afib during cath -still in Afib, HR improved still fluctuating -changed to PO amiodarone 3/24, change to 200mg  daily on 3/29 -now on SQ lovenox, stop after todays dose  Hypertensive urgency - continue Coreg, stopped ACEI,  - added po hydralazine - Hold ARB dose due to AKI  Acute systolic and diastolic CHF - was on lasix 40 mg IV BID - EF 35-40% and grade 2 Diastolic dysfunction -negative 400cc only, CT chest with large bilateral effusions -diuretics held due to AKI, now on lasix 40mg  daily  AKI  -due to ACE(lisinopril and diuretics likely) -ACE stopped 3/21 and also held ARB 3/22 -mild rise in creatinine, diuretcs on hold  Hypokalemia - replaced  DVT prophylaxis - Lovenox SQ  Code Status: Full Family Communication:  plan of care discussed with the patient Disposition Plan:per CVTS  IV access:  Peripheral IV  Procedures and diagnostic studies:    Dg Chest 2 View 03/02/2016 Right greater than left basilar  airspace disease could be due to atelectasis or pneumonia. Small pleural effusions are also seen, greater on the right.   Ct Angio Chest Pe W/cm &/or Wo Cm 03/02/2016  Large bilateral pleural effusions with associated partial consolidation in the lower lobes. No evidence of pulmonary emboli.   Medical Consultants:  Cardiology   Other Consultants:  None  IAnti-Infectives:   None  Zannie CoveJOSEPH,Ardella Chhim, MD  [facility] Pager [phone]  If 7PM-7AM, please contact night-coverage [email or web address] Password [email or web address] 03/09/2016, 12:32 PM   LOS: 7 days   HPI/Subjective: Feels ok, no chest pain or dyspnea  Objective: Filed Vitals:   03/08/16 2027 03/09/16 0527 03/09/16 0933 03/09/16 1137  BP: 111/81 130/78 102/70 116/80  Pulse: 65 68  62  Temp: 98.4 F (36.9 C) 98.6 F (37 C)  98.7 F (37.1 C)  TempSrc: Oral Oral  Oral  Resp: 16 16  20   Height:      Weight:  67.132 kg (148 lb)    SpO2: 97% 98%  96%    Intake/Output Summary (Last 24 hours) at 03/09/16 1232 Last data filed at 03/09/16 0911  Gross per 24 hour  Intake   1560 ml  Output    301 ml  Net   1259 ml    Exam:   General:  Pt is alert, follows commands appropriately, not in acute distress  Cardiovascular: Regular rate and rhythm, S1/S2, no murmurs, no rubs, no gallops  Respiratory: Clear to auscultation bilaterally, crackles at bases   Abdomen: Soft, non tender, non  distended, bowel sounds present, no guarding  Extremities: pulses DP and PT palpable bilaterally  Neuro: Grossly nonfocal  Data Reviewed: Basic Metabolic Panel:  Recent Labs Lab 03/05/16 0540 03/07/16 0330 03/08/16 0337 03/09/16 0422 03/09/16 0833  NA 142 142 141 140 139  K 3.9 3.9 3.7 3.6 3.7  CL 112* 112* 109 108 107  CO2 GLUCOSE 105* 108* 104* 102* 125*  BUN 19 29* 29* 30* 31*  CREATININE 1.25* 1.31* 1.45* 1.49* 1.74*  CALCIUM 9.8 10.1 10.1 10.1 9.9   Liver Function Tests:  Recent Labs Lab 03/02/16 1421 03/09/16 0833  AST 30  32  ALT 16* 37  ALKPHOS 69 62  BILITOT 1.7* 1.1  PROT 6.8 5.7*  ALBUMIN 3.7 3.1*   CBC:  Recent Labs Lab 03/02/16 1421  03/05/16 0540 03/06/16 0545 03/07/16 0330 03/08/16 0337 03/09/16 0422  WBC 10.5  < > 10.4 9.4 7.1 8.8 8.8  NEUTROABS 7.7  --   --   --   --   --   --   HGB 16.3  < > 13.3 13.5 13.4 12.9* 12.9*  HCT 46.1  < > 39.9 40.3 39.2 39.6 39.3  MCV 91.8  < > 93.2 93.3 93.8 95.2 93.6  PLT 214  < > 186 180 178 195 205  < > = values in this interval not displayed. Cardiac Enzymes:  Recent Labs Lab 03/02/16 1421  TROPONINI 0.04*   Scheduled Meds: . [START ON 03/11/2016] amiodarone  200 mg Oral Daily  . amiodarone  400 mg Oral BID  . aspirin  81 mg Oral Daily  . atorvastatin  80 mg Oral q1800  . bisacodyl  5 mg Oral Once  . carvedilol  25 mg Oral BID WC  . chlorhexidine  60 mL Topical Once   And  . [START ON 03/10/2016] chlorhexidine  60 mL Topical Once  . [START ON 03/10/2016] chlorhexidine  15 mL Mouth/Throat Once  . [START ON 03/10/2016] diazepam  5 mg Oral Once  . enoxaparin   Does not apply Once  . feeding supplement (ENSURE ENLIVE)  237 mL Oral BID BM  . hydrALAZINE  25 mg Oral 3 times per day  . [START ON 03/10/2016] metoprolol tartrate  12.5 mg Oral Once  . polyethylene glycol  17 g Oral Daily  . sodium chloride flush  3 mL Intravenous Q12H  . sodium chloride flush  3 mL Intravenous Q12H   Continuous Infusions:

## 2016-03-09 NOTE — Progress Notes (Signed)
ANTICOAGULATION CONSULT NOTE  Pharmacy Consult for heparin>> Lovenox Indication: atrial fibrillation , CABG   No Known Allergies  Patient Measurements: Height: 5\' 2"  (157.5 cm) Weight: 148 lb (67.132 kg) (Scale B) IBW/kg (Calculated) : 54.6 Heparin Dosing Weight: 65.6kg  Vital Signs: Temp: 98.6 F (37 C) (03/27 0527) Temp Source: Oral (03/27 0527) BP: 130/78 mmHg (03/27 0527) Pulse Rate: 68 (03/27 0527)  Labs:  Recent Labs  03/07/16 0330 03/08/16 0337 03/09/16 0422  HGB 13.4 12.9* 12.9*  HCT 39.2 39.6 39.3  PLT 178 195 205  CREATININE 1.31* 1.45* 1.49*    Estimated Creatinine Clearance: 36.1 mL/min (by C-G formula based on Cr of 1.49).  Assessment: [age] YOM s/p cath on 3/22 with sig vessel disease, went into AFib during procedure. Plan for CABG 3/28.   Goal of Therapy:  Heparin level 0.3-0.7 units/ml Monitor platelets by anticoagulation protocol: Yes   Plan:  CABG planned for 3/28 Lovenox at 65 mg sq Q 12 hours  Last dose at 7 am this morning -- will hold tonight's dose in preparation for surgery  Thank you Okey RegalLisa Tre Sanker, PharmD [phone]  -03/09/2016 8:49 AM

## 2016-03-09 NOTE — Progress Notes (Signed)
5 Days Post-Op Procedure(s) (LRB): Right/Left Heart Cath and Coronary Angiography (N/A) Subjective: No angina Chronic afib on po amiodarone Combined CABG- L carotid planned for am Objective: Vital signs in last 24 hours: Temp:  [98.4 F (36.9 C)-98.7 F (37.1 C)] 98.7 F (37.1 C) (03/27 1137) Pulse Rate:  [62-68] 62 (03/27 1137) Cardiac Rhythm:  [-] Atrial fibrillation (03/27 0815) Resp:  [16-20] 20 (03/27 1137) BP: (102-130)/(70-81) 116/80 mmHg (03/27 1137) SpO2:  [96 %-98 %] 96 % (03/27 1137) Weight:  [148 lb (67.132 kg)] 148 lb (67.132 kg) (03/27 0527)  Hemodynamic parameters for last 24 hours:  stable  Intake/Output from previous day: 03/26 0701 - 03/27 0700 In: 1443 [P.O.:1440; I.V.:3] Out: 500 [Urine:500] Intake/Output this shift: Total I/O In: 600 [P.O.:600] Out: 1 [Urine:1]       Exam    General- alert and comfortable   Lungs- clear without rales, wheezes   Cor- regular rate and rhythm, no murmur , gallop   Abdomen- soft, non-tender   Extremities - warm, non-tender, minimal edema   Neuro- oriented, appropriate, no focal weakness   Lab Results:  Recent Labs  03/08/16 0337 03/09/16 0422  WBC 8.8 8.8  HGB 12.9* 12.9*  HCT 39.6 39.3  PLT 195 205   BMET:  Recent Labs  03/09/16 0422 03/09/16 0833  NA 140 139  K 3.6 3.7  CL 108 107  CO2 24 22  GLUCOSE 102* 125*  BUN 30* 31*  CREATININE 1.49* 1.74*  CALCIUM 10.1 9.9    PT/INR: No results for input(s): LABPROT, INR in the last 72 hours. ABG    Component Value Date/Time   PHART 7.486* 03/09/2016 0940   HCO3 24.0 03/09/2016 0940   TCO2 25.0 03/09/2016 0940   O2SAT 95.3 03/09/2016 0940   CBG (last 3)  No results for input(s): GLUCAP in the last 72 hours.  Assessment/Plan: S/P Procedure(s) (LRB): Right/Left Heart Cath and Coronary Angiography (N/A) Plan OR in am, procedure benefits and risks discussed with the patient   LOS: 7 days    Kathlee Nationseter Van Trigt III 03/09/2016

## 2016-03-09 NOTE — Care Management Important Message (Signed)
Important Message  Patient Details  Name: [name] [name] MRN: [id number] Date of Birth: 10-Mar-1941   Medicare Important Message Given:  Yes    [name] [name], [name] [name] 03/09/2016, 12:51 PM

## 2016-03-09 NOTE — Progress Notes (Signed)
CARDIAC REHAB PHASE I   PRE:  Rate/Rhythm: 81 afib  BP:  Supine:   Sitting: 104/70  Standing:    SaO2: 96%RA  MODE:  Ambulation: 660 ft   POST:  Rate/Rhythm: 112 afib  BP:  Supine:   Sitting: 146/80  Standing:    SaO2: 99%RA 1121-1145 Pt walked 660 ft with steady gait. No CP. Tolerated well. Answered few questions of wife and wrote down visiting hours for 2S.  Will see pt after surgery. Pt has seen video and wife has read booklet.   Luetta Nuttingharlene Takita Riecke, RN BSN  03/09/2016 11:41 AM

## 2016-03-10 ENCOUNTER — Other Ambulatory Visit: Payer: Self-pay

## 2016-03-10 ENCOUNTER — Inpatient Hospital Stay (HOSPITAL_COMMUNITY): Payer: Commercial Managed Care - HMO

## 2016-03-10 ENCOUNTER — Inpatient Hospital Stay (HOSPITAL_COMMUNITY): Payer: Commercial Managed Care - HMO | Admitting: Certified Registered Nurse Anesthetist

## 2016-03-10 ENCOUNTER — Inpatient Hospital Stay (HOSPITAL_COMMUNITY)
Admit: 2016-03-10 | Discharge: 2016-03-10 | Disposition: A | Discharge status: Home / Self Care | Payer: Commercial Managed Care - HMO | Attending: Cardiothoracic Surgery | Admitting: Cardiothoracic Surgery

## 2016-03-10 ENCOUNTER — Encounter (HOSPITAL_COMMUNITY): Payer: Self-pay | Admitting: Certified Registered Nurse Anesthetist

## 2016-03-10 ENCOUNTER — Encounter (HOSPITAL_COMMUNITY)
Admission: EM | Disposition: A | Discharge status: Home Health | Payer: Self-pay | Source: Home / Self Care | Attending: Cardiothoracic Surgery

## 2016-03-10 DIAGNOSIS — Z951 Presence of aortocoronary bypass graft: Secondary | ICD-10-CM

## 2016-03-10 DIAGNOSIS — I25110 Atherosclerotic heart disease of native coronary artery with unstable angina pectoris: Secondary | ICD-10-CM

## 2016-03-10 HISTORY — PX: CLIPPING OF ATRIAL APPENDAGE: SHX5773

## 2016-03-10 HISTORY — PX: ENDARTERECTOMY: SHX5162

## 2016-03-10 HISTORY — PX: TEE WITHOUT CARDIOVERSION: SHX5443

## 2016-03-10 HISTORY — PX: CORONARY ARTERY BYPASS GRAFT: SHX141

## 2016-03-10 LAB — POCT I-STAT, CHEM 8
BUN: 29 mg/dL — ABNORMAL HIGH (ref 6–20)
BUN: 30 mg/dL — AB (ref 6–20)
BUN: 17 mg/dL (ref 6–20)
BUN: 25 mg/dL — AB (ref 6–20)
BUN: 28 mg/dL — ABNORMAL HIGH (ref 6–20)
BUN: 25 mg/dL — AB (ref 6–20)
BUN: 27 mg/dL — AB (ref 6–20)
BUN: 26 mg/dL — AB (ref 6–20)
CALCIUM ION: 1.07 mmol/L — AB (ref 1.13–1.30)
CALCIUM ION: 1.07 mmol/L — AB (ref 1.13–1.30)
CALCIUM ION: 1.14 mmol/L (ref 1.13–1.30)
CHLORIDE: 103 mmol/L (ref 101–111)
CHLORIDE: 103 mmol/L (ref 101–111)
CHLORIDE: 102 mmol/L (ref 101–111)
CHLORIDE: 98 mmol/L — AB (ref 101–111)
CHLORIDE: 102 mmol/L (ref 101–111)
CREATININE: 0.5 mg/dL — AB (ref 0.61–1.24)
CREATININE: 0.9 mg/dL (ref 0.61–1.24)
CREATININE: 0.9 mg/dL (ref 0.61–1.24)
CREATININE: 0.9 mg/dL (ref 0.61–1.24)
CREATININE: 1 mg/dL (ref 0.61–1.24)
Calcium, Ion: 1.39 mmol/L — ABNORMAL HIGH (ref 1.13–1.30)
Calcium, Ion: 1.33 mmol/L — ABNORMAL HIGH (ref 1.13–1.30)
Calcium, Ion: 0.94 mmol/L — ABNORMAL LOW (ref 1.13–1.30)
Calcium, Ion: 1.04 mmol/L — ABNORMAL LOW (ref 1.13–1.30)
Calcium, Ion: 1.29 mmol/L (ref 1.13–1.30)
Chloride: 100 mmol/L — ABNORMAL LOW (ref 101–111)
Chloride: 114 mmol/L — ABNORMAL HIGH (ref 101–111)
Chloride: 96 mmol/L — ABNORMAL LOW (ref 101–111)
Creatinine, Ser: 1.2 mg/dL (ref 0.61–1.24)
Creatinine, Ser: 1.1 mg/dL (ref 0.61–1.24)
Creatinine, Ser: 1 mg/dL (ref 0.61–1.24)
GLUCOSE: 105 mg/dL — AB (ref 65–99)
GLUCOSE: 128 mg/dL — AB (ref 65–99)
GLUCOSE: 110 mg/dL — AB (ref 65–99)
GLUCOSE: 117 mg/dL — AB (ref 65–99)
GLUCOSE: 111 mg/dL — AB (ref 65–99)
Glucose, Bld: 123 mg/dL — ABNORMAL HIGH (ref 65–99)
Glucose, Bld: 142 mg/dL — ABNORMAL HIGH (ref 65–99)
Glucose, Bld: 90 mg/dL (ref 65–99)
HCT: 35 % — ABNORMAL LOW (ref 39.0–52.0)
HCT: 22 % — ABNORMAL LOW (ref 39.0–52.0)
HCT: 31 % — ABNORMAL LOW (ref 39.0–52.0)
HCT: 21 % — ABNORMAL LOW (ref 39.0–52.0)
HCT: 23 % — ABNORMAL LOW (ref 39.0–52.0)
HCT: 26 % — ABNORMAL LOW (ref 39.0–52.0)
HEMATOCRIT: 33 % — AB (ref 39.0–52.0)
HEMATOCRIT: 21 % — AB (ref 39.0–52.0)
HEMOGLOBIN: 7.5 g/dL — AB (ref 13.0–17.0)
Hemoglobin: 11.9 g/dL — ABNORMAL LOW (ref 13.0–17.0)
Hemoglobin: 11.2 g/dL — ABNORMAL LOW (ref 13.0–17.0)
Hemoglobin: 10.5 g/dL — ABNORMAL LOW (ref 13.0–17.0)
Hemoglobin: 7.1 g/dL — ABNORMAL LOW (ref 13.0–17.0)
Hemoglobin: 7.1 g/dL — ABNORMAL LOW (ref 13.0–17.0)
Hemoglobin: 7.8 g/dL — ABNORMAL LOW (ref 13.0–17.0)
Hemoglobin: 8.8 g/dL — ABNORMAL LOW (ref 13.0–17.0)
POTASSIUM: 3.8 mmol/L (ref 3.5–5.1)
POTASSIUM: 2.1 mmol/L — AB (ref 3.5–5.1)
POTASSIUM: 3.6 mmol/L (ref 3.5–5.1)
POTASSIUM: 4.5 mmol/L (ref 3.5–5.1)
POTASSIUM: 4 mmol/L (ref 3.5–5.1)
Potassium: 3.6 mmol/L (ref 3.5–5.1)
Potassium: 4.4 mmol/L (ref 3.5–5.1)
Potassium: 4.7 mmol/L (ref 3.5–5.1)
SODIUM: 140 mmol/L (ref 135–145)
SODIUM: 139 mmol/L (ref 135–145)
SODIUM: 133 mmol/L — AB (ref 135–145)
Sodium: 140 mmol/L (ref 135–145)
Sodium: 141 mmol/L (ref 135–145)
Sodium: 147 mmol/L — ABNORMAL HIGH (ref 135–145)
Sodium: 136 mmol/L (ref 135–145)
Sodium: 138 mmol/L (ref 135–145)
TCO2: 25 mmol/L (ref 0–100)
TCO2: 27 mmol/L (ref 0–100)
TCO2: 18 mmol/L (ref 0–100)
TCO2: 28 mmol/L (ref 0–100)
TCO2: 29 mmol/L (ref 0–100)
TCO2: 24 mmol/L (ref 0–100)
TCO2: 27 mmol/L (ref 0–100)
TCO2: 25 mmol/L (ref 0–100)

## 2016-03-10 LAB — GLUCOSE, CAPILLARY
Glucose-Capillary: 76 mg/dL (ref 65–99)
Glucose-Capillary: 97 mg/dL (ref 65–99)
Glucose-Capillary: 99 mg/dL (ref 65–99)

## 2016-03-10 LAB — BASIC METABOLIC PANEL
ANION GAP: 9 (ref 5–15)
BUN: 34 mg/dL — ABNORMAL HIGH (ref 6–20)
CALCIUM: 10.1 mg/dL (ref 8.9–10.3)
CO2: 23 mmol/L (ref 22–32)
Chloride: 108 mmol/L (ref 101–111)
Creatinine, Ser: 1.63 mg/dL — ABNORMAL HIGH (ref 0.61–1.24)
GFR, EST AFRICAN AMERICAN: 46 mL/min — AB (ref >60)
GFR, EST NON AFRICAN AMERICAN: 40 mL/min — AB (ref >60)
Glucose, Bld: 101 mg/dL — ABNORMAL HIGH (ref 65–99)
POTASSIUM: 3.6 mmol/L (ref 3.5–5.1)
SODIUM: 140 mmol/L (ref 135–145)

## 2016-03-10 LAB — PLATELET COUNT: Platelets: 62 10*3/uL — ABNORMAL LOW (ref 150–400)

## 2016-03-10 LAB — POCT I-STAT 3, ART BLOOD GAS (G3+)
Acid-Base Excess: 2 mmol/L (ref 0.0–2.0)
Acid-base deficit: 3 mmol/L — ABNORMAL HIGH (ref 0.0–2.0)
Acid-base deficit: 1 mmol/L (ref 0.0–2.0)
Acid-base deficit: 1 mmol/L (ref 0.0–2.0)
BICARBONATE: 26.8 meq/L — AB (ref 20.0–24.0)
BICARBONATE: 22.2 meq/L (ref 20.0–24.0)
BICARBONATE: 24.5 meq/L — AB (ref 20.0–24.0)
Bicarbonate: 25.3 mEq/L — ABNORMAL HIGH (ref 20.0–24.0)
O2 SAT: 100 %
O2 SAT: 92 %
O2 SAT: 96 %
O2 Saturation: 100 %
PCO2 ART: 39.6 mmHg (ref 35.0–45.0)
PCO2 ART: 40.8 mmHg (ref 35.0–45.0)
PCO2 ART: 52.7 mmHg — AB (ref 35.0–45.0)
PH ART: 7.434 (ref 7.350–7.450)
PH ART: 7.357 (ref 7.350–7.450)
PH ART: 7.289 — AB (ref 7.350–7.450)
PO2 ART: 403 mmHg — AB (ref 80.0–100.0)
PO2 ART: 59 mmHg — AB (ref 80.0–100.0)
PO2 ART: 89 mmHg (ref 80.0–100.0)
Patient temperature: 35.2
Patient temperature: 37
TCO2: 28 mmol/L (ref 0–100)
TCO2: 23 mmol/L (ref 0–100)
TCO2: 26 mmol/L (ref 0–100)
TCO2: 27 mmol/L (ref 0–100)
pCO2 arterial: 40 mmHg (ref 35.0–45.0)
pH, Arterial: 7.379 (ref 7.350–7.450)
pO2, Arterial: 370 mmHg — ABNORMAL HIGH (ref 80.0–100.0)

## 2016-03-10 LAB — PROTIME-INR
INR: 2 — AB (ref 0.00–1.49)
Prothrombin Time: 22.6 seconds — ABNORMAL HIGH (ref 11.6–15.2)

## 2016-03-10 LAB — POCT I-STAT 4, (NA,K, GLUC, HGB,HCT)
Glucose, Bld: 103 mg/dL — ABNORMAL HIGH (ref 65–99)
HEMATOCRIT: 31 % — AB (ref 39.0–52.0)
HEMOGLOBIN: 10.5 g/dL — AB (ref 13.0–17.0)
Potassium: 3.7 mmol/L (ref 3.5–5.1)
SODIUM: 139 mmol/L (ref 135–145)

## 2016-03-10 LAB — CBC
HCT: 39.6 % (ref 39.0–52.0)
HEMATOCRIT: 28.4 % — AB (ref 39.0–52.0)
HEMOGLOBIN: 9.5 g/dL — AB (ref 13.0–17.0)
Hemoglobin: 13.1 g/dL (ref 13.0–17.0)
MCH: 31.2 pg (ref 26.0–34.0)
MCH: 30 pg (ref 26.0–34.0)
MCHC: 33.1 g/dL (ref 30.0–36.0)
MCHC: 33.5 g/dL (ref 30.0–36.0)
MCV: 94.3 fL (ref 78.0–100.0)
MCV: 89.6 fL (ref 78.0–100.0)
Platelets: 196 10*3/uL (ref 150–400)
Platelets: 63 10*3/uL — ABNORMAL LOW (ref 150–400)
RBC: 4.2 MIL/uL — ABNORMAL LOW (ref 4.22–5.81)
RBC: 3.17 MIL/uL — ABNORMAL LOW (ref 4.22–5.81)
RDW: 14.1 % (ref 11.5–15.5)
RDW: 16.1 % — AB (ref 11.5–15.5)
WBC: 9.5 10*3/uL (ref 4.0–10.5)
WBC: 21.2 10*3/uL — ABNORMAL HIGH (ref 4.0–10.5)

## 2016-03-10 LAB — HEMOGLOBIN AND HEMATOCRIT, BLOOD
HCT: 26.1 % — ABNORMAL LOW (ref 39.0–52.0)
Hemoglobin: 8.8 g/dL — ABNORMAL LOW (ref 13.0–17.0)

## 2016-03-10 LAB — SURGICAL PCR SCREEN
MRSA, PCR: NEGATIVE
Staphylococcus aureus: NEGATIVE

## 2016-03-10 LAB — PREPARE RBC (CROSSMATCH)

## 2016-03-10 LAB — APTT: aPTT: 38 seconds — ABNORMAL HIGH (ref 24–37)

## 2016-03-10 SURGERY — CORONARY ARTERY BYPASS GRAFTING (CABG)
Anesthesia: General | Site: Chest

## 2016-03-10 MED ORDER — METOPROLOL TARTRATE 25 MG/10 ML ORAL SUSPENSION: 12.5000 mg | Freq: Two times a day (BID) | ORAL | Status: DC

## 2016-03-10 MED ORDER — ALBUMIN HUMAN 5 % IV SOLN
INTRAVENOUS | Status: DC | PRN
  Administered 2016-03-10 (×2): via INTRAVENOUS

## 2016-03-10 MED ORDER — BISACODYL 5 MG PO TBEC
10.0000 mg | DELAYED_RELEASE_TABLET | Freq: Every day | ORAL | Status: DC
  Administered 2016-03-11 – 2016-03-19 (×7): 10 mg via ORAL
  Filled 2016-03-10 (×7): qty 2

## 2016-03-10 MED ORDER — LACTATED RINGERS IV SOLN
INTRAVENOUS | Status: DC | PRN
  Administered 2016-03-10: 07:00:00 via INTRAVENOUS

## 2016-03-10 MED ORDER — NITROGLYCERIN IN D5W 200-5 MCG/ML-% IV SOLN
INTRAVENOUS | Status: DC | PRN
  Administered 2016-03-10: 5 ug/min via INTRAVENOUS

## 2016-03-10 MED ORDER — LIDOCAINE HCL (PF) 1 % IJ SOLN
INTRAMUSCULAR | Status: AC
  Filled 2016-03-10: qty 30

## 2016-03-10 MED ORDER — SUCCINYLCHOLINE CHLORIDE 20 MG/ML IJ SOLN
INTRAMUSCULAR | Status: AC
  Filled 2016-03-10: qty 1

## 2016-03-10 MED ORDER — DOPAMINE-DEXTROSE 3.2-5 MG/ML-% IV SOLN
0.0000 ug/kg/min | INTRAVENOUS | Status: DC
  Administered 2016-03-12: 3 ug/kg/min via INTRAVENOUS
  Filled 2016-03-10: qty 250

## 2016-03-10 MED ORDER — BISACODYL 10 MG RE SUPP: 10.0000 mg | Freq: Every day | RECTAL | Status: DC

## 2016-03-10 MED ORDER — SODIUM CHLORIDE 0.9 % IV SOLN
INTRAVENOUS | Status: DC
  Filled 2016-03-10 (×2): qty 2.5

## 2016-03-10 MED ORDER — SODIUM CHLORIDE 0.9% FLUSH: 3.0000 mL | INTRAVENOUS | Status: DC | PRN

## 2016-03-10 MED ORDER — SODIUM CHLORIDE 0.9 % IV SOLN: 250.0000 mL | INTRAVENOUS | Status: DC

## 2016-03-10 MED ORDER — CALCIUM CHLORIDE 10 % IV SOLN
1.0000 g | Freq: Once | INTRAVENOUS | Status: AC
  Administered 2016-03-10: 1 g via INTRAVENOUS

## 2016-03-10 MED ORDER — GLYCOPYRROLATE 0.2 MG/ML IJ SOLN
INTRAMUSCULAR | Status: DC | PRN
  Administered 2016-03-10 (×2): .2 mg via INTRAVENOUS

## 2016-03-10 MED ORDER — NOREPINEPHRINE BITARTRATE 1 MG/ML IV SOLN
0.0000 ug/min | INTRAVENOUS | Status: DC
  Administered 2016-03-11: 2 ug/min via INTRAVENOUS
  Filled 2016-03-10: qty 4

## 2016-03-10 MED ORDER — EPHEDRINE SULFATE 50 MG/ML IJ SOLN
INTRAMUSCULAR | Status: DC | PRN
  Administered 2016-03-10: 15 mg via INTRAVENOUS
  Administered 2016-03-10: 10 mg via INTRAVENOUS
  Administered 2016-03-10: 15 mg via INTRAVENOUS
  Administered 2016-03-10 (×3): 5 mg via INTRAVENOUS
  Administered 2016-03-10: 15 mg via INTRAVENOUS
  Administered 2016-03-10: 5 mg via INTRAVENOUS
  Administered 2016-03-10: 10 mg via INTRAVENOUS

## 2016-03-10 MED ORDER — FUROSEMIDE 10 MG/ML IJ SOLN
INTRAMUSCULAR | Status: DC | PRN
  Administered 2016-03-10: 20 mg via INTRAMUSCULAR

## 2016-03-10 MED ORDER — ROCURONIUM BROMIDE 50 MG/5ML IV SOLN
INTRAVENOUS | Status: AC
Start: 2016-03-10 — End: 2016-03-10
  Filled 2016-03-10: qty 2

## 2016-03-10 MED ORDER — MIDAZOLAM HCL 10 MG/2ML IJ SOLN
INTRAMUSCULAR | Status: AC
  Filled 2016-03-10: qty 2

## 2016-03-10 MED ORDER — FENTANYL CITRATE (PF) 100 MCG/2ML IJ SOLN
INTRAMUSCULAR | Status: DC | PRN
  Administered 2016-03-10: 100 ug via INTRAVENOUS
  Administered 2016-03-10: 150 ug via INTRAVENOUS
  Administered 2016-03-10: 100 ug via INTRAVENOUS
  Administered 2016-03-10: 50 ug via INTRAVENOUS
  Administered 2016-03-10: 250 ug via INTRAVENOUS
  Administered 2016-03-10: 50 ug via INTRAVENOUS
  Administered 2016-03-10 (×2): 150 ug via INTRAVENOUS
  Administered 2016-03-10: 100 ug via INTRAVENOUS
  Administered 2016-03-10: 50 ug via INTRAVENOUS
  Administered 2016-03-10: 100 ug via INTRAVENOUS
  Administered 2016-03-10: 150 ug via INTRAVENOUS
  Administered 2016-03-10: 100 ug via INTRAVENOUS

## 2016-03-10 MED ORDER — GLYCOPYRROLATE 0.2 MG/ML IJ SOLN
INTRAMUSCULAR | Status: AC
  Filled 2016-03-10: qty 1

## 2016-03-10 MED ORDER — SODIUM CHLORIDE 0.9 % IJ SOLN
INTRAMUSCULAR | Status: AC
  Filled 2016-03-10: qty 10

## 2016-03-10 MED ORDER — METOPROLOL TARTRATE 1 MG/ML IV SOLN
2.5000 mg | INTRAVENOUS | Status: DC | PRN
  Administered 2016-03-13 – 2016-03-14 (×2): 2.5 mg via INTRAVENOUS
  Filled 2016-03-10 (×2): qty 5

## 2016-03-10 MED ORDER — VANCOMYCIN HCL IN DEXTROSE 1-5 GM/200ML-% IV SOLN
1000.0000 mg | Freq: Once | INTRAVENOUS | Status: DC
  Filled 2016-03-10: qty 200

## 2016-03-10 MED ORDER — PROPOFOL 10 MG/ML IV BOLUS
INTRAVENOUS | Status: AC
  Filled 2016-03-10: qty 20

## 2016-03-10 MED ORDER — SODIUM CHLORIDE 0.9 % IV SOLN
INTRAVENOUS | Status: DC | PRN
  Administered 2016-03-10 (×2): via INTRAVENOUS

## 2016-03-10 MED ORDER — SODIUM CHLORIDE 0.9 % IV SOLN
20.0000 ug | INTRAVENOUS | Status: AC
  Administered 2016-03-10: 20 ug via INTRAVENOUS
  Filled 2016-03-10: qty 5

## 2016-03-10 MED ORDER — MORPHINE SULFATE (PF) 2 MG/ML IV SOLN
2.0000 mg | INTRAVENOUS | Status: DC | PRN
  Administered 2016-03-12: 2 mg via INTRAVENOUS
  Filled 2016-03-10: qty 1

## 2016-03-10 MED ORDER — LEVALBUTEROL HCL 1.25 MG/0.5ML IN NEBU
1.2500 mg | INHALATION_SOLUTION | Freq: Four times a day (QID) | RESPIRATORY_TRACT | Status: DC
  Administered 2016-03-10: 1.25 mg via RESPIRATORY_TRACT

## 2016-03-10 MED ORDER — PHENYLEPHRINE HCL 10 MG/ML IJ SOLN
INTRAMUSCULAR | Status: DC | PRN
  Administered 2016-03-10: 180 ug via INTRAVENOUS

## 2016-03-10 MED ORDER — PROTAMINE SULFATE 10 MG/ML IV SOLN
INTRAVENOUS | Status: DC | PRN
  Administered 2016-03-10: 240 mg via INTRAVENOUS
  Administered 2016-03-10: 10 mg via INTRAVENOUS

## 2016-03-10 MED ORDER — MILRINONE IN DEXTROSE 20 MG/100ML IV SOLN
0.1250 ug/kg/min | INTRAVENOUS | Status: AC
  Administered 2016-03-10: .25 ug/kg/min via INTRAVENOUS
  Filled 2016-03-10: qty 100

## 2016-03-10 MED ORDER — ARTIFICIAL TEARS OP OINT
TOPICAL_OINTMENT | OPHTHALMIC | Status: DC | PRN
  Administered 2016-03-10: 1 via OPHTHALMIC

## 2016-03-10 MED ORDER — SODIUM CHLORIDE 0.9 % IV SOLN
0.5000 g/h | Freq: Once | INTRAVENOUS | Status: DC
  Filled 2016-03-10: qty 20

## 2016-03-10 MED ORDER — MIDAZOLAM HCL 2 MG/2ML IJ SOLN
INTRAMUSCULAR | Status: AC
  Filled 2016-03-10: qty 2

## 2016-03-10 MED ORDER — AMINOCAPROIC ACID 250 MG/ML IV SOLN
10.0000 g | INTRAVENOUS | Status: DC | PRN
  Administered 2016-03-10: 5 g/h via INTRAVENOUS

## 2016-03-10 MED ORDER — DOCUSATE SODIUM 100 MG PO CAPS
200.0000 mg | ORAL_CAPSULE | Freq: Every day | ORAL | Status: DC
  Administered 2016-03-11 – 2016-03-18 (×7): 200 mg via ORAL
  Filled 2016-03-10 (×9): qty 2

## 2016-03-10 MED ORDER — ROCURONIUM BROMIDE 50 MG/5ML IV SOLN
INTRAVENOUS | Status: AC
  Filled 2016-03-10: qty 2

## 2016-03-10 MED ORDER — ALBUMIN HUMAN 5 % IV SOLN
250.0000 mL | INTRAVENOUS | Status: DC | PRN
  Administered 2016-03-10: 250 mL via INTRAVENOUS

## 2016-03-10 MED ORDER — ACETAMINOPHEN 500 MG PO TABS
1000.0000 mg | ORAL_TABLET | Freq: Four times a day (QID) | ORAL | Status: AC
  Administered 2016-03-12 – 2016-03-14 (×10): 1000 mg via ORAL
  Filled 2016-03-10 (×10): qty 2

## 2016-03-10 MED ORDER — NITROGLYCERIN IN D5W 200-5 MCG/ML-% IV SOLN: 0.0000 ug/min | INTRAVENOUS | Status: DC

## 2016-03-10 MED ORDER — PROPOFOL 10 MG/ML IV BOLUS
INTRAVENOUS | Status: DC | PRN
  Administered 2016-03-10: 30 mg via INTRAVENOUS
  Administered 2016-03-10 (×2): 100 mg via INTRAVENOUS
  Administered 2016-03-10: 20 mg via INTRAVENOUS

## 2016-03-10 MED ORDER — FENTANYL CITRATE (PF) 250 MCG/5ML IJ SOLN
INTRAMUSCULAR | Status: AC
  Filled 2016-03-10: qty 5

## 2016-03-10 MED ORDER — ROCURONIUM BROMIDE 50 MG/5ML IV SOLN
INTRAVENOUS | Status: AC
  Filled 2016-03-10: qty 1

## 2016-03-10 MED ORDER — LACTATED RINGERS IV SOLN: INTRAVENOUS | Status: DC

## 2016-03-10 MED ORDER — CHLORHEXIDINE GLUCONATE 4 % EX LIQD
CUTANEOUS | Status: AC
Start: 2016-03-10 — End: 2016-03-10
  Administered 2016-03-10: 04:00:00
  Filled 2016-03-10: qty 15

## 2016-03-10 MED ORDER — HEMOSTATIC AGENTS (NO CHARGE) OPTIME
TOPICAL | Status: DC | PRN
Start: 2016-03-10 — End: 2016-03-10
  Administered 2016-03-10: 1 via TOPICAL

## 2016-03-10 MED ORDER — ACETAMINOPHEN 160 MG/5ML PO SOLN: 650.0000 mg | Freq: Once | ORAL | Status: AC

## 2016-03-10 MED ORDER — ROCURONIUM BROMIDE 100 MG/10ML IV SOLN
INTRAVENOUS | Status: DC | PRN
  Administered 2016-03-10 (×3): 50 mg via INTRAVENOUS
  Administered 2016-03-10: 40 mg via INTRAVENOUS
  Administered 2016-03-10: 50 mg via INTRAVENOUS
  Administered 2016-03-10: 60 mg via INTRAVENOUS
  Administered 2016-03-10: 30 mg via INTRAVENOUS

## 2016-03-10 MED ORDER — ALBUMIN HUMAN 5 % IV SOLN
250.0000 mL | INTRAVENOUS | Status: AC | PRN
Start: 2016-03-10 — End: 2016-03-11

## 2016-03-10 MED ORDER — LACTATED RINGERS IV SOLN
INTRAVENOUS | Status: DC
  Administered 2016-03-10: 18:00:00 via INTRAVENOUS

## 2016-03-10 MED ORDER — VANCOMYCIN HCL IN DEXTROSE 1-5 GM/200ML-% IV SOLN
1000.0000 mg | Freq: Two times a day (BID) | INTRAVENOUS | Status: AC
  Administered 2016-03-10 – 2016-03-11 (×2): 1000 mg via INTRAVENOUS
  Filled 2016-03-10 (×2): qty 200

## 2016-03-10 MED ORDER — LACTATED RINGERS IV SOLN: 500.0000 mL | Freq: Once | INTRAVENOUS | Status: DC | PRN

## 2016-03-10 MED ORDER — SODIUM CHLORIDE 0.9% FLUSH
3.0000 mL | Freq: Two times a day (BID) | INTRAVENOUS | Status: DC
  Administered 2016-03-11 – 2016-03-24 (×25): 3 mL via INTRAVENOUS

## 2016-03-10 MED ORDER — THROMBIN 20000 UNITS EX SOLR
CUTANEOUS | Status: DC | PRN
  Administered 2016-03-10: 12:00:00 via TOPICAL

## 2016-03-10 MED ORDER — SODIUM CHLORIDE 0.9 % IJ SOLN
INTRAMUSCULAR | Status: DC | PRN
  Administered 2016-03-10: 4 mL via TOPICAL

## 2016-03-10 MED ORDER — MIDAZOLAM HCL 2 MG/2ML IJ SOLN: 2.0000 mg | INTRAMUSCULAR | Status: DC | PRN

## 2016-03-10 MED ORDER — PROTAMINE SULFATE 10 MG/ML IV SOLN
INTRAVENOUS | Status: AC
  Filled 2016-03-10: qty 25

## 2016-03-10 MED ORDER — AMIODARONE HCL IN DEXTROSE 360-4.14 MG/200ML-% IV SOLN
30.0000 mg/h | INTRAVENOUS | Status: DC
Start: 2016-03-10 — End: 2016-03-12
  Administered 2016-03-11 – 2016-03-12 (×3): 30 mg/h via INTRAVENOUS
  Filled 2016-03-10 (×3): qty 200

## 2016-03-10 MED ORDER — TRAMADOL HCL 50 MG PO TABS
50.0000 mg | ORAL_TABLET | ORAL | Status: DC | PRN
  Administered 2016-03-20 (×2): 50 mg via ORAL
  Filled 2016-03-10 (×2): qty 1

## 2016-03-10 MED ORDER — THROMBIN 20000 UNITS EX SOLR
CUTANEOUS | Status: AC
  Filled 2016-03-10: qty 20000

## 2016-03-10 MED ORDER — ACETAMINOPHEN 160 MG/5ML PO SOLN
1000.0000 mg | Freq: Four times a day (QID) | ORAL | Status: AC
  Administered 2016-03-15 (×3): 1000 mg
  Filled 2016-03-10 (×3): qty 40.6

## 2016-03-10 MED ORDER — DEXMEDETOMIDINE HCL IN NACL 200 MCG/50ML IV SOLN
0.0000 ug/kg/h | INTRAVENOUS | Status: DC
  Administered 2016-03-10: 0.7 ug/kg/h via INTRAVENOUS
  Filled 2016-03-10 (×2): qty 50

## 2016-03-10 MED ORDER — ASPIRIN 81 MG PO CHEW
324.0000 mg | CHEWABLE_TABLET | Freq: Every day | ORAL | Status: DC
  Administered 2016-03-11 – 2016-03-19 (×3): 324 mg
  Filled 2016-03-10 (×5): qty 4

## 2016-03-10 MED ORDER — LACTATED RINGERS IV SOLN
INTRAVENOUS | Status: DC | PRN
  Administered 2016-03-10 (×2): via INTRAVENOUS

## 2016-03-10 MED ORDER — MILRINONE IN DEXTROSE 20 MG/100ML IV SOLN
0.1250 ug/kg/min | INTRAVENOUS | Status: DC
  Administered 2016-03-11 – 2016-03-12 (×3): 0.25 ug/kg/min via INTRAVENOUS
  Filled 2016-03-10 (×3): qty 100

## 2016-03-10 MED ORDER — CHLORHEXIDINE GLUCONATE 0.12 % MT SOLN
OROMUCOSAL | Status: AC
Start: 2016-03-10 — End: 2016-03-10
  Administered 2016-03-10: 15 mL
  Filled 2016-03-10: qty 15

## 2016-03-10 MED ORDER — ONDANSETRON HCL 4 MG/2ML IJ SOLN
4.0000 mg | Freq: Four times a day (QID) | INTRAMUSCULAR | Status: DC | PRN
  Administered 2016-03-11 – 2016-03-22 (×5): 4 mg via INTRAVENOUS
  Filled 2016-03-10 (×5): qty 2

## 2016-03-10 MED ORDER — VANCOMYCIN HCL 1000 MG IV SOLR
1000.0000 mg | INTRAVENOUS | Status: DC | PRN
  Administered 2016-03-10: 1250 mg via INTRAVENOUS

## 2016-03-10 MED ORDER — SODIUM CHLORIDE 0.45 % IV SOLN
INTRAVENOUS | Status: DC | PRN
  Administered 2016-03-10: 18:00:00 via INTRAVENOUS

## 2016-03-10 MED ORDER — DOPAMINE-DEXTROSE 3.2-5 MG/ML-% IV SOLN
INTRAVENOUS | Status: DC | PRN
  Administered 2016-03-10: 4 ug/kg/min via INTRAVENOUS

## 2016-03-10 MED ORDER — PANTOPRAZOLE SODIUM 40 MG PO TBEC
40.0000 mg | DELAYED_RELEASE_TABLET | Freq: Every day | ORAL | Status: DC
  Administered 2016-03-12 – 2016-03-24 (×11): 40 mg via ORAL
  Filled 2016-03-10 (×13): qty 1

## 2016-03-10 MED ORDER — POTASSIUM CHLORIDE 10 MEQ/50ML IV SOLN
10.0000 meq | INTRAVENOUS | Status: AC
  Administered 2016-03-10 (×3): 10 meq via INTRAVENOUS

## 2016-03-10 MED ORDER — LIDOCAINE HCL (CARDIAC) 20 MG/ML IV SOLN
INTRAVENOUS | Status: DC | PRN
  Administered 2016-03-10: 60 mg via INTRAVENOUS

## 2016-03-10 MED ORDER — ACETAMINOPHEN 650 MG RE SUPP
650.0000 mg | Freq: Once | RECTAL | Status: AC
  Administered 2016-03-10: 650 mg via RECTAL

## 2016-03-10 MED ORDER — HEPARIN SODIUM (PORCINE) 1000 UNIT/ML IJ SOLN
INTRAMUSCULAR | Status: DC | PRN
  Administered 2016-03-10: 3500 [IU] via INTRAVENOUS
  Administered 2016-03-10 (×2): 7000 [IU] via INTRAVENOUS
  Administered 2016-03-10: 15000 [IU] via INTRAVENOUS

## 2016-03-10 MED ORDER — ASPIRIN EC 325 MG PO TBEC
325.0000 mg | DELAYED_RELEASE_TABLET | Freq: Every day | ORAL | Status: DC
  Administered 2016-03-12 – 2016-03-18 (×6): 325 mg via ORAL
  Filled 2016-03-10 (×8): qty 1

## 2016-03-10 MED ORDER — LACTATED RINGERS IV SOLN
INTRAVENOUS | Status: DC | PRN
  Administered 2016-03-10 (×2): via INTRAVENOUS

## 2016-03-10 MED ORDER — MIDAZOLAM HCL 5 MG/5ML IJ SOLN
INTRAMUSCULAR | Status: DC | PRN
  Administered 2016-03-10: 2 mg via INTRAVENOUS
  Administered 2016-03-10 (×2): 1 mg via INTRAVENOUS
  Administered 2016-03-10: 2.5 mg via INTRAVENOUS
  Administered 2016-03-10: 5 mg via INTRAVENOUS
  Administered 2016-03-10: .5 mg via INTRAVENOUS

## 2016-03-10 MED ORDER — MAGNESIUM SULFATE 4 GM/100ML IV SOLN
4.0000 g | Freq: Once | INTRAVENOUS | Status: AC
  Administered 2016-03-10: 4 g via INTRAVENOUS
  Filled 2016-03-10: qty 100

## 2016-03-10 MED ORDER — INSULIN REGULAR BOLUS VIA INFUSION
0.0000 [IU] | Freq: Three times a day (TID) | INTRAVENOUS | Status: DC
  Filled 2016-03-10: qty 10

## 2016-03-10 MED ORDER — DEXTROSE 5 % IV SOLN
0.0000 ug/min | INTRAVENOUS | Status: DC
  Filled 2016-03-10: qty 2

## 2016-03-10 MED ORDER — PHENYLEPHRINE HCL 10 MG/ML IJ SOLN
20.0000 mg | INTRAVENOUS | Status: DC | PRN
  Administered 2016-03-10: 50 ug/min via INTRAVENOUS

## 2016-03-10 MED ORDER — CHLORHEXIDINE GLUCONATE 0.12 % MT SOLN
15.0000 mL | OROMUCOSAL | Status: AC
  Administered 2016-03-10: 15 mL via OROMUCOSAL
  Filled 2016-03-10: qty 15

## 2016-03-10 MED ORDER — CEFUROXIME SODIUM 1.5 G IJ SOLR
1.5000 g | Freq: Two times a day (BID) | INTRAMUSCULAR | Status: AC
  Administered 2016-03-10 – 2016-03-12 (×4): 1.5 g via INTRAVENOUS
  Filled 2016-03-10 (×4): qty 1.5

## 2016-03-10 MED ORDER — NOREPINEPHRINE BITARTRATE 1 MG/ML IV SOLN
0.0000 ug/min | Freq: Once | INTRAVENOUS | Status: AC
  Administered 2016-03-10: 3 ug/min via INTRAVENOUS
  Filled 2016-03-10: qty 4

## 2016-03-10 MED ORDER — OXYCODONE HCL 5 MG PO TABS
5.0000 mg | ORAL_TABLET | ORAL | Status: DC | PRN
  Administered 2016-03-21: 10 mg via ORAL
  Filled 2016-03-10: qty 2

## 2016-03-10 MED ORDER — LEVALBUTEROL HCL 1.25 MG/0.5ML IN NEBU
1.2500 mg | INHALATION_SOLUTION | Freq: Four times a day (QID) | RESPIRATORY_TRACT | Status: DC
  Administered 2016-03-11 (×4): 1.25 mg via RESPIRATORY_TRACT
  Filled 2016-03-10 (×4): qty 0.5

## 2016-03-10 MED ORDER — HEPARIN SODIUM (PORCINE) 1000 UNIT/ML IJ SOLN
INTRAMUSCULAR | Status: AC
  Filled 2016-03-10: qty 1

## 2016-03-10 MED ORDER — SODIUM CHLORIDE 0.9 % IV SOLN
INTRAVENOUS | Status: DC
  Administered 2016-03-10: 18:00:00 via INTRAVENOUS

## 2016-03-10 MED ORDER — HEPARIN SODIUM (PORCINE) 1000 UNIT/ML IJ SOLN
INTRAMUSCULAR | Status: AC
  Filled 2016-03-10: qty 2

## 2016-03-10 MED ORDER — ARTIFICIAL TEARS OP OINT
TOPICAL_OINTMENT | OPHTHALMIC | Status: AC
  Filled 2016-03-10: qty 3.5

## 2016-03-10 MED ORDER — DEXMEDETOMIDINE HCL IN NACL 400 MCG/100ML IV SOLN
INTRAVENOUS | Status: DC | PRN
  Administered 2016-03-10: .3 ug/kg/h via INTRAVENOUS

## 2016-03-10 MED ORDER — DEXTROSE 5 % IV SOLN
1.5000 g | INTRAVENOUS | Status: DC | PRN
  Administered 2016-03-10: 1.5 g via INTRAVENOUS
  Administered 2016-03-10: 750 g via INTRAVENOUS

## 2016-03-10 MED ORDER — PLASMA-LYTE 148 IV SOLN
INTRAVENOUS | Status: DC | PRN
  Administered 2016-03-10: 500 mL via INTRAVASCULAR

## 2016-03-10 MED ORDER — HEPARIN SODIUM (PORCINE) 5000 UNIT/ML IJ SOLN
INTRAMUSCULAR | Status: DC | PRN
  Administered 2016-03-10: 200 mL

## 2016-03-10 MED ORDER — METOPROLOL TARTRATE 12.5 MG HALF TABLET: 12.5000 mg | ORAL_TABLET | Freq: Two times a day (BID) | ORAL | Status: DC

## 2016-03-10 MED ORDER — FENTANYL CITRATE (PF) 250 MCG/5ML IJ SOLN
INTRAMUSCULAR | Status: AC
  Filled 2016-03-10: qty 25

## 2016-03-10 MED ORDER — PHENYLEPHRINE 40 MCG/ML (10ML) SYRINGE FOR IV PUSH (FOR BLOOD PRESSURE SUPPORT)
PREFILLED_SYRINGE | INTRAVENOUS | Status: AC
  Filled 2016-03-10: qty 10

## 2016-03-10 MED ORDER — MORPHINE SULFATE (PF) 2 MG/ML IV SOLN: 1.0000 mg | INTRAVENOUS | Status: AC | PRN

## 2016-03-10 MED ORDER — FUROSEMIDE 10 MG/ML IJ SOLN
INTRAMUSCULAR | Status: AC
  Filled 2016-03-10: qty 2

## 2016-03-10 MED ORDER — LACTATED RINGERS IV SOLN
INTRAVENOUS | Status: DC | PRN
  Administered 2016-03-10 (×2): via INTRAVENOUS

## 2016-03-10 MED ORDER — INSULIN REGULAR HUMAN 100 UNIT/ML IJ SOLN
250.0000 [IU] | INTRAMUSCULAR | Status: DC | PRN
  Administered 2016-03-10: 1.6 [IU]/h via INTRAVENOUS

## 2016-03-10 MED ORDER — AMIODARONE HCL IN DEXTROSE 360-4.14 MG/200ML-% IV SOLN
60.0000 mg/h | INTRAVENOUS | Status: AC
  Administered 2016-03-10: 30 mg/h via INTRAVENOUS
  Filled 2016-03-10: qty 200

## 2016-03-10 MED ORDER — FAMOTIDINE IN NACL 20-0.9 MG/50ML-% IV SOLN
20.0000 mg | Freq: Two times a day (BID) | INTRAVENOUS | Status: AC
  Administered 2016-03-10 – 2016-03-11 (×2): 20 mg via INTRAVENOUS
  Filled 2016-03-10: qty 50

## 2016-03-10 MED ORDER — 0.9 % SODIUM CHLORIDE (POUR BTL) OPTIME
TOPICAL | Status: DC | PRN
  Administered 2016-03-10: 6000 mL

## 2016-03-10 MED ORDER — HEMOSTATIC AGENTS (NO CHARGE) OPTIME
TOPICAL | Status: DC | PRN
  Administered 2016-03-10: 3 via TOPICAL

## 2016-03-10 MED ORDER — INSULIN ASPART 100 UNIT/ML ~~LOC~~ SOLN
0.0000 [IU] | SUBCUTANEOUS | Status: DC
  Administered 2016-03-11: 2 [IU] via SUBCUTANEOUS

## 2016-03-10 SURGICAL SUPPLY — 123 items
ADAPTER CARDIO PERF ANTE/RETRO (ADAPTER) ×5 IMPLANT
ATRICLIP EXCLUSION 40 STD HAND (Clip) ×5 IMPLANT
BAG DECANTER FOR FLEXI CONT (MISCELLANEOUS) ×5 IMPLANT
BANDAGE ELASTIC 4 VELCRO ST LF (GAUZE/BANDAGES/DRESSINGS) ×10 IMPLANT
BANDAGE ELASTIC 6 VELCRO ST LF (GAUZE/BANDAGES/DRESSINGS) ×10 IMPLANT
BASKET HEART  (ORDER IN 25'S) (MISCELLANEOUS) ×1
BASKET HEART (ORDER IN 25'S) (MISCELLANEOUS) ×1
BASKET HEART (ORDER IN 25S) (MISCELLANEOUS) ×3 IMPLANT
BLADE STERNUM SYSTEM 6 (BLADE) ×5 IMPLANT
BLADE SURG 12 STRL SS (BLADE) ×5 IMPLANT
BLADE SURG ROTATE 9660 (MISCELLANEOUS) IMPLANT
BNDG GAUZE ELAST 4 BULKY (GAUZE/BANDAGES/DRESSINGS) ×10 IMPLANT
CANISTER SUCTION 2500CC (MISCELLANEOUS) ×10 IMPLANT
CANNULA GUNDRY RCSP 15FR (MISCELLANEOUS) ×5 IMPLANT
CANNULA VESSEL 3MM 2 BLNT TIP (CANNULA) ×5 IMPLANT
CATH CPB KIT VANTRIGT (MISCELLANEOUS) ×5 IMPLANT
CATH ROBINSON RED A/P 18FR (CATHETERS) ×20 IMPLANT
CATH THORACIC 36FR RT ANG (CATHETERS) ×10 IMPLANT
CLIP FOGARTY SPRING 6M (CLIP) ×5 IMPLANT
CLIP TI MEDIUM 6 (CLIP) ×5 IMPLANT
CLIP TI WIDE RED SMALL 24 (CLIP) ×5 IMPLANT
CLIP TI WIDE RED SMALL 6 (CLIP) ×5 IMPLANT
COVER SURGICAL LIGHT HANDLE (MISCELLANEOUS) ×5 IMPLANT
CRADLE DONUT ADULT HEAD (MISCELLANEOUS) ×10 IMPLANT
DECANTER SPIKE VIAL GLASS SM (MISCELLANEOUS) IMPLANT
DRAIN CHANNEL 15F RND FF W/TCR (WOUND CARE) ×5 IMPLANT
DRAIN CHANNEL 32F RND 10.7 FF (WOUND CARE) ×5 IMPLANT
DRAIN HEMOVAC 1/8 X 5 (WOUND CARE) IMPLANT
DRAPE CARDIOVASCULAR INCISE (DRAPES) ×2
DRAPE SLUSH/WARMER DISC (DRAPES) ×5 IMPLANT
DRAPE SRG 135X102X78XABS (DRAPES) ×3 IMPLANT
DRSG AQUACEL AG ADV 3.5X10 (GAUZE/BANDAGES/DRESSINGS) ×5 IMPLANT
DRSG AQUACEL AG ADV 3.5X14 (GAUZE/BANDAGES/DRESSINGS) ×5 IMPLANT
ELECT BLADE 4.0 EZ CLEAN MEGAD (MISCELLANEOUS) ×5
ELECT BLADE 6.5 EXT (BLADE) ×5 IMPLANT
ELECT CAUTERY BLADE 6.4 (BLADE) ×5 IMPLANT
ELECT REM PT RETURN 9FT ADLT (ELECTROSURGICAL) ×15
ELECTRODE BLDE 4.0 EZ CLN MEGD (MISCELLANEOUS) ×3 IMPLANT
ELECTRODE REM PT RTRN 9FT ADLT (ELECTROSURGICAL) ×9 IMPLANT
EVACUATOR SILICONE 100CC (DRAIN) ×5 IMPLANT
FELT TEFLON 1X6 (MISCELLANEOUS) ×5 IMPLANT
GAUZE SPONGE 4X4 12PLY STRL (GAUZE/BANDAGES/DRESSINGS) ×15 IMPLANT
GAUZE SPONGE 4X4 16PLY XRAY LF (GAUZE/BANDAGES/DRESSINGS) ×10 IMPLANT
GEL ULTRASOUND 20GR AQUASONIC (MISCELLANEOUS) IMPLANT
GLOVE BIO SURGEON STRL SZ7.5 (GLOVE) ×20 IMPLANT
GOWN STRL REUS W/ TWL LRG LVL3 (GOWN DISPOSABLE) ×21 IMPLANT
GOWN STRL REUS W/TWL LRG LVL3 (GOWN DISPOSABLE) ×14
HEMOSTAT POWDER SURGIFOAM 1G (HEMOSTASIS) ×15 IMPLANT
HEMOSTAT SURGICEL 2X14 (HEMOSTASIS) ×5 IMPLANT
INSERT FOGARTY XLG (MISCELLANEOUS) IMPLANT
KIT BASIN OR (CUSTOM PROCEDURE TRAY) ×10 IMPLANT
KIT ROOM TURNOVER OR (KITS) ×10 IMPLANT
KIT SUCTION CATH 14FR (SUCTIONS) ×10 IMPLANT
KIT VASOVIEW 6 PRO VH 2400 (KITS) ×5 IMPLANT
LEAD PACING MYOCARDI (MISCELLANEOUS) ×5 IMPLANT
LIQUID BAND (GAUZE/BANDAGES/DRESSINGS) ×5 IMPLANT
LOOP VESSEL MINI RED (MISCELLANEOUS) IMPLANT
MARKER GRAFT CORONARY BYPASS (MISCELLANEOUS) ×15 IMPLANT
NEEDLE HYPO 25GX1X1/2 BEV (NEEDLE) IMPLANT
NS IRRIG 1000ML POUR BTL (IV SOLUTION) ×35 IMPLANT
PACK CAROTID (CUSTOM PROCEDURE TRAY) ×5 IMPLANT
PACK OPEN HEART (CUSTOM PROCEDURE TRAY) ×5 IMPLANT
PAD ARMBOARD 7.5X6 YLW CONV (MISCELLANEOUS) ×20 IMPLANT
PAD ELECT DEFIB RADIOL ZOLL (MISCELLANEOUS) ×5 IMPLANT
PATCH HEMASHIELD 8X150 (Vascular Products) ×5 IMPLANT
PENCIL BUTTON HOLSTER BLD 10FT (ELECTRODE) ×5 IMPLANT
PUNCH AORTIC ROT 4.0MM RCL 40 (MISCELLANEOUS) ×5 IMPLANT
PUNCH AORTIC ROTATE 4.0MM (MISCELLANEOUS) IMPLANT
PUNCH AORTIC ROTATE 4.5MM 8IN (MISCELLANEOUS) IMPLANT
PUNCH AORTIC ROTATE 5MM 8IN (MISCELLANEOUS) IMPLANT
SET CARDIOPLEGIA MPS 5001102 (MISCELLANEOUS) ×5 IMPLANT
SHUNT CAROTID BYPASS 10 (VASCULAR PRODUCTS) IMPLANT
SHUNT CAROTID BYPASS 12FRX15.5 (VASCULAR PRODUCTS) IMPLANT
SPONGE INTESTINAL PEANUT (DISPOSABLE) ×5 IMPLANT
SPONGE LAP 18X18 X RAY DECT (DISPOSABLE) ×10 IMPLANT
SPONGE LAP 4X18 X RAY DECT (DISPOSABLE) ×5 IMPLANT
SPONGE SURGIFOAM ABS GEL 100 (HEMOSTASIS) IMPLANT
SURGIFLO W/THROMBIN 8M KIT (HEMOSTASIS) ×10 IMPLANT
SUT BONE WAX W31G (SUTURE) ×5 IMPLANT
SUT ETHILON 3 0 PS 1 (SUTURE) IMPLANT
SUT MNCRL AB 4-0 PS2 18 (SUTURE) IMPLANT
SUT PROLENE 3 0 SH DA (SUTURE) IMPLANT
SUT PROLENE 3 0 SH1 36 (SUTURE) IMPLANT
SUT PROLENE 4 0 RB 1 (SUTURE) ×2
SUT PROLENE 4 0 SH DA (SUTURE) ×20 IMPLANT
SUT PROLENE 4-0 RB1 .5 CRCL 36 (SUTURE) ×3 IMPLANT
SUT PROLENE 5 0 C 1 36 (SUTURE) ×5 IMPLANT
SUT PROLENE 6 0 C 1 30 (SUTURE) IMPLANT
SUT PROLENE 6 0 CC (SUTURE) ×20 IMPLANT
SUT PROLENE 7 0 BV 1 (SUTURE) ×5 IMPLANT
SUT PROLENE 7 0 BV1 MDA (SUTURE) ×15 IMPLANT
SUT PROLENE 8 0 BV175 6 (SUTURE) IMPLANT
SUT PROLENE BLUE 7 0 (SUTURE) ×15 IMPLANT
SUT SILK  1 MH (SUTURE) ×2
SUT SILK 1 MH (SUTURE) ×3 IMPLANT
SUT SILK 2 0 SH CR/8 (SUTURE) ×5 IMPLANT
SUT SILK 3 0 SH CR/8 (SUTURE) ×5 IMPLANT
SUT SILK 3 0 TIES 17X18 (SUTURE)
SUT SILK 3-0 18XBRD TIE BLK (SUTURE) IMPLANT
SUT STEEL 6MS V (SUTURE) ×15 IMPLANT
SUT STEEL SZ 6 DBL 3X14 BALL (SUTURE) ×5 IMPLANT
SUT VIC AB 1 CTX 18 (SUTURE) ×10 IMPLANT
SUT VIC AB 1 CTX 36 (SUTURE) ×6
SUT VIC AB 1 CTX36XBRD ANBCTR (SUTURE) ×9 IMPLANT
SUT VIC AB 2-0 CT1 27 (SUTURE) ×4
SUT VIC AB 2-0 CT1 TAPERPNT 27 (SUTURE) ×6 IMPLANT
SUT VIC AB 2-0 CTX 27 (SUTURE) IMPLANT
SUT VIC AB 2-0 CTX 36 (SUTURE) ×5 IMPLANT
SUT VIC AB 3-0 SH 27 (SUTURE) ×2
SUT VIC AB 3-0 SH 27X BRD (SUTURE) ×3 IMPLANT
SUT VIC AB 3-0 SH 8-18 (SUTURE) ×5 IMPLANT
SUT VIC AB 3-0 X1 27 (SUTURE) ×5 IMPLANT
SUT VIC AB 4-0 PS2 27 (SUTURE) ×5 IMPLANT
SUT VICRYL 4-0 PS2 18IN ABS (SUTURE) ×5 IMPLANT
SUTURE E-PAK OPEN HEART (SUTURE) ×5 IMPLANT
SYR CONTROL 10ML LL (SYRINGE) IMPLANT
SYSTEM SAHARA CHEST DRAIN ATS (WOUND CARE) ×5 IMPLANT
TOWEL OR 17X24 6PK STRL BLUE (TOWEL DISPOSABLE) ×10 IMPLANT
TOWEL OR 17X26 10 PK STRL BLUE (TOWEL DISPOSABLE) ×10 IMPLANT
TRAY FOLEY IC TEMP SENS 16FR (CATHETERS) ×5 IMPLANT
TUBING INSUFFLATION (TUBING) ×5 IMPLANT
UNDERPAD 30X30 INCONTINENT (UNDERPADS AND DIAPERS) ×5 IMPLANT
WATER STERILE IRR 1000ML POUR (IV SOLUTION) ×15 IMPLANT

## 2016-03-10 NOTE — H&P (View-Only) (Signed)
[facility]  Subjective  - feels ok   Objective 150/87 82 98 F (36.7 C) (Oral) 19 98%  Intake/Output Summary (Last 24 hours) at 03/07/16 [id number] Last data filed at 03/07/16 [id number]  Gross per 24 hour  Intake    360 ml  Output    450 ml  Net    -90 ml   Neuro: symmetric UE/LE motor  Assessment/Planning: Left CEA combined with CABG next week Questions answered.  Risks benefits procedure discussed.  Fabienne BrunsFields, [name] [name] 03/07/2016 9:03 AM --  Laboratory Lab Results:  Recent Labs  03/06/16 0545 03/07/16 0330  WBC 9.4 7.1  HGB 13.5 13.4  HCT 40.3 39.2  PLT 180 178   BMET  Recent Labs  03/05/16 0540 03/07/16 0330  NA 142 142  K 3.9 3.9  CL 112* 112*  CO2 22 22  GLUCOSE 105* 108*  BUN 19 29*  CREATININE 1.25* 1.31*  CALCIUM 9.8 10.1    COAG Lab Results  Component Value Date   INR 1.09 03/04/2016   No results found for: PTT

## 2016-03-10 NOTE — Progress Notes (Signed)
Follow up ABG drawn. PH of 7.29. Full result read to [facility] Facility[facility]art[facility] MD. Orders received adjust RR to 14. RT aware. Will continue to monitor closely. Modena JanskyKevin Isauro Skelley RN 2 [facility]

## 2016-03-10 NOTE — Progress Notes (Signed)
  Echocardiogram Echocardiogram Transesophageal has been performed.  Janalyn HarderWest, Wadsworth Skolnick R 03/10/2016, 2:37 PM

## 2016-03-10 NOTE — Anesthesia Procedure Notes (Addendum)
Procedure Name: Intubation Date/Time: 03/10/2016 8:12 AM Performed by: Faustino CongressWHITE, KELSEY TENA WEAVER Pre-anesthesia Checklist: Patient identified, Emergency Drugs available, Suction available and Patient being monitored Patient Re-evaluated:Patient Re-evaluated prior to inductionOxygen Delivery Method: Circle system utilized Preoxygenation: Pre-oxygenation with 100% oxygen Intubation Type: IV induction Ventilation: Mask ventilation without difficulty and Oral airway inserted - appropriate to patient size Laryngoscope Size: Mac and 4 Grade View: Grade II Tube type: Oral Tube size: 8.5 mm Number of attempts: 1 Airway Equipment and Method: Stylet Placement Confirmation: ETT inserted through vocal cords under direct vision,  positive ETCO2 and breath sounds checked- equal and bilateral Secured at: 23 cm Tube secured with: Tape Dental Injury: Teeth and Oropharynx as per pre-operative assessment     Procedures: Right IJ Theone MurdochSwan Ganz Catheter Insertion: [id number]: The patient was identified and consent obtained.  TO was performed, and full barrier precautions were used.  The skin was anesthetized with lidocaine-4cc plain with 25g needle.  Once the vein was located with the 22 ga. needle using ultrasound guidance , the wire was inserted into the vein.  The wire location was confirmed with ultrasound.  The tissue was dilated and the 8.5 [place] cordis catheter was carefully inserted. Afterwards Theone MurdochSwan Ganz catheter was inserted. PA catheter at 45cm.  The patient tolerated the procedure well.

## 2016-03-10 NOTE — Progress Notes (Signed)
The patient was examined and preop studies reviewed. There has been no change from the prior exam and the patient is ready for surgery.  Plan CABG on T Yeo for severe CAD

## 2016-03-10 NOTE — Brief Op Note (Signed)
03/02/2016 - 03/10/2016      [address]       [place] [id number]             [phone]     03/02/2016 - 03/10/2016  2:55 PM  PATIENT:  [name] [name]  75 y.o. male  PRE-OPERATIVE DIAGNOSIS:  CAD, LEFT CAROTID ARTERY STENOSIS  POST-OPERATIVE DIAGNOSIS:  CAD, LEFT CAROTID ARTERY STENOSIS  PROCEDURE:  Procedure(s): CORONARY ARTERY BYPASS GRAFTING (CABG)TIMES 4 USING LEFT INTERNAL MAMMARY AND BILATERAL GREATER SAPHENOUS VEIN HARVESTED BY ENDOVIEN. (LIMA-LAD; SVG-OM; SVG-PD; SVG-DIAG) TRANSESOPHAGEAL ECHOCARDIOGRAM (TEE) CLIPPING OF ATRIAL APPENDAGE LEFT ENDARTERECTOMY CAROTID DACRON PATCH ANGIOPLASTY  SURGEON:  Surgeon(s): Kerin PernaPeter Van Trigt, MD Sherren Kernsharles E Fields, MD  PHYSICIAN ASSISTANT: Symphany Fleissner PA-C; Denny PeonERIN BARRETT PA-C  ANESTHESIA:   general  PATIENT CONDITION:  ICU - intubated and hemodynamically stable.  PRE-OPERATIVE WEIGHT: 66kg  COMPLICATIONS: NO KNOWN

## 2016-03-10 NOTE — OR Nursing (Signed)
Forty-five minute call to SICU charge nurse at 1624.

## 2016-03-10 NOTE — Op Note (Signed)
Procedure Left carotid endarterectomy  Preoperative diagnosis: High-grade asymptomatic left internal carotid artery stenosis  Postoperative diagnosis: Same  Anesthesia General  As[facility] Trinh[facility]  Operative findings: #1 greater than 80% left internal carotid stenosis, severely diseased artery extending from the proximal common carotid 4 cm into the internal carotid                                                             #2 Dacron patch            Operative details: After obtaining informed consent, the patient was taken to the operating room. The patient was placed in a supine position on the operating room table. After induction of general anesthesia and endotracheal intubation a Foley catheter was placed. Next the patient's entire neck and chest was prepped and draped in the usual sterile fashion. An oblique incision was made on the left aspect of the patient's neck anterior to the border the left sternocleidomastoid muscle. The incision was carried into the subcutaneous tissues and through the platysma. The sternocleidomastoid muscle was identified and reflected laterally. The omohyoid muscle was identified and this was divided with cautery. The common carotid artery was then found at the base of the incision this was dissected free circumferentially. It was fairly soft on palpation but thickened.  The vagus nerve was identified and protected. Dissection was then carried up to the level carotid bifurcation.   The hyperglossal nerve was identified and protected.  It was evident at this point that the patient had significant carotid bifurcation disease but also had an additional plaque in the ICA that would need to be addressed.  This second area of narrowing was at the level of the hypoglossal with a posterior tongue of plaque extending even more distally. The internal carotid artery was dissected free circumferentially where it was soft in character at this location and above any palpable  disease. A vessel loop was placed around this. Next the external carotid and superior thyroid arteries were dissected free circumferentially and vessel loops were placed around these. The patient was given 7000 units of intravenous heparin and an additional bolus during the course of the case.  After 2 minutes of circulation time and raising the mean arterial pressure to 90 mm mercury, the distal internal carotid artery was controlled with a Kitzmiller clamp. The external carotid and superior thyroid arteries were controlled with vessel loops. The common carotid artery was controlled with a peripheral DeBakey clamp. A longitudinal opening was made in the common carotid artery just below the bifurcation. The arteriotomy was extended distally up into the internal carotid with Potts scissors. There was a large calcified plaque with greater than 80% stenosis at the bifurcation and an additional exophytic plaque several centimeters into the internal carotid.  There was brisk backbleeding from the internal carotid and I did not feel it was safe to pass a shunt due to the distal disease.    Attention was then turned to the common carotid artery once again. A suitable endarterectomy plane was obtained and endarterectomy was begun in the common carotid artery it was difficult to obtain a good proximal endpoint due to the extent of plaque in the common carotid.  I was finally able to obtain a reasonable proximal endpoint extending the arteriotomy to  the very proximal CCA. An eversion endarterectomy was performed on the external carotid artery and a good endpoint was obtained. The plaque was then elevated in the internal carotid artery and a nice feathered distal endpoint was also obtained but this was very difficult due to the plaque extension several centimeters into the ICA.  The plaque was passed off the table. All loose debris was then removed from the carotid bed and everything was thoroughly irrigated with heparinized  saline. A Dacron patch was then brought on to the operative field and this was sewn on as a patch angioplasty using a running 6-0 Prolene suture. This patch was about 10 cm in total length.  Prior to completion of the anastomosis the internal carotid artery was thoroughly backbled. This was then controlled again with a fine bulldog clamp.  The common carotid was thoroughly flushed forward. The external carotid was also thoroughly backbled.  The remainder of the patch was completed and the anastomosis was secured. Flow was then restored first retrograde from the external carotid into the carotid bed then antegrade from the common carotid to the external carotid artery and after approximately 5 cardiac cycles to the internal carotid artery. Doppler was used to evaluate the external/internal and common carotid arteries and these all had good Doppler flow. Hemostasis was obtained with several additional repair sutures along the inner wall of the patch in the distal ICA. The wound was packed with thrombin and gelfoam and a laparotomy pad.  At this point Dr Donata Clay entered the room to perform CABG.  This will be dictated separately.  At the conclusion of the case the thrombin and gelfoam were removed.  A JP was brought out through a separate stab incision.  The wound was thoroughly irrigated.  The wound was hemostatic.  The platysma was reapproximated with a running 3 0 vicryl.  The skin was closed with a 4 0 vicryl subcuticular stitch.  The patient was taken to the ICU in stable condition  Fabienne Bruns, MD [facility] [place] Office: [phone] Pager: [phone]

## 2016-03-10 NOTE — Interval H&P Note (Signed)
History and Physical Interval Note:  03/10/2016 7:27 AM  [name] [name]  has presented today for surgery, with the diagnosis of CAD, LEFT CAROTID ARTERY STENOSIS  The various methods of treatment have been discussed with the patient and family. After consideration of risks, benefits and other options for treatment, the patient has consented to  Procedure(s): CORONARY ARTERY BYPASS GRAFTING (CABG) (N/A) TRANSESOPHAGEAL ECHOCARDIOGRAM (TEE) (N/A) CLIPPING OF ATRIAL APPENDAGE (Left) LEFT ENDARTERECTOMY CAROTID (Left) as a surgical intervention .  The patient's history has been reviewed, patient examined, no change in status, stable for surgery.  I have reviewed the patient's chart and labs.  Questions were answered to the patient's satisfaction.     Fabienne BrunsFields, Charles

## 2016-03-10 NOTE — Anesthesia Preprocedure Evaluation (Addendum)
Anesthesia Evaluation  Patient identified by MRN, date of birth, ID band Patient awake    Airway Mallampati: II  TM Distance: >3 FB Neck ROM: Full    Dental   Pulmonary former smoker,    breath sounds clear to auscultation       Cardiovascular hypertension, + CAD, + Peripheral Vascular Disease and +CHF   Rhythm:Regular Rate:Normal     Neuro/Psych    GI/Hepatic negative GI ROS, Neg liver ROS,   Endo/Other    Renal/GU negative Renal ROS     Musculoskeletal   Abdominal   Peds  Hematology   Anesthesia Other Findings   Reproductive/Obstetrics                            Anesthesia Physical Anesthesia Plan  ASA: IV  Anesthesia Plan: General   Post-op Pain Management:    Induction: Intravenous  Airway Management Planned: Oral ETT  Additional Equipment: PA Cath, Arterial line and TEE  Intra-op Plan:   Post-operative Plan: Post-operative intubation/ventilation  Informed Consent: I have reviewed the patients History and Physical, chart, labs and discussed the procedure including the risks, benefits and alternatives for the proposed anesthesia with the patient or authorized representative who has indicated his/her understanding and acceptance.   Dental advisory given  Plan Discussed with: CRNA, Anesthesiologist and Surgeon  Anesthesia Plan Comments:        Anesthesia Quick Evaluation

## 2016-03-10 NOTE — OR Nursing (Signed)
Twenty minute call to SICU charge nurse at 1714.

## 2016-03-10 NOTE — Progress Notes (Signed)
Dr. Myra GianottiBrabham paged and notified of pupils at 7mm and non-reactive. No further orders at this time. Will continue to closely monitor. Modena JanskyKevin Rhylen Pulido RN [facility]

## 2016-03-10 NOTE — Significant Event (Addendum)
Did initial assessment post surgery after settling patient. Noted bilateral pupils are non-reactive, 7mm in size. Spoke with Dr. Donata ClayVan Trigt regarding these findings. No new orders at this time.

## 2016-03-10 NOTE — Transfer of Care (Signed)
Immediate Anesthesia Transfer of Care Note  Patient: Danella Deisibor Reede  Procedure(s) Performed: Procedure(s): CORONARY ARTERY BYPASS GRAFTING (CABG)TIMES 4 USING LEFT INTERNAL MAMMARY AND BILATERAL GREATER SAPHENOUS VEIN HARVESTED BY ENDOVIEN (N/A) TRANSESOPHAGEAL ECHOCARDIOGRAM (TEE) (N/A) CLIPPING OF ATRIAL APPENDAGE (Left) LEFT ENDARTERECTOMY CAROTID DACRON PATCH ANGIOPLASTY (Left)  Patient Location: ICU  Anesthesia Type:General  Level of Consciousness: Patient remains intubated per anesthesia plan  Airway & Oxygen Therapy: Patient remains intubated per anesthesia plan and Patient placed on Ventilator (see vital sign flow sheet for setting)  Post-op Assessment: Report given to RN and Post -op Vital signs reviewed and stable  Post vital signs: Reviewed and stable  Last Vitals:  Filed Vitals:   03/10/16 0403 03/10/16 1805  BP: 142/99   Pulse: 86 87  Temp: 36.7 C   Resp: 20 22    Complications: No apparent anesthesia complications

## 2016-03-11 ENCOUNTER — Encounter (HOSPITAL_COMMUNITY): Payer: Self-pay | Admitting: Cardiothoracic Surgery

## 2016-03-11 ENCOUNTER — Inpatient Hospital Stay (HOSPITAL_COMMUNITY): Payer: Commercial Managed Care - HMO

## 2016-03-11 LAB — BASIC METABOLIC PANEL WITH GFR
Anion gap: 6 (ref 5–15)
BUN: 27 mg/dL — ABNORMAL HIGH (ref 6–20)
CO2: 23 mmol/L (ref 22–32)
Calcium: 9.4 mg/dL (ref 8.9–10.3)
Chloride: 111 mmol/L (ref 101–111)
Creatinine, Ser: 1.71 mg/dL — ABNORMAL HIGH (ref 0.61–1.24)
GFR calc Af Amer: 43 mL/min — ABNORMAL LOW
GFR calc non Af Amer: 37 mL/min — ABNORMAL LOW
Glucose, Bld: 115 mg/dL — ABNORMAL HIGH (ref 65–99)
Potassium: 4.1 mmol/L (ref 3.5–5.1)
Sodium: 140 mmol/L (ref 135–145)

## 2016-03-11 LAB — POCT I-STAT 3, ART BLOOD GAS (G3+)
ACID-BASE DEFICIT: 2 mmol/L (ref 0.0–2.0)
Bicarbonate: 25.4 mEq/L — ABNORMAL HIGH (ref 20.0–24.0)
Bicarbonate: 21.8 mEq/L (ref 20.0–24.0)
Bicarbonate: 24.3 mEq/L — ABNORMAL HIGH (ref 20.0–24.0)
O2 SAT: 98 %
O2 SAT: 97 %
O2 Saturation: 96 %
PCO2 ART: 32.6 mmHg — AB (ref 35.0–45.0)
PCO2 ART: 39.3 mmHg (ref 35.0–45.0)
PH ART: 7.435 (ref 7.350–7.450)
PO2 ART: 82 mmHg (ref 80.0–100.0)
TCO2: 27 mmol/L (ref 0–100)
TCO2: 23 mmol/L (ref 0–100)
TCO2: 25 mmol/L (ref 0–100)
pCO2 arterial: 44.5 mmHg (ref 35.0–45.0)
pH, Arterial: 7.368 (ref 7.350–7.450)
pH, Arterial: 7.402 (ref 7.350–7.450)
pO2, Arterial: 122 mmHg — ABNORMAL HIGH (ref 80.0–100.0)
pO2, Arterial: 90 mmHg (ref 80.0–100.0)

## 2016-03-11 LAB — PREPARE FRESH FROZEN PLASMA
UNIT DIVISION: 0
Unit division: 0
Unit division: 0
Unit division: 0

## 2016-03-11 LAB — CBC
HCT: 26 % — ABNORMAL LOW (ref 39.0–52.0)
HCT: 24.9 % — ABNORMAL LOW (ref 39.0–52.0)
Hemoglobin: 8.9 g/dL — ABNORMAL LOW (ref 13.0–17.0)
Hemoglobin: 8.3 g/dL — ABNORMAL LOW (ref 13.0–17.0)
MCH: 30.4 pg (ref 26.0–34.0)
MCH: 29.7 pg (ref 26.0–34.0)
MCHC: 34.2 g/dL (ref 30.0–36.0)
MCHC: 33.3 g/dL (ref 30.0–36.0)
MCV: 88.7 fL (ref 78.0–100.0)
MCV: 89.2 fL (ref 78.0–100.0)
PLATELETS: 94 10*3/uL — AB (ref 150–400)
Platelets: 84 K/uL — ABNORMAL LOW (ref 150–400)
RBC: 2.93 MIL/uL — ABNORMAL LOW (ref 4.22–5.81)
RBC: 2.79 MIL/uL — AB (ref 4.22–5.81)
RDW: 17.2 % — ABNORMAL HIGH (ref 11.5–15.5)
RDW: 17.4 % — AB (ref 11.5–15.5)
WBC: 24.2 K/uL — ABNORMAL HIGH (ref 4.0–10.5)
WBC: 22.6 10*3/uL — AB (ref 4.0–10.5)

## 2016-03-11 LAB — POCT I-STAT, CHEM 8
BUN: 30 mg/dL — AB (ref 6–20)
CALCIUM ION: 1.39 mmol/L — AB (ref 1.13–1.30)
CHLORIDE: 105 mmol/L (ref 101–111)
CREATININE: 2.2 mg/dL — AB (ref 0.61–1.24)
GLUCOSE: 170 mg/dL — AB (ref 65–99)
HCT: 25 % — ABNORMAL LOW (ref 39.0–52.0)
Hemoglobin: 8.5 g/dL — ABNORMAL LOW (ref 13.0–17.0)
Potassium: 4.5 mmol/L (ref 3.5–5.1)
SODIUM: 140 mmol/L (ref 135–145)
TCO2: 22 mmol/L (ref 0–100)

## 2016-03-11 LAB — CREATININE, SERUM
CREATININE: 2.29 mg/dL — AB (ref 0.61–1.24)
GFR, EST AFRICAN AMERICAN: 30 mL/min — AB (ref >60)
GFR, EST NON AFRICAN AMERICAN: 26 mL/min — AB (ref >60)

## 2016-03-11 LAB — GLUCOSE, CAPILLARY
GLUCOSE-CAPILLARY: 106 mg/dL — AB (ref 65–99)
GLUCOSE-CAPILLARY: 112 mg/dL — AB (ref 65–99)
GLUCOSE-CAPILLARY: 125 mg/dL — AB (ref 65–99)
GLUCOSE-CAPILLARY: 153 mg/dL — AB (ref 65–99)

## 2016-03-11 LAB — MAGNESIUM
MAGNESIUM: 2.4 mg/dL (ref 1.7–2.4)
Magnesium: 2.6 mg/dL — ABNORMAL HIGH (ref 1.7–2.4)

## 2016-03-11 LAB — PREPARE PLATELET PHERESIS: UNIT DIVISION: 0

## 2016-03-11 MED ORDER — FUROSEMIDE 10 MG/ML IJ SOLN
40.0000 mg | Freq: Two times a day (BID) | INTRAMUSCULAR | Status: DC
  Administered 2016-03-11 (×2): 40 mg via INTRAVENOUS
  Filled 2016-03-11 (×3): qty 4

## 2016-03-11 MED ORDER — CALCIUM CHLORIDE 10 % IV SOLN
1.0000 g | Freq: Once | INTRAVENOUS | Status: AC
  Administered 2016-03-11: 1 g via INTRAVENOUS

## 2016-03-11 MED ORDER — CHLORHEXIDINE GLUCONATE 0.12% ORAL RINSE (MEDLINE KIT)
15.0000 mL | Freq: Two times a day (BID) | OROMUCOSAL | Status: DC
  Administered 2016-03-11: 15 mL via OROMUCOSAL

## 2016-03-11 MED ORDER — INSULIN ASPART 100 UNIT/ML ~~LOC~~ SOLN
0.0000 [IU] | SUBCUTANEOUS | Status: DC
  Administered 2016-03-11 – 2016-03-12 (×3): 2 [IU] via SUBCUTANEOUS
  Administered 2016-03-12: 8 [IU] via SUBCUTANEOUS
  Administered 2016-03-12 – 2016-03-14 (×4): 2 [IU] via SUBCUTANEOUS
  Administered 2016-03-14: 4 [IU] via SUBCUTANEOUS

## 2016-03-11 MED ORDER — ANTISEPTIC ORAL RINSE SOLUTION (CORINZ)
7.0000 mL | OROMUCOSAL | Status: DC
  Administered 2016-03-11 (×4): 7 mL via OROMUCOSAL

## 2016-03-11 MED ORDER — CETYLPYRIDINIUM CHLORIDE 0.05 % MT LIQD
7.0000 mL | Freq: Two times a day (BID) | OROMUCOSAL | Status: DC
  Administered 2016-03-11 – 2016-03-24 (×24): 7 mL via OROMUCOSAL

## 2016-03-11 MED ORDER — CALCIUM CHLORIDE 10 % IV SOLN
INTRAVENOUS | Status: AC
  Administered 2016-03-11: 1 g via INTRAVENOUS
  Filled 2016-03-11: qty 10

## 2016-03-11 MED ORDER — FUROSEMIDE 10 MG/ML IJ SOLN
40.0000 mg | Freq: Once | INTRAMUSCULAR | Status: AC
  Administered 2016-03-11: 40 mg via INTRAVENOUS
  Filled 2016-03-11: qty 4

## 2016-03-11 MED ORDER — LEVALBUTEROL HCL 1.25 MG/0.5ML IN NEBU
1.2500 mg | INHALATION_SOLUTION | Freq: Three times a day (TID) | RESPIRATORY_TRACT | Status: DC
  Administered 2016-03-12 – 2016-03-15 (×10): 1.25 mg via RESPIRATORY_TRACT
  Filled 2016-03-11 (×10): qty 0.5

## 2016-03-11 MED ORDER — ALBUMIN HUMAN 5 % IV SOLN
12.5000 g | Freq: Once | INTRAVENOUS | Status: AC
  Administered 2016-03-11: 12.5 g via INTRAVENOUS
  Filled 2016-03-11: qty 250

## 2016-03-11 MED FILL — Calcium Chloride Inj 10%: INTRAVENOUS | Qty: 10 | Status: AC

## 2016-03-11 MED FILL — Heparin Sodium (Porcine) Inj 1000 Unit/ML: INTRAMUSCULAR | Qty: 30 | Status: AC

## 2016-03-11 MED FILL — Mannitol IV Soln 20%: INTRAVENOUS | Qty: 500 | Status: AC

## 2016-03-11 MED FILL — Magnesium Sulfate Inj 50%: INTRAMUSCULAR | Qty: 10 | Status: AC

## 2016-03-11 MED FILL — Albumin, Human Inj 5%: INTRAVENOUS | Qty: 250 | Status: AC

## 2016-03-11 MED FILL — Lidocaine HCl IV Inj 20 MG/ML: INTRAVENOUS | Qty: 5 | Status: AC

## 2016-03-11 MED FILL — Sodium Chloride IV Soln 0.9%: INTRAVENOUS | Qty: 4000 | Status: AC

## 2016-03-11 MED FILL — Sodium Bicarbonate IV Soln 8.4%: INTRAVENOUS | Qty: 50 | Status: AC

## 2016-03-11 MED FILL — Potassium Chloride Inj 2 mEq/ML: INTRAVENOUS | Qty: 40 | Status: AC

## 2016-03-11 MED FILL — Electrolyte-R (PH 7.4) Solution: INTRAVENOUS | Qty: 5000 | Status: AC

## 2016-03-11 MED FILL — Heparin Sodium (Porcine) Inj 1000 Unit/ML: INTRAMUSCULAR | Qty: 10 | Status: AC

## 2016-03-11 NOTE — Procedures (Signed)
Extubation Procedure Note  Patient Details:   Name: [name] [name] DOB: Mar 02, 1941 MRN: [id number]   Airway Documentation:     Evaluation  O2 sats: stable throughout Complications: No apparent complications Patient did tolerate procedure well. Bilateral Breath Sounds: Clear, Diminished   Yes  Placed on 4l [place] Incentive Spirometer instructed 650ml  Newt LukesGroendal, Shavona Gunderman Ann 03/11/2016, 11:33 AM

## 2016-03-11 NOTE — Progress Notes (Signed)
CT surgery p.m. Rounds  Patient successfully extubated today Neuro intact Drain removed from left neck Dangled onside of bed A-V pacing with cardiac index greater than 2 P.m. labs reviewed and are satisfactory Lasix 40 mg IV ordered

## 2016-03-11 NOTE — Progress Notes (Signed)
Initial Nutrition Assessment  DOCUMENTATION CODES:   Not applicable  INTERVENTION:     If TF started, recommend Vital AF 1.2 formula at goal rate of 50 ml/hr with Prostat liquid protein 30 ml daily   Above TF regimen to provide 1540 total kcals, 105 gm protein, 973 ml of free water  NUTRITION DIAGNOSIS:   Inadequate oral intake related to inability to eat as evidenced by NPO status  GOAL:   Patient will meet greater than or equal to 90% of their needs  MONITOR:   Vent status, Labs, Weight trends, Skin, I & O's  ASSESSMENT:   Pt is 75 yo male, functional at baseline, presented from [place] office as his BP was > 220/120. Pt reports he has not seen doctor in years and currently reports only occasional exertional dyspnea and some weight gain but is not sure how many pounds. He denies fevers, chills, chest pain, no abd or urinary concerns, take no BP medications.    Patient s/p procedure 3/28: CORONARY ARTERY BYPASS GRAFTING (CABG) x 4  Patient is currently intubated on ventilator support MV: 7.2 L/min Temp (24hrs), Avg:98.8 F (37.1 C), Min:95.2 F (35.1 C), Max:100 F (37.8 C)   Prior to surgery, pt was on a Heart Healthy diet; PO intake 75-100%.  Diet Order:  Diet NPO time specified  Skin:  Reviewed, no issues  Last BM:  3/27  Height:   Ht Readings from Last 1 Encounters:  03/10/16 5\' 2"  (1.575 m)    Weight:   Wt Readings from Last 1 Encounters:  03/11/16 160 lb 11.5 oz (72.9 kg)    Ideal Body Weight:  53.6 kg  BMI:  Body mass index is 29.39 kg/(m^2).  Estimated Nutritional Needs:   Kcal:  1606  Protein:  110-120 gm  Fluid:  per MD  EDUCATION NEEDS:   No education needs identified at this time  Maureen ChattersKatie Brannon Levene, RD, LDN Pager #: [phone] After-Hours Pager #: [phone]

## 2016-03-11 NOTE — Anesthesia Postprocedure Evaluation (Addendum)
Anesthesia Post Note  Patient: [name] [name]  Procedure(s) Performed: Procedure(s) (LRB): CORONARY ARTERY BYPASS GRAFTING (CABG)TIMES 4 USING LEFT INTERNAL MAMMARY AND BILATERAL GREATER SAPHENOUS VEIN HARVESTED BY ENDOVIEN (N/A) TRANSESOPHAGEAL ECHOCARDIOGRAM (TEE) (N/A) CLIPPING OF ATRIAL APPENDAGE (Left) LEFT ENDARTERECTOMY CAROTID DACRON PATCH ANGIOPLASTY (Left)  Patient location during evaluation: SICU Anesthesia Type: General Level of consciousness: patient remains intubated per anesthesia plan Pain management: pain level controlled Vital Signs Assessment: post-procedure vital signs reviewed and stable Respiratory status: patient remains intubated per anesthesia plan Cardiovascular status: stable Anesthetic complications: no    Last Vitals:  Filed Vitals:   03/11/16 1700 03/11/16 1730  BP: 96/70   Pulse: 90 86  Temp: 37.1 C 37.1 C  Resp: 18 21    Last Pain:  Filed Vitals:   03/11/16 1740  PainSc: 0-No pain                 EDWARDS,Rutger Salton

## 2016-03-11 NOTE — Progress Notes (Signed)
1 Day Post-Op Procedure(s) (LRB): CORONARY ARTERY BYPASS GRAFTING (CABG)TIMES 4 USING LEFT INTERNAL MAMMARY AND BILATERAL GREATER SAPHENOUS VEIN HARVESTED BY ENDOVIEN (N/A) TRANSESOPHAGEAL ECHOCARDIOGRAM (TEE) (N/A) CLIPPING OF ATRIAL APPENDAGE (Left) LEFT ENDARTERECTOMY CAROTID DACRON PATCH ANGIOPLASTY (Left) Subjective: Awake on vent - moves all limbs but weak nsr on iv amio CXR mild edema min chest tube output  Objective: Vital signs in last 24 hours: Temp:  [95.2 F (35.1 C)-100 F (37.8 C)] 99.9 F (37.7 C) (03/29 0835) Pulse Rate:  [66-97] 95 (03/29 0800) Cardiac Rhythm:  [-] Normal sinus rhythm (03/29 0330) Resp:  [12-27] 16 (03/29 0800) BP: (61-121)/(52-69) 79/69 mmHg (03/29 0800) SpO2:  [95 %-100 %] 96 % (03/29 0806) Arterial Line BP: (92-140)/(49-74) 125/53 mmHg (03/29 0800) FiO2 (%):  [40 %-60 %] 40 % (03/29 0806) Weight:  [160 lb 11.5 oz (72.9 kg)] 160 lb 11.5 oz (72.9 kg) (03/29 0452)  Hemodynamic parameters for last 24 hours: PAP: (26-44)/(12-29) 28/12 mmHg CO:  [2.7 L/min-4.4 L/min] 3.7 L/min CI:  [1.6 L/min/m2-2.6 L/min/m2] 2.2 L/min/m2  Intake/Output from previous day: 03/28 0701 - 03/29 0700 In: 10472.3 [I.V.:6569.3; Blood:2513; NG/GT:90; IV Piggyback:1300] Out: 4850 [Urine:2165; Emesis/NG output:75; Drains:50; Blood:2000; Chest Tube:560] Intake/Output this shift: Total I/O In: 250 [IV Piggyback:250] Out: -   Sleepy but responsive Mod peripheral edema extrem warm  Lab Results:  Recent Labs  03/10/16 1821 03/11/16 0330  WBC 21.2* 24.2*  HGB 9.5* 8.9*  HCT 28.4* 26.0*  PLT 63* 84*   BMET:  Recent Labs  03/10/16 0420  03/10/16 1632 03/10/16 1815 03/11/16 0330  NA 140  < > 138 139 140  K 3.6  < > 4.0 3.7 4.1  CL 108  < > 102  --  111  CO2 23  --   --   --  23  GLUCOSE 101*  < > 111* 103* 115*  BUN 34*  < > 26*  --  27*  CREATININE 1.63*  < > 1.00  --  1.71*  CALCIUM 10.1  --   --   --  9.4  < > = values in this interval not  displayed.  PT/INR:  Recent Labs  03/10/16 1821  LABPROT 22.6*  INR 2.00*   ABG    Component Value Date/Time   PHART 7.368 03/11/2016 0329   HCO3 25.4* 03/11/2016 0329   TCO2 27 03/11/2016 0329   ACIDBASEDEF 1.0 03/10/2016 2210   O2SAT 98.0 03/11/2016 0329   CBG (last 3)   Recent Labs  03/10/16 2203 03/11/16 0004 03/11/16 0329  GLUCAP 99 125* 106*    Assessment/Plan: S/P Procedure(s) (LRB): CORONARY ARTERY BYPASS GRAFTING (CABG)TIMES 4 USING LEFT INTERNAL MAMMARY AND BILATERAL GREATER SAPHENOUS VEIN HARVESTED BY ENDOVIEN (N/A) TRANSESOPHAGEAL ECHOCARDIOGRAM (TEE) (N/A) CLIPPING OF ATRIAL APPENDAGE (Left) LEFT ENDARTERECTOMY CAROTID DACRON PATCH ANGIOPLASTY (Left) Diuresis, rapid vent wean when more awake Leave drips for LV dysfx Follow low plts  LOS: 9 days    Kathlee Nationseter Van Trigt III 03/11/2016

## 2016-03-11 NOTE — Plan of Care (Signed)
Problem: Activity: Goal: Risk for activity intolerance will decrease Outcome: Not Progressing PT remains on vent  Problem: Bowel/Gastric: Goal: Gastrointestinal status for postoperative course will improve Outcome: Not Progressing No bowel sounds at this time  Problem: Cardiac: Goal: Ability to maintain an adequate cardiac output will improve Outcome: Progressing CI above 1.8  Problem: Education: Goal: Ability to demonstrate proper wound care will improve Outcome: Not Progressing Pt remains on vent

## 2016-03-11 NOTE — Care Management Note (Signed)
Case Management Note  Patient Details  Name: [name] [name] MRN: [id number] Date of Birth: [birth year]  Subjective/Objective:     S/p CABG               Action/Plan:  Pt is from home with wife   Expected Discharge Date:                  Expected Discharge Plan:  Home/Self Care  In-House Referral:     Discharge planning Services  CM Consult  Post Acute Care Choice:    Choice offered to:     DME Arranged:    DME Agency:     HH Arranged:    HH Agency:     Status of Service:  In process, will continue to follow  Medicare Important Message Given:  Yes Date Medicare IM Given:    Medicare IM give by:    Date Additional Medicare IM Given:    Additional Medicare Important Message give by:     If discussed at Long Length of Stay Meetings, dates discussed:    Additional Comments:  Cherylann ParrClaxton, Lyniah Fujita S, RN 03/11/2016, 11:42 AM

## 2016-03-11 NOTE — Progress Notes (Signed)
[facility]   Subjective  - still on vent, follows some commands   Objective 78/67 94 100 F (37.8 C) (Core (Comment)) 14 96%  Intake/Output Summary (Last 24 hours) at 03/11/16 0804 Last data filed at 03/11/16 0740  Gross per 24 hour  Intake 9710.59 ml  Output   4850 ml  Net 4860.59 ml   Left neck dressing dry.  JP minimal Neuro trys to follow commands, moves left and right hands but can't hold up against gravity,  Wiggles toes Difficult to assess but ? Some weakness of left arm compared to right Legs/feet edematous Opens eyes spontaneously  Assessment/Planning: Post CEA, difficult to assess completely in current state.  Will reassess after extubated and sedation out of his system. Recheck later today  Fabienne BrunsFields, Markavious Micco 03/11/2016 8:04 AM --  Laboratory Lab Results:  Recent Labs  03/10/16 1821 03/11/16 0330  WBC 21.2* 24.2*  HGB 9.5* 8.9*  HCT 28.4* 26.0*  PLT 63* 84*   BMET  Recent Labs  03/10/16 0420  03/10/16 1632 03/10/16 1815 03/11/16 0330  NA 140  < > 138 139 140  K 3.6  < > 4.0 3.7 4.1  CL 108  < > 102  --  111  CO2 23  --   --   --  23  GLUCOSE 101*  < > 111* 103* 115*  BUN 34*  < > 26*  --  27*  CREATININE 1.63*  < > 1.00  --  1.71*  CALCIUM 10.1  --   --   --  9.4  < > = values in this interval not displayed.  COAG Lab Results  Component Value Date   INR 2.00* 03/10/2016   INR 1.09 03/04/2016   No results found for: PTT

## 2016-03-12 ENCOUNTER — Inpatient Hospital Stay (HOSPITAL_COMMUNITY): Payer: Commercial Managed Care - HMO

## 2016-03-12 LAB — POCT I-STAT 3, VENOUS BLOOD GAS (G3P V)
ACID-BASE DEFICIT: 2 mmol/L (ref 0.0–2.0)
Bicarbonate: 24.7 mEq/L — ABNORMAL HIGH (ref 20.0–24.0)
O2 Saturation: 52 %
Patient temperature: 97.8
TCO2: 26 mmol/L (ref 0–100)
pCO2, Ven: 47.9 mmHg (ref 45.0–50.0)
pH, Ven: 7.318 — ABNORMAL HIGH (ref 7.250–7.300)
pO2, Ven: 30 mmHg — ABNORMAL LOW (ref 31.0–45.0)

## 2016-03-12 LAB — POCT I-STAT, CHEM 8
BUN: 37 mg/dL — AB (ref 6–20)
CALCIUM ION: 1.37 mmol/L — AB (ref 1.13–1.30)
CHLORIDE: 100 mmol/L — AB (ref 101–111)
Creatinine, Ser: 2.6 mg/dL — ABNORMAL HIGH (ref 0.61–1.24)
Glucose, Bld: 142 mg/dL — ABNORMAL HIGH (ref 65–99)
HEMATOCRIT: 25 % — AB (ref 39.0–52.0)
Hemoglobin: 8.5 g/dL — ABNORMAL LOW (ref 13.0–17.0)
Potassium: 3.8 mmol/L (ref 3.5–5.1)
SODIUM: 138 mmol/L (ref 135–145)
TCO2: 23 mmol/L (ref 0–100)

## 2016-03-12 LAB — GLUCOSE, CAPILLARY
GLUCOSE-CAPILLARY: 132 mg/dL — AB (ref 65–99)
GLUCOSE-CAPILLARY: 135 mg/dL — AB (ref 65–99)
GLUCOSE-CAPILLARY: 142 mg/dL — AB (ref 65–99)
GLUCOSE-CAPILLARY: 146 mg/dL — AB (ref 65–99)
GLUCOSE-CAPILLARY: 240 mg/dL — AB (ref 65–99)
GLUCOSE-CAPILLARY: 90 mg/dL (ref 65–99)
Glucose-Capillary: 151 mg/dL — ABNORMAL HIGH (ref 65–99)
Glucose-Capillary: 123 mg/dL — ABNORMAL HIGH (ref 65–99)
Glucose-Capillary: 116 mg/dL — ABNORMAL HIGH (ref 65–99)

## 2016-03-12 LAB — CBC
HCT: 23.5 % — ABNORMAL LOW (ref 39.0–52.0)
Hemoglobin: 7.8 g/dL — ABNORMAL LOW (ref 13.0–17.0)
MCH: 29.9 pg (ref 26.0–34.0)
MCHC: 33.2 g/dL (ref 30.0–36.0)
MCV: 90 fL (ref 78.0–100.0)
Platelets: 92 10*3/uL — ABNORMAL LOW (ref 150–400)
RBC: 2.61 MIL/uL — ABNORMAL LOW (ref 4.22–5.81)
RDW: 17.3 % — ABNORMAL HIGH (ref 11.5–15.5)
WBC: 24.6 10*3/uL — ABNORMAL HIGH (ref 4.0–10.5)

## 2016-03-12 LAB — BLOOD GAS, ARTERIAL
Acid-base deficit: 0.6 mmol/L (ref 0.0–2.0)
Bicarbonate: 24.2 mEq/L — ABNORMAL HIGH (ref 20.0–24.0)
O2 Content: 3 L/min
O2 Saturation: 95.4 %
Patient temperature: 98.6
TCO2: 25.6 mmol/L (ref 0–100)
pCO2 arterial: 44.4 mmHg (ref 35.0–45.0)
pH, Arterial: 7.356 (ref 7.350–7.450)
pO2, Arterial: 79.1 mmHg — ABNORMAL LOW (ref 80.0–100.0)

## 2016-03-12 LAB — BASIC METABOLIC PANEL
Anion gap: 9 (ref 5–15)
BUN: 36 mg/dL — ABNORMAL HIGH (ref 6–20)
CO2: 22 mmol/L (ref 22–32)
Calcium: 9.3 mg/dL (ref 8.9–10.3)
Chloride: 108 mmol/L (ref 101–111)
Creatinine, Ser: 2.59 mg/dL — ABNORMAL HIGH (ref 0.61–1.24)
GFR calc Af Amer: 26 mL/min — ABNORMAL LOW (ref >60)
GFR calc non Af Amer: 23 mL/min — ABNORMAL LOW (ref >60)
Glucose, Bld: 152 mg/dL — ABNORMAL HIGH (ref 65–99)
Potassium: 4.1 mmol/L (ref 3.5–5.1)
Sodium: 139 mmol/L (ref 135–145)

## 2016-03-12 LAB — PREPARE RBC (CROSSMATCH)

## 2016-03-12 MED ORDER — FUROSEMIDE 10 MG/ML IJ SOLN: 80.0000 mg | Freq: Two times a day (BID) | INTRAMUSCULAR | Status: DC

## 2016-03-12 MED ORDER — AMIODARONE HCL 200 MG PO TABS
200.0000 mg | ORAL_TABLET | Freq: Two times a day (BID) | ORAL | Status: DC
  Administered 2016-03-12 – 2016-03-13 (×3): 200 mg via ORAL
  Filled 2016-03-12 (×3): qty 1

## 2016-03-12 MED ORDER — FUROSEMIDE 10 MG/ML IJ SOLN
80.0000 mg | Freq: Two times a day (BID) | INTRAMUSCULAR | Status: DC
  Administered 2016-03-12 (×2): 80 mg via INTRAVENOUS
  Filled 2016-03-12 (×2): qty 8

## 2016-03-12 NOTE — Progress Notes (Addendum)
[facility] Progress Note  Subjective  - POD #2  Doing ok. Says right hand feels more clumsy than left.   Objective Filed Vitals:   03/12/16 0615 03/12/16 0630  BP:    Pulse: 89 90  Temp: 97.5 F (36.4 C) 97.7 F (36.5 C)  Resp: 15 17    Intake/Output Summary (Last 24 hours) at 03/12/16 0745 Last data filed at 03/12/16 0605  Gross per 24 hour  Intake 2241.84 ml  Output   1975 ml  Net 266.84 ml   Alert  Bilateral poor hand coordination, unable to pick up cup.  Left neck dressing dry.    Assessment/Planning: 75 y.o. male is s/p: combined left CEA and CABG 2 Days Post-Op   Successfully extubated yesterday.  Poor hand coordination bilaterally. PT/OT eval.    Ranae PlumberKimberly A Trinh 03/12/2016 7:45 AM --  Agree with above.  Overall seems neurologically intact pt perception of some right hand discoordination but objectively this seems similar to left.  Hopefully improve with PT/OT.  Does not appear to have had stroke.  Fabienne Brunsharles Briselda Naval, MD [facility] [place] Office: [phone] Pager: [phone]   Laboratory CBC    Component Value Date/Time   WBC 24.6* 03/12/2016 0420   HGB 7.8* 03/12/2016 0420   HCT 23.5* 03/12/2016 0420   PLT 92* 03/12/2016 0420    BMET    Component Value Date/Time   NA 139 03/12/2016 0420   K 4.1 03/12/2016 0420   CL 108 03/12/2016 0420   CO2 22 03/12/2016 0420   GLUCOSE 152* 03/12/2016 0420   BUN 36* 03/12/2016 0420   CREATININE 2.59* 03/12/2016 0420   CALCIUM 9.3 03/12/2016 0420   GFRNONAA 23* 03/12/2016 0420   GFRAA 26* 03/12/2016 0420    COAG Lab Results  Component Value Date   INR 2.00* 03/10/2016   INR 1.09 03/04/2016   No results found for: PTT  Antibiotics Anti-infectives    Start     Dose/Rate Route Frequency Ordered Stop   03/10/16 2315  vancomycin (VANCOCIN) IVPB 1000 mg/200 mL premix  Status:  Discontinued     1,000 mg 200 mL/hr over 60 Minutes Intravenous  Once  03/10/16 1759 03/10/16 1839   03/10/16 2300  vancomycin (VANCOCIN) IVPB 1000 mg/200 mL premix     1,000 mg 200 mL/hr over 60 Minutes Intravenous Every 12 hours 03/10/16 1839 03/11/16 1259   03/10/16 1915  cefUROXime (ZINACEF) 1.5 g in dextrose 5 % 50 mL IVPB     1.5 g 100 mL/hr over 30 Minutes Intravenous Every 12 hours 03/10/16 1759 03/12/16 0635   03/10/16 0400  vancomycin (VANCOCIN) 1,250 mg in sodium chloride 0.9 % 250 mL IVPB  Status:  Discontinued     1,250 mg 166.7 mL/hr over 90 Minutes Intravenous To Surgery 03/09/16 1439 03/10/16 0753   03/10/16 0400  cefUROXime (ZINACEF) 1.5 g in dextrose 5 % 50 mL IVPB  Status:  Discontinued     1.5 g 100 mL/hr over 30 Minutes Intravenous To Surgery 03/09/16 1439 03/10/16 0753   03/10/16 0400  cefUROXime (ZINACEF) 750 mg in dextrose 5 % 50 mL IVPB  Status:  Discontinued     750 mg 100 mL/hr over 30 Minutes Intravenous To Surgery 03/09/16 1438 03/10/16 0753   03/05/16 0400  vancomycin (VANCOCIN) 1,250 mg in sodium chloride 0.9 % 250 mL IVPB  Status:  Discontinued     1,250 mg 166.7 mL/hr over 90 Minutes Intravenous To Surgery 03/04/16 1608 03/04/16  1908   03/05/16 0400  cefUROXime (ZINACEF) 1.5 g in dextrose 5 % 50 mL IVPB  Status:  Discontinued     1.5 g 100 mL/hr over 30 Minutes Intravenous To Surgery 03/04/16 1608 03/04/16 1908   03/05/16 0400  cefUROXime (ZINACEF) 750 mg in dextrose 5 % 50 mL IVPB  Status:  Discontinued     750 mg 100 mL/hr over 30 Minutes Intravenous To Surgery 03/04/16 1608 03/04/16 1908       Maris Berger, PA-C [facility] Office: [phone] Pager: [phone] 03/12/2016 7:45 AM

## 2016-03-12 NOTE — Care Management Important Message (Signed)
Important Message  Patient Details  Name: [name] [name] MRN: [id number] Date of Birth: July 21, 1941   Medicare Important Message Given:  Yes    Kyla BalzarineShealy, Sundy Houchins [name] 03/12/2016, 1:59 PM

## 2016-03-12 NOTE — Progress Notes (Signed)
2 Days Post-Op Procedure(s) (LRB): CORONARY ARTERY BYPASS GRAFTING (CABG)TIMES 4 USING LEFT INTERNAL MAMMARY AND BILATERAL GREATER SAPHENOUS VEIN HARVESTED BY ENDOVIEN (N/A) TRANSESOPHAGEAL ECHOCARDIOGRAM (TEE) (N/A) CLIPPING OF ATRIAL APPENDAGE (Left) LEFT ENDARTERECTOMY CAROTID DACRON PATCH ANGIOPLASTY (Left) Subjective: Progressing except for acute on chronic renal failure- creat 2.7 Good cardiac output Neuro weak but intact CXR w/ mild edema  Objective: Vital signs in last 24 hours: Temp:  [97.3 F (36.3 C)-100 F (37.8 C)] 97.7 F (36.5 C) (03/30 0630) Pulse Rate:  [86-100] 90 (03/30 0630) Cardiac Rhythm:  [-] Normal sinus rhythm (03/30 0400) Resp:  [15-27] 17 (03/30 0630) BP: (73-96)/(53-72) 77/53 mmHg (03/30 0600) SpO2:  [92 %-99 %] 97 % (03/30 0630) Arterial Line BP: (103-182)/(46-93) 123/54 mmHg (03/30 0630) FiO2 (%):  [40 %] 40 % (03/29 1100) Weight:  [162 lb 4.8 oz (73.619 kg)] 162 lb 4.8 oz (73.619 kg) (03/30 0500)  Hemodynamic parameters for last 24 hours: PAP: (28-50)/(11-30) 42/16 mmHg CO:  [3.7 L/min-5.4 L/min] 3.7 L/min CI:  [2.2 L/min/m2-3.2 L/min/m2] 2.2 L/min/m2  Intake/Output from previous day: 03/29 0701 - 03/30 0700 In: 2491.8 [P.O.:300; I.V.:1541.8; IV Piggyback:650] Out: 1975 [Urine:1180; Emesis/NG output:60; Drains:25; Chest Tube:710] Intake/Output this shift:         Exam    General- alert and comfortable   Lungs- clear without rales, wheezes   Cor- regular rate and rhythm, no murmur , gallop   Abdomen- soft, non-tender   Extremities - warm, non-tender, minimal edema   Neuro- oriented, appropriate, no focal weakness   Lab Results:  Recent Labs  03/11/16 1628 03/12/16 0420  WBC 22.6* 24.6*  HGB 8.3* 7.8*  HCT 24.9* 23.5*  PLT 94* 92*   BMET:  Recent Labs  03/11/16 0330 03/11/16 1618 03/11/16 1628 03/12/16 0420  NA 140 140  --  139  K 4.1 4.5  --  4.1  CL 111 105  --  108  CO2 23  --   --  22  GLUCOSE 115* 170*  --  152*   BUN 27* 30*  --  36*  CREATININE 1.71* 2.20* 2.29* 2.59*  CALCIUM 9.4  --   --  9.3    PT/INR:  Recent Labs  03/10/16 1821  LABPROT 22.6*  INR 2.00*   ABG    Component Value Date/Time   PHART 7.356 03/12/2016 0400   HCO3 24.2* 03/12/2016 0400   TCO2 25.6 03/12/2016 0400   ACIDBASEDEF 0.6 03/12/2016 0400   O2SAT 95.4 03/12/2016 0400   CBG (last 3)   Recent Labs  03/11/16 1959 03/12/16 0025 03/12/16 0342  GLUCAP 151* 146* 142*    Assessment/Plan: S/P Procedure(s) (LRB): CORONARY ARTERY BYPASS GRAFTING (CABG)TIMES 4 USING LEFT INTERNAL MAMMARY AND BILATERAL GREATER SAPHENOUS VEIN HARVESTED BY ENDOVIEN (N/A) TRANSESOPHAGEAL ECHOCARDIOGRAM (TEE) (N/A) CLIPPING OF ATRIAL APPENDAGE (Left) LEFT ENDARTERECTOMY CAROTID DACRON PATCH ANGIOPLASTY (Left) Mobilize Diuresis Diabetes control d/c tubes/lines transfuse 1 unit RBCs for expected post op anemia   LOS: 10 days    Kathlee Nationseter Van Trigt III 03/12/2016

## 2016-03-12 NOTE — Progress Notes (Signed)
[facility] BRIEF SICU PROGRESS NOTE  2 Days Post-Op  S/P Procedure(s) (LRB): CORONARY ARTERY BYPASS GRAFTING (CABG)TIMES 4 USING LEFT INTERNAL MAMMARY AND BILATERAL GREATER SAPHENOUS VEIN HARVESTED BY ENDOVIEN (N/A) TRANSESOPHAGEAL ECHOCARDIOGRAM (TEE) (N/A) CLIPPING OF ATRIAL APPENDAGE (Left) LEFT ENDARTERECTOMY CAROTID DACRON PATCH ANGIOPLASTY (Left)   Stable day NSR w/ stable BP on milrinone and dopamine Breathing comfortably w/ O2 sats 94-97% on 2 L/min UOP adequate  Plan: Continue current plan  Purcell Nailslarence H Shamra Bradeen, MD 03/12/2016 7:35 PM

## 2016-03-13 ENCOUNTER — Inpatient Hospital Stay (HOSPITAL_COMMUNITY): Payer: Commercial Managed Care - HMO

## 2016-03-13 LAB — TYPE AND SCREEN
ABO/RH(D): A POS
Antibody Screen: NEGATIVE
Unit division: 0
Unit division: 0
Unit division: 0
Unit division: 0
Unit division: 0
Unit division: 0
Unit division: 0
Unit division: 0

## 2016-03-13 LAB — POCT I-STAT, CHEM 8
BUN: 30 mg/dL — ABNORMAL HIGH (ref 6–20)
CHLORIDE: 105 mmol/L (ref 101–111)
CREATININE: 1.5 mg/dL — AB (ref 0.61–1.24)
Calcium, Ion: 1.18 mmol/L (ref 1.13–1.30)
Glucose, Bld: 102 mg/dL — ABNORMAL HIGH (ref 65–99)
HEMATOCRIT: 22 % — AB (ref 39.0–52.0)
HEMOGLOBIN: 7.5 g/dL — AB (ref 13.0–17.0)
POTASSIUM: 2.9 mmol/L — AB (ref 3.5–5.1)
Sodium: 141 mmol/L (ref 135–145)
TCO2: 20 mmol/L (ref 0–100)

## 2016-03-13 LAB — CBC
HCT: 23.8 % — ABNORMAL LOW (ref 39.0–52.0)
Hemoglobin: 8.2 g/dL — ABNORMAL LOW (ref 13.0–17.0)
MCH: 30.7 pg (ref 26.0–34.0)
MCHC: 34.5 g/dL (ref 30.0–36.0)
MCV: 89.1 fL (ref 78.0–100.0)
Platelets: 100 10*3/uL — ABNORMAL LOW (ref 150–400)
RBC: 2.67 MIL/uL — ABNORMAL LOW (ref 4.22–5.81)
RDW: 16.9 % — ABNORMAL HIGH (ref 11.5–15.5)
WBC: 19.8 10*3/uL — ABNORMAL HIGH (ref 4.0–10.5)

## 2016-03-13 LAB — CARBOXYHEMOGLOBIN
Carboxyhemoglobin: 1.4 % (ref 0.5–1.5)
Methemoglobin: 0.8 % (ref 0.0–1.5)
O2 Saturation: 63.4 %
Total hemoglobin: 9.1 g/dL — ABNORMAL LOW (ref 13.5–18.0)

## 2016-03-13 LAB — POCT I-STAT 4, (NA,K, GLUC, HGB,HCT)
Glucose, Bld: 114 mg/dL — ABNORMAL HIGH (ref 65–99)
HCT: 31 % — ABNORMAL LOW (ref 39.0–52.0)
Hemoglobin: 10.5 g/dL — ABNORMAL LOW (ref 13.0–17.0)
Potassium: 4.1 mmol/L (ref 3.5–5.1)
SODIUM: 137 mmol/L (ref 135–145)

## 2016-03-13 LAB — GLUCOSE, CAPILLARY
GLUCOSE-CAPILLARY: 110 mg/dL — AB (ref 65–99)
GLUCOSE-CAPILLARY: 108 mg/dL — AB (ref 65–99)
GLUCOSE-CAPILLARY: 111 mg/dL — AB (ref 65–99)
Glucose-Capillary: 103 mg/dL — ABNORMAL HIGH (ref 65–99)
Glucose-Capillary: 109 mg/dL — ABNORMAL HIGH (ref 65–99)

## 2016-03-13 LAB — BASIC METABOLIC PANEL
Anion gap: 7 (ref 5–15)
BUN: 42 mg/dL — ABNORMAL HIGH (ref 6–20)
CO2: 25 mmol/L (ref 22–32)
Calcium: 9.1 mg/dL (ref 8.9–10.3)
Chloride: 106 mmol/L (ref 101–111)
Creatinine, Ser: 2.61 mg/dL — ABNORMAL HIGH (ref 0.61–1.24)
GFR calc Af Amer: 26 mL/min — ABNORMAL LOW (ref >60)
GFR calc non Af Amer: 22 mL/min — ABNORMAL LOW (ref >60)
Glucose, Bld: 121 mg/dL — ABNORMAL HIGH (ref 65–99)
Potassium: 4.1 mmol/L (ref 3.5–5.1)
Sodium: 138 mmol/L (ref 135–145)

## 2016-03-13 LAB — PREPARE RBC (CROSSMATCH)

## 2016-03-13 MED ORDER — ENOXAPARIN SODIUM 30 MG/0.3ML ~~LOC~~ SOLN
30.0000 mg | SUBCUTANEOUS | Status: DC
  Administered 2016-03-14 – 2016-03-18 (×5): 30 mg via SUBCUTANEOUS
  Filled 2016-03-13 (×5): qty 0.3

## 2016-03-13 MED ORDER — FUROSEMIDE 10 MG/ML IJ SOLN
40.0000 mg | Freq: Two times a day (BID) | INTRAMUSCULAR | Status: DC
  Administered 2016-03-13 – 2016-03-15 (×6): 40 mg via INTRAVENOUS
  Filled 2016-03-13 (×6): qty 4

## 2016-03-13 MED ORDER — AMIODARONE HCL 200 MG PO TABS
400.0000 mg | ORAL_TABLET | Freq: Two times a day (BID) | ORAL | Status: DC
  Administered 2016-03-13 – 2016-03-15 (×5): 400 mg via ORAL
  Filled 2016-03-13 (×5): qty 2

## 2016-03-13 MED ORDER — POTASSIUM CHLORIDE 10 MEQ/50ML IV SOLN: 10.0000 meq | Freq: Once | INTRAVENOUS | Status: DC

## 2016-03-13 MED ORDER — POTASSIUM CHLORIDE 10 MEQ/50ML IV SOLN
10.0000 meq | Freq: Once | INTRAVENOUS | Status: AC
  Administered 2016-03-13: 10 meq via INTRAVENOUS

## 2016-03-13 MED ORDER — POTASSIUM CHLORIDE 10 MEQ/50ML IV SOLN
10.0000 meq | INTRAVENOUS | Status: AC
  Administered 2016-03-13 (×3): 10 meq via INTRAVENOUS
  Filled 2016-03-13 (×4): qty 50

## 2016-03-13 MED ORDER — AMIODARONE IV BOLUS ONLY 150 MG/100ML
150.0000 mg | INTRAVENOUS | Status: AC
  Administered 2016-03-13: 150 mg via INTRAVENOUS
  Filled 2016-03-13: qty 100

## 2016-03-13 MED ORDER — AMIODARONE HCL 200 MG PO TABS
200.0000 mg | ORAL_TABLET | ORAL | Status: AC
  Administered 2016-03-13: 200 mg via ORAL
  Filled 2016-03-13: qty 1

## 2016-03-13 NOTE — Consult Note (Signed)
   [facility] Consult   03/13/2016  [name] [name] April 09, 1941 [id number] Patient screened for potential [facility] Care Management services. [facility]. This patient is eligible and will follow up if appropriate needs for community care management is needed with inpatient care management team.  Please place a [facility] consult for community follow up or for questions contact:   Charlesetta ShanksVictoria Lavar Rosenzweig, RN BSN CCM [facility] Liaison  [phone] business mobile phone Toll free office [phone]

## 2016-03-13 NOTE — Progress Notes (Signed)
[facility] Progress Note  Subjective  - POD #3  Feels better today. Says right hand is improving.   Objective Filed Vitals:   03/13/16 0600 03/13/16 0700  BP: 120/62 127/66  Pulse: 90 91  Temp:  98.4 F (36.9 C)  Resp: 15 20    Intake/Output Summary (Last 24 hours) at 03/13/16 0905 Last data filed at 03/13/16 0700  Gross per 24 hour  Intake   1593 ml  Output   1580 ml  Net     13 ml   Left neck incision intact. No hematoma 5/5 grip strength bilaterally. Tongue midline. Smile symmetric.   Assessment/Planning: 75 y.o. male is s/p: combined left CEA and CABG 3 Days Post-Op   Bilateral hand coordination improving. Do not suspect stroke.  Continue PT and OT.   Raymond GurneyKimberly A Yeng Frankie 03/13/2016 9:05 AM --  Laboratory CBC    Component Value Date/Time   WBC 19.8* 03/13/2016 0450   HGB 8.2* 03/13/2016 0450   HCT 23.8* 03/13/2016 0450   PLT 100* 03/13/2016 0450    BMET    Component Value Date/Time   NA 138 03/13/2016 0450   K 4.1 03/13/2016 0450   CL 106 03/13/2016 0450   CO2 25 03/13/2016 0450   GLUCOSE 121* 03/13/2016 0450   BUN 42* 03/13/2016 0450   CREATININE 2.61* 03/13/2016 0450   CALCIUM 9.1 03/13/2016 0450   GFRNONAA 22* 03/13/2016 0450   GFRAA 26* 03/13/2016 0450    COAG Lab Results  Component Value Date   INR 2.00* 03/10/2016   INR 1.09 03/04/2016   No results found for: PTT  Antibiotics Anti-infectives    Start     Dose/Rate Route Frequency Ordered Stop   03/10/16 2315  vancomycin (VANCOCIN) IVPB 1000 mg/200 mL premix  Status:  Discontinued     1,000 mg 200 mL/hr over 60 Minutes Intravenous  Once 03/10/16 1759 03/10/16 1839   03/10/16 2300  vancomycin (VANCOCIN) IVPB 1000 mg/200 mL premix     1,000 mg 200 mL/hr over 60 Minutes Intravenous Every 12 hours 03/10/16 1839 03/11/16 1259   03/10/16 1915  cefUROXime (ZINACEF) 1.5 g in dextrose 5 % 50 mL IVPB     1.5 g 100 mL/hr over 30 Minutes Intravenous Every 12 hours  03/10/16 1759 03/12/16 0635   03/10/16 0400  vancomycin (VANCOCIN) 1,250 mg in sodium chloride 0.9 % 250 mL IVPB  Status:  Discontinued     1,250 mg 166.7 mL/hr over 90 Minutes Intravenous To Surgery 03/09/16 1439 03/10/16 0753   03/10/16 0400  cefUROXime (ZINACEF) 1.5 g in dextrose 5 % 50 mL IVPB  Status:  Discontinued     1.5 g 100 mL/hr over 30 Minutes Intravenous To Surgery 03/09/16 1439 03/10/16 0753   03/10/16 0400  cefUROXime (ZINACEF) 750 mg in dextrose 5 % 50 mL IVPB  Status:  Discontinued     750 mg 100 mL/hr over 30 Minutes Intravenous To Surgery 03/09/16 1438 03/10/16 0753   03/05/16 0400  vancomycin (VANCOCIN) 1,250 mg in sodium chloride 0.9 % 250 mL IVPB  Status:  Discontinued     1,250 mg 166.7 mL/hr over 90 Minutes Intravenous To Surgery 03/04/16 1608 03/04/16 1908   03/05/16 0400  cefUROXime (ZINACEF) 1.5 g in dextrose 5 % 50 mL IVPB  Status:  Discontinued     1.5 g 100 mL/hr over 30 Minutes Intravenous To Surgery 03/04/16 1608 03/04/16 1908   03/05/16 0400  cefUROXime (ZINACEF) 750 mg in dextrose 5 %  50 mL IVPB  Status:  Discontinued     750 mg 100 mL/hr over 30 Minutes Intravenous To Surgery 03/04/16 1608 03/04/16 1908       Maris Berger, PA-C [facility] Office: [phone] Pager: [phone] 03/13/2016 9:05 AM

## 2016-03-13 NOTE — Op Note (Addendum)
NAMRae Roam:  [name] [name], [name] [name]                 ACCOUNT NO.:  [id number]  MEDICAL RECORD NO.:  [id number]  LOCATION:                               FACILITY:  [facility]  PHYSICIAN:  Kerin PernaPeter Van Trigt, M.D.  DATE OF BIRTH:  11-14-1941  DATE OF PROCEDURE: DATE OF DISCHARGE:                              OPERATIVE REPORT   OPERATION: 1. Coronary artery bypass grafting x4 (left internal mammary artery to     LAD, saphenous vein graft to first diagonal, saphenous vein graft     to obtuse marginal, saphenous vein graft to posterolateral branch     of the right coronary artery). 2. Endoscopic harvest of right greater saphenous vein and endoscopic     harvest of left greater saphenous vein. 3. Left Atrial clip Atricure Device 35 mm SURGEON:  Kerin PernaPeter Van Trigt, M.D.  ASSISTANT:  Rowe ClackWayne E. Gold, P.A.-C.  ANESTHESIA:  General.  PREOPERATIVE DIAGNOSES:  Severe 3-vessel coronary artery disease, previous inferior wall myocardial infarction, ischemic cardiomyopathy.  POSTOPERATIVE DIAGNOSES:  Severe 3-vessel coronary artery disease, previous inferior wall myocardial infarction, ischemic cardiomyopathy.  CLINICAL NOTE:  The patient is a 75 year old reformed smoker, who presented with symptoms of unstable angina and heart failure and was found to have positive cardiac enzymes.  Echocardiogram showed inferior wall hypokinesia with ejection fraction of 30%.  There was mild mitral regurgitation.  He was recommended for multivessel coronary bypass grafting.  As part of his evaluation, carotid Doppler studies demonstrated a severe 90% stenosis of the left internal carotid artery. He was evaluated by Vascular Surgery and recommended for combined carotid endarterectomy with CABG.  Prior to surgery, I discussed the procedure of CABG with the patient and his wife including the indications, benefits, and alternatives.  I discussed the details of the procedure including the use of general anesthesia and cardiopulmonary  bypass, the location of the surgical incisions, and the expected postoperative hospital recovery.  I discussed with the patient risks to him of coronary bypass surgery including risks of stroke, MI, bleeding, blood transfusion requirement, postoperative infection, postoperative lung problems including pleural effusion, and death.  After demonstrating his understanding to these issues, he agreed to proceed with surgery under what I felt was an informed consent.  OPERATIVE FINDINGS: 1. Adequate target vessels in the distal right coronary LAD and     diagonal with a suboptimal target in the circumflex. 2. Adequate conduit from the vein harvest from both legs and the     internal mammary artery. 3. Improved global LV function after surgical revascularization. 4. The patient's carotid endarterectomy was completed by Dr. Fabienne Brunsharles     Fields, who will dictate the procedure in a separate note.  OPERATIVE PROCEDURE:  The patient was brought to the operating room and placed supine on the operating table, where general anesthesia was induced.  A transesophageal echo probe was placed by the anesthesiologist.  The left neck, chest, abdomen, and legs were prepped with Betadine and draped as a sterile field.  A proper time-out was performed.  The carotid endarterectomy was performed by Dr. Darrick PennaFields and the neck was left open, but packed with a surgical sponge.  The sternal incision was made.  The saphenous vein was harvested first from the right leg and then later from the left leg using endoscopic technique.  The left internal mammary artery was harvested as a pedicle graft from its origin at the subclavian vessels.  It was 1.5 mm vessel which had excellent flow.  A sternal retractor was placed, and the pericardium was opened and suspended.  Heparin was administered. Pursestrings were placed in the ascending aorta and right atrium.  When the ACT was documented as being therapeutic, the patient was  cannulated and placed on cardiopulmonary bypass.  The coronaries were identified for grafting.  The distal right posterolateral, the LAD, the diagonal, and the OM were found to be adequate targets.  The OM was chronically occluded, intramyocardial.  The saphenous vein and mammary artery conduits were prepared for the distal anastomoses and cardioplegia cannulas were placed both antegrade and retrograde cold blood cardioplegia.  The patient was cooled to 32 degrees.  The aortic crossclamp was applied.  One liter of cold blood cardioplegia was delivered in split doses between the antegrade aortic and retrograde coronary sinus catheters.  There was good cardioplegic arrest with septal temperature dropped less than 14 degrees. Cardioplegia was delivered every 20 minutes or less with the crossclamp in place.  The distal coronary anastomoses were performed.  The first distal anastomosis was the posterolateral branch of the right coronary.  The right coronary had been totally occluded.  The vessel was 1.5 mm diameter.  A reverse saphenous vein was sewn end-to-side with running 7- 0 Prolene.  There was good flow through the graft.  Cardioplegia was redosed.  The second distal anastomosis was the OM branch of left circumflex. This was chronically occluded.  It was a difficult target, but graftable.  A reverse saphenous vein was sewn end-to-side with running 7- 0 Prolene.  There was satisfactory flow through the graft.  Cardioplegia was redosed.  The third distal anastomosis was large diagonal branch to the LAD.  This is a 1.5-mm vessel, proximal 80%-90% stenosis.  A reverse saphenous vein was sewn end-to-side with running 7-0 Prolene, and there was good flow through the graft.  Cardioplegia was redosed.  The fourth distal anastomosis was to the distal third of the LAD.  It had a proximal 95% stenosis.  The left IMA pedicle was brought through an opening and the left lateral pericardium was  brought down onto the LAD and sewn end-to-side with running 8-0 Prolene.  There was good flow through the anastomosis after briefly removing the bulldog on the mammary artery.  The bulldog was reapplied and the pedicle was secured to the epicardium with 6-0 Prolene.  Cardioplegia was redosed.  With the crossclamp still in place, 3 proximal vein anastomoses were performed on the ascending aorta using a 4.0 mm punch running 6-0 Prolene.  The ascending aorta had some atheromatous thickenings.  The 3 proximal anastomoses were constructed using a running 6-0 Prolene.  An air was vented from the coronaries with a dose of retrograde warm blood cardioplegia prior to removing the cross-clamp.The LA clip was applied to the appendage before removing cross clamp.  After removal of the cross clamp, the heart resumed a spontaneous rhythm.  The vein grafts were de-aired and opened and each had good flow and hemostasis was documented at the proximal and distal anastomoses.  The cardioplegic cannulas were removed.  Temporary pacing wires were applied.  The patient was rewarmed and reperfused.  The lungs were expanded.  The ventilator was resumed.  The patient  was then placed on low-dose milrinone, dopamine, and Levophed.  The patient then weaned off cardiopulmonary bypass without difficulty.  Cardiac output and blood pressure were normal.  Protamine was administered without adverse reaction.  The echo showed improved global LV function without significant MR.  The sternal retractor was removed.  The superior pericardial fat was closed over the aorta.  Posterior mediastinal and left pleural chest tube were placed and brought out through separate incisions.  The sternum was closed interrupted steel wire.  The patient remained stable. The pectoralis fascia was closed with a running #1 Vicryl.  The subcutaneous and skin layers were closed in running Vicryl and sterile dressings were applied.  Total  cardiopulmonary bypass time was 140 minutes.     Kerin Perna, M.D.   ______________________________ Kerin Perna, M.D.    PV/MEDQ  D:  03/12/2016  T:  03/13/2016  Job:  [id number]

## 2016-03-13 NOTE — Op Note (Deleted)
NAMRae Roam:  Brotherton, Carliss                 ACCOUNT NO.:  [id number]  MEDICAL RECORD NO.:  [id number]  LOCATION:                               FACILITY:  [facility]  PHYSICIAN:  Kerin PernaPeter Van Trigt, M.D.  DATE OF BIRTH:  1941/05/13  DATE OF PROCEDURE: DATE OF DISCHARGE:                              OPERATIVE REPORT   OPERATION: 1. Coronary artery bypass grafting x4 (left internal mammary artery to     LAD, saphenous vein graft to first diagonal, saphenous vein graft     to obtuse marginal, saphenous vein graft to posterolateral branch     of the right coronary artery). 2. Endoscopic harvest of right greater saphenous vein and endoscopic     harvest of left greater saphenous vein.  SURGEON:  Kerin PernaPeter Van Trigt, M.D.  ASSISTANT:  Rowe ClackWayne E. Gold, P.A.-C.  ANESTHESIA:  General.  PREOPERATIVE DIAGNOSES:  Severe 3-vessel coronary artery disease, previous inferior wall myocardial infarction, ischemic cardiomyopathy.  POSTOPERATIVE DIAGNOSES:  Severe 3-vessel coronary artery disease, previous inferior wall myocardial infarction, ischemic cardiomyopathy.  CLINICAL NOTE:  The patient is a 75 year old reformed smoker, who presented with symptoms of unstable angina and heart failure and was found to have positive cardiac enzymes.  Echocardiogram showed inferior wall hypokinesia with ejection fraction of 30%.  There was mild mitral regurgitation.  He was recommended for multivessel coronary bypass grafting.  As part of his evaluation, carotid Doppler studies demonstrated a severe 90% stenosis of the left internal carotid artery. He was evaluated by Vascular Surgery and recommended for combined carotid endarterectomy with CABG.  Prior to surgery, I discussed the procedure of CABG with the patient and his wife including the indications, benefits, and alternatives.  I discussed the details of the procedure including the use of general anesthesia and cardiopulmonary bypass, the location of the  surgical incisions, and the expected postoperative hospital recovery.  I discussed with the patient risks to him of coronary bypass surgery including risks of stroke, MI, bleeding, blood transfusion requirement, postoperative infection, postoperative lung problems including pleural effusion, and death.  After demonstrating his understanding to these issues, he agreed to proceed with surgery under what I felt was an informed consent.  OPERATIVE FINDINGS: 1. Adequate target vessels in the distal right coronary LAD and     diagonal with a suboptimal target in the circumflex. 2. Adequate conduit from the vein harvest from both legs and the     internal mammary artery. 3. Improved global LV function after surgical revascularization. 4. The patient's carotid endarterectomy was completed by Dr. Fabienne Brunsharles     Fields, who will dictate the procedure in a separate note.  OPERATIVE PROCEDURE:  The patient was brought to the operating room and placed supine on the operating table, where general anesthesia was induced.  A transesophageal echo probe was placed by the anesthesiologist.  The left neck, chest, abdomen, and legs were prepped with Betadine and draped as a sterile field.  A proper time-out was performed.  The carotid endarterectomy was performed by Dr. Darrick PennaFields and the neck was left open, but packed with a surgical sponge.  The sternal incision was made.  The saphenous vein was harvested first  from the right leg and then later from the left leg using endoscopic technique.  The left internal mammary artery was harvested as a pedicle graft from its origin at the subclavian vessels.  It was 1.5 mm vessel which had excellent flow.  A sternal retractor was placed, and the pericardium was opened and suspended.  Heparin was administered. Pursestrings were placed in the ascending aorta and right atrium.  When the ACT was documented as being therapeutic, the patient was cannulated and placed on  cardiopulmonary bypass.  The coronaries were identified for grafting.  The distal right posterolateral, the LAD, the diagonal, and the OM were found to be adequate targets.  The OM was chronically occluded, intramyocardial.  The saphenous vein and mammary artery conduits were prepared for the distal anastomoses and cardioplegia cannulas were placed both antegrade and retrograde cold blood cardioplegia.  The patient was cooled to 32 degrees.  The aortic crossclamp was applied.  One liter of cold blood cardioplegia was delivered in split doses between the antegrade aortic and retrograde coronary sinus catheters.  There was good cardioplegic arrest with septal temperature dropped less than 14 degrees. Cardioplegia was delivered every 20 minutes or less with the crossclamp in place.  The distal coronary anastomoses were performed.  The first distal anastomosis was the posterolateral branch of the right coronary.  The right coronary had been totally occluded.  The vessel was 1.5 mm diameter.  A reverse saphenous vein was sewn end-to-side with running 7- 0 Prolene.  There was good flow through the graft.  Cardioplegia was redosed.  The second distal anastomosis was the OM branch of left circumflex. This was chronically occluded.  It was a difficult target, but graftable.  A reverse saphenous vein was sewn end-to-side with running 7- 0 Prolene.  There was satisfactory flow through the graft.  Cardioplegia was redosed.  The third distal anastomosis was large diagonal branch to the LAD.  This is a 1.5-mm vessel, proximal 80%-90% stenosis.  A reverse saphenous vein was sewn end-to-side with running 7-0 Prolene, and there was good flow through the graft.  Cardioplegia was redosed.  The fourth distal anastomosis was to the distal third of the LAD.  It had a proximal 95% stenosis.  The left IMA pedicle was brought through an opening and the left lateral pericardium was brought down onto  the LAD and sewn end-to-side with running 8-0 Prolene.  There was good flow through the anastomosis after briefly removing the bulldog on the mammary artery.  The bulldog was reapplied and the pedicle was secured to the epicardium with 6-0 Prolene.  Cardioplegia was redosed.  With the crossclamp still in place, 3 proximal vein anastomoses were performed on the ascending aorta using a 4.0 mm punch running 6-0 Prolene.  The ascending aorta had some atheromatous thickenings.  The 3 proximal anastomoses were constructed using a running 6-0 Prolene.  An air was vented from the coronaries with a dose of retrograde warm blood cardioplegia prior to removing the cross-clamp.  After removal of the cross clamp, the heart resumed a spontaneous rhythm.  The vein grafts were de-aired and opened and each had good flow and hemostasis was documented at the proximal and distal anastomoses.  The cardioplegic cannulas were removed.  Temporary pacing wires were applied.  The patient was rewarmed and reperfused.  The lungs were expanded.  The ventilator was resumed.  The patient was then placed on low-dose milrinone, dopamine, and Levophed.  The patient then weaned off cardiopulmonary bypass  without difficulty.  Cardiac output and blood pressure were normal.  Protamine was administered without adverse reaction.  The echo showed improved global LV function without significant MR.  The sternal retractor was removed.  The superior pericardial fat was closed over the aorta.  Posterior mediastinal and left pleural chest tube were placed and brought out through separate incisions.  The sternum was closed interrupted steel wire.  The patient remained stable. The pectoralis fascia was closed with a running #1 Vicryl.  The subcutaneous and skin layers were closed in running Vicryl and sterile dressings were applied.  Total cardiopulmonary bypass time was 140 minutes.     Kerin Perna,  M.D.   ______________________________ Kerin Perna, M.D.    PV/MEDQ  D:  03/12/2016  T:  03/13/2016  Job:  [id number]

## 2016-03-13 NOTE — Progress Notes (Signed)
3 Days Post-Op Procedure(s) (LRB): CORONARY ARTERY BYPASS GRAFTING (CABG)TIMES 4 USING LEFT INTERNAL MAMMARY AND BILATERAL GREATER SAPHENOUS VEIN HARVESTED BY ENDOVIEN (N/A) TRANSESOPHAGEAL ECHOCARDIOGRAM (TEE) (N/A) CLIPPING OF ATRIAL APPENDAGE (Left) LEFT ENDARTERECTOMY CAROTID DACRON PATCH ANGIOPLASTY (Left) Subjective: 75 year old diabetic presented with heart failure, atrial fibrillation, and  unstable angina, and elevated creatinine.Marland Kitchen. He was found have severe three-vessel coronary disease with poor LV function.he had a tight left carotid stenosis 90%. He underwent multivessel CABG and left atrial clipand combined left carotid endarterectomy. Postoperatively he has had acute on chronic renal insufficiency and remains on renal dose dopamine. He was in sinus rhythm for 48 hours but now is back in chronic atrial fibrillation. He was on no anticoagulation on presentation to the hospital. His pulmonary status is improving. He still remains very weak and needs 2 person assist to get out of bed.  Objective: Vital signs in last 24 hours: Temp:  [97.4 F (36.3 C)-98.7 F (37.1 C)] 98.7 F (37.1 C) (03/31 1600) Pulse Rate:  [83-132] 108 (03/31 1600) Cardiac Rhythm:  [-] Atrial fibrillation (03/31 1000) Resp:  [14-20] 17 (03/31 1600) BP: (109-157)/(54-89) 145/89 mmHg (03/31 1600) SpO2:  [92 %-99 %] 94 % (03/31 1600) Weight:  [160 lb 15 oz (73 kg)] 160 lb 15 oz (73 kg) (03/31 0500)  Hemodynamic parameters for last 24 hours:  patient developed atrial fibrillation, rate controlled today  Intake/Output from previous day: 03/30 0701 - 03/31 0700 In: 1623 [P.O.:400; I.V.:763; Blood:335] Out: 1630 [Urine:1140; Chest Tube:490] Intake/Output this shift: Total I/O In: 517.7 [I.V.:211.7; Blood:306] Out: 1980 [Urine:1630; Chest Tube:350]       Exam    General- alert and comfortable   Lungs- clear without rales, wheezes   Cor- irregular rate and rhythm, no murmur , gallop   Abdomen- soft,  non-tender   Extremities - warm, non-tender, minimal edema   Neuro- oriented, appropriate, no focal weakness   Lab Results:  Recent Labs  03/12/16 0420  03/13/16 0450 03/13/16 1602  WBC 24.6*  --  19.8*  --   HGB 7.8*  < > 8.2* 7.5*  HCT 23.5*  < > 23.8* 22.0*  PLT 92*  --  100*  --   < > = values in this interval not displayed. BMET:  Recent Labs  03/12/16 0420  03/13/16 0450 03/13/16 1602  NA 139  < > 138 141  K 4.1  < > 4.1 2.9*  CL 108  < > 106 105  CO2 22  --  25  --   GLUCOSE 152*  < > 121* 102*  BUN 36*  < > 42* 30*  CREATININE 2.59*  < > 2.61* 1.50*  CALCIUM 9.3  --  9.1  --   < > = values in this interval not displayed.  PT/INR:  Recent Labs  03/10/16 1821  LABPROT 22.6*  INR 2.00*   ABG    Component Value Date/Time   PHART 7.356 03/12/2016 0400   HCO3 24.7* 03/12/2016 1622   TCO2 20 03/13/2016 1602   ACIDBASEDEF 2.0 03/12/2016 1622   O2SAT 63.4 03/13/2016 0449   CBG (last 3)   Recent Labs  03/13/16 0353 03/13/16 0823 03/13/16 1202  GLUCAP 108* 111* 103*    Assessment/Plan: S/P Procedure(s) (LRB): CORONARY ARTERY BYPASS GRAFTING (CABG)TIMES 4 USING LEFT INTERNAL MAMMARY AND BILATERAL GREATER SAPHENOUS VEIN HARVESTED BY ENDOVIEN (N/A) TRANSESOPHAGEAL ECHOCARDIOGRAM (TEE) (N/A) CLIPPING OF ATRIAL APPENDAGE (Left) LEFT ENDARTERECTOMY CAROTID DACRON PATCH ANGIOPLASTY (Left) Continue by mouth amiodarone for rate control  atrial fib Continue renal dose dopamine until creatinine is at baseline 1.6 Transfusion for postoperative loss anemia Physical therapy  Low-dose Lovenox for his atrial fibrillation, not a good Coumadin candidate for long-term.  LOS: 11 days    [name] [name] 03/13/2016

## 2016-03-13 NOTE — Evaluation (Signed)
Physical Therapy Evaluation Patient Details Name: [name] [name] MRN: [id number] DOB: Sep 26, 1941 Today's Date: 03/13/2016   History of Present Illness  Patient is a 75 y/o male who does not regularly seek medical care who presents with SOB and was found to have acute systolic and diastolic heart failure. Pt s/p CABG, CLIPPING OF ATRIAL APPENDAGE and LEFT ENDARTERECTOMY CAROTID   Clinical Impression  Patient presents with generalized weakness, decreased balance, endurance and overall mobilty s/p above surgery and new sternal precautions. Pt independent PTA. Pt not great at pacing self and needs cues to stop and rest due to dyspnea. Requires Min A for transfers and ambulation. Pt motivated and will progress well. Will follow acutely to maximize independence and mobility prior to return home.    Follow Up Recommendations Home health PT;Supervision/Assistance - 24 hour    Equipment Recommendations  Rolling walker with 5" wheels    Recommendations for Other Services OT consult     Precautions / Restrictions Precautions Precautions: Fall;Sternal Precaution Comments: Instructed pt in sternal precautions. Restrictions Weight Bearing Restrictions: Yes Other Position/Activity Restrictions: sternal precautions      Mobility  Bed Mobility Overal bed mobility: Needs Assistance;+2 for physical assistance Bed Mobility: Sit to Sidelying         Sit to sidelying: +2 for safety/equipment;Mod assist General bed mobility comments: Assist with trunk and to bring BLEs into bed. Pt holding heart pillow.  Transfers Overall transfer level: Needs assistance Equipment used:  (w/c) Transfers: Sit to/from Stand Sit to Stand: Min assist;+2 physical assistance         General transfer comment: Stood from EOB x1 with cues for technique. Use of momentum to stand holding heart pillow. Stood from Landscape architectchair x2.  Ambulation/Gait Ambulation/Gait assistance: Min guard Ambulation Distance (Feet): 200  Feet Assistive device:  (w/c) Gait Pattern/deviations: Step-through pattern;Decreased stride length;Trunk flexed Gait velocity: decreased   General Gait Details: Slow, mildly unsteady gait. 1 seated rest break. HR up to 133 once back in bed. Ambulated on 2L/min 02. Sp02 down to 80s. 2/4 DOE. Needs cues for pursed lip breathing and to sit and rest.   Stairs            Wheelchair Mobility    Modified Rankin (Stroke Patients Only)       Balance Overall balance assessment: Needs assistance Sitting-balance support: Feet supported;No upper extremity supported Sitting balance-Leahy Scale: Good     Standing balance support: During functional activity Standing balance-Leahy Scale: Poor Standing balance comment: Reliant on BUEs for support.                              Pertinent Vitals/Pain Pain Assessment: No/denies pain    Home Living Family/patient expects to be discharged to:: Private residence Living Arrangements: Spouse/significant other Available Help at Discharge: Family;Available 24 hours/day Type of Home: House Home Access: Level entry     Home Layout: One level Home Equipment: Psychologist, educationalTransport chair;Walker - 2 wheels;Cane - single point      Prior Function Level of Independence: Independent         Comments: drives     Hand Dominance   Dominant Hand: Right    Extremity/Trunk Assessment   Upper Extremity Assessment: Defer to OT evaluation           Lower Extremity Assessment: Generalized weakness      Cervical / Trunk Assessment: Kyphotic  Communication   Communication: No difficulties  Cognition Arousal/Alertness: Awake/alert Behavior During  Therapy: WFL for tasks assessed/performed Overall Cognitive Status: Impaired/Different from baseline Area of Impairment: Orientation Orientation Level: Disoriented to;Time                  General Comments      Exercises        Assessment/Plan    PT Assessment Patient needs  continued PT services  PT Diagnosis Difficulty walking;Generalized weakness   PT Problem List Decreased strength;Cardiopulmonary status limiting activity;Decreased activity tolerance;Decreased balance;Decreased mobility;Decreased knowledge of precautions;Decreased safety awareness;Decreased knowledge of use of DME;Decreased cognition  PT Treatment Interventions Balance training;Gait training;Functional mobility training;Therapeutic activities;Therapeutic exercise;Patient/family education   PT Goals (Current goals can be found in the Care Plan section) Acute Rehab PT Goals Patient Stated Goal: return home with wife PT Goal Formulation: With patient Time For Goal Achievement: 03/27/16 Potential to Achieve Goals: Fair    Frequency Min 3X/week   Barriers to discharge        Co-evaluation PT/OT/SLP Co-Evaluation/Treatment: Yes Reason for Co-Treatment: Complexity of the patient's impairments (multi-system involvement);For patient/therapist safety           End of Session Equipment Utilized During Treatment: Gait belt;Oxygen Activity Tolerance: Patient tolerated treatment well Patient left: in bed;with call bell/phone within reach;with bed alarm set Nurse Communication: Mobility status         Time: 0930-1000 PT Time Calculation (min) (ACUTE ONLY): 30 min   Charges:   PT Evaluation $PT Eval Moderate Complexity: 1 Procedure     PT G Codes:        Ismerai Bin A Rogers Ditter 03/13/2016, 1:22 PM Mylo Red, PT, DPT [phone]

## 2016-03-13 NOTE — Evaluation (Signed)
Clinical/Bedside Swallow Evaluation Patient Details  Name: [name] [name] MRN: [id number] Date of Birth: [birth year]  Today's Date: 03/13/2016 Time: SLP Start Time (ACUTE ONLY): 1414 SLP Stop Time (ACUTE ONLY): 1431 SLP Time Calculation (min) (ACUTE ONLY): 17 min  Past Medical History: History reviewed. No pertinent past medical history. Past Surgical History:  Past Surgical History  Procedure Laterality Date  . Cardiac catheterization N/A 03/04/2016    Procedure: Right/Left Heart Cath and Coronary Angiography;  Surgeon: Lennette Bihari, MD;  Location: [facility] INVASIVE CV LAB;  Service: Cardiovascular;  Laterality: N/A;  . Coronary artery bypass graft N/A 03/10/2016    Procedure: CORONARY ARTERY BYPASS GRAFTING (CABG)TIMES 4 USING LEFT INTERNAL MAMMARY AND BILATERAL GREATER SAPHENOUS VEIN HARVESTED BY ENDOVIEN;  Surgeon: Kerin Perna, MD;  Location: [facility] OR;  Service: Open Heart Surgery;  Laterality: N/A;  . Tee without cardioversion N/A 03/10/2016    Procedure: TRANSESOPHAGEAL ECHOCARDIOGRAM (TEE);  Surgeon: Kerin Perna, MD;  Location: [facility] OR;  Service: Open Heart Surgery;  Laterality: N/A;  . Clipping of atrial appendage Left 03/10/2016    Procedure: CLIPPING OF ATRIAL APPENDAGE;  Surgeon: Kerin Perna, MD;  Location: [facility] OR;  Service: Open Heart Surgery;  Laterality: Left;  . Endarterectomy Left 03/10/2016    Procedure: LEFT ENDARTERECTOMY CAROTID DACRON PATCH ANGIOPLASTY;  Surgeon: Sherren Kerns, MD;  Location: [facility] OR;  Service: Vascular;  Laterality: Left;   HPI:  Patient is a 75 y/o male who does not regularly seek medical care who presents with SOB and was found to have acute systolic and diastolic heart failure. Pt s/p CABG, CLIPPING OF ATRIAL APPENDAGE and left CEA.  Intubated 3/28-29.     Assessment / Plan / Recommendation Clinical Impression  Pt describes symptoms concerning for a primary esophageal dysphagia, feeling like pills and more solid foods get "stuck" in his throat. He says  he only has difficulty with carbonated beverages. Occasionally, he has to regurgitate pills/food. During skilled observation there are no overt s/s of aspiration. Swallow appears to occur swiftly with thin liquids, although mastication is mildly prolonged with regular textures. He attributes this to his upper dentures as well as dryness of cracker. He is agreeable to try Dys 3 diet and thin liquids, crushing meds in puree and utilizing aspiration and esophageal precautions. SLP to f/u briefly for tolerance.    Aspiration Risk  Mild aspiration risk    Diet Recommendation Dysphagia 3 (Mech soft);Thin liquid   Liquid Administration via: Cup;Straw Medication Administration: Crushed with puree Supervision: Patient able to self feed;Intermittent supervision to cue for compensatory strategies Compensations: Slow rate;Small sips/bites;Follow solids with liquid Postural Changes: Seated upright at 90 degrees;Remain upright for at least 30 minutes after po intake    Other  Recommendations Oral Care Recommendations: Oral care BID   Follow up Recommendations   (tba)    Frequency and Duration min 2x/week  1 week       Prognosis Prognosis for Safe Diet Advancement: Fair Barriers to Reach Goals: Other (Comment) (? esophageal component)      Swallow Study   General HPI: Patient is a 75 y/o male who does not regularly seek medical care who presents with SOB and was found to have acute systolic and diastolic heart failure. Pt s/p CABG, CLIPPING OF ATRIAL APPENDAGE and left CEA.  Intubated 3/28-29.   Type of Study: Bedside Swallow Evaluation Previous Swallow Assessment: none in chart Diet Prior to this Study: Regular;Thin liquids Temperature Spikes Noted: No Respiratory Status: Room air  History of Recent Intubation: Yes Length of Intubations (days): 2 days Date extubated: 03/11/16 Behavior/Cognition: Alert;Cooperative;Pleasant mood Oral Cavity Assessment: Within Functional Limits Oral Care  Completed by SLP: No Oral Cavity - Dentition: Dentures, top Vision: Functional for self-feeding Self-Feeding Abilities: Able to feed self;Needs set up Patient Positioning: Upright in bed Baseline Vocal Quality: Normal    Oral/Motor/Sensory Function Overall Oral Motor/Sensory Function: Within functional limits   Ice Chips Ice chips: Not tested   Thin Liquid Thin Liquid: Within functional limits Presentation: Self Fed;Straw    Nectar Thick Nectar Thick Liquid: Not tested   Honey Thick Honey Thick Liquid: Not tested   Puree Puree: Not tested   Solid   GO   Solid: Impaired Presentation: Self Fed Oral Phase Functional Implications: Impaired mastication;Other (comment) (prolonged mastication, pt says "too dry")       [name] [name], [name] [name]  [name] [name], [name] [name] 03/13/2016,2:41 PM

## 2016-03-13 NOTE — Care Management Note (Signed)
Case Management Note  Patient Details  Name: [name] MRN: [id number] Date of Birth: [birth year]  Subjective/Objective:     S/p CABG               Action/Plan:  Pt is from home with wife.  [facility] via physician sticky note.  CM will continue to monitor   Expected Discharge Date:                  Expected Discharge Plan:  Home/Self Care  In-House Referral:     Discharge planning Services  CM Consult  Post Acute Care Choice:    Choice offered to:     DME Arranged:    DME Agency:     HH Arranged:    HH Agency:     Status of Service:  In process, will continue to follow  Medicare Important Message Given:  Yes Date Medicare IM Given:    Medicare IM give by:    Date Additional Medicare IM Given:    Additional Medicare Important Message give by:     If discussed at Long Length of Stay Meetings, dates discussed:    Additional Comments:  Cherylann ParrClaxton, Caison Hearn S, RN 03/13/2016, 4:24 PM

## 2016-03-13 NOTE — Evaluation (Signed)
Occupational Therapy Evaluation Patient Details Name: [name] [name] MRN: [name] DOB: August 10, 1941 Today's Date: 03/13/2016    History of Present Illness Patient is a 75 y/o male who does not regularly seek medical care who presents with SOB and was found to have acute systolic and diastolic heart failure. Pt s/p CABG, CLIPPING OF ATRIAL APPENDAGE and LEFT ENDARTERECTOMY CAROTID    Clinical Impression   Pt was independent prior to admission. Performing remarkably well POD#2 with mobility. Pt presents with generalized weakness, impaired balance, decreased activity tolerance and impaired orientation to time.  Pt needs to be able to care for himself at a supervision level upon return home as his wife cannot provide physical assistance. Will follow acutely.    Follow Up Recommendations  Home health OT;Supervision/Assistance - 24 hour    Equipment Recommendations  3 in 1 bedside comode    Recommendations for Other Services       Precautions / Restrictions Precautions Precautions: Fall;Sternal Precaution Comments: Instructed pt in sternal precautions. Restrictions Weight Bearing Restrictions: Yes Other Position/Activity Restrictions: sternal precautions      Mobility Bed Mobility Overal bed mobility: Needs Assistance;+2 for physical assistance Bed Mobility: Sit to Sidelying         Sit to sidelying: +2 for safety/equipment;Mod assist General bed mobility comments: Assist with trunk and to bring BLEs into bed. Pt holding heart pillow.  Transfers Overall transfer level: Needs assistance Equipment used:  (w/c) Transfers: Sit to/from Stand Sit to Stand: Min assist;+2 physical assistance         General transfer comment: Stood from EOB x1 with cues for technique. Use of momentum to stand holding heart pillow. Stood from Landscape architectchair x2.    Balance Overall balance assessment: Needs assistance Sitting-balance support: Feet supported;No upper extremity supported Sitting  balance-Leahy Scale: Good     Standing balance support: During functional activity Standing balance-Leahy Scale: Poor Standing balance comment: Reliant on BUEs for support.                             ADL Overall ADL's : Needs assistance/impaired Eating/Feeding: Set up;Bed level Eating/Feeding Details (indicate cue type and reason): ice chips with spoon Grooming: Set up;Sitting   Upper Body Bathing: Minimal assitance;Sitting   Lower Body Bathing: Maximal assistance;Sit to/from stand   Upper Body Dressing : Minimal assistance;Sitting   Lower Body Dressing: Maximal assistance;Sit to/from stand   Toilet Transfer: Minimal assistance;RW;Ambulation;+2 for safety/equipment Toilet Transfer Details (indicate cue type and reason): simulated         Functional mobility during ADLs: Minimal assistance;+2 for safety/equipment       Vision     Perception     Praxis      Pertinent Vitals/Pain Pain Assessment: No/denies pain     Hand Dominance Right   Extremity/Trunk Assessment Upper Extremity Assessment Upper Extremity Assessment: Defer to OT evaluation   Lower Extremity Assessment Lower Extremity Assessment: Generalized weakness   Cervical / Trunk Assessment Cervical / Trunk Assessment: Kyphotic   Communication Communication Communication: No difficulties   Cognition Arousal/Alertness: Awake/alert Behavior During Therapy: WFL for tasks assessed/performed Overall Cognitive Status: Impaired/Different from baseline Area of Impairment: Orientation Orientation Level: Disoriented to;Time                 General Comments       Exercises       Shoulder Instructions      Home Living Family/patient expects to be discharged to:: Private residence  Living Arrangements: Spouse/significant other Available Help at Discharge: Family;Available 24 hours/day Type of Home: House Home Access: Level entry     Home Layout: One level     Bathroom  Shower/Tub: Producer, television/film/video: Standard     Home Equipment: Psychologist, educational - 2 wheels;Cane - single point          Prior Functioning/Environment Level of Independence: Independent        Comments: drives    OT Diagnosis: Generalized weakness;Cognitive deficits   OT Problem List: Decreased strength;Decreased activity tolerance;Impaired balance (sitting and/or standing);Decreased cognition;Decreased safety awareness;Decreased knowledge of use of DME or AE;Cardiopulmonary status limiting activity;Decreased knowledge of precautions   OT Treatment/Interventions: Self-care/ADL training;Energy conservation;DME and/or AE instruction;Therapeutic activities;Patient/family education;Balance training;Cognitive remediation/compensation    OT Goals(Current goals can be found in the care plan section) Acute Rehab OT Goals Patient Stated Goal: return home with wife OT Goal Formulation: With patient Time For Goal Achievement: 03/13/16 Potential to Achieve Goals: Good ADL Goals Pt Will Perform Grooming: with supervision;standing Pt Will Perform Lower Body Bathing: with supervision;sit to/from stand Pt Will Perform Lower Body Dressing: with supervision;sit to/from stand Pt Will Transfer to Toilet: with supervision;ambulating;bedside commode (over toilet) Pt Will Perform Toileting - Clothing Manipulation and hygiene: with supervision;sit to/from stand Pt Will Perform Tub/Shower Transfer: Shower transfer;with supervision;rolling walker;3 in 1;ambulating Additional ADL Goal #1: Pt will adhere to sternal precautions during ADL and mobility independently. Additional ADL Goal #2: Pt will employ energy conservation strategies during ADL and mobility with minimal verbal cues.  OT Frequency: Min 2X/week   Barriers to D/C:            Co-evaluation   Reason for Co-Treatment: Complexity of the patient's impairments (multi-system involvement);For patient/therapist safety           End of Session Equipment Utilized During Treatment: Gait belt;Rolling walker;Oxygen (2L) Nurse Communication: Mobility status  Activity Tolerance: Patient tolerated treatment well Patient left: in bed;with call bell/phone within reach;with nursing/sitter in room   Time: 0929-1000 OT Time Calculation (min): 31 min Charges:  OT General Charges $OT Visit: 1 Procedure OT Evaluation $OT Eval High Complexity: 1 Procedure G-Codes:    Evern Bio 03/13/2016, 1:25 PM  [phone]

## 2016-03-14 ENCOUNTER — Inpatient Hospital Stay (HOSPITAL_COMMUNITY): Payer: Commercial Managed Care - HMO

## 2016-03-14 LAB — GLUCOSE, CAPILLARY
GLUCOSE-CAPILLARY: 103 mg/dL — AB (ref 65–99)
GLUCOSE-CAPILLARY: 138 mg/dL — AB (ref 65–99)
GLUCOSE-CAPILLARY: 169 mg/dL — AB (ref 65–99)
Glucose-Capillary: 109 mg/dL — ABNORMAL HIGH (ref 65–99)
Glucose-Capillary: 115 mg/dL — ABNORMAL HIGH (ref 65–99)
Glucose-Capillary: 117 mg/dL — ABNORMAL HIGH (ref 65–99)

## 2016-03-14 LAB — CBC
HCT: 32.7 % — ABNORMAL LOW (ref 39.0–52.0)
Hemoglobin: 10.9 g/dL — ABNORMAL LOW (ref 13.0–17.0)
MCH: 29.6 pg (ref 26.0–34.0)
MCHC: 33.3 g/dL (ref 30.0–36.0)
MCV: 88.9 fL (ref 78.0–100.0)
Platelets: 115 10*3/uL — ABNORMAL LOW (ref 150–400)
RBC: 3.68 MIL/uL — ABNORMAL LOW (ref 4.22–5.81)
RDW: 16.8 % — ABNORMAL HIGH (ref 11.5–15.5)
WBC: 14.6 10*3/uL — ABNORMAL HIGH (ref 4.0–10.5)

## 2016-03-14 LAB — TYPE AND SCREEN
ABO/RH(D): A POS
Antibody Screen: NEGATIVE
Unit division: 0
Unit division: 0

## 2016-03-14 LAB — BASIC METABOLIC PANEL
Anion gap: 9 (ref 5–15)
BUN: 40 mg/dL — ABNORMAL HIGH (ref 6–20)
CO2: 26 mmol/L (ref 22–32)
Calcium: 9.2 mg/dL (ref 8.9–10.3)
Chloride: 103 mmol/L (ref 101–111)
Creatinine, Ser: 1.73 mg/dL — ABNORMAL HIGH (ref 0.61–1.24)
GFR calc Af Amer: 43 mL/min — ABNORMAL LOW (ref >60)
GFR calc non Af Amer: 37 mL/min — ABNORMAL LOW (ref >60)
Glucose, Bld: 109 mg/dL — ABNORMAL HIGH (ref 65–99)
Potassium: 4.3 mmol/L (ref 3.5–5.1)
Sodium: 138 mmol/L (ref 135–145)

## 2016-03-14 NOTE — Progress Notes (Signed)
4 Days Post-Op Procedure(s) (LRB): CORONARY ARTERY BYPASS GRAFTING (CABG)TIMES 4 USING LEFT INTERNAL MAMMARY AND BILATERAL GREATER SAPHENOUS VEIN HARVESTED BY ENDOVIEN (N/A) TRANSESOPHAGEAL ECHOCARDIOGRAM (TEE) (N/A) CLIPPING OF ATRIAL APPENDAGE (Left) LEFT ENDARTERECTOMY CAROTID DACRON PATCH ANGIOPLASTY (Left) Subjective: No complaints this AM  Objective: Vital signs in last 24 hours: Temp:  [97.7 F (36.5 C)-98.8 F (37.1 C)] 97.7 F (36.5 C) (04/01 0844) Pulse Rate:  [47-165] 86 (04/01 0900) Cardiac Rhythm:  [-] Atrial fibrillation (04/01 0800) Resp:  [12-26] 18 (04/01 0900) BP: (93-162)/(59-136) 106/76 mmHg (04/01 0900) SpO2:  [92 %-100 %] 96 % (04/01 0900) Weight:  [164 lb 7.4 oz (74.6 kg)] 164 lb 7.4 oz (74.6 kg) (04/01 0500)  Hemodynamic parameters for last 24 hours:    Intake/Output from previous day: 03/31 0701 - 04/01 0700 In: 1628 [P.O.:240; I.V.:556; Blood:632; IV Piggyback:200] Out: 3355 [Urine:2655; Chest Tube:700] Intake/Output this shift: Total I/O In: 40 [I.V.:40] Out: 350 [Urine:200; Chest Tube:150]  General appearance: alert and cooperative Neurologic: intact and generalized weakness Heart: irregularly irregular rhythm Lungs: rales bibasilar Wound: minimal drainage from sternal incision  Lab Results:  Recent Labs  03/13/16 0450  03/13/16 2221 03/14/16 0509  WBC 19.8*  --   --  14.6*  HGB 8.2*  < > 10.5* 10.9*  HCT 23.8*  < > 31.0* 32.7*  PLT 100*  --   --  115*  < > = values in this interval not displayed. BMET:  Recent Labs  03/13/16 0450 03/13/16 1602 03/13/16 2221 03/14/16 0509  NA 138 141 137 138  K 4.1 2.9* 4.1 4.3  CL 106 105  --  103  CO2 25  --   --  26  GLUCOSE 121* 102* 114* 109*  BUN 42* 30*  --  40*  CREATININE 2.61* 1.50*  --  1.73*  CALCIUM 9.1  --   --  9.2    PT/INR: No results for input(s): LABPROT, INR in the last 72 hours. ABG    Component Value Date/Time   PHART 7.356 03/12/2016 0400   HCO3 24.7* 03/12/2016  1622   TCO2 20 03/13/2016 1602   ACIDBASEDEF 2.0 03/12/2016 1622   O2SAT 63.4 03/13/2016 0449   CBG (last 3)   Recent Labs  03/13/16 2337 03/14/16 0348 03/14/16 0834  GLUCAP 115* 109* 117*    Assessment/Plan: S/P Procedure(s) (LRB): CORONARY ARTERY BYPASS GRAFTING (CABG)TIMES 4 USING LEFT INTERNAL MAMMARY AND BILATERAL GREATER SAPHENOUS VEIN HARVESTED BY ENDOVIEN (N/A) TRANSESOPHAGEAL ECHOCARDIOGRAM (TEE) (N/A) CLIPPING OF ATRIAL APPENDAGE (Left) LEFT ENDARTERECTOMY CAROTID DACRON PATCH ANGIOPLASTY (Left) -  CV- stable, in chronic atrial fibrillation on metoprolol and amiodarone  RESP- CT in place, no air leak- small pneumothorax- still draining too much to remove  RENAL- creatinine 1.73 today, dopamine off- follow  Continue diuresis  hypokalemia improved  DECONDITIONING- severe, continue to mobilize  Anemia better post transfusion   LOS: 12 days    Loreli SlotSteven C Hendrickson 03/14/2016

## 2016-03-14 NOTE — Progress Notes (Signed)
      [address]       [place] [id number]             [phone]      Requiring less assistance to mobilize  BP 113/78 mmHg  Pulse 82  Temp(Src) 97.6 F (36.4 C) (Oral)  Resp 21  Ht 5\' 2"  (1.575 m)  Wt 164 lb 7.4 oz (74.6 kg)  BMI 30.07 kg/m2  SpO2 93%   Intake/Output Summary (Last 24 hours) at 03/14/16 1824 Last data filed at 03/14/16 1700  Gross per 24 hour  Intake 1891.7 ml  Output   2145 ml  Net -253.3 ml    Continue current care  Shirin Echeverry C. Dorris FetchHendrickson, MD [facility] [phone]

## 2016-03-15 ENCOUNTER — Inpatient Hospital Stay (HOSPITAL_COMMUNITY): Payer: Commercial Managed Care - HMO

## 2016-03-15 LAB — GLUCOSE, CAPILLARY
GLUCOSE-CAPILLARY: 102 mg/dL — AB (ref 65–99)
GLUCOSE-CAPILLARY: 117 mg/dL — AB (ref 65–99)
GLUCOSE-CAPILLARY: 173 mg/dL — AB (ref 65–99)
Glucose-Capillary: 86 mg/dL (ref 65–99)
Glucose-Capillary: 109 mg/dL — ABNORMAL HIGH (ref 65–99)
Glucose-Capillary: 125 mg/dL — ABNORMAL HIGH (ref 65–99)

## 2016-03-15 LAB — BASIC METABOLIC PANEL
Anion gap: 9 (ref 5–15)
BUN: 43 mg/dL — ABNORMAL HIGH (ref 6–20)
CO2: 26 mmol/L (ref 22–32)
Calcium: 9.2 mg/dL (ref 8.9–10.3)
Chloride: 102 mmol/L (ref 101–111)
Creatinine, Ser: 1.74 mg/dL — ABNORMAL HIGH (ref 0.61–1.24)
GFR calc Af Amer: 42 mL/min — ABNORMAL LOW (ref >60)
GFR calc non Af Amer: 37 mL/min — ABNORMAL LOW (ref >60)
Glucose, Bld: 105 mg/dL — ABNORMAL HIGH (ref 65–99)
Potassium: 4 mmol/L (ref 3.5–5.1)
Sodium: 137 mmol/L (ref 135–145)

## 2016-03-15 LAB — CBC
HCT: 35.4 % — ABNORMAL LOW (ref 39.0–52.0)
Hemoglobin: 11.6 g/dL — ABNORMAL LOW (ref 13.0–17.0)
MCH: 29.4 pg (ref 26.0–34.0)
MCHC: 32.8 g/dL (ref 30.0–36.0)
MCV: 89.8 fL (ref 78.0–100.0)
Platelets: 157 10*3/uL (ref 150–400)
RBC: 3.94 MIL/uL — ABNORMAL LOW (ref 4.22–5.81)
RDW: 16.3 % — ABNORMAL HIGH (ref 11.5–15.5)
WBC: 17.7 10*3/uL — ABNORMAL HIGH (ref 4.0–10.5)

## 2016-03-15 MED ORDER — LEVALBUTEROL HCL 1.25 MG/0.5ML IN NEBU: 1.2500 mg | INHALATION_SOLUTION | Freq: Four times a day (QID) | RESPIRATORY_TRACT | Status: DC | PRN

## 2016-03-15 MED ORDER — INSULIN ASPART 100 UNIT/ML ~~LOC~~ SOLN
0.0000 [IU] | Freq: Three times a day (TID) | SUBCUTANEOUS | Status: DC
  Administered 2016-03-15: 1 [IU] via SUBCUTANEOUS

## 2016-03-15 NOTE — Progress Notes (Signed)
5 Days Post-Op Procedure(s) (LRB): CORONARY ARTERY BYPASS GRAFTING (CABG)TIMES 4 USING LEFT INTERNAL MAMMARY AND BILATERAL GREATER SAPHENOUS VEIN HARVESTED BY ENDOVIEN (N/A) TRANSESOPHAGEAL ECHOCARDIOGRAM (TEE) (N/A) CLIPPING OF ATRIAL APPENDAGE (Left) LEFT ENDARTERECTOMY CAROTID DACRON PATCH ANGIOPLASTY (Left) Subjective: Feels better today  Objective: Vital signs in last 24 hours: Temp:  [97.3 F (36.3 C)-98.6 F (37 C)] 98.6 F (37 C) (04/02 0759) Pulse Rate:  [30-104] 83 (04/02 0700) Cardiac Rhythm:  [-] Atrial fibrillation (04/02 0700) Resp:  [15-25] 21 (04/02 0700) BP: (96-155)/(61-98) 136/79 mmHg (04/02 0700) SpO2:  [93 %-100 %] 98 % (04/02 0847) Weight:  [161 lb 2.5 oz (73.1 kg)] 161 lb 2.5 oz (73.1 kg) (04/02 0500)  Hemodynamic parameters for last 24 hours:    Intake/Output from previous day: 04/01 0701 - 04/02 0700 In: 1160 [P.O.:720; I.V.:440] Out: 1820 [Urine:1320; Chest Tube:500] Intake/Output this shift:    General appearance: alert, cooperative and no distress Neurologic: intact Heart: irregularly irregular rhythm Lungs: diminished breath sounds bibasilar Abdomen: normal findings: soft, non-tender Wound: minimal serous drainage  Lab Results:  Recent Labs  03/14/16 0509 03/15/16 0410  WBC 14.6* 17.7*  HGB 10.9* 11.6*  HCT 32.7* 35.4*  PLT 115* 157   BMET:  Recent Labs  03/14/16 0509 03/15/16 0410  NA 138 137  K 4.3 4.0  CL 103 102  CO2 26 26  GLUCOSE 109* 105*  BUN 40* 43*  CREATININE 1.73* 1.74*  CALCIUM 9.2 9.2    PT/INR: No results for input(s): LABPROT, INR in the last 72 hours. ABG    Component Value Date/Time   PHART 7.356 03/12/2016 0400   HCO3 24.7* 03/12/2016 1622   TCO2 20 03/13/2016 1602   ACIDBASEDEF 2.0 03/12/2016 1622   O2SAT 63.4 03/13/2016 0449   CBG (last 3)   Recent Labs  03/14/16 2359 03/15/16 0406 03/15/16 0753  GLUCAP 102* 117* 86    Assessment/Plan: S/P Procedure(s) (LRB): CORONARY ARTERY BYPASS  GRAFTING (CABG)TIMES 4 USING LEFT INTERNAL MAMMARY AND BILATERAL GREATER SAPHENOUS VEIN HARVESTED BY ENDOVIEN (N/A) TRANSESOPHAGEAL ECHOCARDIOGRAM (TEE) (N/A) CLIPPING OF ATRIAL APPENDAGE (Left) LEFT ENDARTERECTOMY CAROTID DACRON PATCH ANGIOPLASTY (Left) -  CV- in atrial fib with rate controlled on amidarone  LAA clipped at surgery, on enoxaparin not felt to be a candidate for coumadin  RESP- continue IS  CT drained 500 ml yesterday- keep in place  RENAL- creatinine stable off dopamine  Keep Foley while IV diuresis  ENDO- CBg well controlled- change to AC/HS  SCD + enoxaparin for DVT prophylaxis   LOS: 13 days    [name] SlotSteven C Zaela Graley 03/15/2016

## 2016-03-15 NOTE — Progress Notes (Signed)
      [address]       [place] [id number]             [phone]      No new issues today  BP 135/90 mmHg  Pulse 87  Temp(Src) 98.1 F (36.7 C) (Oral)  Resp 20  Ht 5\' 2"  (1.575 m)  Wt 161 lb 2.5 oz (73.1 kg)  BMI 29.47 kg/m2  SpO2 97%   Intake/Output Summary (Last 24 hours) at 03/15/16 1810 Last data filed at 03/15/16 1700  Gross per 24 hour  Intake   1060 ml  Output   1590 ml  Net   -530 ml    Continue current care  Steven C. Dorris FetchHendrickson, MD [facility] [phone]

## 2016-03-16 ENCOUNTER — Telehealth: Payer: Self-pay | Admitting: Vascular Surgery

## 2016-03-16 ENCOUNTER — Inpatient Hospital Stay (HOSPITAL_COMMUNITY): Payer: Commercial Managed Care - HMO

## 2016-03-16 LAB — GLUCOSE, CAPILLARY
GLUCOSE-CAPILLARY: 109 mg/dL — AB (ref 65–99)
GLUCOSE-CAPILLARY: 121 mg/dL — AB (ref 65–99)
GLUCOSE-CAPILLARY: 98 mg/dL (ref 65–99)
Glucose-Capillary: 109 mg/dL — ABNORMAL HIGH (ref 65–99)
Glucose-Capillary: 103 mg/dL — ABNORMAL HIGH (ref 65–99)

## 2016-03-16 LAB — CBC
HCT: 35.2 % — ABNORMAL LOW (ref 39.0–52.0)
Hemoglobin: 11.7 g/dL — ABNORMAL LOW (ref 13.0–17.0)
MCH: 30.1 pg (ref 26.0–34.0)
MCHC: 33.2 g/dL (ref 30.0–36.0)
MCV: 90.5 fL (ref 78.0–100.0)
Platelets: 187 10*3/uL (ref 150–400)
RBC: 3.89 MIL/uL — ABNORMAL LOW (ref 4.22–5.81)
RDW: 15.8 % — ABNORMAL HIGH (ref 11.5–15.5)
WBC: 17.6 10*3/uL — ABNORMAL HIGH (ref 4.0–10.5)

## 2016-03-16 LAB — BASIC METABOLIC PANEL
Anion gap: 10 (ref 5–15)
BUN: 42 mg/dL — ABNORMAL HIGH (ref 6–20)
CO2: 21 mmol/L — ABNORMAL LOW (ref 22–32)
Calcium: 9.1 mg/dL (ref 8.9–10.3)
Chloride: 105 mmol/L (ref 101–111)
Creatinine, Ser: 1.45 mg/dL — ABNORMAL HIGH (ref 0.61–1.24)
GFR calc Af Amer: 53 mL/min — ABNORMAL LOW (ref >60)
GFR calc non Af Amer: 46 mL/min — ABNORMAL LOW (ref >60)
Glucose, Bld: 106 mg/dL — ABNORMAL HIGH (ref 65–99)
Potassium: 3.9 mmol/L (ref 3.5–5.1)
Sodium: 136 mmol/L (ref 135–145)

## 2016-03-16 MED ORDER — METOPROLOL TARTRATE 12.5 MG HALF TABLET
12.5000 mg | ORAL_TABLET | Freq: Two times a day (BID) | ORAL | Status: DC
  Administered 2016-03-16 (×2): 12.5 mg via ORAL
  Filled 2016-03-16 (×2): qty 1

## 2016-03-16 MED ORDER — AMIODARONE HCL 200 MG PO TABS
200.0000 mg | ORAL_TABLET | Freq: Two times a day (BID) | ORAL | Status: DC
  Administered 2016-03-16 – 2016-03-20 (×9): 200 mg via ORAL
  Filled 2016-03-16 (×10): qty 1

## 2016-03-16 MED ORDER — FUROSEMIDE 10 MG/ML IJ SOLN
40.0000 mg | Freq: Once | INTRAMUSCULAR | Status: AC
  Administered 2016-03-16: 40 mg via INTRAVENOUS
  Filled 2016-03-16: qty 4

## 2016-03-16 MED ORDER — FUROSEMIDE 40 MG PO TABS
40.0000 mg | ORAL_TABLET | Freq: Every day | ORAL | Status: DC
Start: 2016-03-17 — End: 2016-03-18
  Administered 2016-03-17 – 2016-03-18 (×2): 40 mg via ORAL
  Filled 2016-03-16 (×2): qty 1

## 2016-03-16 NOTE — Telephone Encounter (Signed)
I sched appt for 04/02/16 at 10:15.  The pt did not have a voicemail so I mailed an appt letter through regular mail to inform pt of appt.

## 2016-03-16 NOTE — Progress Notes (Signed)
6 Days Post-Op Procedure(s) (LRB): CORONARY ARTERY BYPASS GRAFTING (CABG)TIMES 4 USING LEFT INTERNAL MAMMARY AND BILATERAL GREATER SAPHENOUS VEIN HARVESTED BY ENDOVIEN (N/A) TRANSESOPHAGEAL ECHOCARDIOGRAM (TEE) (N/A) CLIPPING OF ATRIAL APPENDAGE (Left) LEFT ENDARTERECTOMY CAROTID DACRON PATCH ANGIOPLASTY (Left) Subjective: Improving status post CABG Preoperative MI, CHF, EF 30%, preoperative elevated creatinine. Postop acute on chronic renal failure now improving History of chronic atrial fibrillation Postop persistent drainage from left pleural chest tube-500 mL's, chest tube to remain today Ambulating in hallway Controlled atrial fibrillation Patient poor candidate for Coumadin because of noncompliance with medicines, continue low-dose Lovenox postop Plan transfer to stepdown  Objective: Vital signs in last 24 hours: Temp:  [97.7 F (36.5 C)-98.1 F (36.7 C)] 97.7 F (36.5 C) (04/03 0335) Pulse Rate:  [83-107] 107 (04/03 0700) Cardiac Rhythm:  [-] Atrial fibrillation (04/02 2000) Resp:  [12-24] 24 (04/03 0700) BP: (113-152)/(64-91) 151/81 mmHg (04/03 0700) SpO2:  [88 %-100 %] 100 % (04/03 0700) Weight:  [164 lb 10.9 oz (74.7 kg)] 164 lb 10.9 oz (74.7 kg) (04/03 0600)  Hemodynamic parameters for last 24 hours:   stable blood pressure, stable rate controlled atrial fibrillation  Intake/Output from previous day: 04/02 0701 - 04/03 0700 In: 880 [P.O.:780; I.V.:100] Out: 1875 [Urine:1175; Chest Tube:700] Intake/Output this shift:          Exam    General- alert and comfortable   Lungs- clear without rales, wheezes   Cor- regular rate and rhythm, no murmur , gallop   Abdomen- soft, non-tender   Extremities - warm, non-tender, minimal edema   Neuro- oriented, appropriate, no focal weakness   Lab Results:  Recent Labs  03/15/16 0410 03/16/16 0213  WBC 17.7* 17.6*  HGB 11.6* 11.7*  HCT 35.4* 35.2*  PLT 157 187   BMET:  Recent Labs  03/15/16 0410 03/16/16 0213   NA 137 136  K 4.0 3.9  CL 102 105  CO2 26 21*  GLUCOSE 105* 106*  BUN 43* 42*  CREATININE 1.74* 1.45*  CALCIUM 9.2 9.1    PT/INR: No results for input(s): LABPROT, INR in the last 72 hours. ABG    Component Value Date/Time   PHART 7.356 03/12/2016 0400   HCO3 24.7* 03/12/2016 1622   TCO2 20 03/13/2016 1602   ACIDBASEDEF 2.0 03/12/2016 1622   O2SAT 63.4 03/13/2016 0449   CBG (last 3)   Recent Labs  03/15/16 1542 03/15/16 1952 03/15/16 2340  GLUCAP 125* 173* 109*    Assessment/Plan: S/P Procedure(s) (LRB): CORONARY ARTERY BYPASS GRAFTING (CABG)TIMES 4 USING LEFT INTERNAL MAMMARY AND BILATERAL GREATER SAPHENOUS VEIN HARVESTED BY ENDOVIEN (N/A) TRANSESOPHAGEAL ECHOCARDIOGRAM (TEE) (N/A) CLIPPING OF ATRIAL APPENDAGE (Left) LEFT ENDARTERECTOMY CAROTID DACRON PATCH ANGIOPLASTY (Left) Mobilize Diuresis Plan for transfer to step-down: see transfer orders No Coumadin planned-left atrial appendage clipped   LOS: 14 days    Kathlee Nationseter Van Trigt III 03/16/2016

## 2016-03-16 NOTE — Telephone Encounter (Signed)
-----   Message from Sharee PimpleMarilyn K McChesney, RN sent at 03/16/2016 11:00 AM EDT ----- Regarding: schedule   ----- Message -----    From: Sherren Kernsharles E Fields, MD    Sent: 03/16/2016  10:36 AM      To: [facility]vs Charge Pool  PT needs follow up appt in 2 weeks  Fabienne Brunsharles Fields

## 2016-03-16 NOTE — Progress Notes (Signed)
Occupational Therapy Treatment Patient Details Name: [name] [name] MRN: [id number] DOB: Jan 22, 1941 Today's Date: 03/16/2016    History of present illness Patient is a 75 y/o male who does not regularly seek medical care who presents with SOB and was found to have acute systolic and diastolic heart failure. Pt s/p CABG, CLIPPING OF ATRIAL APPENDAGE and LEFT ENDARTERECTOMY CAROTID    OT comments  Wife participating in session. Educated pt and wife in sternal precautions and pacing for energy conservation. Pt able to don socks. Continues to have difficulty generalizing sternal precautions during mobility and demonstrates decreased standing balance. Will continue to follow.  Follow Up Recommendations  Supervision/Assistance - 24 hour;Home health OT    Equipment Recommendations  3 in 1 bedside comode    Recommendations for Other Services      Precautions / Restrictions Precautions Precautions: Fall;Sternal Precaution Comments: Reviewed sternal precautions with wife/pt Restrictions Weight Bearing Restrictions: Yes Other Position/Activity Restrictions: sternal precautions       Mobility Bed Mobility Overal bed mobility: Needs Assistance Bed Mobility: Sit to Sidelying;Rolling Rolling: Min guard       Sit to sidelying: Mod assist General bed mobility comments: Assist to bring bLEs into bed. Pt holding heart pillow.  Transfers Overall transfer level: Needs assistance Equipment used: Rolling walker (2 wheeled) Transfers: Sit to/from [employer] Sit to Stand: Min assist Stand pivot transfers: Min assist       General transfer comment: Min A to boost from chair with constant manual/verbal cues to not push through BUEs- holding heart pillow. Posterior LOB upon standing. SPT bed to chair Min A for support.     Balance Overall balance assessment: Needs assistance Sitting-balance support: Feet supported;No upper extremity supported Sitting balance-Leahy Scale:  Good Sitting balance - Comments: Able to reach outside BoS and doff/donn socks.   Standing balance support: During functional activity Standing balance-Leahy Scale: Fair Standing balance comment: Able to stand statically for short periods without UE support but imbalance noted.                    ADL Overall ADL's : Needs assistance/impaired     Grooming: Brushing hair;Sitting;Set up           Upper Body Dressing : Minimal assistance;Sitting   Lower Body Dressing: Minimal assistance;Sit to/from stand;Set up Lower Body Dressing Details (indicate cue type and reason): set up for socks, min assist for pants for balance Toilet Transfer: Minimal assistance;RW;Ambulation;BSC Toilet Transfer Details (indicate cue type and reason): educated wife in use of 3 in 1 over toilet  Toileting- Clothing Manipulation and Hygiene: Minimal assistance       Functional mobility during ADLs: Minimal assistance;Rolling walker General ADL Comments: instructed pt in safety and pacing for energy conservation      Vision                     Perception     Praxis      Cognition   Behavior During Therapy: [facility] for tasks assessed/performed Overall Cognitive Status: Within Functional Limits for tasks assessed                       Extremity/Trunk Assessment               Exercises     Shoulder Instructions       General Comments      Pertinent Vitals/ Pain       Pain Assessment: No/denies pain  Home Living                                          Prior Functioning/Environment              Frequency Min 2X/week     Progress Toward Goals  OT Goals(current goals can now be found in the care plan section)  Progress towards OT goals: Progressing toward goals  Acute Rehab OT Goals Patient Stated Goal: return home with wife  Plan Discharge plan remains appropriate    Co-evaluation    PT/OT/SLP Co-Evaluation/Treatment:  Yes Reason for Co-Treatment: For patient/therapist safety PT goals addressed during session: Mobility/safety with mobility        End of Session Equipment Utilized During Treatment: Gait belt;Rolling walker   Activity Tolerance Patient tolerated treatment well   Patient Left in bed;with call bell/phone within reach;with family/visitor present   Nurse Communication          Time: [id number] OT Time Calculation (min): 31 min  Charges: OT General Charges $OT Visit: 1 Procedure OT Treatments $Self Care/Home Management : 8-22 mins  Evern BioMayberry, Ardeth Repetto Lynn 03/16/2016, 11:47 AM  [phone]

## 2016-03-16 NOTE — Progress Notes (Signed)
Pt received from 2S.  Oriented to room, CCMD notified as well as dietary.  Pt reports no pain or distress at this time.  He is resting comfortably in bed.  Assessment consistent with that received from 2S RN.

## 2016-03-16 NOTE — Progress Notes (Signed)
Physical Therapy Treatment Patient Details Name: [name] [name] MRN: [id number] DOB: [birth year] Today's Date: 03/16/2016    History of Present Illness Patient is a 75 y/o male who does not regularly seek medical care who presents with SOB and was found to have acute systolic and diastolic heart failure. Pt s/p CABG, CLIPPING OF ATRIAL APPENDAGE and LEFT ENDARTERECTOMY CAROTID     PT Comments    Patient progressing well towards PT goals. Continues to require assist with standing and bed mobility. Difficulty with turns using RW requiring Min A to maintain balance. Pt with more steady pace today. Education performed with wife and pt together. Continues to require cues to adhere to sternal precautions during mobility. Will follow.   Follow Up Recommendations  Home health PT;Supervision/Assistance - 24 hour     Equipment Recommendations  Rolling walker with 5" wheels    Recommendations for Other Services       Precautions / Restrictions Precautions Precautions: Fall;Sternal Precaution Comments: Reviewed sternal precautions with wife/pt Restrictions Weight Bearing Restrictions: Yes Other Position/Activity Restrictions: sternal precautions    Mobility  Bed Mobility Overal bed mobility: Needs Assistance Bed Mobility: Sit to Sidelying;Rolling Rolling: Min guard       Sit to sidelying: Mod assist General bed mobility comments: Assist to bring bLEs into bed. Pt holding heart pillow.  Transfers Overall transfer level: Needs assistance Equipment used: Rolling walker (2 wheeled) Transfers: Sit to/from [employer] Sit to Stand: Min assist Stand pivot transfers: Min assist       General transfer comment: Min A to boost from chair with constant manual/verbal cues to not push through BUEs- holding heart pillow. Posterior LOB upon standing. SPT bed to chair Min A for support.   Ambulation/Gait Ambulation/Gait assistance: Min assist Ambulation Distance (Feet): 500  Feet Assistive device: Rolling walker (2 wheeled) Gait Pattern/deviations: Step-through pattern;Decreased stride length;Trunk flexed Gait velocity: decreased   General Gait Details: Slow, more steady gait. HR 90-125 bpm. SP02 >94% on RA. Better pace today. Difficulty with turns with LOB to the right.    Stairs            Wheelchair Mobility    Modified Rankin (Stroke Patients Only)       Balance Overall balance assessment: Needs assistance Sitting-balance support: Feet supported;No upper extremity supported Sitting balance-Leahy Scale: Good Sitting balance - Comments: Able to reach outside BoS and doff/donn socks.   Standing balance support: During functional activity Standing balance-Leahy Scale: Fair Standing balance comment: Able to stand statically for short periods without UE support but imbalance noted.                     Cognition Arousal/Alertness: Awake/alert Behavior During Therapy: WFL for tasks assessed/performed Overall Cognitive Status: Within Functional Limits for tasks assessed                      Exercises      General Comments General comments (skin integrity, edema, etc.): Wife present during session. Education re: sternal precautions, home environment recommendations, how to assist with mobility etc.       Pertinent Vitals/Pain Pain Assessment: No/denies pain    Home Living                      Prior Function            PT Goals (current goals can now be found in the care plan section) Progress towards PT goals: Progressing toward  goals    Frequency  Min 3X/week    PT Plan Current plan remains appropriate    Co-evaluation PT/OT/SLP Co-Evaluation/Treatment: Yes Reason for Co-Treatment: For patient/therapist safety PT goals addressed during session: Mobility/safety with mobility       End of Session Equipment Utilized During Treatment: Gait belt Activity Tolerance: Patient tolerated treatment  well Patient left: in bed;with family/visitor present;with call bell/phone within reach     Time: 1028-1059 PT Time Calculation (min) (ACUTE ONLY): 31 min  Charges:  $Gait Training: 8-22 mins                    G Codes:      Mekesha Solomon A Mert Dietrick 03/16/2016, 11:08 AM Mylo RedShauna Kavonte Bearse, PT, DPT [phone]

## 2016-03-16 NOTE — Care Management Important Message (Signed)
Important Message  Patient Details  Name: Danella Deisibor Tesmer MRN: [id number] Date of Birth: July 23, 1941   Medicare Important Message Given:  Yes    Kyla BalzarineShealy, Brihana Quickel [name] 03/16/2016, 12:05 PM

## 2016-03-16 NOTE — Progress Notes (Addendum)
[facility] Progress Note  Subjective  - POD #6  Feeling well today.   Objective Filed Vitals:   03/16/16 0813 03/16/16 0900  BP:  148/113  Pulse:  111  Temp: 97.9 F (36.6 C)   Resp:  23    Intake/Output Summary (Last 24 hours) at 03/16/16 1021 Last data filed at 03/16/16 0950  Gross per 24 hour  Intake    700 ml  Output   2190 ml  Net  -1490 ml    5/5 bilateral grip strength.  Left neck incision clean and intact. No hematoma.   Assessment/Planning: 75 y.o. male is s/p: combined left CEA and CABG 6 Days Post-Op   Upper extremity coordination and strength improving.  Needs to mobilize.  PT to work with patient later this morning.  To transfer out of ICU today.   Raymond GurneyKimberly A Trinh 03/16/2016 10:21 AM -- Agree with above.  Neuro intact.  Neck incision healing. Will arrange follow up with us in 2 weeks  Fabienne Brunsharles Kelii Chittum, MD [facility] [place] Office: [phone] Pager: [phone]  Laboratory CBC    Component Value Date/Time   WBC 17.6* 03/16/2016 0213   HGB 11.7* 03/16/2016 0213   HCT 35.2* 03/16/2016 0213   PLT 187 03/16/2016 0213    BMET    Component Value Date/Time   NA 136 03/16/2016 0213   K 3.9 03/16/2016 0213   CL 105 03/16/2016 0213   CO2 21* 03/16/2016 0213   GLUCOSE 106* 03/16/2016 0213   BUN 42* 03/16/2016 0213   CREATININE 1.45* 03/16/2016 0213   CALCIUM 9.1 03/16/2016 0213   GFRNONAA 46* 03/16/2016 0213   GFRAA 53* 03/16/2016 0213    COAG Lab Results  Component Value Date   INR 2.00* 03/10/2016   INR 1.09 03/04/2016   No results found for: PTT  Antibiotics Anti-infectives    Start     Dose/Rate Route Frequency Ordered Stop   03/10/16 2315  vancomycin (VANCOCIN) IVPB 1000 mg/200 mL premix  Status:  Discontinued     1,000 mg 200 mL/hr over 60 Minutes Intravenous  Once 03/10/16 1759 03/10/16 1839   03/10/16 2300  vancomycin (VANCOCIN) IVPB 1000 mg/200 mL premix     1,000 mg 200  mL/hr over 60 Minutes Intravenous Every 12 hours 03/10/16 1839 03/11/16 1259   03/10/16 1915  cefUROXime (ZINACEF) 1.5 g in dextrose 5 % 50 mL IVPB     1.5 g 100 mL/hr over 30 Minutes Intravenous Every 12 hours 03/10/16 1759 03/12/16 0635   03/10/16 0400  vancomycin (VANCOCIN) 1,250 mg in sodium chloride 0.9 % 250 mL IVPB  Status:  Discontinued     1,250 mg 166.7 mL/hr over 90 Minutes Intravenous To Surgery 03/09/16 1439 03/10/16 0753   03/10/16 0400  cefUROXime (ZINACEF) 1.5 g in dextrose 5 % 50 mL IVPB  Status:  Discontinued     1.5 g 100 mL/hr over 30 Minutes Intravenous To Surgery 03/09/16 1439 03/10/16 0753   03/10/16 0400  cefUROXime (ZINACEF) 750 mg in dextrose 5 % 50 mL IVPB  Status:  Discontinued     750 mg 100 mL/hr over 30 Minutes Intravenous To Surgery 03/09/16 1438 03/10/16 0753   03/05/16 0400  vancomycin (VANCOCIN) 1,250 mg in sodium chloride 0.9 % 250 mL IVPB  Status:  Discontinued     1,250 mg 166.7 mL/hr over 90 Minutes Intravenous To Surgery 03/04/16 1608 03/04/16 1908   03/05/16 0400  cefUROXime (ZINACEF) 1.5 g in dextrose  5 % 50 mL IVPB  Status:  Discontinued     1.5 g 100 mL/hr over 30 Minutes Intravenous To Surgery 03/04/16 1608 03/04/16 1908   03/05/16 0400  cefUROXime (ZINACEF) 750 mg in dextrose 5 % 50 mL IVPB  Status:  Discontinued     750 mg 100 mL/hr over 30 Minutes Intravenous To Surgery 03/04/16 1608 03/04/16 1908       Maris Berger, PA-C [facility] Office: [phone] Pager: [phone] 03/16/2016 10:21 AM

## 2016-03-16 NOTE — Progress Notes (Addendum)
RN went into patient's room, pt trying to get out of bed and stated he needed to use the bathroom. RN assisted pt to a standing position and pt felt steady on his feet.  While RN was swinging BSC around pt lost balance and fell backwards onto his buttocks. RN witnessed fall - pt did not hit his head or any extremities.  Pt is alert / oriented and answering questions appropriately.  VSS.  MD Tyrone SageGerhardt notified of event.  Attempted to call patient's wife.  Safety zone to be completed.  Pt now resting in bed, no complaints of pain, with bed alarm on and call bell within reach.  RN will continue to monitor and assess pt throughout shift.

## 2016-03-17 ENCOUNTER — Inpatient Hospital Stay (HOSPITAL_COMMUNITY): Payer: Commercial Managed Care - HMO

## 2016-03-17 LAB — CBC
HCT: 35.6 % — ABNORMAL LOW (ref 39.0–52.0)
Hemoglobin: 11.7 g/dL — ABNORMAL LOW (ref 13.0–17.0)
MCH: 29.8 pg (ref 26.0–34.0)
MCHC: 32.9 g/dL (ref 30.0–36.0)
MCV: 90.8 fL (ref 78.0–100.0)
Platelets: 263 10*3/uL (ref 150–400)
RBC: 3.92 MIL/uL — ABNORMAL LOW (ref 4.22–5.81)
RDW: 15.7 % — ABNORMAL HIGH (ref 11.5–15.5)
WBC: 16.6 10*3/uL — ABNORMAL HIGH (ref 4.0–10.5)

## 2016-03-17 LAB — BASIC METABOLIC PANEL
Anion gap: 12 (ref 5–15)
BUN: 37 mg/dL — ABNORMAL HIGH (ref 6–20)
CO2: 24 mmol/L (ref 22–32)
Calcium: 9.4 mg/dL (ref 8.9–10.3)
Chloride: 101 mmol/L (ref 101–111)
Creatinine, Ser: 1.34 mg/dL — ABNORMAL HIGH (ref 0.61–1.24)
GFR calc Af Amer: 58 mL/min — ABNORMAL LOW (ref >60)
GFR calc non Af Amer: 50 mL/min — ABNORMAL LOW (ref >60)
Glucose, Bld: 95 mg/dL (ref 65–99)
Potassium: 3.6 mmol/L (ref 3.5–5.1)
Sodium: 137 mmol/L (ref 135–145)

## 2016-03-17 LAB — GLUCOSE, CAPILLARY: GLUCOSE-CAPILLARY: 96 mg/dL (ref 65–99)

## 2016-03-17 MED ORDER — METOPROLOL TARTRATE 25 MG PO TABS
25.0000 mg | ORAL_TABLET | Freq: Two times a day (BID) | ORAL | Status: DC
  Administered 2016-03-17 – 2016-03-18 (×3): 25 mg via ORAL
  Filled 2016-03-17 (×3): qty 1

## 2016-03-17 NOTE — Progress Notes (Signed)
Removed Pt. wires at 1205 Right side wires has resistance, but wires intact patient says he had no pain and tolerated procedure well BP after removal was 100/78.

## 2016-03-17 NOTE — Progress Notes (Signed)
CARDIAC REHAB PHASE I   PRE:  Rate/Rhythm: 85 afib    BP: sitting 114/85    SaO2: 93-94 RA  MODE:  Ambulation: 350 ft   POST:  Rate/Rhythm: 96 afib    BP: sitting 91/78     SaO2: 98 RA  Pt struggled to get up out of bed (no pad on bed). Fairly steady walking with RW, no c/o. To BR then recliner. No urine output in BR. Scrotum and thighs very swollen and uncomfortable. Elevated scrotum. Pt has tendency to forget and push up on his arms. Discussed this. Will f/u tomorrow. [id number]   Harriet MassonRandi Kristan Sania Noy CES, ACSM 03/17/2016 3:43 PM

## 2016-03-17 NOTE — Progress Notes (Signed)
Speech Language Pathology Treatment: Dysphagia  Patient Details Name: [name] [name] MRN: [id number] DOB: [birth year] Today's Date: 03/17/2016 Time: 3354-5625 SLP Time Calculation (min) (ACUTE ONLY): 10 min  Assessment / Plan / Recommendation Clinical Impression  Skilled treatment session focused on addressing dysphagia goals for diet advancement.  Per chart review and patient report patient tolerating current Dys.3 textures and thin liquid diet.  Patient was observed while consuming regular textures and thin liquids via straw with efficient mastication and oral clearance as well as no overt s/s of pharyngeal dysphagia. Recommend diet advancement with no restrictions other then meds crushed in puree per patient preference. No further skilled SLP services warranted at this time; SLP signing off.     HPI HPI: Patient is a 75 y/o male who does not regularly seek medical care who presents with SOB and was found to have acute systolic and diastolic heart failure. Pt s/p CABG, CLIPPING OF ATRIAL APPENDAGE and left CEA.  Intubated 3/28-29.        SLP Plan  All goals met     Recommendations  Diet recommendations: Regular;Thin liquid Liquids provided via: Cup;Straw Medication Administration: Crushed with puree (per patient preference) Supervision: Patient able to self feed Compensations: Slow rate;Small sips/bites;Follow solids with liquid Postural Changes and/or Swallow Maneuvers: Out of bed for meals;Seated upright 90 degrees             Oral Care Recommendations: Oral care BID Follow up Recommendations: None Plan: All goals met     GO              Carmelia Roller., CCC-SLP [phone]   [name] [name] 03/17/2016, 10:38 AM

## 2016-03-17 NOTE — Progress Notes (Signed)
Physical Therapy Treatment Patient Details Name: [name] [name] MRN: [id number] DOB: April 04, 1941 Today's Date: 03/17/2016    History of Present Illness Patient is a 75 y/o male who does not regularly seek medical care who presents with SOB and was found to have acute systolic and diastolic heart failure. Pt s/p CABG, CLIPPING OF ATRIAL APPENDAGE and LEFT ENDARTERECTOMY CAROTID     PT Comments    Patient progressing slowly towards PT goals. Continues to require constant cues to adhere to sternal precautions during mobility. Able to verbalize them but has difficulty demonstrating them during activity. Continues to have imbalance in standing and at times during ambulation esp when distracted or with slow gait speed. Will follow acutely.   Follow Up Recommendations  Home health PT;Supervision/Assistance - 24 hour     Equipment Recommendations  Rolling walker with 5" wheels    Recommendations for Other Services       Precautions / Restrictions Precautions Precautions: Fall;Sternal Restrictions Weight Bearing Restrictions: Yes Other Position/Activity Restrictions: sternal precautions    Mobility  Bed Mobility Overal bed mobility: Needs Assistance Bed Mobility: Rolling;Sidelying to Sit Rolling: Min guard Sidelying to sit: Min assist;HOB elevated       General bed mobility comments: Assist to scoot bottom to EOB. Cues to hold pillow and not push through BUEs.  Transfers Overall transfer level: Needs assistance Equipment used: Rolling walker (2 wheeled) Transfers: Sit to/from Stand Sit to Stand: Min assist         General transfer comment: Min A to boost from EOB with constant cues to adhere to sternal precautions. Pt continually trying to put pillow down despite cues. posterior LOB upon standing. Stood from toilet x1, transferred to chair post ambulation bout.  Ambulation/Gait Ambulation/Gait assistance: Min assist Ambulation Distance (Feet): 150 Feet Assistive device:  Rolling walker (2 wheeled) Gait Pattern/deviations: Step-through pattern;Decreased stride length;Trunk flexed Gait velocity: decreased   General Gait Details: Slow, mostly steady gait however some mild imbalance during turns or when distracted. HR up to 120s.   Stairs            Wheelchair Mobility    Modified Rankin (Stroke Patients Only)       Balance Overall balance assessment: Needs assistance Sitting-balance support: Feet supported;No upper extremity supported Sitting balance-Leahy Scale: Good     Standing balance support: During functional activity Standing balance-Leahy Scale: Fair Standing balance comment: Posterior lean and LOB when standing at times. Requires UE support for balance.                     Cognition Arousal/Alertness: Awake/alert Behavior During Therapy: WFL for tasks assessed/performed Overall Cognitive Status: Within Functional Limits for tasks assessed                 General Comments: Some confusion noted at times, "yes no using UEs" and then pt goes to try to use UEs.    Exercises      General Comments        Pertinent Vitals/Pain Pain Assessment: No/denies pain    Home Living                      Prior Function            PT Goals (current goals can now be found in the care plan section) Progress towards PT goals: Progressing toward goals    Frequency  Min 3X/week    PT Plan Current plan remains appropriate    Co-evaluation  End of Session Equipment Utilized During Treatment: Gait belt Activity Tolerance: Patient tolerated treatment well Patient left: in chair;with call bell/phone within reach;with chair alarm set     Time: [phone] PT Time Calculation (min) (ACUTE ONLY): 26 min  Charges:  $Gait Training: 8-22 mins $Therapeutic Activity: 8-22 mins                    G Codes:      Ellamae Lybeck A Alexys Lobello 03/17/2016, 10:41 AM Mylo Red, PT, DPT [phone]

## 2016-03-17 NOTE — Progress Notes (Addendum)
      [address]       [place] [id number]             [phone]      7 Days Post-Op Procedure(s) (LRB): CORONARY ARTERY BYPASS GRAFTING (CABG)TIMES 4 USING LEFT INTERNAL MAMMARY AND BILATERAL GREATER SAPHENOUS VEIN HARVESTED BY ENDOVIEN (N/A) TRANSESOPHAGEAL ECHOCARDIOGRAM (TEE) (N/A) CLIPPING OF ATRIAL APPENDAGE (Left) LEFT ENDARTERECTOMY CAROTID DACRON PATCH ANGIOPLASTY (Left)   Subjective:  [name] has no complaints this morning.  He did fall early this morning after getting up on his own, stating he forgot he had a chest tube.  Patient encouraged to wait for nursing assistance to get out of bed.  + ambulation  + BM  Objective: Vital signs in last 24 hours: Temp:  [97.6 F (36.4 C)-98.4 F (36.9 C)] 98.4 F (36.9 C) (04/04 0450) Pulse Rate:  [74-111] 103 (04/04 0450) Cardiac Rhythm:  [-] Atrial fibrillation (04/03 2010) Resp:  [18-30] 18 (04/04 0450) BP: (105-148)/(76-113) 112/90 mmHg (04/04 0450) SpO2:  [93 %-100 %] 97 % (04/04 0450) Weight:  [163 lb 6.4 oz (74.118 kg)] 163 lb 6.4 oz (74.118 kg) (04/04 0450)  Intake/Output from previous day: 04/03 0701 - 04/04 0700 In: 462 [P.O.:462] Out: 1055 [Urine:625; Chest Tube:430]  General appearance: alert, cooperative and no distress Heart: irregularly irregular rhythm Lungs: clear to auscultation bilaterally Abdomen: soft, non-tender; bowel sounds normal; no masses,  no organomegaly Extremities: edema 1+ pitting, RLE ecchymotic Wound: clean and dry  Lab Results:  Recent Labs  03/16/16 0213 03/17/16 0257  WBC 17.6* 16.6*  HGB 11.7* 11.7*  HCT 35.2* 35.6*  PLT 187 263   BMET:  Recent Labs  03/16/16 0213 03/17/16 0257  NA 136 137  K 3.9 3.6  CL 105 101  CO2 21* 24  GLUCOSE 106* 95  BUN 42* 37*  CREATININE 1.45* 1.34*  CALCIUM 9.1 9.4    PT/INR: No results for input(s): LABPROT, INR in the last 72 hours. ABG    Component Value Date/Time   PHART 7.356 03/12/2016 0400   HCO3 24.7*  03/12/2016 1622   TCO2 20 03/13/2016 1602   ACIDBASEDEF 2.0 03/12/2016 1622   O2SAT 63.4 03/13/2016 0449   CBG (last 3)   Recent Labs  03/16/16 1634 03/16/16 2156 03/17/16 0620  GLUCAP 121* 98 96    Assessment/Plan: S/P Procedure(s) (LRB): CORONARY ARTERY BYPASS GRAFTING (CABG)TIMES 4 USING LEFT INTERNAL MAMMARY AND BILATERAL GREATER SAPHENOUS VEIN HARVESTED BY ENDOVIEN (N/A) TRANSESOPHAGEAL ECHOCARDIOGRAM (TEE) (N/A) CLIPPING OF ATRIAL APPENDAGE (Left) LEFT ENDARTERECTOMY CAROTID DACRON PATCH ANGIOPLASTY (Left)  1. CV- Chronic A. Fib rate in the low 100s, not a candidate for coumadin- continue Amiodarone, increase Lopressor to 25 mg BID 2. Pulm- off oxygen, continue IS, Chest tube output improving 180 cc yesterday, no significant pleural effusion 3. Renal- creatinine trending downward, currently at 1.34, remains hypervolemic, on Lasix 4. Expected post operative blood loss, mild 11.7 5. Cbgs, controlled will d/c SSIP 6. Dispo- patient stable, increase lopressor for A. Fib, will d/c EPW, likely continue chest tube today, diurese   LOS: 15 days    [name], [name] 03/17/2016  L effusion from poor nutrition, protein status Will DC L PT when output < 150 cc/day [facility]

## 2016-03-17 NOTE — Progress Notes (Signed)
Utilization review completed.  

## 2016-03-17 NOTE — Consult Note (Signed)
   [facility]   03/17/2016  Danella Deisibor Rodarte 1941/04/24 [id number]    [facility]. Went to bedside to offer and explain [facility] with patient. [facility] declined [facility] follow [facility]. Patient states he and his wife plan on patient having close follow up with his doctors post discharge. States he does not need additional support from [facility]. Accepted [facility] broch[facility] with contact information to call in future if changes mind. Made inpatient RNCM aware that patient [facility].  Raiford NobleAtika Eliany Mccarter, MSN-Ed, RN,BSN [facility] Hospital Liaison [phone]

## 2016-03-18 DIAGNOSIS — Z951 Presence of aortocoronary bypass graft: Secondary | ICD-10-CM

## 2016-03-18 DIAGNOSIS — N179 Acute kidney failure, unspecified: Secondary | ICD-10-CM

## 2016-03-18 LAB — URINALYSIS, ROUTINE W REFLEX MICROSCOPIC
BILIRUBIN URINE: NEGATIVE
GLUCOSE, UA: NEGATIVE mg/dL
Hgb urine dipstick: NEGATIVE
Ketones, ur: NEGATIVE mg/dL
Leukocytes, UA: NEGATIVE
NITRITE: NEGATIVE
PH: 5 (ref 5.0–8.0)
Protein, ur: NEGATIVE mg/dL
SPECIFIC GRAVITY, URINE: 1.009 (ref 1.005–1.030)

## 2016-03-18 MED ORDER — POTASSIUM CHLORIDE CRYS ER 20 MEQ PO TBCR: 20.0000 meq | EXTENDED_RELEASE_TABLET | Freq: Once | ORAL | Status: DC

## 2016-03-18 MED ORDER — POTASSIUM CHLORIDE CRYS ER 20 MEQ PO TBCR
20.0000 meq | EXTENDED_RELEASE_TABLET | Freq: Every day | ORAL | Status: DC
  Administered 2016-03-18 – 2016-03-19 (×2): 20 meq via ORAL
  Filled 2016-03-18 (×3): qty 1

## 2016-03-18 MED ORDER — POTASSIUM CHLORIDE CRYS ER 10 MEQ PO TBCR
EXTENDED_RELEASE_TABLET | ORAL | Status: AC
  Filled 2016-03-18: qty 2

## 2016-03-18 MED ORDER — CARVEDILOL 3.125 MG PO TABS
3.1250 mg | ORAL_TABLET | Freq: Two times a day (BID) | ORAL | Status: DC
  Administered 2016-03-18 – 2016-03-24 (×12): 3.125 mg via ORAL
  Filled 2016-03-18 (×12): qty 1

## 2016-03-18 MED ORDER — METOLAZONE 5 MG PO TABS
5.0000 mg | ORAL_TABLET | Freq: Every day | ORAL | Status: DC
  Administered 2016-03-18: 5 mg via ORAL
  Filled 2016-03-18: qty 1

## 2016-03-18 MED ORDER — SPIRONOLACTONE 25 MG PO TABS
25.0000 mg | ORAL_TABLET | Freq: Every day | ORAL | Status: DC
  Administered 2016-03-18 – 2016-03-21 (×4): 25 mg via ORAL
  Filled 2016-03-18 (×4): qty 1

## 2016-03-18 MED ORDER — ENSURE ENLIVE PO LIQD
237.0000 mL | Freq: Two times a day (BID) | ORAL | Status: DC
  Administered 2016-03-18 – 2016-03-24 (×9): 237 mL via ORAL

## 2016-03-18 MED ORDER — FUROSEMIDE 10 MG/ML IJ SOLN
80.0000 mg | Freq: Two times a day (BID) | INTRAMUSCULAR | Status: DC
  Administered 2016-03-18 – 2016-03-19 (×3): 80 mg via INTRAVENOUS
  Filled 2016-03-18 (×4): qty 8

## 2016-03-18 NOTE — Progress Notes (Signed)
Attempted to place 6714 french cath, but failed due to cath coiling from restriction. [facility] and have new [facility] for [facility].

## 2016-03-18 NOTE — Progress Notes (Signed)
CARDIAC REHAB PHASE I   PRE:  Rate/Rhythm: 102 a. fib  BP:  Sitting: 94/78        SaO2: 96 RA  MODE:  Ambulation: 350 ft   POST:  Rate/Rhythm: 107 a.fib  BP:  Sitting: 120/96         SaO2: 95 RA  Pt up in chair, agreeable to walk. Pt stood independently, however, did lean backwards twice upon standing, unsteady, gait belt applied. Pt ambulated 350 ft on RA, rolling walker, gait belt, slow, fairly steady gait, tolerated well, no complaints. Pt to recliner after walk, feet elevated, call bell within reach. Will follow.   [id number] Joylene GrapesEmily C Zaven Klemens, RN, BSN 03/18/2016 10:46 AM

## 2016-03-18 NOTE — Progress Notes (Signed)
Occupational Therapy Treatment Patient Details Name: Lorimer Tiberio MRN: [id number] DOB: Sep 07, 1941 Today's Date: 03/18/2016    History of present illness Patient is a 75 y/o male who does not regularly seek medical care who presents with SOB and was found to have acute systolic and diastolic heart failure. Pt s/p CABG, CLIPPING OF ATRIAL APPENDAGE and LEFT ENDARTERECTOMY CAROTID    OT comments  Pt performed standing grooming x 10 minutes with supervision. Min assist for toileting and UB bathing.  Pt needing cues to adhere to sternal precautions with sit<>stand, but no physical assist from chair. Progressing well.  Follow Up Recommendations  Supervision/Assistance - 24 hour;Home health OT    Equipment Recommendations  3 in 1 bedside comode    Recommendations for Other Services      Precautions / Restrictions Precautions Precautions: Fall;Sternal Precaution Comments: needs cues for generalizing sternal precautions Restrictions Other Position/Activity Restrictions: sternal precautions chest tube      Mobility Bed Mobility               General bed mobility comments: pt in chair  Transfers Overall transfer level: Needs assistance Equipment used: Rolling walker (2 wheeled) Transfers: Sit to/from Stand Sit to Stand: Min guard         General transfer comment: no physical assist, cues for technique    Balance                                   ADL Overall ADL's : Needs assistance/impaired     Grooming: Wash/dry hands;Wash/dry face;Oral care;Brushing hair;Sitting;Standing;Supervision/safety   Upper Body Bathing: Minimal assitance;Sitting Upper Body Bathing Details (indicate cue type and reason): assist for back and to apply lotion to back     Upper Body Dressing : Set up;Sitting       Toilet Transfer: Min guard;RW;Ambulation;Comfort height toilet   Toileting- Clothing Manipulation and Hygiene: Minimal assistance       Functional mobility  during ADLs: Min guard;Rolling walker General ADL Comments: stood x 10 minutes at sink      Emergency planning/management officer   Behavior During Therapy: [facility] for tasks assessed/performed Overall Cognitive Status: Within Functional Limits for tasks assessed                       Extremity/Trunk Assessment               Exercises     Shoulder Instructions       General Comments      Pertinent Vitals/ Pain       Pain Assessment: No/denies pain  Home Living                                          Prior Functioning/Environment              Frequency Min 2X/week     Progress Toward Goals  OT Goals(current goals can now be found in the care plan section)  Progress towards OT goals: Progressing toward goals  Acute Rehab OT Goals Patient Stated Goal: return home with wife Time For Goal Achievement: 03/25/16 Potential to Achieve Goals: Good  Plan  Discharge plan remains appropriate    Co-evaluation                 End of Session Equipment Utilized During Treatment: Gait belt;Rolling walker   Activity Tolerance Patient tolerated treatment well   Patient Left in chair;with call bell/phone within reach;with chair alarm set   Nurse Communication          Time: [phone] OT Time Calculation (min): 27 min  Charges: OT General Charges $OT Visit: 1 Procedure OT Treatments $Self Care/Home Management : 23-37 mins  Evern BioMayberry, Joshiah Traynham Lynn 03/18/2016, 8:56 AM  [phone]

## 2016-03-18 NOTE — Progress Notes (Addendum)
[address]       [place] [address]             [phone]      8 Days Post-Op Procedure(s) (LRB): CORONARY ARTERY BYPASS GRAFTING (CABG)TIMES 4 USING LEFT INTERNAL MAMMARY AND BILATERAL GREATER SAPHENOUS VEIN HARVESTED BY ENDOVIEN (N/A) TRANSESOPHAGEAL ECHOCARDIOGRAM (TEE) (N/A) CLIPPING OF ATRIAL APPENDAGE (Left) LEFT ENDARTERECTOMY CAROTID DACRON PATCH ANGIOPLASTY (Left) Subjective: conts to feel better overall  Objective: Vital signs in last 24 hours: Temp:  [97.4 F (36.3 C)-97.6 F (36.4 C)] 97.4 F (36.3 C) (04/05 0410) Pulse Rate:  [82-102] 102 (04/05 0410) Cardiac Rhythm:  [-] Atrial fibrillation (04/04 1939) Resp:  [18] 18 (04/05 0410) BP: (89-116)/(72-97) 116/75 mmHg (04/05 0410) SpO2:  [97 %-98 %] 98 % (04/05 0410) Weight:  [161 lb 12.8 oz (73.392 kg)] 161 lb 12.8 oz (73.392 kg) (04/05 0410)  Hemodynamic parameters for last 24 hours:    Intake/Output from previous day: 04/04 0701 - 04/05 0700 In: 150 [P.O.:150] Out: 910 [Urine:450; Chest Tube:460] Intake/Output this shift:    General appearance: alert, cooperative and no distress Heart: irregularly irregular rhythm Lungs: clear to auscultation bilaterally Abdomen: benign Extremities: + LE edema Wound: incis healing well, mod echymosis BLE assoc with EVH  Lab Results:  Recent Labs  03/16/16 0213 03/17/16 0257  WBC 17.6* 16.6*  HGB 11.7* 11.7*  HCT 35.2* 35.6*  PLT 187 263   BMET:  Recent Labs  03/16/16 0213 03/17/16 0257  NA 136 137  K 3.9 3.6  CL 105 101  CO2 21* 24  GLUCOSE 106* 95  BUN 42* 37*  CREATININE 1.45* 1.34*  CALCIUM 9.1 9.4    PT/INR: No results for input(s): LABPROT, INR in the last 72 hours. ABG    Component Value Date/Time   PHART 7.356 03/12/2016 0400   HCO3 24.7* 03/12/2016 1622   TCO2 20 03/13/2016 1602   ACIDBASEDEF 2.0 03/12/2016 1622   O2SAT 63.4 03/13/2016 0449   CBG (last 3)   Recent Labs  03/16/16 1634 03/16/16 2156  03/17/16 0620  GLUCAP 121* 98 96    Meds Scheduled Meds: . amiodarone  200 mg Oral BID  . antiseptic oral rinse  7 mL Mouth Rinse BID  . aspirin EC  325 mg Oral Daily   Or  . aspirin  324 mg Per Tube Daily  . atorvastatin  80 mg Oral q1800  . bisacodyl  10 mg Oral Daily   Or  . bisacodyl  10 mg Rectal Daily  . docusate sodium  200 mg Oral Daily  . enoxaparin (LOVENOX) injection  30 mg Subcutaneous Q24H  . feeding supplement (ENSURE ENLIVE)  237 mL Oral BID BM  . furosemide  40 mg Oral Daily  . metolazone  5 mg Oral Daily  . metoprolol tartrate  25 mg Oral BID  . pantoprazole  40 mg Oral Daily  . potassium chloride  20 mEq Oral Daily  . potassium chloride  20 mEq Oral Once  . sodium chloride flush  3 mL Intravenous Q12H   Continuous Infusions: . sodium chloride 10 mL/hr at 03/11/16 2101  . sodium chloride 10 mL/hr at 03/11/16 2101   PRN Meds:.sodium chloride, metoprolol, ondansetron (ZOFRAN) IV, oxyCODONE, sodium chloride flush, traMADol  Xrays Dg Chest 2 View  03/17/2016  CLINICAL DATA:  Post CABG procedure. EXAM: CHEST  2 VIEW COMPARISON:  03/16/2016 FINDINGS: Left chest tube in a stable position. Evidence for a small left  apical pneumothorax. Patchy densities at the left lung base are most compatible with areas of atelectasis. Probable small right pleural effusion. Postsurgical changes from a CABG procedure and clipping of the left atrial appendage. No evidence for pulmonary edema. Heart size is stable. IMPRESSION: Small left apical pneumothorax.  Chest tube is still in place. Small amount of pleural fluid, probably on the right side. Left basilar atelectasis. Electronically Signed   By: Richarda OverlieAdam  Henn M.D.   On: 03/17/2016 08:13    Assessment/Plan: S/P Procedure(s) (LRB): CORONARY ARTERY BYPASS GRAFTING (CABG)TIMES 4 USING LEFT INTERNAL MAMMARY AND BILATERAL GREATER SAPHENOUS VEIN HARVESTED BY ENDOVIEN (N/A) TRANSESOPHAGEAL ECHOCARDIOGRAM (TEE) (N/A) CLIPPING OF ATRIAL  APPENDAGE (Left) LEFT ENDARTERECTOMY CAROTID DACRON PATCH ANGIOPLASTY (Left)  1 progressing well overall 2 cont to have >300 ml left chest tube drainage- keep 3 tol regular diet with SLP recs 4 cont lasix for volume overload- elevate scrotum for edema 5 afib is rate controlled 6 routine pulm toilet 7 no new labs today 8 sugars controlled   LOS: 16 days    GOLD,WAYNE E 03/18/2016  He has persistent peripheral edema and serous L effusion from prob RHF following MI - will add metolazone to lasix and add spironolactone Push protein intake Add TED hose  Needs to remain in hospital until HF better controlled  patient examined and medical record reviewed,agree with above note. Kathlee Nationseter Van Trigt III 03/18/2016

## 2016-03-18 NOTE — Progress Notes (Addendum)
Initial Nutrition Assessment  DOCUMENTATION CODES:   Not applicable  INTERVENTION:    Continue Ensure Enlive po BID, each supplement provides 350 kcal and 20 grams of protein  NUTRITION DIAGNOSIS:   Inadequate oral intake now related to poor appetite as evidenced by meal completion < 50%, ongoing  GOAL:   Patient will meet greater than or equal to 90% of their needs, progressing   MONITOR:   PO intake, Supplement acceptance, Labs, Weight trends, I & O's  ASSESSMENT:   Pt is 75 yo male, functional at baseline, [place] as [place] was > 220/120. Pt reports he has not seen doctor in years and currently reports only occasional exertional dyspnea and some weight gain but is not sure how many pounds. He denies fevers, chills, chest pain, no abd or urinary concerns, take no BP medications.   Patient s/p procedure 3/28: CORONARY ARTERY BYPASS GRAFTING (CABG) x 4  Patient extubated 3/29: Advanced to Heart Healthy diet 3/31. PO intake poor at 10-30% per flowsheet records. Ensure Enlive oral nutrition supplements in place.  Diet Order:  Diet Heart Room service appropriate?: Yes; Fluid consistency:: Thin  Skin:  Reviewed, no issues  Last BM:  4/4  Height:   Ht Readings from Last 1 Encounters:  03/14/16 5\' 2"  (1.575 m)    Weight:   Wt Readings from Last 1 Encounters:  03/18/16 161 lb 12.8 oz (73.392 kg)    Ideal Body Weight:  53.6 kg  BMI:  Body mass index is 29.59 kg/(m^2).  Estimated Nutritional Needs:   Kcal:  1800-2000  Protein:  90-100 gm  Fluid:  1.8-2.0 L  EDUCATION NEEDS:   No education needs identified at this time  Maureen ChattersKatie Esthefany Herrig, RD, LDN Pager #: [phone] After-Hours Pager #: [phone]

## 2016-03-18 NOTE — Progress Notes (Signed)
Advanced Heart Failure Rounding Note   Subjective:   Mr. Schara is a 75 year old male who has not sought medical care in many years who presented to ED on 03/02/16 with SOB, hypertensive with BP of 220/130, and lower extremity edema. His echo 03/03/2016 showed EF of 35% with diffuse hypokinesis with akinesis of the basal and midinferolateral walls. Left heart cath on 03/04/16 revealed multivessel disease, he is S/P CABG, Atrial clipping,  L carotid endarterectomy on 03/10/16.   Post operatively he has been doing ok but has significant peripheral edema. Also had AKI with creatinine peak at 2.6 after surgery and now back down to 1.34. Today metolazone and spiro added to 40 mg po lasix.  Weight up 17 pounds from admit. Pacing wires removed yesterday.   Denies CP/SOB. Complaining of leg edema.  Complaining of pain urinating.    Objective:   Weight Range:  Vital Signs:   Temp:  [97.4 F (36.3 C)-97.6 F (36.4 C)] 97.4 F (36.3 C) (04/05 0410) Pulse Rate:  [88-104] 104 (04/05 1050) Resp:  [18] 18 (04/05 0410) BP: (89-120)/(72-97) 120/96 mmHg (04/05 1050) SpO2:  [97 %-98 %] 98 % (04/05 0410) Weight:  [161 lb 12.8 oz (73.392 kg)] 161 lb 12.8 oz (73.392 kg) (04/05 0410) Last BM Date: 03/17/16  Weight change: Filed Weights   03/16/16 0600 03/17/16 0450 03/18/16 0410  Weight: 164 lb 10.9 oz (74.7 kg) 163 lb 6.4 oz (74.118 kg) 161 lb 12.8 oz (73.392 kg)    Intake/Output:   Intake/Output Summary (Last 24 hours) at 03/18/16 1133 Last data filed at 03/18/16 0629  Gross per 24 hour  Intake    150 ml  Output    910 ml  Net   -760 ml     Physical Exam: General:  Well appearing. No resp difficulty. Sitting in the chair HEENT: normal Neck: supple. JVP ~12-14. Carotids 2+ bilat; no bruits. No lymphadenopathy or thryomegaly appreciated. L lateral neck incision approximated. R neck ecchymotic.  Cor: PMI nondisplaced. Irregular rate & rhythm. No rubs, gallops or murmurs. Mediastinal tube.  Sternal incision CDI.  Lungs: clear Abdomen: soft, nontender, nondistended. No hepatosplenomegaly. No bruits or masses. Good bowel sounds. Extremities: no cyanosis, clubbing, rash, RLE ecchymotic 3+ edema LLE 2+ edema. R and LLE cool.  Neuro: alert & orientedx3, cranial nerves grossly intact. moves all 4 extremities w/o difficulty. Affect pleasant GU: Scrotal edema.   Telemetry:  A Fib 90s  Labs: Basic Metabolic Panel:  Recent Labs Lab 03/11/16 1628  03/13/16 0450 03/13/16 1602 03/13/16 2221 03/14/16 0509 03/15/16 0410 03/16/16 0213 03/17/16 0257  NA  --   < > 138 141 137 138 137 136 137  K  --   < > 4.1 2.9* 4.1 4.3 4.0 3.9 3.6  CL  --   < > 106 105  --  103 102 105 101  CO2  --   < > 25  --   --  26 26 21* 24  GLUCOSE  --   < > 121* 102* 114* 109* 105* 106* 95  BUN  --   < > 42* 30*  --  40* 43* 42* 37*  CREATININE 2.29*  < > 2.61* 1.50*  --  1.73* 1.74* 1.45* 1.34*  CALCIUM  --   < > 9.1  --   --  9.2 9.2 9.1 9.4  MG 2.4  --   --   --   --   --   --   --   --   < > =  values in this interval not displayed.  Liver Function Tests: No results for input(s): AST, ALT, ALKPHOS, BILITOT, PROT, ALBUMIN in the last 168 hours. No results for input(s): LIPASE, AMYLASE in the last 168 hours. No results for input(s): AMMONIA in the last 168 hours.  CBC:  Recent Labs Lab 03/13/16 0450  03/13/16 2221 03/14/16 0509 03/15/16 0410 03/16/16 0213 03/17/16 0257  WBC 19.8*  --   --  14.6* 17.7* 17.6* 16.6*  HGB 8.2*  < > 10.5* 10.9* 11.6* 11.7* 11.7*  HCT 23.8*  < > 31.0* 32.7* 35.4* 35.2* 35.6*  MCV 89.1  --   --  88.9 89.8 90.5 90.8  PLT 100*  --   --  115* 157 187 263  < > = values in this interval not displayed.  Cardiac Enzymes: No results for input(s): CKTOTAL, CKMB, CKMBINDEX, TROPONINI in the last 168 hours.  BNP: BNP (last 3 results)  Recent Labs  03/02/16 1421  BNP 513.7*    ProBNP (last 3 results) No results for input(s): PROBNP in the last 8760  hours.    Other results:  Imaging: Dg Chest 2 View  03/17/2016  CLINICAL DATA:  Post CABG procedure. EXAM: CHEST  2 VIEW COMPARISON:  03/16/2016 FINDINGS: Left chest tube in a stable position. Evidence for a small left apical pneumothorax. Patchy densities at the left lung base are most compatible with areas of atelectasis. Probable small right pleural effusion. Postsurgical changes from a CABG procedure and clipping of the left atrial appendage. No evidence for pulmonary edema. Heart size is stable. IMPRESSION: Small left apical pneumothorax.  Chest tube is still in place. Small amount of pleural fluid, probably on the right side. Left basilar atelectasis. Electronically Signed   By: Richarda OverlieAdam  Henn M.D.   On: 03/17/2016 08:13      Medications:     Scheduled Medications: . amiodarone  200 mg Oral BID  . antiseptic oral rinse  7 mL Mouth Rinse BID  . aspirin EC  325 mg Oral Daily   Or  . aspirin  324 mg Per Tube Daily  . atorvastatin  80 mg Oral q1800  . bisacodyl  10 mg Oral Daily   Or  . bisacodyl  10 mg Rectal Daily  . docusate sodium  200 mg Oral Daily  . enoxaparin (LOVENOX) injection  30 mg Subcutaneous Q24H  . feeding supplement (ENSURE ENLIVE)  237 mL Oral BID BM  . furosemide  40 mg Oral Daily  . metolazone  5 mg Oral Daily  . metoprolol tartrate  25 mg Oral BID  . pantoprazole  40 mg Oral Daily  . potassium chloride      . potassium chloride  20 mEq Oral Daily  . sodium chloride flush  3 mL Intravenous Q12H  . spironolactone  25 mg Oral Daily     Infusions: . sodium chloride 10 mL/hr at 03/11/16 2101  . sodium chloride 10 mL/hr at 03/11/16 2101     PRN Medications:  sodium chloride, metoprolol, ondansetron (ZOFRAN) IV, oxyCODONE, sodium chloride flush, traMADol   Assessment/Plan/Discussion   [name] is a 75 year old male who has not sought medical care in many years who presented to ED on 03/02/16 with SOB, hypertensive with BP of 220/130, and lower extremity  edema. His echo showed EF of 35% with diffuse hypokinesis with akinesis of the basal and midinferolateral walls. Left heart cath on 03/04/16 revealed multivessel disease, he had CABG x4  and left carotid endarterectomy on  03/10/16.   1. CAD S/P 03/10/2016 CABG x4  L CEA On aspirin, statin, and metoprolol tartrate 25 mg twice daily.   2. A/C Systolic  Heart Failure  TEE 03/10/2016 EF 30-35%. Volume status elevated. Clinically he does not look compromised. Stop po lasix and start 80 mg IV lasix twice daily. If he is difficult to diurese will need PICC for CO-OX. If low may need short term milrinone.   Stop metoprolol tartrate with low EF and start carvedilol 3.125 mg twice a day.  No Ace for now as SBP has been low.   Continue 25 mg spiro daily. Follow renal function closely. Adding ted hose.   3. A fib - EKG on 3/29 NSR but today Afib . Had L atrial appendage clipped this admit  On amio 200 mg twice a day. Rate controlled.  4.AKI- Creatinine peaked 2.6 >1.3  5. Dysuria - check UA/ urine culture. WBC elevated . Afebrile.  6. Deconditioning- PT following with recommendations for HHPT.   Repeat ECHO.   Length of Stay: 16   Amy Clegg NP-C  03/18/2016, 11:33 AM  Advanced Heart Failure Team Pager [phone] (M-F; 7a - 4p)  Please contact [facility] for night-coverage after hours (4p -7a ) and weekends on [email or web address]  Patient seen with NP, agree with the above note.   1. CAD: s/p CABG 03/10/16.  Continue ASA and statin.  2. Acute on chronic systolic CHF: EF 45-40%, ischemic cardiomyopathy.  On exam, he is very volume overloaded.   - Lasix 80 mg IV bid.  - Continue spironolactone.  - Stop metoprolol, start Coreg 3.125 mg bid.  - If creatinine stable, start ACEI tomorrow.  - Repeat echo.  3. Atrial fibrillation: Persistent.  Had LA appendage clipping but do not have data that these patients can go without anticoagulation in setting of atrial fibrillation.  He has extensive thigh ecchymoses but  hemoglobin stable.  Would start anticoagulation when ok with Dr Donata Clay, can use Eliquis or Xarelto.  He is on amiodarone.  If he remains in atrial fibrillation, should have eventual DCCV.  4. AKI: Creatinine improving, follow closely.   Marca Ancona 03/18/2016 4:36 PM

## 2016-03-19 ENCOUNTER — Other Ambulatory Visit (HOSPITAL_COMMUNITY): Payer: Commercial Managed Care - HMO

## 2016-03-19 DIAGNOSIS — I482 Chronic atrial fibrillation: Secondary | ICD-10-CM

## 2016-03-19 LAB — BASIC METABOLIC PANEL
Anion gap: 12 (ref 5–15)
BUN: 28 mg/dL — ABNORMAL HIGH (ref 6–20)
CO2: 26 mmol/L (ref 22–32)
Calcium: 9.3 mg/dL (ref 8.9–10.3)
Chloride: 99 mmol/L — ABNORMAL LOW (ref 101–111)
Creatinine, Ser: 1.19 mg/dL (ref 0.61–1.24)
GFR calc Af Amer: >60 mL/min (ref >60)
GFR calc non Af Amer: 58 mL/min — ABNORMAL LOW (ref >60)
Glucose, Bld: 90 mg/dL (ref 65–99)
Potassium: 3.1 mmol/L — ABNORMAL LOW (ref 3.5–5.1)
Sodium: 137 mmol/L (ref 135–145)

## 2016-03-19 LAB — URINE CULTURE: Culture: 1000 — AB

## 2016-03-19 MED ORDER — LISINOPRIL 2.5 MG PO TABS: 2.5000 mg | ORAL_TABLET | Freq: Every day | ORAL | Status: DC

## 2016-03-19 MED ORDER — POTASSIUM CHLORIDE CRYS ER 20 MEQ PO TBCR
20.0000 meq | EXTENDED_RELEASE_TABLET | Freq: Once | ORAL | Status: AC
  Administered 2016-03-19: 20 meq via ORAL
  Filled 2016-03-19: qty 1

## 2016-03-19 MED ORDER — CEPHALEXIN 500 MG PO CAPS
500.0000 mg | ORAL_CAPSULE | Freq: Four times a day (QID) | ORAL | Status: DC
  Filled 2016-03-19: qty 1

## 2016-03-19 MED ORDER — POTASSIUM CHLORIDE CRYS ER 20 MEQ PO TBCR
40.0000 meq | EXTENDED_RELEASE_TABLET | Freq: Once | ORAL | Status: AC
  Administered 2016-03-19: 40 meq via ORAL
  Filled 2016-03-19: qty 2

## 2016-03-19 MED ORDER — CEPHALEXIN 250 MG/5ML PO SUSR
500.0000 mg | Freq: Four times a day (QID) | ORAL | Status: DC
  Administered 2016-03-19 – 2016-03-24 (×19): 500 mg via ORAL
  Filled 2016-03-19 (×22): qty 10

## 2016-03-19 MED ORDER — LISINOPRIL 2.5 MG PO TABS
2.5000 mg | ORAL_TABLET | Freq: Every day | ORAL | Status: DC
  Administered 2016-03-19: 2.5 mg via ORAL
  Filled 2016-03-19 (×2): qty 1

## 2016-03-19 MED ORDER — LISINOPRIL 2.5 MG PO TABS: 2.5000 mg | ORAL_TABLET | Freq: Once | ORAL | Status: DC

## 2016-03-19 MED ORDER — APIXABAN 2.5 MG PO TABS
2.5000 mg | ORAL_TABLET | Freq: Two times a day (BID) | ORAL | Status: DC
  Administered 2016-03-19 (×2): 2.5 mg via ORAL
  Filled 2016-03-19 (×4): qty 1

## 2016-03-19 NOTE — Care Management Important Message (Signed)
Important Message  Patient Details  Name: [name] [name] MRN: [id number] Date of Birth: 1941-07-17   Medicare Important Message Given:  Yes    [name] [name] 03/19/2016, 11:07 AM

## 2016-03-19 NOTE — Progress Notes (Addendum)
      [address]       [place] [id number]             [phone]      9 Days Post-Op Procedure(s) (LRB): CORONARY ARTERY BYPASS GRAFTING (CABG)TIMES 4 USING LEFT INTERNAL MAMMARY AND BILATERAL GREATER SAPHENOUS VEIN HARVESTED BY ENDOVIEN (N/A) TRANSESOPHAGEAL ECHOCARDIOGRAM (TEE) (N/A) CLIPPING OF ATRIAL APPENDAGE (Left) LEFT ENDARTERECTOMY CAROTID DACRON PATCH ANGIOPLASTY (Left)   Subjective:  [name] has no complaints this morning.    Objective: Vital signs in last 24 hours: Temp:  [98.1 F (36.7 C)-98.3 F (36.8 C)] 98.1 F (36.7 C) (04/06 0358) Pulse Rate:  [99-106] 99 (04/06 0358) Cardiac Rhythm:  [-] Atrial fibrillation (04/05 1900) Resp:  [17-18] 17 (04/06 0358) BP: (104-120)/(68-96) 104/68 mmHg (04/06 0358) SpO2:  [97 %-98 %] 97 % (04/06 0358) Weight:  [156 lb 15.5 oz (71.2 kg)] 156 lb 15.5 oz (71.2 kg) (04/06 0358)  Intake/Output from previous day: 04/05 0701 - 04/06 0700 In: 480 [P.O.:480] Out: 2195 [Urine:2000; Chest Tube:195]  General appearance: alert, cooperative and no distress Heart: irregularly irregular rhythm Lungs: clear to auscultation bilaterally Abdomen: soft, non-tender; bowel sounds normal; no masses,  no organomegaly Extremities: edema 1-2+ pitting Wound: clean and dry  Lab Results:  Recent Labs  03/17/16 0257  WBC 16.6*  HGB 11.7*  HCT 35.6*  PLT 263   BMET:  Recent Labs  03/17/16 0257 03/19/16 0257  NA 137 137  K 3.6 3.1*  CL 101 99*  CO2 24 26  GLUCOSE 95 90  BUN 37* 28*  CREATININE 1.34* 1.19  CALCIUM 9.4 9.3    PT/INR: No results for input(s): LABPROT, INR in the last 72 hours. ABG    Component Value Date/Time   PHART 7.356 03/12/2016 0400   HCO3 24.7* 03/12/2016 1622   TCO2 20 03/13/2016 1602   ACIDBASEDEF 2.0 03/12/2016 1622   O2SAT 63.4 03/13/2016 0449   CBG (last 3)   Recent Labs  03/16/16 1634 03/16/16 2156 03/17/16 0620  GLUCAP 121* 98 96    Assessment/Plan: S/P  Procedure(s) (LRB): CORONARY ARTERY BYPASS GRAFTING (CABG)TIMES 4 USING LEFT INTERNAL MAMMARY AND BILATERAL GREATER SAPHENOUS VEIN HARVESTED BY ENDOVIEN (N/A) TRANSESOPHAGEAL ECHOCARDIOGRAM (TEE) (N/A) CLIPPING OF ATRIAL APPENDAGE (Left) LEFT ENDARTERECTOMY CAROTID DACRON PATCH ANGIOPLASTY (Left)  1. CV- A. Fib, remains hemodynamically stable, continue Amjodarone, Coreg,  2. Pulm- no acute issues, continue IS 3. Renal- creatinine trending down at 1.19, continue aggressive diuresis per HF 4. Dysphagia- improved, continue current diet 5. Dispo- patient stable, continue diuresis per HF, CT with 195 cc output yesterday probably still a little high to remove will defer to Dr. Donata ClayVan Trigt, repeat CXR in AM   LOS: 17 days    [name], [name] 03/19/2016  DC chest tube 4-7 am Cont with diuresis Edema getting better Start keflex for cellulitis RLE patient examined and medical record reviewed,agree with above note. [name] Nationseter Van Trigt III 03/19/2016

## 2016-03-19 NOTE — Progress Notes (Signed)
CARDIAC REHAB PHASE I   PRE:  Rate/Rhythm: 102 afib    BP: sitting 96/74    SaO2: 95 RA  MODE:  Ambulation: 550 ft   POST:  Rate/Rhythm: 115 afib    BP: sitting 126/79     SaO2: 94 RA  Tolerated well, no c/o. Able to increase distance. Encouraged x2 more walks. Return to recliner. [id number]   [name] MassonRandi Kristan [name] CES, ACSM 03/19/2016 11:15 AM

## 2016-03-19 NOTE — Progress Notes (Signed)
Advanced Heart Failure Rounding Note   Subjective:   [name] [name] is a 75 year old male who has not sought medical care in many years who presented to ED on 03/02/16 with SOB, hypertensive with BP of 220/130, and lower extremity edema. His echo 03/03/2016 showed EF of 35% with diffuse hypokinesis with akinesis of the basal and midinferolateral walls. Left heart cath on 03/04/16 revealed multivessel disease, he is S/P CABG, Atrial clipping,  L carotid endarterectomy on 03/10/16.   Post operatively he has been doing ok but has significant peripheral edema. Also had AKI with creatinine peak at 2.6 after surgery and then back down to 1.34. 4/5/17metolazone and spiro added to 40 mg po lasix. Pacing wires removed 03/17/16.   Says his legs feel better but still swollen.  Denies CP/SOB. No Orthopnea. Denies lightheadedness or dizziness.   I/O not impressive yesterday, but already out 2 L this am.  Weight shows down 5 lbs. Creatinine improved 1.34 -> 1.19. Weight 156 this am.  Remains 12 lbs up from admit.   Objective:   Weight Range:  Vital Signs:   Temp:  [98.1 F (36.7 C)-98.3 F (36.8 C)] 98.1 F (36.7 C) (04/06 0358) Pulse Rate:  [99-106] 99 (04/06 0358) Resp:  [17-18] 17 (04/06 0358) BP: (104-120)/(68-96) 104/68 mmHg (04/06 0358) SpO2:  [97 %-98 %] 97 % (04/06 0358) Weight:  [156 lb 15.5 oz (71.2 kg)] 156 lb 15.5 oz (71.2 kg) (04/06 0358) Last BM Date: 03/18/16  Weight change: Filed Weights   03/17/16 0450 03/18/16 0410 03/19/16 0358  Weight: 163 lb 6.4 oz (74.118 kg) 161 lb 12.8 oz (73.392 kg) 156 lb 15.5 oz (71.2 kg)    Intake/Output:   Intake/Output Summary (Last 24 hours) at 03/19/16 0721 Last data filed at 03/19/16 0657  Gross per 24 hour  Intake    480 ml  Output   2195 ml  Net  -1715 ml     Physical Exam: General:  Well appearing. No resp difficulty. Sitting in the chair HEENT: normal Neck: supple. JVP ~10-11 cm. Carotids 2+ bilat; no bruits. No thyromegaly or  nodule noted. L lateral neck incision approximated, C/D/I. R neck ecchymotic.  Cor: PMI nondisplaced. Irregular rate & rhythm. No M/G/R appreciated. Mediastinal tube. Sternal incision CDI.  Lungs: CTAB, normal effort  Abdomen: soft, NT, ND, no HSM. No bruits or masses. +BS  Extremities: no cyanosis, clubbing, rash, TED hose in place. 1+ edema LEs. 2 + edema into thighs. R and LLE cool.  Neuro: alert & orientedx3, cranial nerves grossly intact. moves all 4 extremities w/o difficulty. Affect pleasant GU: + Scrotal edema.   Telemetry: Reviewed personally,  A Fib 90-100s  Labs: Basic Metabolic Panel:  Recent Labs Lab 03/14/16 0509 03/15/16 0410 03/16/16 0213 03/17/16 0257 03/19/16 0257  NA 138 137 136 137 137  K 4.3 4.0 3.9 3.6 3.1*  CL 103 102 105 101 99*  CO2 26 26 21* 24 26  GLUCOSE 109* 105* 106* 95 90  BUN 40* 43* 42* 37* 28*  CREATININE 1.73* 1.74* 1.45* 1.34* 1.19  CALCIUM 9.2 9.2 9.1 9.4 9.3    Liver Function Tests: No results for input(s): AST, ALT, ALKPHOS, BILITOT, PROT, ALBUMIN in the last 168 hours. No results for input(s): LIPASE, AMYLASE in the last 168 hours. No results for input(s): AMMONIA in the last 168 hours.  CBC:  Recent Labs Lab 03/13/16 0450  03/13/16 2221 03/14/16 0509 03/15/16 0410 03/16/16 0213 03/17/16 0257  WBC 19.8*  --   --  14.6* 17.7* 17.6* 16.6*  HGB 8.2*  < > 10.5* 10.9* 11.6* 11.7* 11.7*  HCT 23.8*  < > 31.0* 32.7* 35.4* 35.2* 35.6*  MCV 89.1  --   --  88.9 89.8 90.5 90.8  PLT 100*  --   --  115* 157 187 263  < > = values in this interval not displayed.  Cardiac Enzymes: No results for input(s): CKTOTAL, CKMB, CKMBINDEX, TROPONINI in the last 168 hours.  BNP: BNP (last 3 results)  Recent Labs  03/02/16 1421  BNP 513.7*    ProBNP (last 3 results) No results for input(s): PROBNP in the last 8760 hours.    Other results:  Imaging: No results found.   Medications:     Scheduled Medications: . amiodarone  200  mg Oral BID  . antiseptic oral rinse  7 mL Mouth Rinse BID  . aspirin EC  325 mg Oral Daily   Or  . aspirin  324 mg Per Tube Daily  . atorvastatin  80 mg Oral q1800  . bisacodyl  10 mg Oral Daily   Or  . bisacodyl  10 mg Rectal Daily  . carvedilol  3.125 mg Oral BID WC  . docusate sodium  200 mg Oral Daily  . enoxaparin (LOVENOX) injection  30 mg Subcutaneous Q24H  . feeding supplement (ENSURE ENLIVE)  237 mL Oral BID BM  . furosemide  80 mg Intravenous BID  . pantoprazole  40 mg Oral Daily  . potassium chloride  20 mEq Oral Daily  . sodium chloride flush  3 mL Intravenous Q12H  . spironolactone  25 mg Oral Daily    Infusions: . sodium chloride 10 mL/hr at 03/11/16 2101  . sodium chloride 10 mL/hr at 03/11/16 2101    PRN Medications: sodium chloride, metoprolol, ondansetron (ZOFRAN) IV, oxyCODONE, sodium chloride flush, traMADol   Assessment/Plan/Discussion   [name] is a 75 year old male who has not sought medical care in many years who presented to ED on 03/02/16 with SOB, hypertensive with BP of 220/130, and lower extremity edema. His echo showed EF of 35% with diffuse hypokinesis with akinesis of the basal and midinferolateral walls. Left heart cath on 03/04/16 revealed multivessel disease, he had CABG x4  and left carotid endarterectomy on 03/10/16.   1. CAD S/P 03/10/2016 CABG x4  L CEA On aspirin, statin, and metoprolol tartrate 25 mg twice daily.   2. A/C Systolic  Heart Failure  TEE 03/10/2016 EF 30-35%. Volume status elevated. Clinically he does not look compromised.  - Continue 80 mg IV lasix twice daily. Increased K supp for today. (Will get 80 meq total) - Continue carvedilol 3.125 mg twice a day.  - Consider adding ACEi in evening with borderline SBP. (if BP drops will be while he sleeps) - Continue 25 mg spiro daily. Follow renal function closely.  - Continue ted hose for peripheral edema. 3. A fib - EKG on 3/29 NSR but today Afib . Had L atrial appendage  clipped this admit  On amio 200 mg twice a day. Rate controlled.  - Will need antiocoag when ok with Dr Donata Clay. Likely Eliquis or Xarelto.  - May eventually need DCCV if he remains in Afib.  4. AKI- Creatinine peaked 2.6 >1.3 > 1.14 - Stable to improved. Consider ACEi as above.  5. Dysuria - check UA/ urine culture. WBC elevated . Afebrile.  6. Deconditioning- PT following with recommendations for HHPT.   Repeat ECHO pending  Length of Stay:  17  Mariam Dollar Tillery PA-C  03/19/2016, 7:21 AM  Advanced Heart Failure Team Pager [phone] (M-F; 7a - 4p)  Please contact [facility] for night-coverage after hours (4p -7a ) and weekends on [email or web address]  Patient seen with PA, agree with the above note.  1. CAD: s/p CABG 03/10/16. Continue ASA and statin.  2. Acute on chronic systolic CHF: EF 45-40%, ischemic cardiomyopathy. On exam, he is still very volume overloaded. Weight coming down with diuresis.  - Continue Lasix 80 mg IV bid.  - Continue spironolactone.  - Stop metoprolol, start Coreg 3.125 mg bid.  - Start lisinopril 2.5 daily.   - Repeat echo (ordered).  3. Atrial fibrillation: Persistent. Had LA appendage clipping but do not have data that these patients can go without anticoagulation in setting of atrial fibrillation. He has extensive thigh ecchymoses but hemoglobin stable. Start Eliquis today.  He is on amiodarone. If he remains in atrial fibrillation, should have eventual DCCV => would do TEE-guided DCCV on Monday if he is here and remains in atrial fibrillation. If he goes home, anticoagulate x 1 month then DCCV without TEE.  4. AKI: Creatinine improving, follow closely.   Marca Ancona 03/19/2016 4:18 PM

## 2016-03-19 NOTE — Progress Notes (Signed)
Physical Therapy Treatment Patient Details Name: [name] [name] MRN: [id number] DOB: 11/18/1941 Today's Date: 03/19/2016    History of Present Illness Patient is a 75 y/o male who does not regularly seek medical care who presents with SOB and was found to have acute systolic and diastolic heart failure. Pt s/p CABG, CLIPPING OF ATRIAL APPENDAGE and LEFT ENDARTERECTOMY CAROTID     PT Comments    Pt performed increased gait distance from previous session reports increased fatigue this pm.  HR elevated to 120s. Pt required cues to maintain and recall sternal precautions throughout tx.    Follow Up Recommendations  Home health PT;Supervision/Assistance - 24 hour     Equipment Recommendations  Rolling walker with 5" wheels    Recommendations for Other Services       Precautions / Restrictions Precautions Precautions: Fall;Sternal Precaution Comments: needs cues for generalizing sternal precautions    Mobility  Bed Mobility Overal bed mobility: Needs Assistance   Rolling: Min guard Sidelying to sit: Min assist (cues to defer use of UE.  Pt tries reaching for rails, required cues to maintain hold of heart pillow to discourage use.  )          Transfers Overall transfer level: Needs assistance Equipment used: Rolling walker (2 wheeled) Transfers: Sit to/from Stand Sit to Stand: Min guard         General transfer comment: no physical assist, cues for rocking momentum and to avoid use of UEs.    Ambulation/Gait Ambulation/Gait assistance: Min guard Ambulation Distance (Feet): 180 Feet Assistive device: Rolling walker (2 wheeled) Gait Pattern/deviations: Step-through pattern;Decreased stride length;Trunk flexed     General Gait Details: Slow, mostly steady gait however some mild imbalance during turns or when distracted. HR remains up to120s.   Stairs            Wheelchair Mobility    Modified Rankin (Stroke Patients Only)       Balance Overall balance  assessment: Needs assistance   Sitting balance-Leahy Scale: Good       Standing balance-Leahy Scale: Fair                      Cognition Arousal/Alertness: Awake/alert Behavior During Therapy: WFL for tasks assessed/performed Overall Cognitive Status: Within Functional Limits for tasks assessed Area of Impairment: Orientation Orientation Level: Disoriented to;Time             General Comments: Remains to require cues during treatment to defer use of UEs.      Exercises      General Comments        Pertinent Vitals/Pain Pain Assessment: No/denies pain    Home Living                      Prior Function            PT Goals (current goals can now be found in the care plan section) Acute Rehab PT Goals Patient Stated Goal: return home with wife Potential to Achieve Goals: Good Progress towards PT goals: Progressing toward goals    Frequency  Min 3X/week    PT Plan Current plan remains appropriate    Co-evaluation             End of Session Equipment Utilized During Treatment: Gait belt Activity Tolerance: Patient tolerated treatment well Patient left: in chair;with call bell/phone within reach     Time: 1456-1510 PT Time Calculation (min) (ACUTE ONLY): 14 min  Charges:  $  Gait Training: 8-22 mins                    G Codes:      Florestine Aversimee J Cleda Imel 03/19/2016, 3:19 PM  Joycelyn RuaAimee Temple Sporer, PTA pager [phone]

## 2016-03-20 ENCOUNTER — Inpatient Hospital Stay (HOSPITAL_COMMUNITY): Payer: Commercial Managed Care - HMO

## 2016-03-20 DIAGNOSIS — I259 Chronic ischemic heart disease, unspecified: Secondary | ICD-10-CM

## 2016-03-20 LAB — BASIC METABOLIC PANEL
Anion gap: 10 (ref 5–15)
BUN: 29 mg/dL — AB (ref 6–20)
CHLORIDE: 97 mmol/L — AB (ref 101–111)
CO2: 29 mmol/L (ref 22–32)
CREATININE: 1.33 mg/dL — AB (ref 0.61–1.24)
Calcium: 9.3 mg/dL (ref 8.9–10.3)
GFR calc Af Amer: 59 mL/min — ABNORMAL LOW (ref >60)
GFR calc non Af Amer: 51 mL/min — ABNORMAL LOW (ref >60)
GLUCOSE: 111 mg/dL — AB (ref 65–99)
POTASSIUM: 3.2 mmol/L — AB (ref 3.5–5.1)
SODIUM: 136 mmol/L (ref 135–145)

## 2016-03-20 LAB — ECHOCARDIOGRAM COMPLETE
Height: 62 in
WEIGHTICAEL: 2424 [oz_av]

## 2016-03-20 MED ORDER — AMIODARONE HCL 200 MG PO TABS
200.0000 mg | ORAL_TABLET | Freq: Every day | ORAL | Status: DC
  Administered 2016-03-21 – 2016-03-24 (×4): 200 mg via ORAL
  Filled 2016-03-20 (×4): qty 1

## 2016-03-20 MED ORDER — ASPIRIN EC 81 MG PO TBEC
81.0000 mg | DELAYED_RELEASE_TABLET | Freq: Every day | ORAL | Status: DC
  Administered 2016-03-20 – 2016-03-24 (×5): 81 mg via ORAL
  Filled 2016-03-20 (×5): qty 1

## 2016-03-20 MED ORDER — POTASSIUM CHLORIDE 20 MEQ/15ML (10%) PO SOLN
40.0000 meq | Freq: Every day | ORAL | Status: DC
  Administered 2016-03-20 – 2016-03-24 (×5): 40 meq via ORAL
  Filled 2016-03-20 (×5): qty 30

## 2016-03-20 MED ORDER — FUROSEMIDE 40 MG PO TABS
40.0000 mg | ORAL_TABLET | Freq: Every day | ORAL | Status: DC
  Administered 2016-03-20 – 2016-03-21 (×2): 40 mg via ORAL
  Filled 2016-03-20 (×2): qty 1

## 2016-03-20 MED ORDER — APIXABAN 5 MG PO TABS
5.0000 mg | ORAL_TABLET | Freq: Two times a day (BID) | ORAL | Status: DC
  Administered 2016-03-20 – 2016-03-24 (×9): 5 mg via ORAL
  Filled 2016-03-20 (×9): qty 1

## 2016-03-20 MED ORDER — LISINOPRIL 2.5 MG PO TABS: 2.5000 mg | ORAL_TABLET | Freq: Every day | ORAL | Status: DC

## 2016-03-20 MED ORDER — LISINOPRIL 2.5 MG PO TABS
2.5000 mg | ORAL_TABLET | Freq: Every day | ORAL | Status: DC
  Administered 2016-03-20 – 2016-03-21 (×2): 2.5 mg via ORAL
  Filled 2016-03-20 (×2): qty 1

## 2016-03-20 NOTE — Discharge Instructions (Addendum)
Information on my medicine - ELIQUIS (apixaban)  This medication education was reviewed with me or my healthcare representative as part of my discharge preparation.  The pharmacist that spoke with me during my hospital stay was:  Fredrik RiggerMarkle, Jennifer Sue, [facility]  Why was Eliquis prescribed for you? Eliquis was prescribed for you to reduce the risk of a blood clot forming that can cause a stroke if you have a medical condition called atrial fibrillation (a type of irregular heartbeat).  What do You need to know about Eliquis ? Take your Eliquis TWICE DAILY - one tablet in the morning and one tablet in the evening with or without food. If you have difficulty swallowing the tablet whole please discuss with your pharmacist how to take the medication safely.  Take Eliquis exactly as prescribed by your doctor and DO NOT stop taking Eliquis without talking to the doctor who prescribed the medication.  Stopping may increase your risk of developing a stroke.  Refill your prescription before you run out.  After discharge, you should have regular check-up appointments with your healthcare provider that is prescribing your Eliquis.  In the future your dose may need to be changed if your kidney function or weight changes by a significant amount or as you get older.  What do you do if you miss a dose? If you miss a dose, take it as soon as you remember on the same day and resume taking twice daily.  Do not take more than one dose of ELIQUIS at the same time to make up a missed dose.  Important Safety Information A possible side effect of Eliquis is bleeding. You should call your healthcare provider right away if you experience any of the following: ? Bleeding from an injury or your nose that does not stop. ? Unusual colored urine (red or dark brown) or unusual colored stools (red or black). ? Unusual bruising for unknown reasons. ? A serious fall or if you hit your head (even if there is no  bleeding).  Some medicines may interact with Eliquis and might increase your risk of bleeding or clotting while on Eliquis. To help avoid this, consult your healthcare provider or pharmacist prior to using any new prescription or non-prescription medications, including herbals, vitamins, non-steroidal anti-inflammatory drugs (NSAIDs) and supplements.  This website has more information on Eliquis (apixaban): http://www.eliquis.com/eliquis/home   Coronary Artery Bypass Grafting, Care After Refer to this sheet in the next few weeks. These instructions provide you with information on caring for yourself after your procedure. Your health care provider may also give you more specific instructions. Your treatment has been planned according to current medical practices, but problems sometimes occur. Call your health care provider if you have any problems or questions after your procedure. WHAT TO EXPECT AFTER THE PROCEDURE Recovery from surgery will be different for everyone. Some people feel well after 3 or 4 weeks, while for others it takes longer. After your procedure, it is typical to have the following:  Nausea and a lack of appetite.   Constipation.  Weakness and fatigue.   Depression or irritability.   Pain or discomfort at your incision site. HOME CARE INSTRUCTIONS  Take medicines only as directed by your health care provider. Do not stop taking medicines or start any new medicines without first checking with your health care provider.  Take your pulse as directed by your health care provider.  Perform deep breathing as directed by your health care provider. If you were given a  device called an incentive spirometer, use it to practice deep breathing several times a day. Support your chest with a pillow or your arms when you take deep breaths or cough.  Keep incision areas clean, dry, and protected. Remove or change any bandages (dressings) only as directed by your health care  provider. You may have skin adhesive strips over the incision areas. Do not take the strips off. They will fall off on their own.  Check incision areas daily for any swelling, redness, or drainage.  If incisions were made in your legs, do the following:  Avoid crossing your legs.   Avoid sitting for long periods of time. Change positions every 30 minutes.   Elevate your legs when you are sitting.  Wear compression stockings as directed by your health care provider. These stockings help keep blood clots from forming in your legs.  Take showers once your health care provider approves. Until then, only take sponge baths. Pat incisions dry. Do not rub incisions with a washcloth or towel. Do not take baths, swim, or use a hot tub until your health care provider approves.  Eat foods that are high in fiber, such as raw fruits and vegetables, whole grains, beans, and nuts. Meats should be lean cut. Avoid canned, processed, and fried foods.  Drink enough fluid to keep your urine clear or pale yellow.  Weigh yourself every day. This helps identify if you are retaining fluid that may make your heart and lungs work harder.  Rest and limit activity as directed by your health care provider. You may be instructed to:  Stop any activity at once if you have chest pain, shortness of breath, irregular heartbeats, or dizziness. Get help right away if you have any of these symptoms.  Move around frequently for short periods or take short walks as directed by your health care provider. Increase your activities gradually. You may need physical therapy or cardiac rehabilitation to help strengthen your muscles and build your endurance.  Avoid lifting, pushing, or pulling anything heavier than 10 lb (4.5 kg) for at least 6 weeks after surgery.  Do not drive until your health care provider approves.  Ask your health care provider when you may return to work.  Ask your health care provider when you may  resume sexual activity.  Keep all follow-up visits as directed by your health care provider. This is important. SEEK MEDICAL CARE IF:  You have swelling, redness, increasing pain, or drainage at the site of an incision.  You have a fever.  You have swelling in your ankles or legs.  You have pain in your legs.   You gain 2 or more pounds (0.9 kg) a day.  You are nauseous or vomit.  You have diarrhea. SEEK IMMEDIATE MEDICAL CARE IF:  You have chest pain that goes to your jaw or arms.  You have shortness of breath.   You have a fast or irregular heartbeat.   You notice a "clicking" in your breastbone (sternum) when you move.   You have numbness or weakness in your arms or legs.  You feel dizzy or light-headed.  MAKE SURE YOU:  Understand these instructions.  Will watch your condition.  Will get help right away if you are not doing well or get worse.   This information is not intended to replace advice given to you by your health care provider. Make sure you discuss any questions you have with your health care provider.   Document Released: 06/19/2005  Document Revised: 12/21/2014 Document Reviewed: 05/09/2013 Elsevier Interactive Patient Education 2016 Else[employer] Inc[employer]  Endoscopic Saphenous Vein Harvesting, Care After Refer to this sheet in the next few weeks. These instructions provide you with information on caring for yourself after your procedure. Your health care provider may also give you more specific instructions. Your treatment has been planned according to current medical practices, but problems sometimes occur. Call your health care provider if you have any problems or questions after your procedure. HOME CARE INSTRUCTIONS Medicine  Take whatever pain medicine your surgeon prescribes. Follow the directions carefully. Do not take over-the-counter pain medicine unless your surgeon says it is okay. Some pain medicine can cause bleeding problems for several  weeks after surgery.  Follow your surgeon's instructions about driving. You will probably not be permitted to drive after heart surgery.  Take any medicines your surgeon prescribes. Any medicines you took before your heart surgery should be checked with your health care provider before you start taking them again. Wound care  If your surgeon has prescribed an elastic bandage or stocking, ask how long you should wear it.  Check the area around your surgical cuts (incisions) whenever your bandages (dressings) are changed. Look for any redness or swelling.  You will need to return to have the stitches (sutures) or staples taken out. Ask your surgeon when to do that.  Ask your surgeon when you can shower or bathe. Activity  Try to keep your legs raised when you are sitting.  Do any exercises your health care providers have given you. These may include deep breathing exercises, coughing, walking, or other exercises. SEEK MEDICAL CARE IF:  You have any questions about your medicines.  You have more leg pain, especially if your pain medicine stops working.  New or growing bruises develop on your leg.  Your leg swells, feels tight, or becomes red.  You have numbness in your leg. SEEK IMMEDIATE MEDICAL CARE IF:  Your pain gets much worse.  Blood or fluid leaks from any of the incisions.  Your incisions become warm, swollen, or red.  You have chest pain.  You have trouble breathing.  You have a fever.  You have more pain near your leg incision. MAKE SURE YOU:  Understand these instructions.  Will watch your condition.  Will get help right away if you are not doing well or get worse.   This information is not intended to replace advice given to you by your health care provider. Make sure you discuss any questions you have with your health care provider.   Document Released: 08/12/2011 Document Revised: 12/21/2014 Document Reviewed: 08/12/2011 Elsevier Interactive Patient  Education 2016 Else[employer] Inc.  Atrial Fibrillation Atrial fibrillation is a type of heartbeat that is irregular or fast (rapid). If you have this condition, your heart keeps quivering in a weird (chaotic) way. This condition can make it so your heart cannot pump blood normally. Having this condition gives a person more risk for stroke, heart failure, and other heart problems. There are different types of atrial fibrillation. Talk with your doctor to learn about the type that you have. HOME CARE  Take over-the-counter and prescription medicines only as told by your doctor.  If your doctor prescribed a blood-thinning medicine, take it exactly as told. Taking too much of it can cause bleeding. If you do not take enough of it, you will not have the protection that you need against stroke and other problems.  Do not use any tobacco products. These  include cigarettes, chewing tobacco, and e-cigarettes. If you need help quitting, ask your doctor.  If you have apnea (obstructive sleep apnea), manage it as told by your doctor.  Do not drink alcohol.  Do not drink beverages that have caffeine. These include coffee, soda, and tea.  Maintain a healthy weight. Do not use diet pills unless your doctor says they are safe for you. Diet pills may make heart problems worse.  Follow diet instructions as told by your doctor.  Exercise regularly as told by your doctor.  Keep all follow-up visits as told by your doctor. This is important. GET HELP IF:  You notice a change in the speed, rhythm, or strength of your heartbeat.  You are taking a blood-thinning medicine and you notice more bruising.  You get tired more easily when you move or exercise. GET HELP RIGHT AWAY IF:  You have pain in your chest or your belly (abdomen).  You have sweating or weakness.  You feel sick to your stomach (nauseous).  You notice blood in your throw up (vomit), poop (stool), or pee (urine).  You are short of  breath.  You suddenly have swollen feet and ankles.  You feel dizzy.  Your suddenly get weak or numb in your face, arms, or legs, especially if it happens on one side of your body.  You have trouble talking, trouble understanding, or both.  Your face or your eyelid droops on one side. These symptoms may be an emergency. Do not wait to see if the symptoms will go away. Get medical help right away. Call your local emergency services (911 in the [place] Do not drive yourself to the hospital.   This information is not intended to replace advice given to you by your health care provider. Make sure you discuss any questions you have with your health care provider.   Document Released: 09/08/2008 Document Revised: 08/21/2015 Document Reviewed: 03/27/2015 Elsevier Interactive Patient Education [employer]

## 2016-03-20 NOTE — Progress Notes (Signed)
Insurance check completed for Eliquis [place] will be $47 for a 30 day supply of Eliquis 5mg  BID. Pre Authorization is not needed

## 2016-03-20 NOTE — Progress Notes (Signed)
Chest tube pulled at 1:40PM. Vasoline dressing and gauze applied. Pt denies pain or discomfort. Site clean, dry, slightly red around insertion site. Call light within reach, will continue to monitor.   Leonidas Rombergaitlin S Bumbledare, RN

## 2016-03-20 NOTE — Progress Notes (Signed)
Advanced Heart Failure Rounding Note   Subjective:   [name] [name] is a 75 year old male who has not sought medical care in many years who presented to ED on 03/02/16 with SOB, hypertensive with BP of 220/130, and lower extremity edema. His echo 03/03/2016 showed EF of 35% with diffuse hypokinesis with akinesis of the basal and midinferolateral walls. Left heart cath on 03/04/16 revealed multivessel disease, he is S/P CABG, Atrial clipping,  L carotid endarterectomy on 03/10/16.   Post operatively he has been doing ok but has significant peripheral edema. Also had AKI with creatinine peak at 2.6 after surgery and then back down to 1.34. 4/5/17metolazone and spiro added to 40 mg po lasix. Pacing wires removed 03/17/16.   Yesterday he continued to diurese with IV lasix. Brisk diuresis noted. Weight down another 5 pounds. Creatinine 1.19>1.3  Complaining of nausea/diarrhea  Objective:   Weight Range:  Vital Signs:   Temp:  [97.8 F (36.6 C)-98.1 F (36.7 C)] 98.1 F (36.7 C) (04/07 0457) Pulse Rate:  [108-122] 108 (04/07 0457) Resp:  [18-21] 21 (04/07 0457) BP: (94-107)/(74-84) 94/74 mmHg (04/07 0457) SpO2:  [90 %-95 %] 90 % (04/07 0457) Weight:  [151 lb 8 oz (68.72 kg)] 151 lb 8 oz (68.72 kg) (04/07 0457) Last BM Date: 03/19/16  Weight change: Filed Weights   03/18/16 0410 03/19/16 0358 03/20/16 0457  Weight: 161 lb 12.8 oz (73.392 kg) 156 lb 15.5 oz (71.2 kg) 151 lb 8 oz (68.72 kg)    Intake/Output:   Intake/Output Summary (Last 24 hours) at 03/20/16 0744 Last data filed at 03/20/16 0512  Gross per 24 hour  Intake    240 ml  Output   2890 ml  Net  -2650 ml     Physical Exam: General:  Pale No resp difficulty. Sitting in the chair HEENT: normal Neck: supple. JVP ~7-8 cm. Carotids 2+ bilat; no bruits. No thyromegaly or nodule noted. L lateral neck incision approximated, C/D/I. R neck ecchymotic.  Cor: PMI nondisplaced. Irregular rate & rhythm. No M/G/R appreciated.  Mediastinal tube. Sternal incision CDI.  Lungs: CTAB, normal effort  Abdomen: soft, NT, ND, no HSM. No bruits or masses. +BS  Extremities: no cyanosis, clubbing, rash, TED hose in place. LLE no edema and RLE trace-1+ .  Neuro: alert & orientedx3, cranial nerves grossly intact. moves all 4 extremities w/o difficulty. Affect pleasant GU: + Scrotal edema.   Telemetry: Reviewed personally,  A Fib 100s  Labs: Basic Metabolic Panel:  Recent Labs Lab 03/15/16 0410 03/16/16 0213 03/17/16 0257 03/19/16 0257 03/20/16 0413  NA 137 136 137 137 136  K 4.0 3.9 3.6 3.1* 3.2*  CL 102 105 101 99* 97*  CO2 26 21* GLUCOSE 105* 106* 95 90 111*  BUN 43* 42* 37* 28* 29*  CREATININE 1.74* 1.45* 1.34* 1.19 1.33*  CALCIUM 9.2 9.1 9.4 9.3 9.3    Liver Function Tests: No results for input(s): AST, ALT, ALKPHOS, BILITOT, PROT, ALBUMIN in the last 168 hours. No results for input(s): LIPASE, AMYLASE in the last 168 hours. No results for input(s): AMMONIA in the last 168 hours.  CBC:  Recent Labs Lab 03/13/16 2221 03/14/16 0509 03/15/16 0410 03/16/16 0213 03/17/16 0257  WBC  --  14.6* 17.7* 17.6* 16.6*  HGB 10.5* 10.9* 11.6* 11.7* 11.7*  HCT 31.0* 32.7* 35.4* 35.2* 35.6*  MCV  --  88.9 89.8 90.5 90.8  PLT  --  115* 157 187 263  Cardiac Enzymes: No results for input(s): CKTOTAL, CKMB, CKMBINDEX, TROPONINI in the last 168 hours.  BNP: BNP (last 3 results)  Recent Labs  03/02/16 1421  BNP 513.7*    ProBNP (last 3 results) No results for input(s): PROBNP in the last 8760 hours.    Other results:  Imaging: Dg Chest Port 1 View  03/20/2016  CLINICAL DATA:  Congestive heart failure. EXAM: PORTABLE CHEST 1 VIEW COMPARISON:  March 17, 2016. FINDINGS: Stable cardiomegaly. Status post coronary artery bypass graft. Left-sided chest tube is unchanged in position. Stable small left apical pneumothorax is noted. Stable left basilar opacity is noted concerning for atelectasis or  infiltrate with associated pleural effusion. Stable minimal right pleural effusion is noted. IMPRESSION: Left-sided chest tube is unchanged with stable small left apical pneumothorax. Stable bilateral pleural effusions and left basilar opacity. Electronically Signed   By: Lupita Raider, M.D.   On: 03/20/2016 07:32     Medications:     Scheduled Medications: . amiodarone  200 mg Oral BID  . antiseptic oral rinse  7 mL Mouth Rinse BID  . apixaban  2.5 mg Oral BID  . aspirin EC  325 mg Oral Daily   Or  . aspirin  324 mg Per Tube Daily  . atorvastatin  80 mg Oral q1800  . bisacodyl  10 mg Oral Daily   Or  . bisacodyl  10 mg Rectal Daily  . carvedilol  3.125 mg Oral BID WC  . cephALEXin  500 mg Oral 4 times per day  . docusate sodium  200 mg Oral Daily  . feeding supplement (ENSURE ENLIVE)  237 mL Oral BID BM  . furosemide  80 mg Intravenous BID  . lisinopril  2.5 mg Oral Daily  . pantoprazole  40 mg Oral Daily  . potassium chloride  20 mEq Oral Daily  . sodium chloride flush  3 mL Intravenous Q12H  . spironolactone  25 mg Oral Daily    Infusions: . sodium chloride 10 mL/hr at 03/11/16 2101  . sodium chloride 10 mL/hr at 03/11/16 2101    PRN Medications: sodium chloride, metoprolol, ondansetron (ZOFRAN) IV, oxyCODONE, sodium chloride flush, traMADol   Assessment/Plan/Discussion   [name] [name] is a 75 year old male who has not sought medical care in many years who presented to ED on 03/02/16 with SOB, hypertensive with BP of 220/130, and lower extremity edema. His echo showed EF of 35% with diffuse hypokinesis with akinesis of the basal and midinferolateral walls. Left heart cath on 03/04/16 revealed multivessel disease, he had CABG x4  and left carotid endarterectomy on 03/10/16.   1. CAD S/P 03/10/2016 CABG x4  L CEA On aspirin, statin, and metoprolol tartrate 25 mg twice daily.   2. A/C Systolic  Heart Failure  TEE 03/10/2016 EF 30-35%. Volume status improved. Renal function  trending up.  - Stop IV lasix and start po lasix 40 mg daily. Supplement K  - Continue carvedilol 3.125 mg twice a day. No room to titrate with soft BP.  - Continue lisinopril 2.5 mg at bed time.  - Continue 25 mg spiro daily.   - Continue ted hose for peripheral edema. 3. A fib - EKG on 3/29 NSR but now in A fib for the last few days. Had L atrial appendage clipped this admit  On amio 200 mg twice a day. Considered increasing but with nausea I will hold off. On low dose  Eliquis 2.5 mg twice a day per Dr Alla German.  Need to increase eliquis to 5 mg as he meets criteria ( Age <80, weight >60 Kg, creatinine < 1.5) when ok with Dr Maren BeachVantrigt. Last MT coming out today.   - May eventually need DCCV if he remains in Afib but would need 5 doses of 5 mg of eliquis.  4. AKI- Creatinine peaked 2.6 >1.3 > 1.14>1.3 bump noted. Cutting back diuretics as above.  5. Dysuria - check UA/ urine culture. WBC elevated . Afebrile.  6. Deconditioning- PT following with recommendations for HHPT.   Repeat ECHO pending  Length of Stay: 18  Amy Clegg NP-C  03/20/2016, 7:44 AM  Advanced Heart Failure Team Pager [phone] (M-F; 7a - 4p)  Please contact [facility] for night-coverage after hours (4p -7a ) and weekends on [email or web address]  Patient seen with NP, agree with the above note.  Echo today showed EF 25-30%.  He remains in atrial fibrillation.  He has diuresed well and weight is down.  - Transition to po Lasix.  - Increase Eliquis to 5 mg bid.  After 5 doses (Monday), can do TEE-guided DCCV if he remains in atrial fibrillation.  - Continue current Coreg, lisinopril, and spironolactone.   Marca AnconaDalton Erico Stan 03/20/2016 2:07 PM

## 2016-03-20 NOTE — Progress Notes (Addendum)
      [address]       [place] [id number]             [phone]      10 Days Post-Op Procedure(s) (LRB): CORONARY ARTERY BYPASS GRAFTING (CABG)TIMES 4 USING LEFT INTERNAL MAMMARY AND BILATERAL GREATER SAPHENOUS VEIN HARVESTED BY ENDOVIEN (N/A) TRANSESOPHAGEAL ECHOCARDIOGRAM (TEE) (N/A) CLIPPING OF ATRIAL APPENDAGE (Left) LEFT ENDARTERECTOMY CAROTID DACRON PATCH ANGIOPLASTY (Left)   Subjective:  [name] has no new complaints.  He is ambulating with assistance.  Objective: Vital signs in last 24 hours: Temp:  [97.8 F (36.6 C)-98.1 F (36.7 C)] 98.1 F (36.7 C) (04/07 0457) Pulse Rate:  [108-122] 108 (04/07 0457) Cardiac Rhythm:  [-] Atrial fibrillation (04/06 1900) Resp:  [18-21] 21 (04/07 0457) BP: (94-107)/(74-84) 94/74 mmHg (04/07 0457) SpO2:  [90 %-95 %] 90 % (04/07 0457) Weight:  [151 lb 8 oz (68.72 kg)] 151 lb 8 oz (68.72 kg) (04/07 0457)  Intake/Output from previous day: 04/06 0701 - 04/07 0700 In: 240 [P.O.:240] Out: 2890 [Urine:2800; Chest Tube:90]  General appearance: alert, cooperative and no distress Heart: irregularly irregular rhythm Lungs: diminished breath sounds bibasilar Abdomen: soft, non-tender; bowel sounds normal; no masses,  no organomegaly Extremities: edema trace, ecchymosis RLE Wound: clean and dry  Lab Results: No results for input(s): WBC, HGB, HCT, PLT in the last 72 hours. BMET:  Recent Labs  03/19/16 0257 03/20/16 0413  NA 137 136  K 3.1* 3.2*  CL 99* 97*  CO2 26 29  GLUCOSE 90 111*  BUN 28* 29*  CREATININE 1.19 1.33*  CALCIUM 9.3 9.3    PT/INR: No results for input(s): LABPROT, INR in the last 72 hours. ABG    Component Value Date/Time   PHART 7.356 03/12/2016 0400   HCO3 24.7* 03/12/2016 1622   TCO2 20 03/13/2016 1602   ACIDBASEDEF 2.0 03/12/2016 1622   O2SAT 63.4 03/13/2016 0449   CBG (last 3)  No results for input(s): GLUCAP in the last 72 hours.  Assessment/Plan: S/P Procedure(s)  (LRB): CORONARY ARTERY BYPASS GRAFTING (CABG)TIMES 4 USING LEFT INTERNAL MAMMARY AND BILATERAL GREATER SAPHENOUS VEIN HARVESTED BY ENDOVIEN (N/A) TRANSESOPHAGEAL ECHOCARDIOGRAM (TEE) (N/A) CLIPPING OF ATRIAL APPENDAGE (Left) LEFT ENDARTERECTOMY CAROTID DACRON PATCH ANGIOPLASTY (Left)  1. CV- A. Fib, BP a little labile- Amiodarone, Coreg, Lisinopril 2. Pulm- no acute issues, small bilateral effusions, CT output 90 cc - will d/c chest tube this morning 3. Renal- creatinine slowly trending up, at 1.33, continue diuresis per HF 4. Dispo- A. Fib chronic, d/c chest tube, continue diuresis per HF team   LOS: 18 days    [name] [name], [name] [name] 03/20/2016  Remove chest tube today Increase Eliquis  5 mg twice a day and probable cardioversion next week Responding well to increase diuresis patient examined and medical record reviewed,agree with above note. Kathlee Nationseter Van Trigt III 03/20/2016

## 2016-03-20 NOTE — Progress Notes (Signed)
  Echocardiogram 2D Echocardiogram has been performed.  Tye SavoyCasey N Travers Goodley 03/20/2016, 11:36 AM

## 2016-03-20 NOTE — Progress Notes (Signed)
Utilization review completed.  

## 2016-03-20 NOTE — Care Management Note (Signed)
Case Management Note Donn PieriniKristi Ayana Imhof RN, BSN Unit 2W-Case Manager [phone]  Patient Details  Name: [name] [name] MRN: [id number] Date of Birth: 10-15-1941  Subjective/Objective:     Pt s/p CABG x4 hx HF               Action/Plan: PTA pt lived at home with wife- anticipate return home- referral received for insurance check on Eliquis- benefits check completed and pt has $47 per 30 days copay with no pre-auth needed- orders have been placed for HH-RN/PT/OT and DME- RW-- in to speak with pt at bedside- per conversation pt states that he does not feel like he needs HH therapy and states that he does not want a HH nurse to check on him- he would prefer to go the doctor's office if needed- explained the benefits of what a HH RN could do - pt still politely declines HH services at this time- informed pt that if he changes his mind to let staff know and services could still be arranged prior to discharge. - Explained benefit coverage for Eliquis- and pt given 30 day free card- [facility].   Expected Discharge Date:                  Expected Discharge Plan:  Home w Home Health Services  In-House Referral:     Discharge planning Services  CM Consult, Medication Assistance  Post Acute Care Choice:  Durable Medical Equipment, Home Health Choice offered to:     DME Arranged:  Walker rolling DME Agency:  [facility]  [facility]     Status of Service:  In process, will continue to follow  Medicare Important Message Given:  Yes Date Medicare IM Given:    Medicare IM give by:    Date Additional Medicare IM Given:    Additional Medicare Important Message give by:     If discussed at Long Length of Stay Meetings, dates discussed:    Additional Comments:  Darrold SpanWebster, Cordelia Bessinger Hall, RN 03/20/2016, 2:12 PM

## 2016-03-20 NOTE — Progress Notes (Signed)
CARDIAC REHAB PHASE I   PRE:  Rate/Rhythm: 107 afib    BP: sitting 91/68    SaO2: 92 RA  MODE:  Ambulation: 300 ft   POST:  Rate/Rhythm: 106 afib    BP: sitting 97/67     SaO2: 94 RA  Pt struggled to get from lying to EOB. Needed mod assist. Stands and walks independently with RW. Did not want to go as far as yesterday, asked for "2 laps". To recliner and set up his lunch. Will f/u tomorrow. [phone]   Harriet MassonRandi Kristan Vishwa Dais CES, ACSM 03/20/2016 2:44 PM

## 2016-03-20 NOTE — Discharge Summary (Signed)
Physician Discharge Summary  Patient ID: [name] [name] MRN: [id number] DOB/AGE: [birth year] 75 y.o.  Admit date: 03/02/2016 Discharge date: 03/24/2016  Admission Diagnoses:  Patient Active Problem List   Diagnosis Date Noted  . CAD (coronary artery disease)   . PAD (peripheral artery disease) (HCC)   . Bilateral carotid artery disease (HCC)   . Essential hypertension   . CAD in native artery   . Bruit   . Acute systolic and diastolic CHF (congestive heart failure), NYHA class 1 (HCC) 03/03/2016  . Hypertensive urgency 03/03/2016  . Hypokalemia 03/03/2016  . Bilateral pleural effusion 03/03/2016  . Acute combined systolic and diastolic heart failure (HCC)   . Hypertensive heart disease with heart failure [facility](HCC)    Discharge Diagnoses:   Patient Active Problem List   Diagnosis Date Noted  . Atrial fibrillation (HCC) 03/21/2016  . S/P CABG x 4 03/10/2016  . CAD (coronary artery disease)   . PAD (peripheral artery disease) (HCC)   . Bilateral carotid artery disease (HCC)   . Essential hypertension   . CAD in native artery   . Bruit   . Acute systolic and diastolic CHF (congestive heart failure), NYHA class 1 (HCC) 03/03/2016  . Hypertensive urgency 03/03/2016  . Hypokalemia 03/03/2016  . Bilateral pleural effusion 03/03/2016  . Acute combined systolic and diastolic heart failure (HCC)   . Hypertensive heart disease with heart failure [facility](HCC)    Discharged Condition: good  History of Present Illness:  [name] [name] is a 75 yo male without previous medical history.  He presented to a Primary care physician with weakness and shortness of breath with exertion.  Workup found the patient to be Hypertensive and he was sent [facility] for [facility] treatment.  Workup in the ED found the patient to also be suffering from CHF.  Echocardiogram was obtained and showed a reduced EF of 35-40% with a grade 2 diastolic dysfunction with diffuse hypokinesis with akinesis of the basal and mid  inferolateral walls and no valvular abnormalities.  Further workup with cardiac catheterization was done and showed severe multivessel CAD and it was felt coronary bypass grafting would be indicated and TCTS consult was obtained.  He was evaluated by Dr. Donata ClayVan Trigt who was in agreement the patient would require bypass surgery.  The risks and benefits of the procedure were explained to the patient and he was agreeable to proceed.  Cardiac preoperative workup showed Carotid Stenosis and Vascular surgery consult was obtained.  It was felt he should have a Carotid Endarterectomy at the time of surgery.    Hospital Course:   [name] [name] was taken to the operating room on 03/10/2016.  He underwent CABG x 4 using LIMA to LAD, SVG to OM, SVG to PD and SVG to the Diagonal.  He also underwent Clipping of the Left Atrial Appendage, Left Carotid Endarterectomy with Dacron Patch Angioplasty, and endoscopic harvest of the greater saphenous vein from both thighs.  He tolerated the procedure without difficulty and was taken to the SICU in stable condition.  During his stay in the SICU the patient was extubated on POD #1.  He was weaned off Milrinone and Dopamine as tolerated.  He was transfused packed cells for post operative blood loss anemia.  He had some mild upper extremity weakness, but was otherwise neurologically intact.  He complained of food and pills getting stuck with eating.  Speech and swallow evaluation was obtained and they recommended the patient be placed on a dysphagia 3  diet.  He initially maintained NSR, but ultimately converted to Atrial Fibrillation which was chronic for him.  He is not a candidate for anticoagulation.  He had some acute renal insufficiency.  His creatinine peaked at 1.74 and he was kept off all Nephrotoxic agents.  His mediastinal chest tubes and arterial lines were removed without difficulty.  He was felt medically stable for transfer to the step down unit on POD #6.  The patient continues  to make slow progress.  He remains in chronic Atrial Fibrillation.  His pacing wires were removed without difficulty.  His creatinine level continued to trend down and is currently at 1.9.  He remained hypervolemic with lower extremity edema.  He was aggressively diuresed with Lasix, Zaroxolyn, and Spironolactone.  Heart Failure team was consulted for assistance with management of fluid balance.  He was started on Keflex for cellulitis of RLE.  His final chest tube was removed on POD #10.  He continued to make progress.  He remains in chronic Atrial Fibrillation. He will be cardioverted as an outpatient.   He is ambulating with minimal assistance and home health arrangements have been made.  He is felt medically stable for discharge home today.               Consults: cardiology  Significant Diagnostic Studies:   RPDA lesion, 95% stenosed.  Prox RCA lesion, 80% stenosed.  Dist RCA lesion, 100% stenosed.  LM lesion, 50% stenosed.  Ost 1st Diag to 1st Diag lesion, 50% stenosed.  Prox LAD lesion, 75% stenosed.  1st Mrg lesion, 100% stenosed.  There is moderate left ventricular systolic dysfunction.  Severe multivessel CAD with coronary calcification and 50% stenosis in the proximal LAD and diagonal vessel followed by a napkin ring 75% focal stenosis between the first and second diagonal branch; 50% proximal circumflex stenosis with total occlusion of the OM1 vessel with retrograde collaterals arising from the LAD; and total occlusion of the mid RCA with 95% stenosis in the acute margin branch, and evidence for collaterals to the distal RCA via the distal circumflex and distal LAD.  Mild to moderate elevation of right heart pressures.  Moderately severe LV dysfunction with more pronounced inferior hypocontractility and EF of 35% consistent with an ischemic cardiomyopathy.  Treatments: surgery:   1. Coronary artery bypass grafting x4 (left internal mammary artery to LAD, saphenous vein  graft to first diagonal, saphenous vein graft to obtuse marginal, saphenous vein graft to posterolateral branch  of the right coronary artery). 2. Endoscopic harvest of right greater saphenous vein and endoscopic  harvest of left greater saphenous vein.  Disposition:Home with Home Health  Discharge Medications:  The patient has been discharged on:   1.Beta Blocker:  Yes [ x  ]                              No   [   ]                              If No, reason:  2.Ace Inhibitor/ARB: Yes [x   ]                                     No  [ x   ]  If No, reason:renal insuff  3.Statin:   Yes [ x  ]                  No  [   ]                  If No, reason:  4.Ecasa:  Yes  [ x  ]                  No   [   ]                  If No, reason:      Discharge Instructions    Amb Referral to Cardiac Rehabilitation    Complete by:  As directed   Diagnosis:  CABG  CABG X ___:  4            Medication List    STOP taking these medications        Fish Oil 1000 MG Caps      TAKE these medications        amiodarone 200 MG tablet  Commonly known as:  PACERONE  Take 1 tablet (200 mg total) by mouth daily.     apixaban 5 MG Tabs tablet  Commonly known as:  ELIQUIS  Take 1 tablet (5 mg total) by mouth 2 (two) times daily.     aspirin 81 MG tablet  Take 81 mg by mouth every Monday, Wednesday, and Friday.     atorvastatin 80 MG tablet  Commonly known as:  LIPITOR  Take 1 tablet (80 mg total) by mouth daily at 6 PM.     carvedilol 3.125 MG tablet  Commonly known as:  COREG  Take 1 tablet (3.125 mg total) by mouth 2 (two) times daily with a meal.     cephALEXin 500 MG capsule  Commonly known as:  KEFLEX  Take 1 capsule (500 mg total) by mouth 4 (four) times daily.     furosemide 40 MG tablet  Commonly known as:  LASIX  Take 1 tablet (40 mg total) by mouth daily.     magnesium 30 MG tablet  Take 30 mg by mouth daily.     oxyCODONE  5 MG immediate release tablet  Commonly known as:  Oxy IR/ROXICODONE  Take 1-2 tablets (5-10 mg total) by mouth every 6 (six) hours as needed for severe pain.     pantoprazole 40 MG tablet  Commonly known as:  PROTONIX  Take 1 tablet (40 mg total) by mouth daily.     potassium chloride 10 MEQ tablet  Commonly known as:  K-DUR  Take 1 tablet (10 mEq total) by mouth daily.     vitamin C 1000 MG tablet  Take 1,000 mg by mouth daily.       Follow-up Information    Follow up with Kerin Perna III, MD On 04/15/2016.   Specialty:  Cardiothoracic Surgery   Why:  Appointment is at 10:30   Contact information:   [address] [employer] [address] [place] [address] [phone]       Follow up with [facility] On 04/15/2016.   Why:  Please get CXR at 10:00   Contact information:   [place]       Follow up with [facility]   Why:  rolling walker arranged- to be delivered to room prior to discharge   Contact information:   [address] [place]  [address] [place] [address] [phone]       Follow up with Lennette Bihari, MD.   Specialty:  Cardiology   Why:  04/07/16 at 8:30 am for cardiology follow-up   Contact information:   [address] [place] [address] [phone]       Signed: Rowe Clack 03/24/2016, 7:46 AM

## 2016-03-21 ENCOUNTER — Encounter (HOSPITAL_COMMUNITY): Payer: Self-pay | Admitting: Cardiology

## 2016-03-21 ENCOUNTER — Inpatient Hospital Stay (HOSPITAL_COMMUNITY): Payer: Commercial Managed Care - HMO

## 2016-03-21 DIAGNOSIS — I4891 Unspecified atrial fibrillation: Secondary | ICD-10-CM

## 2016-03-21 DIAGNOSIS — I10 Essential (primary) hypertension: Secondary | ICD-10-CM

## 2016-03-21 DIAGNOSIS — I481 Persistent atrial fibrillation: Secondary | ICD-10-CM

## 2016-03-21 HISTORY — DX: Unspecified atrial fibrillation: I48.91

## 2016-03-21 LAB — BASIC METABOLIC PANEL
Anion gap: 13 (ref 5–15)
BUN: 33 mg/dL — AB (ref 6–20)
CHLORIDE: 96 mmol/L — AB (ref 101–111)
CO2: 26 mmol/L (ref 22–32)
CREATININE: 1.58 mg/dL — AB (ref 0.61–1.24)
Calcium: 9.1 mg/dL (ref 8.9–10.3)
GFR calc Af Amer: 48 mL/min — ABNORMAL LOW (ref >60)
GFR calc non Af Amer: 41 mL/min — ABNORMAL LOW (ref >60)
Glucose, Bld: 85 mg/dL (ref 65–99)
Potassium: 3.7 mmol/L (ref 3.5–5.1)
Sodium: 135 mmol/L (ref 135–145)

## 2016-03-21 NOTE — Progress Notes (Signed)
CARDIAC REHAB PHASE I   PRE:  Rate/Rhythm: 98 Afib  BP:  Supine:   Sitting: 87/73  Standing:    SaO2: 95% RA  MODE:  Ambulation: 350 ft   POST:  Rate/Rhythm: 110 Afib  BP:  Supine:   Sitting: 103/74  Standing:    SaO2: 97% RA  [id number] Patient tolerated ambulation well with assist x1 and pushing rolling walker. Gait slow, steady, no c/o, VSS. To chair after walk with legs elevated. Initiated [facility] education including IS use, sternal precautions, restrictions, and risk factor modification. Pt verbalized understanding of instructions given.  Artist Paislinty M Eddy Termine, MS, ACSM CCEP

## 2016-03-21 NOTE — Progress Notes (Signed)
SUBJECTIVE:  Up walking in hall.  No complaints  OBJECTIVE:   Vitals:   Filed Vitals:   03/20/16 0958 03/20/16 1500 03/20/16 2103 03/21/16 0600  BP: 94/64 97/67 98/72  91/52  Pulse: 103 102 100 106  Temp:  98 F (36.7 C) 98 F (36.7 C) 98.3 F (36.8 C)  TempSrc:  Oral Oral Oral  Resp:  20 20 18   Height:      Weight:    151 lb 0.2 oz (68.5 kg)  SpO2:  96% 95% 94%   I&O's:   Intake/Output Summary (Last 24 hours) at 03/21/16 0853 Last data filed at 03/21/16 0630  Gross per 24 hour  Intake      0 ml  Output    650 ml  Net   -650 ml   TELEMETRY: Reviewed telemetry pt in atrial fibrillation:     PHYSICAL EXAM General: Well developed, well nourished, in no acute distress Head: Eyes PERRLA, No xanthomas.   Normal cephalic and atramatic  Lungs:   Clear bilaterally to auscultation and percussion. Heart:   Irregularly irregular S1 S2 Pulses are 2+ & equal. Abdomen: Bowel sounds are positive, abdomen soft and non-tender without masses  Extremities:   No clubbing, cyanosis or edema.  DP +1 Neuro: Alert and oriented X 3. Psych:  Good affect, responds appropriately   LABS: Basic Metabolic Panel:  Recent Labs  16/01/09 0413 03/21/16 0452  NA 136 135  K 3.2* 3.7  CL 97* 96*  CO2 29 26  GLUCOSE 111* 85  BUN 29* 33*  CREATININE 1.33* 1.58*  CALCIUM 9.3 9.1   Liver Function Tests: No results for input(s): AST, ALT, ALKPHOS, BILITOT, PROT, ALBUMIN in the last 72 hours. No results for input(s): LIPASE, AMYLASE in the last 72 hours. CBC: No results for input(s): WBC, NEUTROABS, HGB, HCT, MCV, PLT in the last 72 hours. Cardiac Enzymes: No results for input(s): CKTOTAL, CKMB, CKMBINDEX, TROPONINI in the last 72 hours. BNP: Invalid input(s): POCBNP D-Dimer: No results for input(s): DDIMER in the last 72 hours. Hemoglobin A1C: No results for input(s): HGBA1C in the last 72 hours. Fasting Lipid Panel: No results for input(s): CHOL, HDL, LDLCALC, TRIG, CHOLHDL, LDLDIRECT  in the last 72 hours. Thyroid Function Tests: No results for input(s): TSH, T4TOTAL, T3FREE, THYROIDAB in the last 72 hours.  Invalid input(s): FREET3 Anemia Panel: No results for input(s): VITAMINB12, FOLATE, FERRITIN, TIBC, IRON, RETICCTPCT in the last 72 hours. Coag Panel:   Lab Results  Component Value Date   INR 2.00* 03/10/2016   INR 1.09 03/04/2016    RADIOLOGY: Dg Chest 2 View  03/17/2016  CLINICAL DATA:  Post CABG procedure. EXAM: CHEST  2 VIEW COMPARISON:  03/16/2016 FINDINGS: Left chest tube in a stable position. Evidence for a small left apical pneumothorax. Patchy densities at the left lung base are most compatible with areas of atelectasis. Probable small right pleural effusion. Postsurgical changes from a CABG procedure and clipping of the left atrial appendage. No evidence for pulmonary edema. Heart size is stable. IMPRESSION: Small left apical pneumothorax.  Chest tube is still in place. Small amount of pleural fluid, probably on the right side. Left basilar atelectasis. Electronically Signed   By: Richarda Overlie M.D.   On: 03/17/2016 08:13   Dg Chest 2 View  03/02/2016  CLINICAL DATA:  Shortness of breath for 2 weeks.  Initial encounter. EXAM: CHEST  2 VIEW COMPARISON:  None. FINDINGS: The patient has small bilateral pleural effusions and basilar airspace  disease, both worse on the right. Heart size is upper normal. No pneumothorax is identified. IMPRESSION: Right greater than left basilar airspace disease could be due to atelectasis or pneumonia. Small pleural effusions are also seen, greater on the right. Electronically Signed   By: Drusilla Kanner M.D.   On: 03/02/2016 15:20   Ct Angio Chest Pe W/cm &/or Wo Cm  03/02/2016  CLINICAL DATA:  Shortness of breath for 1 week EXAM: CT ANGIOGRAPHY CHEST WITH CONTRAST TECHNIQUE: Multidetector CT imaging of the chest was performed using the standard protocol during bolus administration of intravenous contrast. Multiplanar CT image  reconstructions and MIPs were obtained to evaluate the vascular anatomy. CONTRAST:  80mL OMNIPAQUE IOHEXOL 350 MG/ML SOLN COMPARISON:  Plain film from earlier in the same day FINDINGS: Lungs are well aerated bilaterally and demonstrate evidence of bilateral large pleural effusions right greater than left. Associated partial consolidation of the lower lobes is noted bilaterally. The thoracic inlet is within normal limits. Calcific changes of the thoracic aorta are noted without aneurysmal dilatation or dissection. The pulmonary artery shows a normal branching pattern. No filling defects to suggest pulmonary emboli are identified. No significant hilar or mediastinal adenopathy is noted. Scanning into the upper abdomen reveals no acute abnormality. The osseous structures show degenerative change of the thoracic spine. No acute bony abnormality is noted. Review of the MIP images confirms the above findings. IMPRESSION: Large bilateral pleural effusions with associated partial consolidation in the lower lobes. No evidence of pulmonary emboli. Electronically Signed   By: Alcide Clever M.D.   On: 03/02/2016 15:55   Dg Chest Port 1 View  03/20/2016  CLINICAL DATA:  Congestive heart failure. EXAM: PORTABLE CHEST 1 VIEW COMPARISON:  March 17, 2016. FINDINGS: Stable cardiomegaly. Status post coronary artery bypass graft. Left-sided chest tube is unchanged in position. Stable small left apical pneumothorax is noted. Stable left basilar opacity is noted concerning for atelectasis or infiltrate with associated pleural effusion. Stable minimal right pleural effusion is noted. IMPRESSION: Left-sided chest tube is unchanged with stable small left apical pneumothorax. Stable bilateral pleural effusions and left basilar opacity. Electronically Signed   By: Lupita Raider, M.D.   On: 03/20/2016 07:32   Dg Chest Port 1 View  03/16/2016  CLINICAL DATA:  Followup congestive heart failure. Atelectasis. Postop from CABG. EXAM: PORTABLE  CHEST 1 VIEW COMPARISON:  03/15/2016 FINDINGS: Left chest tube remains in place. No pneumothorax identified. Cardiomegaly is stable as well as pulmonary vascular congestion. Small bilateral pleural effusions and bibasilar atelectasis also show no significant change. IMPRESSION: No significant change in small bilateral pleural effusions and bibasilar atelectasis. No pneumothorax visualized. Electronically Signed   By: Myles Rosenthal M.D.   On: 03/16/2016 07:45   Dg Chest Port 1 View  03/15/2016  CLINICAL DATA:  Status post CABG. EXAM: PORTABLE CHEST 1 VIEW COMPARISON:  03/14/2016 and prior exams FINDINGS: Cardiomegaly, CABG changes, right IJ central venous catheter sheath and left thoracostomy tube again noted. A tiny left apical pneumothorax (less than 5%) is unchanged. Improved bibasilar aeration noted with mild left basilar atelectasis and trace pleural effusions present. There is no evidence of edema. IMPRESSION: Improved bibasilar aeration, otherwise unchanged appearance of the chest. Tiny left apical pneumothorax again noted. Electronically Signed   By: Harmon Pier M.D.   On: 03/15/2016 07:43   Dg Chest Port 1 View  03/14/2016  CLINICAL DATA:  Shortness of breath. Congestive heart failure. Postop from CABG. EXAM: PORTABLE CHEST 1 VIEW COMPARISON:  03/13/2016 FINDINGS: Left chest tube and right jugular Cordis remain in place. A small approximately 5% left apical pneumothorax is seen. Bibasilar atelectasis and probable small bilateral pleural effusions show no significant change. Cardiomegaly is stable. IMPRESSION: Tiny approximately 5% left apical pneumothorax with left chest tube in place. No significant change in bibasilar atelectasis and probable small bilateral pleural effusions. Electronically Signed   By: Myles Rosenthal M.D.   On: 03/14/2016 07:15   Dg Chest Port 1 View  03/13/2016  CLINICAL DATA:  Shortness of breath EXAM: PORTABLE CHEST 1 VIEW COMPARISON:  03/12/2016; 03/10/2016; 03/02/2016; chest CT-  03/02/2016 FINDINGS: Grossly unchanged cardiac silhouette and mediastinal contours post median sternotomy and CABG. Interval removal of right jugular approach PA catheter with remaining vascular sheath tip projected over the superior aspect of the SVC. Otherwise, stable position of support apparatus. No pneumothorax. Grossly unchanged bibasilar heterogeneous opacities, left greater than right, favored to represent atelectasis or scar. There is persistent blunting of bilateral costophrenic angles suggestive of trace bilateral effusions. Mild pulmonary venous congestion without frank evidence of edema. No acute osseus abnormalities. IMPRESSION: 1. Interval removal of right jugular approach PA catheter with remaining vascular sheath tip overlying the superior SVC. Otherwise, stable position of support apparatus. No pneumothorax. 2. Grossly unchanged trace bilateral effusions and associated bibasilar opacities, likely atelectasis, left greater than right. 3. No definite evidence of edema. Electronically Signed   By: Simonne Come M.D.   On: 03/13/2016 07:51   Dg Chest Port 1 View  03/12/2016  CLINICAL DATA:  Status post CABG on March 10, 2016 EXAM: PORTABLE CHEST 1 VIEW COMPARISON:  Portable chest x-ray of March 11, 2016 FINDINGS: The right lung is well-expanded. A small amount of pleural fluid layers posteriorly on the right. On the left the pulmonary interstitium is clearer. No pneumothorax is evident. A small left pleural effusion is present. The cardiac silhouette remains enlarged. The pulmonary vascularity is less engorged. The left chest tube is in stable position stable position as is the mediastinal drain. The pericardial drain has been removed. The Swan-Ganz catheter tip projects in the distal portion of the main pulmonary artery. The endotracheal tube and esophagogastric tubes have been removed. IMPRESSION: Interval extubation of the trachea and esophagus and removal of the pericardial drain. Improved  appearance of the pulmonary vascularity and pulmonary interstitium. There is no pneumothorax nor large pleural effusion. Electronically Signed   By: [name]  [place] M.D.   On: 03/12/2016 07:59   Dg Chest Port 1 View  03/11/2016  CLINICAL DATA:  Status post cardiac Sir EXAM: PORTABLE CHEST 1 VIEW COMPARISON:  03/10/2016 FINDINGS: Endotracheal tube, nasogastric catheter and Swan-Ganz catheter are again identified and stable in appearance. Postsurgical changes are again seen. A mediastinal drain, left thoracostomy catheter and pericardial drain are noted. Minimal left basilar atelectasis is noted. Some slight haziness is noted in the bases likely related to posteriorly layering effusions. No pneumothorax is noted. IMPRESSION: Tubes and lines as described. Likely small pleural effusions and left basilar atelectasis. Electronically Signed   By: Alcide Clever M.D.   On: 03/11/2016 07:39   Dg Chest Port 1 View  03/10/2016  CLINICAL DATA:  Status post CABG EXAM: PORTABLE CHEST 1 VIEW COMPARISON:  03/02/2016 chest radiograph. FINDINGS: Endotracheal tube tip is 4.7 cm above the carina. Right internal jugular Swan-Ganz catheter terminates over the main pulmonary artery. Sternotomy wires appear aligned and intact. Enteric tube terminates in the gastric fundus. Two mediastinal drains and left chest tube are in place.  Stable cardiomediastinal silhouette with top-normal heart size. No pneumothorax. No pleural effusion. Mild bibasilar atelectasis. No overt pulmonary edema. IMPRESSION: 1. Well-positioned support hardware as described.  No pneumothorax . 2. Mild bibasilar atelectasis. Electronically Signed   By: Delbert PhenixJason A Poff M.D.   On: 03/10/2016 19:09    Assessment/Plan/Discussion   [name] [name] is a 75 year old male who has not sought medical care in many years who presented to ED on 03/02/16 with SOB, hypertensive with BP of 220/130, and lower extremity edema. His echo showed EF of 35% with diffuse hypokinesis with akinesis  of the basal and midinferolateral walls. Left heart cath on 03/04/16 revealed multivessel disease, he had CABG x4 and left carotid endarterectomy on 03/10/16.   1. CAD S/P 03/10/2016 CABG x4 L CEA On aspirin, statin, and metoprolol tartrate 25 mg twice daily.   2. A/C Systolic Heart Failure  TEE 03/10/2016 EF 30-35%. Volume status improved. Renal function trending up.  - Now on po lasix 40 mg daily. Supplement K as needed - Continue carvedilol 3.125 mg twice a day. No room to titrate with soft BP.  - Continue lisinopril 2.5 mg at bed time.  - Continue 25 mg spiro daily.  - Continue ted hose for peripheral edema.  3. A fib - EKG on 3/29 NSR but now in A fib for the last few days. Had L atrial appendage clipped this admit On amio 200 mg twice a day. Considered increasing but with nausea  will hold off.  Eliquis increased to 5mg  BID.  Will plan TEE guided DCCV on Monday.  4. AKI- Creatinine peaked 2.6 >1.3 > 1.14>1.3>1.58 bump noted. Changed to PO diuretics as above yesterday.          Quintella ReichertURNER,[name] Belanger [name], [name] [name]  03/21/2016  8:53 AM

## 2016-03-21 NOTE — Progress Notes (Addendum)
      [address]       [place] [id number]             [phone]        11 Days Post-Op Procedure(s) (LRB): CORONARY ARTERY BYPASS GRAFTING (CABG)TIMES 4 USING LEFT INTERNAL MAMMARY AND BILATERAL GREATER SAPHENOUS VEIN HARVESTED BY ENDOVIEN (N/A) TRANSESOPHAGEAL ECHOCARDIOGRAM (TEE) (N/A) CLIPPING OF ATRIAL APPENDAGE (Left) LEFT ENDARTERECTOMY CAROTID DACRON PATCH ANGIOPLASTY (Left)  Subjective: Patient disappointed cardioversion will not be done until Monday.  Objective: Vital signs in last 24 hours: Temp:  [98 F (36.7 C)-98.3 F (36.8 C)] 98.3 F (36.8 C) (04/08 0600) Pulse Rate:  [100-106] 106 (04/08 0600) Cardiac Rhythm:  [-] Atrial fibrillation (04/08 0700) Resp:  [18-20] 18 (04/08 0600) BP: (91-98)/(52-72) 91/52 mmHg (04/08 0600) SpO2:  [94 %-96 %] 94 % (04/08 0600) Weight:  [151 lb 0.2 oz (68.5 kg)] 151 lb 0.2 oz (68.5 kg) (04/08 0600)  Pre op weight 67 kg Current Weight  03/21/16 151 lb 0.2 oz (68.5 kg)      Intake/Output from previous day: 04/07 0701 - 04/08 0700 In: -  Out: 650 [Urine:650]   Physical Exam:  Cardiovascular: Crecencio McIRRR IRRR Pulmonary: Clear to auscultation bilaterally; no rales, wheezes, or rhonchi. Abdomen: Soft, non tender, bowel sounds present. Extremities: Ted hose in place. Wounds: Clean and dry.  No erythema or signs of infection.  Lab Results: CBC:No results for input(s): WBC, HGB, HCT, PLT in the last 72 hours. BMET:  Recent Labs  03/20/16 0413 03/21/16 0452  NA 136 135  K 3.2* 3.7  CL 97* 96*  CO2 29 26  GLUCOSE 111* 85  BUN 29* 33*  CREATININE 1.33* 1.58*  CALCIUM 9.3 9.1    PT/INR:  Lab Results  Component Value Date   INR 2.00* 03/10/2016   INR 1.09 03/04/2016   ABG:  INR: Will add last result for INR, ABG once components are confirmed Will add last 4 CBG results once components are confirmed  Assessment/Plan:  1. CV - A fib with HR in the low 100's. On Amiodarone 200 mg daily, Coreg  3.125 mg bid, Spironolactone 25 mg daily, Lisinopril 2.5 mg daily, and Eliquis 5 mg bid. For TEE guided DCCV Monday. 2.  Pulmonary - On room air. CXR appears to show cardiomegaly, trace left apical pneumothorax, small left pleural effusion and atelectasis. Encourage incentive spirometer. 3. Volume Overload - On Lasix 40 mg daily. Per heart failure team 4.  Acute blood loss anemia - Last H an H stable at 11.7 and 35.6 5. Supplement potassium 6. Creatinine slightly increased from 1.33 to 1.58  ZIMMERMAN,DONIELLE MPA-C 03/21/2016,8:49 AM  Patient examined and above note reviewed-agree with impression and plan Continue amiodarone 200 mg daily because of GI side effects-it may help hold sinus rhythm after cardioversion Chest tube out, chest x-ray today clear Peripheral edema improving with Lasix 40 mg daily Follow up  Slight increase in creatinine

## 2016-03-22 LAB — BASIC METABOLIC PANEL
Anion gap: 10 (ref 5–15)
BUN: 36 mg/dL — AB (ref 6–20)
CHLORIDE: 96 mmol/L — AB (ref 101–111)
CO2: 29 mmol/L (ref 22–32)
CREATININE: 2.04 mg/dL — AB (ref 0.61–1.24)
Calcium: 9.1 mg/dL (ref 8.9–10.3)
GFR calc Af Amer: 35 mL/min — ABNORMAL LOW (ref >60)
GFR calc non Af Amer: 30 mL/min — ABNORMAL LOW (ref >60)
Glucose, Bld: 106 mg/dL — ABNORMAL HIGH (ref 65–99)
POTASSIUM: 4 mmol/L (ref 3.5–5.1)
Sodium: 135 mmol/L (ref 135–145)

## 2016-03-22 MED ORDER — TRAMADOL HCL 50 MG PO TABS: 50.0000 mg | ORAL_TABLET | ORAL | Status: DC | PRN

## 2016-03-22 NOTE — Progress Notes (Addendum)
      [address]       [place] [id number]             [phone]        12 Days Post-Op Procedure(s) (LRB): CORONARY ARTERY BYPASS GRAFTING (CABG)TIMES 4 USING LEFT INTERNAL MAMMARY AND BILATERAL GREATER SAPHENOUS VEIN HARVESTED BY ENDOVIEN (N/A) TRANSESOPHAGEAL ECHOCARDIOGRAM (TEE) (N/A) CLIPPING OF ATRIAL APPENDAGE (Left) LEFT ENDARTERECTOMY CAROTID DACRON PATCH ANGIOPLASTY (Left)  Subjective: Patient resting in chair. No complaints.  Objective: Vital signs in last 24 hours: Temp:  [97.5 F (36.4 C)-97.8 F (36.6 C)] 97.5 F (36.4 C) (04/09 0318) Pulse Rate:  [80-104] 104 (04/09 0629) Cardiac Rhythm:  [-] Atrial fibrillation (04/09 0700) Resp:  [16-18] 18 (04/09 0318) BP: (101-130)/(53-82) 101/53 mmHg (04/09 0318) SpO2:  [93 %-97 %] 93 % (04/09 0318) Weight:  [152 lb 11.2 oz (69.264 kg)] 152 lb 11.2 oz (69.264 kg) (04/09 0318)  Pre op weight 67 kg Current Weight  03/22/16 152 lb 11.2 oz (69.264 kg)      Intake/Output from previous day: 04/08 0701 - 04/09 0700 In: -  Out: 325 [Urine:325]   Physical Exam:  Cardiovascular: IRRR IRRR Pulmonary: Slightly diminished at bases. Abdomen: Soft, non tender, bowel sounds present. Extremities: Ted hose in place. Wounds: Clean and dry.  No erythema or signs of infection.  Lab Results: CBC:No results for input(s): WBC, HGB, HCT, PLT in the last 72 hours. BMET:   Recent Labs  03/21/16 0452 03/22/16 0310  NA 135 135  K 3.7 4.0  CL 96* 96*  CO2 26 29  GLUCOSE 85 106*  BUN 33* 36*  CREATININE 1.58* 2.04*  CALCIUM 9.1 9.1    PT/INR:  Lab Results  Component Value Date   INR 2.00* 03/10/2016   INR 1.09 03/04/2016   ABG:  INR: Will add last result for INR, ABG once components are confirmed Will add last 4 CBG results once components are confirmed  Assessment/Plan:  1. CV - A fib with HR in the low 100's. On Amiodarone 200 mg daily, Coreg 3.125 mg bid, Spironolactone 25 mg daily,  Lisinopril 2.5 mg daily, and Eliquis 5 mg bid. For TEE guided DCCV in am. 2.  Pulmonary - On room air.  Encourage incentive spirometer. 3. Volume Overload - On Lasix 40 mg daily.  4.  Acute blood loss anemia - Last H an H stable at 11.7 and 35.6. Check CBC am 5. Creatinine slightly increased from 1.58 to 2.04. Will stop Lisinopril and Lasix. Dr. Mayford Knifeurner also stopped Spironolactone. Remove foley. Recheck creatinine in am.  ZIMMERMAN,DONIELLE MPA-C 03/22/2016,8:10 AM  DC cardioversion plan in a.m. with TEE Plan discharge home Tuesday on Eliquis , will probably need follow-up in heart failure clinic because of high risk of readmission for heart failure patient examined and medical record reviewed,agree with above note. Kathlee Nationseter Van Trigt III 03/22/2016

## 2016-03-22 NOTE — Progress Notes (Signed)
SUBJECTIVE:  No complaints  OBJECTIVE:   Vitals:   Filed Vitals:   03/21/16 1532 03/21/16 1958 03/22/16 0318 03/22/16 0629  BP: 104/58 130/82 101/53   Pulse: 90 80 90 104  Temp: 97.5 F (36.4 C) 97.8 F (36.6 C) 97.5 F (36.4 C)   TempSrc: Oral Oral Oral   Resp: Height:      Weight:   152 lb 11.2 oz (69.264 kg)   SpO2: 96% 97% 93%    I&O's:   Intake/Output Summary (Last 24 hours) at 03/22/16 0831 Last data filed at 03/22/16 0330  Gross per 24 hour  Intake      0 ml  Output    325 ml  Net   -325 ml   TELEMETRY: Reviewed telemetry pt in atrial fibrillation:     PHYSICAL EXAM General: Well developed, well nourished, in no acute distress Head: Eyes PERRLA, No xanthomas.   Normal cephalic and atramatic  Lungs:   Clear bilaterally to auscultation and percussion. Heart:   Irregularly irregular S1 S2 Pulses are 2+ & equal. Abdomen: Bowel sounds are positive, abdomen soft and non-tender without masses Extremities:   No clubbing, cyanosis or edema.  DP +1 Neuro: Alert and oriented X 3. Psych:  Good affect, responds appropriately   LABS: Basic Metabolic Panel:  Recent Labs  82/95/62 0452 03/22/16 0310  NA 135 135  K 3.7 4.0  CL 96* 96*  CO2 26 29  GLUCOSE 85 106*  BUN 33* 36*  CREATININE 1.58* 2.04*  CALCIUM 9.1 9.1   Liver Function Tests: No results for input(s): AST, ALT, ALKPHOS, BILITOT, PROT, ALBUMIN in the last 72 hours. No results for input(s): LIPASE, AMYLASE in the last 72 hours. CBC: No results for input(s): WBC, NEUTROABS, HGB, HCT, MCV, PLT in the last 72 hours. Cardiac Enzymes: No results for input(s): CKTOTAL, CKMB, CKMBINDEX, TROPONINI in the last 72 hours. BNP: Invalid input(s): POCBNP D-Dimer: No results for input(s): DDIMER in the last 72 hours. Hemoglobin A1C: No results for input(s): HGBA1C in the last 72 hours. Fasting Lipid Panel: No results for input(s): CHOL, HDL, LDLCALC, TRIG, CHOLHDL, LDLDIRECT in the last 72  hours. Thyroid Function Tests: No results for input(s): TSH, T4TOTAL, T3FREE, THYROIDAB in the last 72 hours.  Invalid input(s): FREET3 Anemia Panel: No results for input(s): VITAMINB12, FOLATE, FERRITIN, TIBC, IRON, RETICCTPCT in the last 72 hours. Coag Panel:   Lab Results  Component Value Date   INR 2.00* 03/10/2016   INR 1.09 03/04/2016    RADIOLOGY: Dg Chest 2 View  03/21/2016  CLINICAL DATA:  CABG and left atrial appendage clipping on 03/10/2016. Followup left pneumothorax after chest tube removal. EXAM: CHEST  2 VIEW COMPARISON:  03/20/2016 and earlier. FINDINGS: Sternotomy for CABG and left atrial clipping. Cardiac silhouette moderately enlarged, unchanged. Residual small (5% or so) left apical pneumothorax after left chest tube removal. Left pleural effusion and associated left lower lobe consolidation, unchanged. Mild pulmonary venous hypertension without overt edema, unchanged. No new pulmonary parenchymal abnormalities. IMPRESSION: 1. Stable small (5% or so) left apical pneumothorax after chest tube removal. 2. Stable small left pleural effusion and associated passive atelectasis and/or pneumonia in the left lower lobe. 3. Stable cardiomegaly and mild pulmonary venous hypertension without overt edema. 4. No new abnormalities. Electronically Signed   By: Hulan Saas M.D.   On: 03/21/2016 10:00   Dg Chest 2 View  03/17/2016  CLINICAL DATA:  Post CABG procedure. EXAM: CHEST  2 VIEW COMPARISON:  03/16/2016 FINDINGS: Left chest tube in a stable position. Evidence for a small left apical pneumothorax. Patchy densities at the left lung base are most compatible with areas of atelectasis. Probable small right pleural effusion. Postsurgical changes from a CABG procedure and clipping of the left atrial appendage. No evidence for pulmonary edema. Heart size is stable. IMPRESSION: Small left apical pneumothorax.  Chest tube is still in place. Small amount of pleural fluid, probably on the right  side. Left basilar atelectasis. Electronically Signed   By: Richarda Overlie M.D.   On: 03/17/2016 08:13   Dg Chest 2 View  03/02/2016  CLINICAL DATA:  Shortness of breath for 2 weeks.  Initial encounter. EXAM: CHEST  2 VIEW COMPARISON:  None. FINDINGS: The patient has small bilateral pleural effusions and basilar airspace disease, both worse on the right. Heart size is upper normal. No pneumothorax is identified. IMPRESSION: Right greater than left basilar airspace disease could be due to atelectasis or pneumonia. Small pleural effusions are also seen, greater on the right. Electronically Signed   By: Drusilla Kanner M.D.   On: 03/02/2016 15:20   Ct Angio Chest Pe W/cm &/or Wo Cm  03/02/2016  CLINICAL DATA:  Shortness of breath for 1 week EXAM: CT ANGIOGRAPHY CHEST WITH CONTRAST TECHNIQUE: Multidetector CT imaging of the chest was performed using the standard protocol during bolus administration of intravenous contrast. Multiplanar CT image reconstructions and MIPs were obtained to evaluate the vascular anatomy. CONTRAST:  80mL OMNIPAQUE IOHEXOL 350 MG/ML SOLN COMPARISON:  Plain film from earlier in the same day FINDINGS: Lungs are well aerated bilaterally and demonstrate evidence of bilateral large pleural effusions right greater than left. Associated partial consolidation of the lower lobes is noted bilaterally. The thoracic inlet is within normal limits. Calcific changes of the thoracic aorta are noted without aneurysmal dilatation or dissection. The pulmonary artery shows a normal branching pattern. No filling defects to suggest pulmonary emboli are identified. No significant hilar or mediastinal adenopathy is noted. Scanning into the upper abdomen reveals no acute abnormality. The osseous structures show degenerative change of the thoracic spine. No acute bony abnormality is noted. Review of the MIP images confirms the above findings. IMPRESSION: Large bilateral pleural effusions with associated partial  consolidation in the lower lobes. No evidence of pulmonary emboli. Electronically Signed   By: Alcide Clever M.D.   On: 03/02/2016 15:55   Dg Chest Port 1 View  03/20/2016  CLINICAL DATA:  Congestive heart failure. EXAM: PORTABLE CHEST 1 VIEW COMPARISON:  March 17, 2016. FINDINGS: Stable cardiomegaly. Status post coronary artery bypass graft. Left-sided chest tube is unchanged in position. Stable small left apical pneumothorax is noted. Stable left basilar opacity is noted concerning for atelectasis or infiltrate with associated pleural effusion. Stable minimal right pleural effusion is noted. IMPRESSION: Left-sided chest tube is unchanged with stable small left apical pneumothorax. Stable bilateral pleural effusions and left basilar opacity. Electronically Signed   By: Lupita Raider, M.D.   On: 03/20/2016 07:32   Dg Chest Port 1 View  03/16/2016  CLINICAL DATA:  Followup congestive heart failure. Atelectasis. Postop from CABG. EXAM: PORTABLE CHEST 1 VIEW COMPARISON:  03/15/2016 FINDINGS: Left chest tube remains in place. No pneumothorax identified. Cardiomegaly is stable as well as pulmonary vascular congestion. Small bilateral pleural effusions and bibasilar atelectasis also show no significant change. IMPRESSION: No significant change in small bilateral pleural effusions and bibasilar atelectasis. No pneumothorax visualized. Electronically Signed   By: Jonny Ruiz  Eppie GibsonStahl M.D.   On: 03/16/2016 07:45   Dg Chest Port 1 View  03/15/2016  CLINICAL DATA:  Status post CABG. EXAM: PORTABLE CHEST 1 VIEW COMPARISON:  03/14/2016 and prior exams FINDINGS: Cardiomegaly, CABG changes, right IJ central venous catheter sheath and left thoracostomy tube again noted. A tiny left apical pneumothorax (less than 5%) is unchanged. Improved bibasilar aeration noted with mild left basilar atelectasis and trace pleural effusions present. There is no evidence of edema. IMPRESSION: Improved bibasilar aeration, otherwise unchanged appearance  of the chest. Tiny left apical pneumothorax again noted. Electronically Signed   By: Harmon PierJeffrey  Hu M.D.   On: 03/15/2016 07:43   Dg Chest Port 1 View  03/14/2016  CLINICAL DATA:  Shortness of breath. Congestive heart failure. Postop from CABG. EXAM: PORTABLE CHEST 1 VIEW COMPARISON:  03/13/2016 FINDINGS: Left chest tube and right jugular Cordis remain in place. A small approximately 5% left apical pneumothorax is seen. Bibasilar atelectasis and probable small bilateral pleural effusions show no significant change. Cardiomegaly is stable. IMPRESSION: Tiny approximately 5% left apical pneumothorax with left chest tube in place. No significant change in bibasilar atelectasis and probable small bilateral pleural effusions. Electronically Signed   By: Myles RosenthalJohn  Stahl M.D.   On: 03/14/2016 07:15   Dg Chest Port 1 View  03/13/2016  CLINICAL DATA:  Shortness of breath EXAM: PORTABLE CHEST 1 VIEW COMPARISON:  03/12/2016; 03/10/2016; 03/02/2016; chest CT- 03/02/2016 FINDINGS: Grossly unchanged cardiac silhouette and mediastinal contours post median sternotomy and CABG. Interval removal of right jugular approach PA catheter with remaining vascular sheath tip projected over the superior aspect of the SVC. Otherwise, stable position of support apparatus. No pneumothorax. Grossly unchanged bibasilar heterogeneous opacities, left greater than right, favored to represent atelectasis or scar. There is persistent blunting of bilateral costophrenic angles suggestive of trace bilateral effusions. Mild pulmonary venous congestion without frank evidence of edema. No acute osseus abnormalities. IMPRESSION: 1. Interval removal of right jugular approach PA catheter with remaining vascular sheath tip overlying the superior SVC. Otherwise, stable position of support apparatus. No pneumothorax. 2. Grossly unchanged trace bilateral effusions and associated bibasilar opacities, likely atelectasis, left greater than right. 3. No definite evidence  of edema. Electronically Signed   By: Simonne ComeJohn  Watts M.D.   On: 03/13/2016 07:51   Dg Chest Port 1 View  03/12/2016  CLINICAL DATA:  Status post CABG on March 10, 2016 EXAM: PORTABLE CHEST 1 VIEW COMPARISON:  Portable chest x-ray of March 11, 2016 FINDINGS: The right lung is well-expanded. A small amount of pleural fluid layers posteriorly on the right. On the left the pulmonary interstitium is clearer. No pneumothorax is evident. A small left pleural effusion is present. The cardiac silhouette remains enlarged. The pulmonary vascularity is less engorged. The left chest tube is in stable position stable position as is the mediastinal drain. The pericardial drain has been removed. The Swan-Ganz catheter tip projects in the distal portion of the main pulmonary artery. The endotracheal tube and esophagogastric tubes have been removed. IMPRESSION: Interval extubation of the trachea and esophagus and removal of the pericardial drain. Improved appearance of the pulmonary vascularity and pulmonary interstitium. There is no pneumothorax nor large pleural effusion. Electronically Signed   By: [name]  SwazilandJordan M.D.   On: 03/12/2016 07:59   Dg Chest Port 1 View  03/11/2016  CLINICAL DATA:  Status post cardiac Sir EXAM: PORTABLE CHEST 1 VIEW COMPARISON:  03/10/2016 FINDINGS: Endotracheal tube, nasogastric catheter and Swan-Ganz catheter are again identified and stable in  appearance. Postsurgical changes are again seen. A mediastinal drain, left thoracostomy catheter and pericardial drain are noted. Minimal left basilar atelectasis is noted. Some slight haziness is noted in the bases likely related to posteriorly layering effusions. No pneumothorax is noted. IMPRESSION: Tubes and lines as described. Likely small pleural effusions and left basilar atelectasis. Electronically Signed   By: Alcide Clever M.D.   On: 03/11/2016 07:39   Dg Chest Port 1 View  03/10/2016  CLINICAL DATA:  Status post CABG EXAM: PORTABLE CHEST 1 VIEW  COMPARISON:  03/02/2016 chest radiograph. FINDINGS: Endotracheal tube tip is 4.7 cm above the carina. Right internal jugular Swan-Ganz catheter terminates over the main pulmonary artery. Sternotomy wires appear aligned and intact. Enteric tube terminates in the gastric fundus. Two mediastinal drains and left chest tube are in place. Stable cardiomediastinal silhouette with top-normal heart size. No pneumothorax. No pleural effusion. Mild bibasilar atelectasis. No overt pulmonary edema. IMPRESSION: 1. Well-positioned support hardware as described.  No pneumothorax . 2. Mild bibasilar atelectasis. Electronically Signed   By: Delbert Phenix M.D.   On: 03/10/2016 19:09    Assessment/Plan/Discussion   Mr. Corkery is a 75 year old male who has not sought medical care in many years who presented to ED on 03/02/16 with SOB, hypertensive with BP of 220/130, and lower extremity edema. His echo showed EF of 35% with diffuse hypokinesis with akinesis of the basal and midinferolateral walls. Left heart cath on 03/04/16 revealed multivessel disease, he had CABG x4 and left carotid endarterectomy on 03/10/16.   1. CAD S/P 03/10/2016 CABG x4 L CEA On aspirin, statin, and metoprolol tartrate 25 mg twice daily.   2. A/C Systolic Heart Failure  TEE 03/10/2016 EF 30-35%. 2D echo this admit showed EF 25-30%.  Volume status improved. Renal function trending up.  - Hold lasix to day due to creatinine bumped to 2 - Continue carvedilol 3.125 mg twice a day. No room to titrate with soft BP.  - Hold ACE I due to bumped creatinine - Hold aldactone - Continue ted hose for peripheral edema.  3. A fib - EKG on 3/29 NSR but now in A fib for the last few days. Had L atrial appendage clipped this admit On amio 200 mg twice a day. Considered increasing but with nausea will hold off. Eliquis is at 5mg  BID which is ok despite Creatinine of 2 (age < 80 and weight > 60kg). Will plan TEE guided DCCV on Monday.  4. AKI-  Creatinine peaked 2.6 >1.3 > 1.14>1.3>1.58>2.04.  Will hold diureticstoday and repeat BMET in am.            Quintella Reichert, MD  03/22/2016  8:31 AM

## 2016-03-23 DIAGNOSIS — I480 Paroxysmal atrial fibrillation: Secondary | ICD-10-CM

## 2016-03-23 LAB — BASIC METABOLIC PANEL
Anion gap: 9 (ref 5–15)
BUN: 39 mg/dL — ABNORMAL HIGH (ref 6–20)
CHLORIDE: 96 mmol/L — AB (ref 101–111)
CO2: 29 mmol/L (ref 22–32)
CREATININE: 2.01 mg/dL — AB (ref 0.61–1.24)
Calcium: 9.2 mg/dL (ref 8.9–10.3)
GFR, EST AFRICAN AMERICAN: 36 mL/min — AB (ref >60)
GFR, EST NON AFRICAN AMERICAN: 31 mL/min — AB (ref >60)
Glucose, Bld: 96 mg/dL (ref 65–99)
POTASSIUM: 4.6 mmol/L (ref 3.5–5.1)
SODIUM: 134 mmol/L — AB (ref 135–145)

## 2016-03-23 LAB — CBC
HCT: 31.8 % — ABNORMAL LOW (ref 39.0–52.0)
Hemoglobin: 10.3 g/dL — ABNORMAL LOW (ref 13.0–17.0)
MCH: 30.7 pg (ref 26.0–34.0)
MCHC: 32.4 g/dL (ref 30.0–36.0)
MCV: 94.9 fL (ref 78.0–100.0)
PLATELETS: 372 10*3/uL (ref 150–400)
RBC: 3.35 MIL/uL — AB (ref 4.22–5.81)
RDW: 16.1 % — ABNORMAL HIGH (ref 11.5–15.5)
WBC: 17.1 10*3/uL — AB (ref 4.0–10.5)

## 2016-03-23 MED ORDER — SODIUM CHLORIDE 0.9% FLUSH
3.0000 mL | Freq: Two times a day (BID) | INTRAVENOUS | Status: DC
  Administered 2016-03-23 – 2016-03-24 (×2): 3 mL via INTRAVENOUS

## 2016-03-23 MED ORDER — SODIUM CHLORIDE 0.9 % IV SOLN: 250.0000 mL | INTRAVENOUS | Status: DC

## 2016-03-23 MED ORDER — SODIUM CHLORIDE 0.9% FLUSH: 3.0000 mL | INTRAVENOUS | Status: DC | PRN

## 2016-03-23 MED ORDER — FUROSEMIDE 40 MG PO TABS
40.0000 mg | ORAL_TABLET | Freq: Every day | ORAL | Status: DC
  Administered 2016-03-23 – 2016-03-24 (×2): 40 mg via ORAL
  Filled 2016-03-23 (×2): qty 1

## 2016-03-23 NOTE — Progress Notes (Signed)
Advanced Heart Failure Rounding Note   Subjective:   [name] is a 75 year old male who has not sought medical care in many years who presented to ED on 03/02/16 with SOB, hypertensive with BP of 220/130, and lower extremity edema. His echo 03/03/2016 showed EF of 35% with diffuse hypokinesis with akinesis of the basal and midinferolateral walls. Left heart cath on 03/04/16 revealed multivessel disease, he is S/P CABG, Atrial clipping,  L carotid endarterectomy on 03/10/16.   Post operatively he has been doing ok but has significant peripheral edema. Also had AKI with creatinine peak at 2.6 after surgery and then back down to 1.34. 03/18/16 metolazone and spiro added to 40 mg po lasix. Pacing wires removed 03/17/16.   Over the weekend diuretics, spiro, and lisinopril stopped due to worsening renal function. Creatinine 1.58>2.04>2.01.   Overall feeling ok. Denies pain/nausea.    Objective:   Weight Range:  Vital Signs:   Temp:  [98.3 F (36.8 C)-98.4 F (36.9 C)] 98.4 F (36.9 C) (04/10 0510) Pulse Rate:  [81-87] 81 (04/10 0510) Resp:  [18] 18 (04/10 0510) BP: (108-136)/(56-60) 136/59 mmHg (04/10 0510) SpO2:  [94 %-100 %] 96 % (04/10 0510) Weight:  [153 lb (69.4 kg)] 153 lb (69.4 kg) (04/10 0510) Last BM Date: 03/19/16  Weight change: Filed Weights   03/21/16 0600 03/22/16 0318 03/23/16 0510  Weight: 151 lb 0.2 oz (68.5 kg) 152 lb 11.2 oz (69.264 kg) 153 lb (69.4 kg)    Intake/Output:   Intake/Output Summary (Last 24 hours) at 03/23/16 0737 Last data filed at 03/22/16 2300  Gross per 24 hour  Intake    720 ml  Output    600 ml  Net    120 ml     Physical Exam: General:  Pale No resp difficulty. In bed  HEENT: normal Neck: supple. JVP ~9-10 TR. Carotids 2+ bilat; no bruits. No thyromegaly or nodule noted. L lateral neck incision approximated, C/D/I. R neck ecchymotic.  Cor: PMI nondisplaced. Irregular rate & rhythm. No M/G/R appreciated. Mediastinal tube. Sternal  incision CDI.  Lungs: CTAB, normal effort  Abdomen: soft, NT, ND, no HSM. No bruits or masses. +BS  Extremities: no cyanosis, clubbing, rash, TED hose in place. LLE no edema and RLE trace Neuro: alert & orientedx3, cranial nerves grossly intact. moves all 4 extremities w/o difficulty. Affect pleasant    Telemetry: Reviewed personally,  A Fib 70s  Labs: Basic Metabolic Panel:  Recent Labs Lab 03/19/16 0257 03/20/16 0413 03/21/16 0452 03/22/16 0310 03/23/16 0250  NA 137 136 135 135 134*  K 3.1* 3.2* 3.7 4.0 4.6  CL 99* 97* 96* 96* 96*  CO2 26 29 26 29 29   GLUCOSE 90 111* 85 106* 96  BUN 28* 29* 33* 36* 39*  CREATININE 1.19 1.33* 1.58* 2.04* 2.01*  CALCIUM 9.3 9.3 9.1 9.1 9.2    Liver Function Tests: No results for input(s): AST, ALT, ALKPHOS, BILITOT, PROT, ALBUMIN in the last 168 hours. No results for input(s): LIPASE, AMYLASE in the last 168 hours. No results for input(s): AMMONIA in the last 168 hours.  CBC:  Recent Labs Lab 03/17/16 0257 03/23/16 0250  WBC 16.6* 17.1*  HGB 11.7* 10.3*  HCT 35.6* 31.8*  MCV 90.8 94.9  PLT 263 372    Cardiac Enzymes: No results for input(s): CKTOTAL, CKMB, CKMBINDEX, TROPONINI in the last 168 hours.  BNP: BNP (last 3 results)  Recent Labs  03/02/16 1421  BNP 513.7*  ProBNP (last 3 results) No results for input(s): PROBNP in the last 8760 hours.    Other results:  Imaging: No results found.   Medications:     Scheduled Medications: . amiodarone  200 mg Oral Daily  . antiseptic oral rinse  7 mL Mouth Rinse BID  . apixaban  5 mg Oral BID  . aspirin EC  81 mg Oral Daily  . atorvastatin  80 mg Oral q1800  . carvedilol  3.125 mg Oral BID WC  . cephALEXin  500 mg Oral 4 times per day  . feeding supplement (ENSURE ENLIVE)  237 mL Oral BID BM  . pantoprazole  40 mg Oral Daily  . potassium chloride  40 mEq Oral Daily  . sodium chloride flush  3 mL Intravenous Q12H    Infusions: . sodium chloride 10 mL/hr  at 03/11/16 2101  . sodium chloride 10 mL/hr at 03/11/16 2101    PRN Medications: sodium chloride, ondansetron (ZOFRAN) IV, oxyCODONE, sodium chloride flush, traMADol   Assessment/Plan/Discussion   [name] [name] is a 75 year old male who has not sought medical care in many years who presented to ED on 03/02/16 with SOB, hypertensive with BP of 220/130, and lower extremity edema. His echo showed EF of 35% with diffuse hypokinesis with akinesis of the basal and midinferolateral walls. Left heart cath on 03/04/16 revealed multivessel disease, he had CABG x4  and left carotid endarterectomy on 03/10/16.   1. CAD S/P 03/10/2016 CABG x4  L CEA On aspirin, statin, and carvedilol.    2. A/C Systolic  Heart Failure  TEE 03/10/2016 EF 30-35%.  ECHO 03/19/2016 EF 25-30%. RA and LA mod-severey dilated. Volume status improved. Renal function leveled off at 2.0. Keep off diuretics for now.   - Off diuretics with worsening renal function but JVP up .  Give 40 mg po lasix today.  - Continue carvedilol 3.125 mg twice a day. No room to titrate with soft BP.  - off ace and spiro with worsening renal function.   -  IF SBP ok later today can add hydralazine/imdur. Hold off for now.  - Continue ted hose for peripheral edema. 3. A fib - EKG on 3/29 NSR but has been in A fib for the last several day. Had L atrial appendage clipped this admit  On amio 200 mg twice a day.  Not sure he can get TEE- DC-CV today. Rate controlled. Its reasonable to follow as an outpatient for 30 days and if A fib persists DC_CV as an outpatient. Continue eliquis 5 mg twice a day. ( Age <80, weight >60 Kg, creatinine < 1.5)  4. AKI- Creatinine peaked 2.6 >1.3 > 1.14>1.3>1.58>2.01>2.04  Off ace and spiro  5. Dysuria - UA negative. Urine culture insignificant growth. . WBC 17.1  Afebrile.  6. Deconditioning- PT following with recommendations for HHPT.     Length of Stay: 21  Amy Clegg NP-C  03/23/2016, 7:37 AM  Advanced Heart Failure  Team Pager [phone] (M-F; 7a - 4p)  Please contact [facility] for night-coverage after hours (4p -7a ) and weekends on [email or web address]  Patient seen and examined with Tonye Becket, NP. We discussed all aspects of the encounter. I agree with the assessment and plan as stated above.   Feels better. JVD up slightly will resume lasix 40 daily. Remains in AF with controlled rate. No room on TEE/DC-CV schedule today (or tomorrow). Will try to make a slot. He is insistent on going home tomorrow even if  unable to have DC-CV. He is tolerating AF well and is rate controlled so I would be fine with outpatient DC-CV as needed. Hold ACE until renal function improves.   Anberlin Diez,MD 9:27 AM

## 2016-03-23 NOTE — Care Management Note (Signed)
Case Management Note Donn PieriniKristi Sallee Hogrefe RN, BSN Unit 2W-Case Manager [phone]  Patient Details  Name: [name] [name] MRN: [id number] Date of Birth: Jun 15, 1941  Subjective/Objective:     Pt s/p CABG x4 hx HF               Action/Plan: PTA pt lived at home with wife- anticipate return home- referral received for insurance check on Eliquis- benefits check completed and pt has $47 per 30 days copay with no pre-auth needed- orders have been placed for HH-RN/PT/OT and DME- RW-- in to speak with pt at bedside- per conversation pt states that he does not feel like he needs HH therapy and states that he does not want a HH nurse to check on him- he would prefer to go the doctor's office if needed- explained the benefits of what a HH RN could do - pt still politely declines HH services at this time- informed pt that if he changes his mind to let staff know and services could still be arranged prior to discharge. - Explained benefit coverage for Eliquis- and pt given 30 day free card- [facility].   Expected Discharge Date:    03/24/16              Expected Discharge Plan:  Home w Home Health Services  In-House Referral:     Discharge planning Services  CM Consult, Medication Assistance  Post Acute Care Choice:  Durable Medical Equipment, Home Health Choice offered to:     DME Arranged:  Walker rolling DME Agency:  [facility]  HH Arranged:  RN, PT, OT [facility]:     Status of Service:  In process, will continue to follow  Medicare Important Message Given:  Yes Date Medicare IM Given:    Medicare IM give by:    Date Additional Medicare IM Given:    Additional Medicare Important Message give by:     If discussed at Long Length of Stay Meetings, dates discussed:  03/24/16  Additional Comments:  03/23/16- 1100- Donn PieriniKristi Hema Lanza RN, BSN- f/u done with pt and his wife at bedside regarding d/c needs and HH- per conversation pt  still politely declines any HH services- and wife states that she was in healthcare in her home country and feels like she can look after things with her husband and call doctor if needed. Again explained the benefits of home health and what HH would do. Both pt and wife strongly feel like they do not need any HH services or follow up in the home and [facility].   Darrold SpanWebster, Mahayla Haddaway Hall, RN 03/23/2016, 11:30 AM

## 2016-03-23 NOTE — Progress Notes (Signed)
Patient sitting up in chair Was agitated earlier that his cardioversion was not done. Is refusing to get it done tomorrow and is adamant about going home. Call light within reach

## 2016-03-23 NOTE — Progress Notes (Signed)
CARDIAC REHAB PHASE I   PRE:  Rate/Rhythm: 89 a.fib  BP:  Sitting: 119/67        SaO2: 95 RA  MODE:  Ambulation: 350 ft   POST:  Rate/Rhythm: 129 a. fib  BP:  Sitting: 157/71         SaO2: 94 RA  Pt states he "cancelled his cardioversion." Pt states "I don't care I'm leaving here tomorrow." Pt agreeable to walk. Pt ambulated 350 ft on RA, rolling walker, assist x1, slow, steady gait, tolerated well. Reviewed exercise guidelines, heart healthy diet, sodium restrictions and phase 2 cardiac rehab. Pt verbalized understanding. Pt agrees to phase 2 cardiac rehab referral, [facility]. Pt to recliner after walk, feet elevated, call bell within reach. Will follow.   [phone] Joylene GrapesEmily C Sofia Jaquith, RN, BSN 03/23/2016 2:05 PM

## 2016-03-23 NOTE — Progress Notes (Signed)
Patient had 5 beats of Vtach. Patient sitting up in chair asymptomatic. No pain. No SOB. VSS. Will continue to monitor. Wife at bedside.  Valinda HoarLexie Lala Been RN

## 2016-03-23 NOTE — Progress Notes (Signed)
Physical Therapy Treatment Patient Details Name: [name] [name] MRN: [id number] DOB: 01/09/1941 Today's Date: 03/23/2016    History of Present Illness Patient is a 75 y/o male who does not regularly seek medical care who presents with SOB and was found to have acute systolic and diastolic heart failure. Pt s/p CABG, CLIPPING OF ATRIAL APPENDAGE and LEFT ENDARTERECTOMY CAROTID     PT Comments    Pt pleasant and agreeable to PT intervention.  Pt progressed gait distance with decreased assist.  Pt required x1 standing break during gait training.  Pt able to recall 3/3 sternal precautions.    Follow Up Recommendations  Home health PT;Supervision/Assistance - 24 hour     Equipment Recommendations  Rolling walker with 5" wheels    Recommendations for Other Services       Precautions / Restrictions Precautions Precaution Comments: Pt able to recall and maintain sternal precautions.   Restrictions Weight Bearing Restrictions: Yes (sternal precautions) Other Position/Activity Restrictions: sternal precautions    Mobility  Bed Mobility Overal bed mobility: Needs Assistance Bed Mobility: Supine to Sit     Supine to sit: Min assist (to elevate trunk into sitting in upright position edge of bed.  Pt has difficulty due to decreased use of UEs. )        Transfers Overall transfer level: Needs assistance Equipment used: Rolling walker (2 wheeled) Transfers: Sit to/from Stand Sit to Stand: Supervision         General transfer comment: Pt performed with no physical assistance and cues provided for sternal precautions to ensure safety.  Pt use heart pillow during stand to sit.    Ambulation/Gait Ambulation/Gait assistance: Supervision Ambulation Distance (Feet): 450 Feet Assistive device: Rolling walker (2 wheeled) Gait Pattern/deviations: Step-through pattern;Trunk flexed Gait velocity: decreased   General Gait Details: Cues for upper trunk control to improve forward gaze.  HR  117 bpm.  Pt required x1 standing break.     Stairs            Wheelchair Mobility    Modified Rankin (Stroke Patients Only)       Balance     Sitting balance-Leahy Scale: Good       Standing balance-Leahy Scale: Good                      Cognition Arousal/Alertness: Awake/alert Behavior During Therapy: WFL for tasks assessed/performed Overall Cognitive Status: Within Functional Limits for tasks assessed                      Exercises Other Exercises Other Exercises: attempted to educate pt on LE therapeutic exercise and pt reports," I know I know."    General Comments        Pertinent Vitals/Pain Pain Assessment: No/denies pain    Home Living                      Prior Function            PT Goals (current goals can now be found in the care plan section) Acute Rehab PT Goals Patient Stated Goal: return home with wife Potential to Achieve Goals: Good Progress towards PT goals: Progressing toward goals    Frequency  Min 3X/week    PT Plan Current plan remains appropriate    Co-evaluation             End of Session Equipment Utilized During Treatment: Gait belt Activity Tolerance: Patient tolerated treatment  well Patient left: in chair;with call bell/phone within reach     Time: 0915-0936 PT Time Calculation (min) (ACUTE ONLY): 21 min  Charges:  $Gait Training: 8-22 mins                    G Codes:      [name] [name] 03/23/2016, 9:43 AM  Joycelyn RuaAimee Odesser Tourangeau, PTA pager [phone]

## 2016-03-23 NOTE — Progress Notes (Addendum)
      [address]       [place] [id number]             [phone]      13 Days Post-Op Procedure(s) (LRB): CORONARY ARTERY BYPASS GRAFTING (CABG)TIMES 4 USING LEFT INTERNAL MAMMARY AND BILATERAL GREATER SAPHENOUS VEIN HARVESTED BY ENDOVIEN (N/A) TRANSESOPHAGEAL ECHOCARDIOGRAM (TEE) (N/A) CLIPPING OF ATRIAL APPENDAGE (Left) LEFT ENDARTERECTOMY CAROTID DACRON PATCH ANGIOPLASTY (Left) Subjective: Feels better, ambulation improving, not SOB  Objective: Vital signs in last 24 hours: Temp:  [98.3 F (36.8 C)-98.4 F (36.9 C)] 98.4 F (36.9 C) (04/10 0510) Pulse Rate:  [81-87] 81 (04/10 0510) Cardiac Rhythm:  [-] Atrial fibrillation (04/09 2136) Resp:  [18] 18 (04/10 0510) BP: (108-136)/(56-60) 136/59 mmHg (04/10 0510) SpO2:  [94 %-100 %] 96 % (04/10 0510) Weight:  [153 lb (69.4 kg)] 153 lb (69.4 kg) (04/10 0510)  Hemodynamic parameters for last 24 hours:    Intake/Output from previous day: 04/09 0701 - 04/10 0700 In: 720 [P.O.:720] Out: 600 [Urine:600] Intake/Output this shift:    General appearance: alert, cooperative and no distress Heart: irregularly irregular rhythm Lungs: dim Left>right base Abdomen: benign Extremities: minor edema Wound: incis healing well  Lab Results:  Recent Labs  03/23/16 0250  WBC 17.1*  HGB 10.3*  HCT 31.8*  PLT 372   BMET:  Recent Labs  03/22/16 0310 03/23/16 0250  NA 135 134*  K 4.0 4.6  CL 96* 96*  CO2 29 29  GLUCOSE 106* 96  BUN 36* 39*  CREATININE 2.04* 2.01*  CALCIUM 9.1 9.2    PT/INR: No results for input(s): LABPROT, INR in the last 72 hours. ABG    Component Value Date/Time   PHART 7.356 03/12/2016 0400   HCO3 24.7* 03/12/2016 1622   TCO2 20 03/13/2016 1602   ACIDBASEDEF 2.0 03/12/2016 1622   O2SAT 63.4 03/13/2016 0449   CBG (last 3)  No results for input(s): GLUCAP in the last 72 hours.  Meds Scheduled Meds: . amiodarone  200 mg Oral Daily  . antiseptic oral rinse  7 mL Mouth  Rinse BID  . apixaban  5 mg Oral BID  . aspirin EC  81 mg Oral Daily  . atorvastatin  80 mg Oral q1800  . carvedilol  3.125 mg Oral BID WC  . cephALEXin  500 mg Oral 4 times per day  . feeding supplement (ENSURE ENLIVE)  237 mL Oral BID BM  . pantoprazole  40 mg Oral Daily  . potassium chloride  40 mEq Oral Daily  . sodium chloride flush  3 mL Intravenous Q12H   Continuous Infusions: . sodium chloride 10 mL/hr at 03/11/16 2101  . sodium chloride 10 mL/hr at 03/11/16 2101   PRN Meds:.sodium chloride, ondansetron (ZOFRAN) IV, oxyCODONE, sodium chloride flush, traMADol  Xrays No results found.  Assessment/Plan: S/P Procedure(s) (LRB): CORONARY ARTERY BYPASS GRAFTING (CABG)TIMES 4 USING LEFT INTERNAL MAMMARY AND BILATERAL GREATER SAPHENOUS VEIN HARVESTED BY ENDOVIEN (N/A) TRANSESOPHAGEAL ECHOCARDIOGRAM (TEE) (N/A) CLIPPING OF ATRIAL APPENDAGE (Left) LEFT ENDARTERECTOMY CAROTID DACRON PATCH ANGIOPLASTY (Left)   1 hemodyn stable with rate controlled afib- for DCCV today 2 Creat stable at 2 3 lasix on hold- AHF team assisting management  LOS: 21 days    GOLD,WAYNE E 03/23/2016  Patient can go home tomorrow with cardiology follow-up in 3 weeks to set up outpatient cardioversion. I'm not confident that cardioversion would take place tomorrow because of staffing issues.

## 2016-03-23 NOTE — Progress Notes (Signed)
Occupational Therapy Treatment Patient Details Name: [name] [name] MRN: [id number] DOB: 08/14/1941 Today's Date: 03/23/2016    History of present illness Patient is a 75 y.o. male who does not regularly seek medical care who presents with SOB and was found to have acute systolic and diastolic heart failure. Pt s/p CABG, CLIPPING OF ATRIAL APPENDAGE and LEFT ENDARTERECTOMY CAROTID    OT comments  Pt progressing and education provided in session to pt and spouse. Updated d/crecommendation to No OT follow up. OT signing off. Pt will have wife to assist.  Follow Up Recommendations  No OT follow up;Supervision/Assistance - 24 hour    Equipment Recommendations  3 in 1 bedside comode    Recommendations for Other Services      Precautions / Restrictions Precautions Precautions: Fall;Sternal Precaution Comments: reviewed sternal precautions Restrictions Weight Bearing Restrictions: Yes (sternal precautions) Other Position/Activity Restrictions: sternal precautions       Mobility Bed Mobility   General bed mobility comments: not assessed-pt in chair  Transfers Overall transfer level: Needs assistance Transfers: Sit to/from Stand Sit to Stand: Supervision (and set up for RW)         General transfer comment: RW in front of pt; cues for technique/sternal precauiton    Balance     Used RW for ambulation.                     ADL Overall ADL's : Needs assistance/impaired     Grooming: Wash/dry face;Oral care;Brushing hair;Standing;Supervision/safety Grooming Details (indicate cue type and reason): pt retrieved washcloth and took to sink with supervision; other items already at sink level; overall supervision level               Lower Body Dressing Details (indicate cue type and reason): pt able to reach both feet while sitting Toilet Transfer: Supervision/safety;Set up;Ambulation;RW (set up for RW; sit to stand from chair)           Functional mobility during  ADLs: Supervision/safety;Rolling walker (also set up for RW) General ADL Comments: Discussed incorporating sternal precautions into functional activities. Educated on tub transfer techniques and pt/spouse did not feel need to practice this. Educated on safety such as safe footwear, rugs/items on floor, sitting for LB ADLs, and recommended someone being with him for tub transfer and bathing.       Vision                     Perception     Praxis      Cognition  Awake/Alert Behavior During Therapy: WFL for tasks assessed/performed Overall Cognitive Status: Within Functional Limits for tasks assessed                       Extremity/Trunk Assessment                  Shoulder Instructions       General Comments      Pertinent Vitals/ Pain       Pain Assessment: No/denies pain  Home Living                                          Prior Functioning/Environment              Frequency Min 2X/week     Progress Toward Goals  OT Goals(current goals can now be  found in the care plan section)  Progress towards OT goals: Progressing toward goals-adequate for d/c  Acute Rehab OT Goals Patient Stated Goal: go home tomorrow OT Goal Formulation: With patient Time For Goal Achievement: 03/25/16 Potential to Achieve Goals: Good ADL Goals Pt Will Perform Grooming: with supervision;standing Pt Will Perform Lower Body Bathing: with supervision;sit to/from stand Pt Will Perform Lower Body Dressing: with supervision;sit to/from stand Pt Will Transfer to Toilet: with supervision;ambulating;bedside commode (over toilet) Pt Will Perform Toileting - Clothing Manipulation and hygiene: with supervision;sit to/from stand Pt Will Perform Tub/Shower Transfer: Shower transfer;with supervision;rolling walker;3 in 1;ambulating Additional ADL Goal #1: Pt will adhere to sternal precautions during ADL and mobility independently. Additional ADL Goal #2: Pt  will employ energy conservation strategies during ADL and mobility with minimal verbal cues.  Plan Discharge plan needs to be updated    Co-evaluation                 End of Session Equipment Utilized During Treatment: Gait belt;Rolling walker   Activity Tolerance Patient tolerated treatment well   Patient Left in chair;with family/visitor present   Nurse Communication          Time: 1012-1027 OT Time Calculation (min): 15 min  Charges: OT General Charges $OT Visit: 1 Procedure OT Treatments $Self Care/Home Management : 8-22 mins  Earlie RavelingStraub, Shaylin Blatt L OTR/L [phone] 03/23/2016, 11:16 AM

## 2016-03-23 NOTE — Care Management Important Message (Signed)
Important Message  Patient Details  Name: [name] [name] MRN: [id number] Date of Birth: 1941-05-16   Medicare Important Message Given:  Yes    Kyla BalzarineShealy, Avenell Sellers [name] 03/23/2016, 12:31 PM

## 2016-03-24 ENCOUNTER — Encounter (HOSPITAL_COMMUNITY): Payer: Self-pay | Admitting: Certified Registered Nurse Anesthetist

## 2016-03-24 ENCOUNTER — Encounter (HOSPITAL_COMMUNITY)
Admission: EM | Disposition: A | Discharge status: Home Health | Payer: Self-pay | Source: Home / Self Care | Attending: Cardiothoracic Surgery

## 2016-03-24 LAB — BASIC METABOLIC PANEL
Anion gap: 9 (ref 5–15)
BUN: 37 mg/dL — AB (ref 6–20)
CALCIUM: 9.2 mg/dL (ref 8.9–10.3)
CO2: 28 mmol/L (ref 22–32)
Chloride: 98 mmol/L — ABNORMAL LOW (ref 101–111)
Creatinine, Ser: 1.91 mg/dL — ABNORMAL HIGH (ref 0.61–1.24)
GFR calc Af Amer: 38 mL/min — ABNORMAL LOW (ref >60)
GFR calc non Af Amer: 33 mL/min — ABNORMAL LOW (ref >60)
GLUCOSE: 134 mg/dL — AB (ref 65–99)
Potassium: 4.3 mmol/L (ref 3.5–5.1)
Sodium: 135 mmol/L (ref 135–145)

## 2016-03-24 LAB — CBC WITH DIFFERENTIAL/PLATELET
BASOS PCT: 0 %
Basophils Absolute: 0 10*3/uL (ref 0.0–0.1)
EOS ABS: 0.4 10*3/uL (ref 0.0–0.7)
EOS PCT: 3 %
HEMATOCRIT: 32.8 % — AB (ref 39.0–52.0)
Hemoglobin: 10.4 g/dL — ABNORMAL LOW (ref 13.0–17.0)
Lymphocytes Relative: 6 %
Lymphs Abs: 0.9 10*3/uL (ref 0.7–4.0)
MCH: 30.3 pg (ref 26.0–34.0)
MCHC: 31.7 g/dL (ref 30.0–36.0)
MCV: 95.6 fL (ref 78.0–100.0)
MONO ABS: 1.4 10*3/uL — AB (ref 0.1–1.0)
MONOS PCT: 10 %
Neutro Abs: 11.4 10*3/uL — ABNORMAL HIGH (ref 1.7–7.7)
Neutrophils Relative %: 81 %
PLATELETS: 381 10*3/uL (ref 150–400)
RBC: 3.43 MIL/uL — ABNORMAL LOW (ref 4.22–5.81)
RDW: 16.2 % — AB (ref 11.5–15.5)
WBC: 14.2 10*3/uL — ABNORMAL HIGH (ref 4.0–10.5)

## 2016-03-24 SURGERY — CANCELLED PROCEDURE

## 2016-03-24 MED ORDER — ATORVASTATIN CALCIUM 80 MG PO TABS: 80.0000 mg | ORAL_TABLET | Freq: Every day | ORAL | Status: DC

## 2016-03-24 MED ORDER — POTASSIUM CHLORIDE ER 10 MEQ PO TBCR: 10.0000 meq | EXTENDED_RELEASE_TABLET | Freq: Every day | ORAL | Status: DC

## 2016-03-24 MED ORDER — PANTOPRAZOLE SODIUM 40 MG PO TBEC: 40.0000 mg | DELAYED_RELEASE_TABLET | Freq: Every day | ORAL | Status: DC

## 2016-03-24 MED ORDER — SODIUM CHLORIDE 0.9 % IV SOLN: INTRAVENOUS | Status: DC

## 2016-03-24 MED ORDER — CARVEDILOL 3.125 MG PO TABS: 3.1250 mg | ORAL_TABLET | Freq: Two times a day (BID) | ORAL | Status: DC

## 2016-03-24 MED ORDER — AMIODARONE HCL 200 MG PO TABS
200.0000 mg | ORAL_TABLET | Freq: Every day | ORAL | Status: DC
Start: 2016-03-24 — End: 2016-05-19

## 2016-03-24 MED ORDER — OXYCODONE HCL 5 MG PO TABS: 5.0000 mg | ORAL_TABLET | Freq: Four times a day (QID) | ORAL | Status: DC | PRN

## 2016-03-24 MED ORDER — CEPHALEXIN 500 MG PO CAPS: 500.0000 mg | ORAL_CAPSULE | Freq: Four times a day (QID) | ORAL | Status: DC

## 2016-03-24 MED ORDER — FUROSEMIDE 40 MG PO TABS: 40.0000 mg | ORAL_TABLET | Freq: Every day | ORAL | Status: DC

## 2016-03-24 MED ORDER — APIXABAN 5 MG PO TABS: 5.0000 mg | ORAL_TABLET | Freq: Two times a day (BID) | ORAL | Status: DC

## 2016-03-24 NOTE — Progress Notes (Signed)
  Patient frustrated this am. Demanding to go home. Refusing TEE and DC-CV that was set-up for 8a.   His AF is rate controlled. Ok to wait for outpatient DC-CV.  Cheri Ayotte,MD 8:12 AM

## 2016-03-24 NOTE — Progress Notes (Addendum)
[address]       [place] [address]             [phone]      14 Days Post-Op Procedure(s) (LRB): CORONARY ARTERY BYPASS GRAFTING (CABG)TIMES 4 USING LEFT INTERNAL MAMMARY AND BILATERAL GREATER SAPHENOUS VEIN HARVESTED BY ENDOVIEN (N/A) TRANSESOPHAGEAL ECHOCARDIOGRAM (TEE) (N/A) CLIPPING OF ATRIAL APPENDAGE (Left) LEFT ENDARTERECTOMY CAROTID DACRON PATCH ANGIOPLASTY (Left) Subjective: Insists on going home this am. Feels well  Objective: Vital signs in last 24 hours: Temp:  [98.1 F (36.7 C)-98.4 F (36.9 C)] 98.1 F (36.7 C) (04/11 0505) Pulse Rate:  [79-92] 92 (04/11 0505) Cardiac Rhythm:  [-] Atrial fibrillation (04/10 1900) Resp:  [16-18] 16 (04/11 0505) BP: (114-134)/(66-74) 134/66 mmHg (04/11 0505) SpO2:  [94 %-96 %] 94 % (04/11 0505) Weight:  [153 lb 3.2 oz (69.491 kg)] 153 lb 3.2 oz (69.491 kg) (04/11 0505)  Hemodynamic parameters for last 24 hours:    Intake/Output from previous day: 04/10 0701 - 04/11 0700 In: 3 [I.V.:3] Out: 625 [Urine:625] Intake/Output this shift:    General appearance: alert, cooperative and no distress Heart: irregularly irregular rhythm Lungs: mildly dim in left base Abdomen: benign Extremities: min edema Wound: incis healing well  Lab Results:  Recent Labs  03/23/16 0250 03/24/16 0241  WBC 17.1* 14.2*  HGB 10.3* 10.4*  HCT 31.8* 32.8*  PLT 372 381   BMET:  Recent Labs  03/23/16 0250 03/24/16 0241  NA 134* 135  K 4.6 4.3  CL 96* 98*  CO2 29 28  GLUCOSE 96 134*  BUN 39* 37*  CREATININE 2.01* 1.91*  CALCIUM 9.2 9.2    PT/INR: No results for input(s): LABPROT, INR in the last 72 hours. ABG    Component Value Date/Time   PHART 7.356 03/12/2016 0400   HCO3 24.7* 03/12/2016 1622   TCO2 20 03/13/2016 1602   ACIDBASEDEF 2.0 03/12/2016 1622   O2SAT 63.4 03/13/2016 0449   CBG (last 3)  No results for input(s): GLUCAP in the last 72 hours.  Meds Scheduled Meds: . amiodarone  200 mg  Oral Daily  . antiseptic oral rinse  7 mL Mouth Rinse BID  . apixaban  5 mg Oral BID  . aspirin EC  81 mg Oral Daily  . atorvastatin  80 mg Oral q1800  . carvedilol  3.125 mg Oral BID WC  . cephALEXin  500 mg Oral 4 times per day  . feeding supplement (ENSURE ENLIVE)  237 mL Oral BID BM  . furosemide  40 mg Oral Daily  . pantoprazole  40 mg Oral Daily  . potassium chloride  40 mEq Oral Daily  . sodium chloride flush  3 mL Intravenous Q12H  . sodium chloride flush  3 mL Intravenous Q12H   Continuous Infusions: . sodium chloride 10 mL/hr at 03/11/16 2101  . sodium chloride 10 mL/hr at 03/11/16 2101  . sodium chloride    . sodium chloride     PRN Meds:.sodium chloride, ondansetron (ZOFRAN) IV, oxyCODONE, sodium chloride flush, sodium chloride flush, traMADol  Xrays No results found.  Assessment/Plan: S/P Procedure(s) (LRB): CORONARY ARTERY BYPASS GRAFTING (CABG)TIMES 4 USING LEFT INTERNAL MAMMARY AND BILATERAL GREATER SAPHENOUS VEIN HARVESTED BY ENDOVIEN (N/A) TRANSESOPHAGEAL ECHOCARDIOGRAM (TEE) (N/A) CLIPPING OF ATRIAL APPENDAGE (Left) LEFT ENDARTERECTOMY CAROTID DACRON PATCH ANGIOPLASTY (Left) Plan for discharge: see discharge orders   LOS: 22 days    GOLD,WAYNE E 03/24/2016  Agree with plan for DC on Eliquis and lateer  [facility] patient examined and medical record reviewed,agree with above note. Kathlee Nationseter Van Trigt III 03/24/2016

## 2016-03-24 NOTE — Progress Notes (Signed)
Patient told NT that I did not give him his heart medication. Advised that the smaller pill is given during the day only (Coreg). The only pill scheduled at night was the Amiodarone. He also stated that he didn't think I gave him his antibiotic. Told him that it was the oral liquid medication that I gave him last night and again this morning. Wants his medical records given to him when he leaves. Told him that I was already aware and that the morning nurse will let medical records know to have them ready when she receives discharge orders. Also verified with him again that he does not want the cardioversion. Advised him I would notate this and also advise the morning RN as well.

## 2016-03-24 NOTE — Progress Notes (Signed)
CARDIAC REHAB PHASE I   PRE:  Rate/Rhythm: 88 a. fib  BP:  Sitting: 106/96        SaO2: 96 RA  MODE:  Ambulation: 440 ft   POST:  Rate/Rhythm: 116 a. fib  BP:  Sitting: 176/94         SaO2: 98 RA  Pt ambulated 440 ft on RA, rolling walker, steady gait, tolerated well, no complaints. Pt BP elevated, states he has not received his BP medication this morning. Pt states he has no questions regarding discharge education at this time. Pt eager to go home. Pt to recliner after walk, feet elevated, call bell within reach.   [phone] Joylene GrapesEmily C Dorothy Landgrebe, RN, BSN 03/24/2016 8:31 AM

## 2016-03-24 NOTE — Progress Notes (Signed)
Spoke with patient again at 2300. Is still refusing to do the cardioversion. Is adamant that he is going home.  Would not agree to do any of the pre-procedures as well. Will keep NPO in case he changes his mind.

## 2016-03-24 NOTE — Care Management Note (Signed)
Case Management Note Donn PieriniKristi Corvette Orser RN, BSN Unit 2W-Case Manager [phone]  Patient Details  Name: [name] [name] MRN: [id number] Date of Birth: Dec 31, 1940  Subjective/Objective:     Pt s/p CABG x4 hx HF               Action/Plan: PTA pt lived at home with wife- anticipate return home- referral received for insurance check on Eliquis- benefits check completed and pt has $47 per 30 days copay with no pre-auth needed- orders have been placed for HH-RN/PT/OT and DME- RW-- in to speak with pt at bedside- per conversation pt states that he does not feel like he needs HH therapy and states that he does not want a HH nurse to check on him- he would prefer to go the doctor's office if needed- explained the benefits of what a HH RN could do - pt still politely declines HH services at this time- informed pt that if he changes his mind to let staff know and services could still be arranged prior to discharge. - Explained benefit coverage for Eliquis- and pt given 30 day free card- [facility]-  [facility] to room prior to discharge.   Expected Discharge Date:    03/24/16              Expected Discharge Plan:  Home w Home Health Services  In-House Referral:     Discharge planning Services  CM Consult, Medication Assistance  Post Acute Care Choice:  Durable Medical Equipment, Home Health Choice offered to:  Patient, Spouse  DME Arranged:  Walker rolling DME Agency:  [facility]  HH Arranged:  RN, PT, OT, Patient Refused HH Agency:     Status of Service:  Completed, signed off  Medicare Important Message Given:  Yes Date Medicare IM Given:    Medicare IM give by:    Date Additional Medicare IM Given:    Additional Medicare Important Message give by:     If discussed at Long Length of Stay Meetings, dates discussed:  03/24/16  Discharge Disposition: home/self care (pt refused HH services)   Additional Comments:  03/23/16- 1100- Donn PieriniKristi Jasraj Lappe RN, BSN- f/u  done with pt and his wife at bedside regarding d/c needs and HH- per conversation pt still politely declines any HH services- and wife states that she was in healthcare in her home country and feels like she can look after things with her husband and call doctor if needed. Again explained the benefits of home health and what HH would do. Both pt and wife strongly feel like they do not need any HH services or follow up in the home and [facility].   Darrold SpanWebster, Sayde Lish Hall, RN 03/24/2016, 9:58 AM

## 2016-03-25 ENCOUNTER — Encounter: Payer: Self-pay | Admitting: Vascular Surgery

## 2016-04-02 ENCOUNTER — Ambulatory Visit (INDEPENDENT_AMBULATORY_CARE_PROVIDER_SITE_OTHER): Payer: Commercial Managed Care - HMO | Admitting: Vascular Surgery

## 2016-04-02 ENCOUNTER — Inpatient Hospital Stay (HOSPITAL_COMMUNITY): Admit: 2016-04-02 | Payer: Commercial Managed Care - HMO

## 2016-04-02 ENCOUNTER — Encounter: Payer: Self-pay | Admitting: Vascular Surgery

## 2016-04-02 VITALS — BP 144/72 | HR 88 | Temp 97.4°F | Resp 16 | Ht 62.0 in | Wt 151.0 lb

## 2016-04-02 DIAGNOSIS — I6522 Occlusion and stenosis of left carotid artery: Secondary | ICD-10-CM

## 2016-04-02 NOTE — Progress Notes (Signed)
Patient is a 75 year old male returns for postoperative follow-up today. He underwent left carotid endarterectomy combined with coronary artery bypass grafting March 28. He states he has no difficulty swallowing. He has had no episodes of upper and lower extremity motor weakness. He denies any visual changes. He is still overall fairly deconditioned.  Physical exam:  Filed Vitals:   04/02/16 1020 04/02/16 1024  BP: 160/90 144/72  Pulse: 88 88  Temp: 97.4 F (36.3 C)   TempSrc: Oral   Resp: 16   Height: 5\' 2"  (1.575 m)   Weight: 151 lb (68.493 kg)   SpO2: 98%     Neck: Soft left carotid bruit no right bruit left neck incision well-healed  Neuro: Symmetric upper extremity and lower extremity motor strength which is 5 over 5 tongue is midline no facial asymmetry  Assessment: Doing well status post left carotid endarterectomy  Plan: Follow-up 6 months repeat carotid duplex exam  Fabienne Brunsharles Fields, MD [facility] Office: [phone] Pager: [phone]

## 2016-04-07 ENCOUNTER — Encounter: Payer: Self-pay | Admitting: *Deleted

## 2016-04-07 ENCOUNTER — Ambulatory Visit (INDEPENDENT_AMBULATORY_CARE_PROVIDER_SITE_OTHER): Payer: Commercial Managed Care - HMO | Admitting: Physician Assistant

## 2016-04-07 ENCOUNTER — Encounter: Payer: Self-pay | Admitting: Physician Assistant

## 2016-04-07 VITALS — BP 130/80 | HR 88 | Ht 62.0 in | Wt 151.0 lb

## 2016-04-07 DIAGNOSIS — I5031 Acute diastolic (congestive) heart failure: Secondary | ICD-10-CM

## 2016-04-07 DIAGNOSIS — I110 Hypertensive heart disease with heart failure: Secondary | ICD-10-CM

## 2016-04-07 DIAGNOSIS — Z951 Presence of aortocoronary bypass graft: Secondary | ICD-10-CM

## 2016-04-07 DIAGNOSIS — I4819 Other persistent atrial fibrillation: Secondary | ICD-10-CM

## 2016-04-07 DIAGNOSIS — I2510 Atherosclerotic heart disease of native coronary artery without angina pectoris: Secondary | ICD-10-CM | POA: Diagnosis not present

## 2016-04-07 DIAGNOSIS — I10 Essential (primary) hypertension: Secondary | ICD-10-CM

## 2016-04-07 DIAGNOSIS — I2583 Coronary atherosclerosis due to lipid rich plaque: Secondary | ICD-10-CM

## 2016-04-07 DIAGNOSIS — I739 Peripheral vascular disease, unspecified: Secondary | ICD-10-CM

## 2016-04-07 DIAGNOSIS — I481 Persistent atrial fibrillation: Secondary | ICD-10-CM

## 2016-04-07 DIAGNOSIS — I779 Disorder of arteries and arterioles, unspecified: Secondary | ICD-10-CM

## 2016-04-07 NOTE — Addendum Note (Signed)
Addended by: Oleta MouseVERTON, Bralynn Donado M on: 04/07/2016 03:11 PM   Modules accepted: Orders

## 2016-04-07 NOTE — Progress Notes (Signed)
Patient ID: [name] [name], male   DOB: [birth year], 75 y.o.   MRN: [id number]    Date:  04/07/2016   ID:  [name] Deisibor Stieber, DOB [birth year], MRN [id number]  PCP:  Joycelyn RuaMEYERS, STEPHEN, MD  Primary Cardiologist:  Tresa EndoKelly   Chief Complaint  Patient presents with  . Follow-up    some swelling, numbness in rt hand radiating from elbow,      History of Present Illness: [name] Deisibor Taul is a 75 y.o. male who has not sought medical care in many years who presented to ED on 03/02/16 with SOB, hypertensive with BP of 220/130, and lower extremity edema. His echo showed EF of 35% with diffuse hypokinesis with akinesis of the basal and midinferolateral walls. Left heart cath on 03/04/16 revealed multivessel disease, he had CABG x4 and left carotid endarterectomy on 03/10/16. He was in atrial fibrillation prior to discharge with plan to go undergo TEE/DCCV but the patient refused and wanted to go home. His rate was controlled on amiodarone 200 mg twice a day. He is also on Eliquis 5 mg twice a day.    He is here today for posthospital follow-up and reports doing well. He's had some issues with nausea and takes an over-the-counter medication recommended by the pharmacist which helps. He's had some mild lower extremity edema but denies orthopnea, PND.  His chest feels good.   The patient currently denies vomiting, fever, chest pain, shortness of breath, dizziness, cough, congestion, abdominal pain, hematochezia, melena, lower extremity edema, claudication.  Wt Readings from Last 3 Encounters:  04/07/16 151 lb (68.493 kg)  04/02/16 151 lb (68.493 kg)  03/24/16 153 lb 3.2 oz (69.491 kg)     Past Medical History  Diagnosis Date  . Atrial fibrillation (HCC) 03/21/2016    Current Outpatient Prescriptions  Medication Sig Dispense Refill  . amiodarone (PACERONE) 200 MG tablet Take 1 tablet (200 mg total) by mouth daily. 30 tablet 1  . apixaban (ELIQUIS) 5 MG TABS tablet Take 1 tablet (5 mg total) by mouth 2 (two) times daily.  60 tablet 1  . Ascorbic Acid (VITAMIN C) 1000 MG tablet Take 1,000 mg by mouth daily.    Marland Kitchen. aspirin 81 MG tablet Take 81 mg by mouth every Monday, Wednesday, and Friday.    Marland Kitchen. atorvastatin (LIPITOR) 80 MG tablet Take 1 tablet (80 mg total) by mouth daily at 6 PM. 30 tablet 1  . carvedilol (COREG) 3.125 MG tablet Take 1 tablet (3.125 mg total) by mouth 2 (two) times daily with a meal. 60 tablet 1  . furosemide (LASIX) 40 MG tablet Take 1 tablet (40 mg total) by mouth daily. 30 tablet 1  . magnesium 30 MG tablet Take 30 mg by mouth daily.    . pantoprazole (PROTONIX) 40 MG tablet Take 1 tablet (40 mg total) by mouth daily. 30 tablet 1  . potassium chloride (K-DUR) 10 MEQ tablet Take 1 tablet (10 mEq total) by mouth daily. 30 tablet 1   No current facility-administered medications for this visit.    Allergies:   No Known Allergies  Social History:  The patient  reports that he has quit smoking. He does not have any smokeless tobacco history on file.   Family history:  No family history on file.  ROS:  Please see the history of present illness.  All other systems reviewed and negative.   PHYSICAL EXAM: VS:  BP 130/80 mmHg  Pulse 88  Ht 5\' 2"  (1.575 m)  Wt 151 lb (68.493 kg)  BMI 27.61 kg/m2 Well nourished, well developed, in no acute distress HEENT: Pupils are equal round react to light accommodation extraocular movements are intact.  Neck: no JVDNo cervical lymphadenopathy. Cardiac: Irregular rate and rhythm with a 1 out of 6 high-pitched murmur in the axilla. Lungs:  clear to auscultation bilaterally, no wheezing, rhonchi or rales Abd: soft, nontender, positive bowel sounds all quadrants, no hepatosplenomegaly Ext: no lower extremity edema.  2+ radial and dorsalis pedis pulses. Skin: warm and dry. His sternal wound is healing well no signs of infection. No erythema or discharge or swelling.  His left endarterectomy scar is also healing well. Neuro:  Grossly normal  EKG:  Atrial  fibrillation with a rate of 95 bpm.  ASSESSMENT AND PLAN:  Problem List Items Addressed This Visit    S/P CABG x 4   Hypertensive heart disease with heart failure (HCC)   Essential hypertension   CAD (coronary artery disease)   Bilateral carotid artery disease (HCC)   Atrial fibrillation (HCC)   RESOLVED: Acute systolic and diastolic CHF (congestive heart failure), NYHA class 1 (HCC) - Primary     Mr. Wiechmann presents for post CABG follow-up. He is doing quite well and his surgical wounds are healing well.  He has had some mild nausea for which she's taken over-the-counter medication which helps. He is still in atrial fibrillation at a rate of 95 bpm. It was originally planned that he would undergo TEE cardioversion prior to discharge however, he refused and wanted to go home after being in the hospital for 3 weeks. He's been taking eliquis for at least 3 weeks and is now on amiodarone 200 mg daily.  We set him up for DC CV on May 4 with Dr. Anne Fu. Tuesday he is scheduled have a chest x-ray and see Dr. Donata Clay.  Other medications include aspirin, Lipitor, Coreg 3.125 twice daily, Lasix 40 mg daily and potassium 10 MG daily. He has some mild lower extremity edema but otherwise appears euvolemic blood pressure is controlled.  Follow-up in 3 months with Dr. Tresa Endo.

## 2016-04-07 NOTE — Patient Instructions (Addendum)
Medication Instructions:   Your physician recommends that you continue on your current medications as directed. Please refer to the Current Medication list given to you today.  If you need a refill on your cardiac medications before your next appointment, please call your pharmacy.  Labwork:  NONE ORDER TODAY  Testing/Procedures:  SEE LETTER FOR CARDIOVERSION INSTRUCTIONS  A chest x-ray takes a picture of the organs and structures inside the chest, including the heart, lungs, and blood vessels. This test can show several things, including, whether the heart is enlarges; whether fluid is building up in the lungs; and whether pacemaker / defibrillator leads are still in place. ALREADY SCHEDULED..    Follow-Up: IN 3 MONTHS WITH DR Tresa EndoKELLY   Any Other Special Instructions Will Be Listed Below (If Applicable).

## 2016-04-10 ENCOUNTER — Telehealth: Payer: Self-pay | Admitting: *Deleted

## 2016-04-10 NOTE — Telephone Encounter (Signed)
lmovm reminder pre cardioversion of lab work

## 2016-04-13 ENCOUNTER — Telehealth: Payer: Self-pay | Admitting: *Deleted

## 2016-04-13 ENCOUNTER — Other Ambulatory Visit: Payer: Self-pay | Admitting: Physician Assistant

## 2016-04-13 ENCOUNTER — Other Ambulatory Visit (HOSPITAL_COMMUNITY)
Admission: AD | Admit: 2016-04-13 | Discharge: 2016-04-13 | Disposition: A | Discharge status: Home / Self Care | Payer: Commercial Managed Care - HMO | Source: Ambulatory Visit | Attending: Cardiology | Admitting: Cardiology

## 2016-04-13 DIAGNOSIS — I481 Persistent atrial fibrillation: Secondary | ICD-10-CM | POA: Diagnosis not present

## 2016-04-13 LAB — CBC
HCT: 38.1 % — ABNORMAL LOW (ref 38.5–50.0)
Hemoglobin: 12.7 g/dL — ABNORMAL LOW (ref 13.2–17.1)
MCH: 31.1 pg (ref 27.0–33.0)
MCHC: 33.3 g/dL (ref 32.0–36.0)
MCV: 93.4 fL (ref 80.0–100.0)
MPV: 10.6 fL (ref 7.5–12.5)
PLATELETS: 247 10*3/uL (ref 140–400)
RBC: 4.08 MIL/uL — AB (ref 4.20–5.80)
RDW: 16.5 % — ABNORMAL HIGH (ref 11.0–15.0)
WBC: 9.9 10*3/uL (ref 3.8–10.8)

## 2016-04-13 LAB — APTT: APTT: 37 s (ref 24–37)

## 2016-04-13 NOTE — Telephone Encounter (Signed)
SPOKE TO PT AND REVERFIED INSTRUCTIONS ON VM TO RIGHT LOCATION FOR LABS WHICH IS THE FIRST FLOOR AT THE [address] AND GAVE LAB HOURS WHICH ARE 8 AM TO 4 :30 PM LUNCH BREAK 12:30 TO 1PM ..PT VERBALIZED UNDERSTANDING

## 2016-04-13 NOTE — Telephone Encounter (Signed)
CALLED PT.

## 2016-04-14 ENCOUNTER — Other Ambulatory Visit: Payer: Self-pay | Admitting: Cardiothoracic Surgery

## 2016-04-14 DIAGNOSIS — Z951 Presence of aortocoronary bypass graft: Secondary | ICD-10-CM

## 2016-04-14 LAB — BASIC METABOLIC PANEL
BUN: 19 mg/dL (ref 7–25)
CHLORIDE: 104 mmol/L (ref 98–110)
CO2: 26 mmol/L (ref 20–31)
Calcium: 9.6 mg/dL (ref 8.6–10.3)
Creat: 1.2 mg/dL — ABNORMAL HIGH (ref 0.70–1.18)
Glucose, Bld: 93 mg/dL (ref 65–99)
POTASSIUM: 3.7 mmol/L (ref 3.5–5.3)
Sodium: 142 mmol/L (ref 135–146)

## 2016-04-15 ENCOUNTER — Ambulatory Visit (INDEPENDENT_AMBULATORY_CARE_PROVIDER_SITE_OTHER): Payer: Self-pay | Admitting: Cardiothoracic Surgery

## 2016-04-15 ENCOUNTER — Ambulatory Visit
Admission: RE | Admit: 2016-04-15 | Discharge: 2016-04-15 | Disposition: A | Discharge status: Home / Self Care | Payer: 59 | Source: Ambulatory Visit | Attending: Cardiothoracic Surgery | Admitting: Cardiothoracic Surgery

## 2016-04-15 ENCOUNTER — Encounter: Payer: Self-pay | Admitting: Cardiothoracic Surgery

## 2016-04-15 ENCOUNTER — Other Ambulatory Visit: Payer: Self-pay | Admitting: *Deleted

## 2016-04-15 VITALS — BP 119/88 | HR 74 | Resp 20 | Ht 62.0 in | Wt 151.0 lb

## 2016-04-15 DIAGNOSIS — J90 Pleural effusion, not elsewhere classified: Secondary | ICD-10-CM

## 2016-04-15 DIAGNOSIS — Z951 Presence of aortocoronary bypass graft: Secondary | ICD-10-CM

## 2016-04-15 DIAGNOSIS — R0602 Shortness of breath: Secondary | ICD-10-CM | POA: Diagnosis not present

## 2016-04-15 DIAGNOSIS — J948 Other specified pleural conditions: Secondary | ICD-10-CM

## 2016-04-15 MED ORDER — METOLAZONE 5 MG PO TABS: 5.0000 mg | ORAL_TABLET | Freq: Every day | ORAL | Status: DC

## 2016-04-15 NOTE — Progress Notes (Signed)
PCP is Joycelyn RuaMEYERS, STEPHEN, MD Referring Provider is Lennette BihariKelly, Thomas A, MD  Chief Complaint  Patient presents with  . Routine Post Op    f/u from surgery with CXR, s/p Coronary artery bypass grafting x4, 03/10/16     HPI: Postop office visit one month after multivessel CABG Patient presented with ischemic cardiomyopathy from completed acute MI Postoperatively he developed atrial fibrillation and edema He is on Eliquis and amiodarone and set up tohave cardioversion as outpatient to the hospital tomorrow Chest x-ray today shows a left moderate pleural effusion new since discharge  Past Medical History  Diagnosis Date  . Atrial fibrillation (HCC) 03/21/2016    Past Surgical History  Procedure Laterality Date  . Cardiac catheterization N/A 03/04/2016    Procedure: Right/Left Heart Cath and Coronary Angiography;  Surgeon: Lennette Biharihomas A Kelly, MD;  [facility];  Service: Cardiovascular;  Laterality: N/A;  . Coronary artery bypass graft N/A 03/10/2016    Procedure: CORONARY ARTERY BYPASS GRAFTING (CABG)TIMES 4 USING LEFT INTERNAL MAMMARY AND BILATERAL GREATER SAPHENOUS VEIN HARVESTED BY ENDOVIEN;  Surgeon: Kerin PernaPeter Van Trigt, MD;  Location: [facility] OR;  Service: Open Heart Surgery;  Laterality: N/A;  . Tee without cardioversion N/A 03/10/2016    Procedure: TRANSESOPHAGEAL ECHOCARDIOGRAM (TEE);  Surgeon: Kerin PernaPeter Van Trigt, MD;  [facility];  Service: Open Heart Surgery;  Laterality: N/A;  . Clipping of atrial appendage Left 03/10/2016    Procedure: CLIPPING OF ATRIAL APPENDAGE;  Surgeon: Kerin PernaPeter Van Trigt, MD;  [facility];  Service: Open Heart Surgery;  Laterality: Left;  . Endarterectomy Left 03/10/2016    Procedure: LEFT ENDARTERECTOMY CAROTID DACRON PATCH ANGIOPLASTY;  Surgeon: Sherren Kernsharles E Fields, MD;  [facility];  Service: Vascular;  Laterality: Left;    No family history on file.  Social History Social History  Substance Use Topics  . Smoking status: Former Games developermoker  . Smokeless  tobacco: None  . Alcohol Use: None    Current Outpatient Prescriptions  Medication Sig Dispense Refill  . amiodarone (PACERONE) 200 MG tablet Take 1 tablet (200 mg total) by mouth daily. 30 tablet 1  . apixaban (ELIQUIS) 5 MG TABS tablet Take 1 tablet (5 mg total) by mouth 2 (two) times daily. 60 tablet 1  . Ascorbic Acid (VITAMIN C) 1000 MG tablet Take 1,000 mg by mouth daily.    Marland Kitchen. aspirin 81 MG tablet Take 81 mg by mouth every Monday, Wednesday, and Friday.    Marland Kitchen. atorvastatin (LIPITOR) 80 MG tablet Take 1 tablet (80 mg total) by mouth daily at 6 PM. 30 tablet 1  . carvedilol (COREG) 3.125 MG tablet Take 1 tablet (3.125 mg total) by mouth 2 (two) times daily with a meal. 60 tablet 1  . furosemide (LASIX) 40 MG tablet Take 1 tablet (40 mg total) by mouth daily. 30 tablet 1  . magnesium 30 MG tablet Take 30 mg by mouth daily.    . pantoprazole (PROTONIX) 40 MG tablet Take 1 tablet (40 mg total) by mouth daily. 30 tablet 1  . potassium chloride (K-DUR) 10 MEQ tablet Take 1 tablet (10 mEq total) by mouth daily. 30 tablet 1  . metolazone (ZAROXOLYN) 5 MG tablet Take 1 tablet (5 mg total) by mouth daily. 5 tablet 1   No current facility-administered medications for this visit.    No Known Allergies  Review of Systems  Nausea better Some shortness of breath and a dry cough  BP 119/88 mmHg  Pulse 74  Resp 20  Ht 5' 2" (1.575 m)  Wt 151 lb (68.493 kg)  BMI 27.61 kg/m2  SpO2 95% Physical Exam Alert and comfortable Sternal incision well-healed Breath sounds diminished at left base Heart rhythm irregular-A. Fib 2+ bilateral edema right greater than left  Diagnostic Tests: Chest x-ray personally reviewed showing left pleural effusion, moderate  Impression: Atrial fibrillation-to be cardioverted at hospital tomorrow Left pleural effusion-we'll do thoracentesis after patient   stops eliquis  43 doses Plan: Return in a week for left thoracentesis in office  Peter Van Trigt III,  MD [facility] [phone]  

## 2016-04-16 ENCOUNTER — Encounter (HOSPITAL_COMMUNITY)
Admission: RE | Disposition: A | Discharge status: Home / Self Care | Payer: Self-pay | Source: Ambulatory Visit | Attending: Cardiology

## 2016-04-16 ENCOUNTER — Ambulatory Visit (HOSPITAL_COMMUNITY): Payer: Commercial Managed Care - HMO | Admitting: Anesthesiology

## 2016-04-16 ENCOUNTER — Encounter (HOSPITAL_COMMUNITY): Payer: Self-pay

## 2016-04-16 ENCOUNTER — Ambulatory Visit (HOSPITAL_COMMUNITY)
Admission: RE | Admit: 2016-04-16 | Discharge: 2016-04-16 | Disposition: A | Discharge status: Home / Self Care | Payer: Commercial Managed Care - HMO | Source: Ambulatory Visit | Attending: Cardiology | Admitting: Cardiology

## 2016-04-16 DIAGNOSIS — J90 Pleural effusion, not elsewhere classified: Secondary | ICD-10-CM | POA: Diagnosis not present

## 2016-04-16 DIAGNOSIS — I252 Old myocardial infarction: Secondary | ICD-10-CM | POA: Insufficient documentation

## 2016-04-16 DIAGNOSIS — Z7982 Long term (current) use of aspirin: Secondary | ICD-10-CM | POA: Insufficient documentation

## 2016-04-16 DIAGNOSIS — I255 Ischemic cardiomyopathy: Secondary | ICD-10-CM | POA: Diagnosis not present

## 2016-04-16 DIAGNOSIS — I4891 Unspecified atrial fibrillation: Secondary | ICD-10-CM | POA: Insufficient documentation

## 2016-04-16 DIAGNOSIS — Z87891 Personal history of nicotine dependence: Secondary | ICD-10-CM | POA: Insufficient documentation

## 2016-04-16 DIAGNOSIS — Z955 Presence of coronary angioplasty implant and graft: Secondary | ICD-10-CM | POA: Diagnosis not present

## 2016-04-16 DIAGNOSIS — Z7901 Long term (current) use of anticoagulants: Secondary | ICD-10-CM | POA: Diagnosis not present

## 2016-04-16 DIAGNOSIS — I480 Paroxysmal atrial fibrillation: Secondary | ICD-10-CM | POA: Diagnosis not present

## 2016-04-16 HISTORY — PX: CARDIOVERSION: SHX1299

## 2016-04-16 SURGERY — CARDIOVERSION
Anesthesia: Monitor Anesthesia Care

## 2016-04-16 MED ORDER — PROPOFOL 10 MG/ML IV BOLUS
INTRAVENOUS | Status: DC | PRN
  Administered 2016-04-16: 20 mg via INTRAVENOUS
  Administered 2016-04-16: 30 mg via INTRAVENOUS

## 2016-04-16 MED ORDER — LIDOCAINE HCL (CARDIAC) 20 MG/ML IV SOLN
INTRAVENOUS | Status: DC | PRN
  Administered 2016-04-16: 60 mg via INTRAVENOUS

## 2016-04-16 MED ORDER — SODIUM CHLORIDE 0.9 % IV SOLN
INTRAVENOUS | Status: DC
  Administered 2016-04-16: 09:00:00 via INTRAVENOUS

## 2016-04-16 NOTE — Anesthesia Postprocedure Evaluation (Signed)
Anesthesia Post Note  Patient: [name] [name]  Procedure(s) Performed: Procedure(s) (LRB): CARDIOVERSION (N/A)  Patient location during evaluation: Endoscopy Anesthesia Type: MAC Level of consciousness: awake and alert and oriented Pain management: pain level controlled Vital Signs Assessment: post-procedure vital signs reviewed and stable Respiratory status: spontaneous breathing Cardiovascular status: blood pressure returned to baseline and stable Anesthetic complications: no    Last Vitals:  Filed Vitals:   04/16/16 0818  BP: 145/100  Pulse: 81  Temp: 36.5 C  Resp: 20    Last Pain: There were no vitals filed for this visit.               Lucinda DellECARLO, Isis Costanza M

## 2016-04-16 NOTE — Anesthesia Preprocedure Evaluation (Signed)
Anesthesia Evaluation  Patient identified by MRN, date of birth, ID band Patient awake    Reviewed: Allergy & Precautions, NPO status , Patient's Chart, lab work & pertinent test results, reviewed documented beta blocker date and time   Airway Mallampati: I  TM Distance: >3 FB Neck ROM: Full    Dental  (+) Dental Advisory Given, Edentulous Upper, Caps   Pulmonary former smoker,    Pulmonary exam normal breath sounds clear to auscultation       Cardiovascular hypertension, Pt. on medications and Pt. on home beta blockers + CAD, + CABG, + Peripheral Vascular Disease and +CHF  + dysrhythmias Atrial Fibrillation  Rhythm:Irregular Rate:Abnormal     Neuro/Psych negative neurological ROS  negative psych ROS   GI/Hepatic negative GI ROS, Neg liver ROS,   Endo/Other  negative endocrine ROS  Renal/GU negative Renal ROS     Musculoskeletal negative musculoskeletal ROS (+)   Abdominal   Peds  Hematology  (+) Blood dyscrasia, anemia ,   Anesthesia Other Findings Day of surgery medications reviewed with the patient.  Reproductive/Obstetrics                             Anesthesia Physical Anesthesia Plan  ASA: III  Anesthesia Plan: MAC   Post-op Pain Management:    Induction: Intravenous  Airway Management Planned: Nasal Cannula  Additional Equipment:   Intra-op Plan:   Post-operative Plan:   Informed Consent: I have reviewed the patients History and Physical, chart, labs and discussed the procedure including the risks, benefits and alternatives for the proposed anesthesia with the patient or authorized representative who has indicated his/her understanding and acceptance.   Dental advisory given  Plan Discussed with: CRNA and Anesthesiologist  Anesthesia Plan Comments: (Discussed risks/benefits/alternatives to MAC sedation including need for ventilatory support, hypotension, need for  conversion to general anesthesia.  All patient questions answered.  Patient/guardian wishes to proceed.)        Anesthesia Quick Evaluation

## 2016-04-16 NOTE — CV Procedure (Signed)
    Electrical Cardioversion Procedure Note [name] [name] [id number] 1941-10-20  Procedure: Electrical Cardioversion Indications:  Atrial Fibrillation  Time Out: Verified patient identification, verified procedure,medications/allergies/relevent history reviewed, required imaging and test results available.  Performed  Procedure Details  The patient was NPO after midnight. Anesthesia was administered at the beside  by Dr.Turk propofol.  Cardioversion was performed with synchronized biphasic defibrillation via AP pads with 120 joules.  1 attempt(s) were performed.  The patient converted to normal sinus rhythm. The patient tolerated the procedure well   IMPRESSION:  Successful cardioversion of atrial fibrillation to NSR, on AMIO and Eliquis    [name] [name] 04/16/2016, 9:38 AM

## 2016-04-16 NOTE — H&P (View-Only) (Signed)
PCP is Joycelyn RuaMEYERS, STEPHEN, MD Referring Provider is Lennette BihariKelly, Thomas A, MD  Chief Complaint  Patient presents with  . Routine Post Op    f/u from surgery with CXR, s/p Coronary artery bypass grafting x4, 03/10/16     HPI: Postop office visit one month after multivessel CABG Patient presented with ischemic cardiomyopathy from completed acute MI Postoperatively he developed atrial fibrillation and edema He is on Eliquis and amiodarone and set up tohave cardioversion as outpatient to the hospital tomorrow Chest x-ray today shows a left moderate pleural effusion new since discharge  Past Medical History  Diagnosis Date  . Atrial fibrillation (HCC) 03/21/2016    Past Surgical History  Procedure Laterality Date  . Cardiac catheterization N/A 03/04/2016    Procedure: Right/Left Heart Cath and Coronary Angiography;  Surgeon: Lennette Biharihomas A Kelly, MD;  [facility];  Service: Cardiovascular;  Laterality: N/A;  . Coronary artery bypass graft N/A 03/10/2016    Procedure: CORONARY ARTERY BYPASS GRAFTING (CABG)TIMES 4 USING LEFT INTERNAL MAMMARY AND BILATERAL GREATER SAPHENOUS VEIN HARVESTED BY ENDOVIEN;  Surgeon: Kerin PernaPeter Van Trigt, MD;  Location: [facility] OR;  Service: Open Heart Surgery;  Laterality: N/A;  . Tee without cardioversion N/A 03/10/2016    Procedure: TRANSESOPHAGEAL ECHOCARDIOGRAM (TEE);  Surgeon: Kerin PernaPeter Van Trigt, MD;  [facility];  Service: Open Heart Surgery;  Laterality: N/A;  . Clipping of atrial appendage Left 03/10/2016    Procedure: CLIPPING OF ATRIAL APPENDAGE;  Surgeon: Kerin PernaPeter Van Trigt, MD;  [facility];  Service: Open Heart Surgery;  Laterality: Left;  . Endarterectomy Left 03/10/2016    Procedure: LEFT ENDARTERECTOMY CAROTID DACRON PATCH ANGIOPLASTY;  Surgeon: Sherren Kernsharles E Fields, MD;  [facility];  Service: Vascular;  Laterality: Left;    No family history on file.  Social History Social History  Substance Use Topics  . Smoking status: Former Games developermoker  . Smokeless  tobacco: None  . Alcohol Use: None    Current Outpatient Prescriptions  Medication Sig Dispense Refill  . amiodarone (PACERONE) 200 MG tablet Take 1 tablet (200 mg total) by mouth daily. 30 tablet 1  . apixaban (ELIQUIS) 5 MG TABS tablet Take 1 tablet (5 mg total) by mouth 2 (two) times daily. 60 tablet 1  . Ascorbic Acid (VITAMIN C) 1000 MG tablet Take 1,000 mg by mouth daily.    Marland Kitchen. aspirin 81 MG tablet Take 81 mg by mouth every Monday, Wednesday, and Friday.    Marland Kitchen. atorvastatin (LIPITOR) 80 MG tablet Take 1 tablet (80 mg total) by mouth daily at 6 PM. 30 tablet 1  . carvedilol (COREG) 3.125 MG tablet Take 1 tablet (3.125 mg total) by mouth 2 (two) times daily with a meal. 60 tablet 1  . furosemide (LASIX) 40 MG tablet Take 1 tablet (40 mg total) by mouth daily. 30 tablet 1  . magnesium 30 MG tablet Take 30 mg by mouth daily.    . pantoprazole (PROTONIX) 40 MG tablet Take 1 tablet (40 mg total) by mouth daily. 30 tablet 1  . potassium chloride (K-DUR) 10 MEQ tablet Take 1 tablet (10 mEq total) by mouth daily. 30 tablet 1  . metolazone (ZAROXOLYN) 5 MG tablet Take 1 tablet (5 mg total) by mouth daily. 5 tablet 1   No current facility-administered medications for this visit.    No Known Allergies  Review of Systems  Nausea better Some shortness of breath and a dry cough  BP 119/88 mmHg  Pulse 74  Resp 20  Ht  (1.575 m)  Wt 151 lb (68.493 kg)  BMI 27.61 kg/m2  SpO2 95% Physical Exam Alert and comfortable Sternal incision well-healed Breath sounds diminished at left base Heart rhythm irregular-A. Fib 2+ bilateral edema right greater than left  Diagnostic Tests: Chest x-ray personally reviewed showing left pleural effusion, moderate  Impression: Atrial fibrillation-to be cardioverted at hospital tomorrow Left pleural effusion-we'll do thoracentesis after patient   stops eliquis  43 doses Plan: Return in a week for left thoracentesis in office  Mikey Bussing,  MD [facility] [phone]

## 2016-04-16 NOTE — Discharge Instructions (Signed)
Electrical Cardioversion, Care After °Refer to this sheet in the next few weeks. These instructions provide you with information on caring for yourself after your procedure. Your health care provider may also give you more specific instructions. Your treatment has been planned according to current medical practices, but problems sometimes occur. Call your health care provider if you have any problems or questions after your procedure. °WHAT TO EXPECT AFTER THE PROCEDURE °After your procedure, it is typical to have the following sensations: °· Some redness on the skin where the shocks were delivered. If this is tender, a sunburn lotion or hydrocortisone cream may help. °· Possible return of an abnormal heart rhythm within hours or days after the procedure. °HOME CARE INSTRUCTIONS °· Take medicines only as directed by your health care provider. Be sure you understand how and when to take your medicine. °· Learn how to feel your pulse and check it often. °· Limit your activity for 48 hours after the procedure or as directed by your health care provider. °· Avoid or minimize caffeine and other stimulants as directed by your health care provider. °SEEK MEDICAL CARE IF: °· You feel like your heart is beating too fast or your pulse is not regular. °· You have any questions about your medicines. °· You have bleeding that will not stop. °SEEK IMMEDIATE MEDICAL CARE IF: °· You are dizzy or feel faint. °· It is hard to breathe or you feel short of breath. °· There is a change in discomfort in your chest. °· Your speech is slurred or you have trouble moving an arm or leg on one side of your body. °· You get a serious muscle cramp that does not go away. °· Your fingers or toes turn cold or blue. °  °This information is not intended to replace advice given to you by your health care provider. Make sure you discuss any questions you have with your health care provider. °  °Document Released: 09/20/2013 Document Revised: 12/21/2014  Document Reviewed: 09/20/2013 °Elsevier Interactive Patient Education ©2016 [employer] ° °

## 2016-04-16 NOTE — Interval H&P Note (Signed)
History and Physical Interval Note:  04/16/2016 8:35 AM  [name] [name]  has presented today for surgery, with the diagnosis of A FIB   The various methods of treatment have been discussed with the patient and family. After consideration of risks, benefits and other options for treatment, the patient has consented to  Procedure(s): CARDIOVERSION (N/A) as a surgical intervention .  The patient's history has been reviewed, patient examined, no change in status, stable for surgery.  I have reviewed the patient's chart and labs.  Questions were answered to the patient's satisfaction.     [name]

## 2016-04-16 NOTE — Transfer of Care (Signed)
Immediate Anesthesia Transfer of Care Note  Patient: [name] [name] Six  Procedure(s) Performed: Procedure(s): CARDIOVERSION (N/A)  Patient Location: PACU and Endoscopy Unit  Anesthesia Type:MAC  Level of Consciousness: awake, alert , oriented and patient cooperative  Airway & Oxygen Therapy: Patient Spontanous Breathing and Patient connected to nasal cannula oxygen  Post-op Assessment: Report given to RN, Post -op Vital signs reviewed and stable and Patient moving all extremities  Post vital signs: Reviewed and stable  Last Vitals:  Filed Vitals:   04/16/16 0818  BP: 145/100  Pulse: 81  Temp: 36.5 C  Resp: 20    Last Pain: There were no vitals filed for this visit.       Complications: No apparent anesthesia complications

## 2016-04-17 ENCOUNTER — Encounter (HOSPITAL_COMMUNITY): Payer: Self-pay | Admitting: Cardiology

## 2016-04-22 ENCOUNTER — Ambulatory Visit (INDEPENDENT_AMBULATORY_CARE_PROVIDER_SITE_OTHER): Payer: Self-pay | Admitting: Cardiothoracic Surgery

## 2016-04-22 ENCOUNTER — Encounter: Payer: Self-pay | Admitting: Cardiothoracic Surgery

## 2016-04-22 VITALS — BP 119/81 | HR 64 | Resp 16 | Ht 62.0 in | Wt 131.4 lb

## 2016-04-22 DIAGNOSIS — J90 Pleural effusion, not elsewhere classified: Secondary | ICD-10-CM

## 2016-04-22 DIAGNOSIS — Z951 Presence of aortocoronary bypass graft: Secondary | ICD-10-CM

## 2016-04-22 DIAGNOSIS — J948 Other specified pleural conditions: Secondary | ICD-10-CM

## 2016-04-22 NOTE — Progress Notes (Signed)
PCP is Joycelyn Rua, MD Referring Provider is Lennette Bihari, MD  Chief Complaint  Patient presents with  . Routine Post Op    1 wk f/u for left pl effusion    HPI:the patient returns for schedule followup after multivessel CABG x4 Earlier this week the patient underwent outpatient cardioversion for postop A. Fib The patient is taking  Eliquis, held for the past 3 doses Last chest x-ray shows a moderate left pleural effusion which is confirmed by exam today. The patient underwent left thoracentesis with removal of 500 cc of clear xanthochromic fluid He is taking daily Lasix   Past Medical History  Diagnosis Date  . Atrial fibrillation (HCC) 03/21/2016    Past Surgical History  Procedure Laterality Date  . Cardiac catheterization N/A 03/04/2016    Procedure: Right/Left Heart Cath and Coronary Angiography;  Surgeon: Lennette Bihari, MD;  Location: [facility] INVASIVE CV LAB;  Service: Cardiovascular;  Laterality: N/A;  . Coronary artery bypass graft N/A 03/10/2016    Procedure: CORONARY ARTERY BYPASS GRAFTING (CABG)TIMES 4 USING LEFT INTERNAL MAMMARY AND BILATERAL GREATER SAPHENOUS VEIN HARVESTED BY ENDOVIEN;  Surgeon: Kerin Perna, MD;  Location: [facility] OR;  Service: Open Heart Surgery;  Laterality: N/A;  . Tee without cardioversion N/A 03/10/2016    Procedure: TRANSESOPHAGEAL ECHOCARDIOGRAM (TEE);  Surgeon: Kerin Perna, MD;  Location: [facility] OR;  Service: Open Heart Surgery;  Laterality: N/A;  . Clipping of atrial appendage Left 03/10/2016    Procedure: CLIPPING OF ATRIAL APPENDAGE;  Surgeon: Kerin Perna, MD;  Location: [facility] OR;  Service: Open Heart Surgery;  Laterality: Left;  . Endarterectomy Left 03/10/2016    Procedure: LEFT ENDARTERECTOMY CAROTID DACRON PATCH ANGIOPLASTY;  Surgeon: Sherren Kerns, MD;  Location: [facility] OR;  Service: Vascular;  Laterality: Left;  . Cardioversion N/A 04/16/2016    Procedure: CARDIOVERSION;  Surgeon: Jake Bathe, MD;  Location: [facility] ENDOSCOPY;  Service:  Cardiovascular;  Laterality: N/A;    No family history on file.  Social History Social History  Substance Use Topics  . Smoking status: Former Games developer  . Smokeless tobacco: None  . Alcohol Use: None    Current Outpatient Prescriptions  Medication Sig Dispense Refill  . amiodarone (PACERONE) 200 MG tablet Take 1 tablet (200 mg total) by mouth daily. 30 tablet 1  . Ascorbic Acid (VITAMIN C) 1000 MG tablet Take 1,000 mg by mouth daily.    Marland Kitchen aspirin 81 MG tablet Take 81 mg by mouth every Monday, Wednesday, and Friday.    Marland Kitchen atorvastatin (LIPITOR) 80 MG tablet Take 1 tablet (80 mg total) by mouth daily at 6 PM. 30 tablet 1  . carvedilol (COREG) 3.125 MG tablet Take 1 tablet (3.125 mg total) by mouth 2 (two) times daily with a meal. 60 tablet 1  . furosemide (LASIX) 40 MG tablet Take 1 tablet (40 mg total) by mouth daily. 30 tablet 1  . magnesium 30 MG tablet Take 30 mg by mouth daily.    . pantoprazole (PROTONIX) 40 MG tablet Take 1 tablet (40 mg total) by mouth daily. 30 tablet 1  . potassium chloride (K-DUR) 10 MEQ tablet Take 1 tablet (10 mEq total) by mouth daily. 30 tablet 1  . apixaban (ELIQUIS) 5 MG TABS tablet Take 1 tablet (5 mg total) by mouth 2 (two) times daily. (Patient not taking: Reported on 04/22/2016) 60 tablet 1   No current facility-administered medications for this visit.    No Known Allergies  Review of Systems  Improved strength and activity tolerance  BP 119/81 mmHg  Pulse 64  Resp 16  Ht 5\' 2"  (1.575 m)  Wt 131 lb 6.4 oz (59.603 kg)  BMI 24.03 kg/m2  SpO2 97% Physical Exam Alert and comfortable Breath sounds improved at left base after thoracentesis of 500 cc fluid Right leg edema has improved Sternal incision well-healed Heart rhythm regular-he is in sinus rhythm after cardioversion  Diagnostic Tests: Most recent chest x-ray personally reviewed  Impression: Left pleural effusion postop CABG Treated with thoracentesis today Continue daily Lasix  and followup in office with chest x-ray in 2 weeks  Plan: Return for office visit in 2 weeks with chest x-ray The patient was told he could resume driving and  light activities,  lifting limits up to 15 pounds  Mikey BussingPeter Van Trigt III, MD [facility] [phone]

## 2016-04-27 DIAGNOSIS — I25810 Atherosclerosis of coronary artery bypass graft(s) without angina pectoris: Secondary | ICD-10-CM | POA: Diagnosis not present

## 2016-04-27 DIAGNOSIS — I4891 Unspecified atrial fibrillation: Secondary | ICD-10-CM | POA: Diagnosis not present

## 2016-05-05 ENCOUNTER — Other Ambulatory Visit: Payer: Self-pay | Admitting: *Deleted

## 2016-05-05 DIAGNOSIS — Z951 Presence of aortocoronary bypass graft: Secondary | ICD-10-CM

## 2016-05-06 ENCOUNTER — Ambulatory Visit
Admission: RE | Admit: 2016-05-06 | Discharge: 2016-05-06 | Disposition: A | Discharge status: Home / Self Care | Payer: 59 | Source: Ambulatory Visit | Attending: Cardiothoracic Surgery | Admitting: Cardiothoracic Surgery

## 2016-05-06 ENCOUNTER — Encounter: Payer: Self-pay | Admitting: Cardiothoracic Surgery

## 2016-05-06 DIAGNOSIS — Z951 Presence of aortocoronary bypass graft: Secondary | ICD-10-CM

## 2016-05-06 DIAGNOSIS — J90 Pleural effusion, not elsewhere classified: Secondary | ICD-10-CM | POA: Diagnosis not present

## 2016-05-08 ENCOUNTER — Encounter: Payer: Self-pay | Admitting: Cardiothoracic Surgery

## 2016-05-08 ENCOUNTER — Ambulatory Visit (INDEPENDENT_AMBULATORY_CARE_PROVIDER_SITE_OTHER): Payer: Self-pay | Admitting: Cardiothoracic Surgery

## 2016-05-08 VITALS — BP 140/90 | HR 68 | Resp 20 | Ht 62.0 in | Wt 131.0 lb

## 2016-05-08 DIAGNOSIS — J948 Other specified pleural conditions: Secondary | ICD-10-CM

## 2016-05-08 DIAGNOSIS — J90 Pleural effusion, not elsewhere classified: Secondary | ICD-10-CM

## 2016-05-08 DIAGNOSIS — Z951 Presence of aortocoronary bypass graft: Secondary | ICD-10-CM

## 2016-05-08 NOTE — Addendum Note (Signed)
Addended by: Sharee PimpleMCCHESNEY, MARILYN K on: 05/08/2016 01:14 PM   Modules accepted: Orders

## 2016-05-08 NOTE — Progress Notes (Signed)
PCP is Joycelyn RuaMEYERS, STEPHEN, MD Referring Provider is Joycelyn RuaMeyers, Stephen, MD  Chief Complaint  Patient presents with  . Routine Post Op    2 week f/u with CXR       [id number]:patient returns for further followup and review of a postop left pleural effusion. At his last office visit I did a thoracentesis and removed 500 cc from the left pleural space-serosanguineous, inflammatory He feels much better. Chest x-ray today shows improvement on the left side with only minimal recurrent effusion. This should resolve with further improvement in diet and activity.   Past Medical History  Diagnosis Date  . Atrial fibrillation (HCC) 03/21/2016    Past Surgical History  Procedure Laterality Date  . Cardiac catheterization N/A 03/04/2016    Procedure: Right/Left Heart Cath and Coronary Angiography;  Surgeon: Lennette Biharihomas A Kelly, MD;  [facility];  Service: Cardiovascular;  Laterality: N/A;  . Coronary artery bypass graft N/A 03/10/2016    Procedure: CORONARY ARTERY BYPASS GRAFTING (CABG)TIMES 4 USING LEFT INTERNAL MAMMARY AND BILATERAL GREATER SAPHENOUS VEIN HARVESTED BY ENDOVIEN;  Surgeon: Kerin PernaPeter Van Trigt, MD;  Location: [facility] OR;  Service: Open Heart Surgery;  Laterality: N/A;  . Tee without cardioversion N/A 03/10/2016    Procedure: TRANSESOPHAGEAL ECHOCARDIOGRAM (TEE);  Surgeon: Kerin PernaPeter Van Trigt, MD;  [facility];  Service: Open Heart Surgery;  Laterality: N/A;  . Clipping of atrial appendage Left 03/10/2016    Procedure: CLIPPING OF ATRIAL APPENDAGE;  Surgeon: Kerin PernaPeter Van Trigt, MD;  [facility];  Service: Open Heart Surgery;  Laterality: Left;  . Endarterectomy Left 03/10/2016    Procedure: LEFT ENDARTERECTOMY CAROTID DACRON PATCH ANGIOPLASTY;  Surgeon: Sherren Kernsharles E Fields, MD;  [facility];  Service: Vascular;  Laterality: Left;  . Cardioversion N/A 04/16/2016    Procedure: CARDIOVERSION;  Surgeon: Jake BatheMark C Skains, MD;  [facility];  Service: Cardiovascular;  Laterality: N/A;    No  family history on file.  Social History Social History  Substance Use Topics  . Smoking status: Former Games developermoker  . Smokeless tobacco: None  . Alcohol Use: None    Current Outpatient Prescriptions  Medication Sig Dispense Refill  . amiodarone (PACERONE) 200 MG tablet Take 1 tablet (200 mg total) by mouth daily. 30 tablet 1  . apixaban (ELIQUIS) 5 MG TABS tablet Take 1 tablet (5 mg total) by mouth 2 (two) times daily. 60 tablet 1  . Ascorbic Acid (VITAMIN C) 1000 MG tablet Take 1,000 mg by mouth daily.    Marland Kitchen. aspirin 81 MG tablet Take 81 mg by mouth every Monday, Wednesday, and Friday.    Marland Kitchen. atorvastatin (LIPITOR) 80 MG tablet Take 1 tablet (80 mg total) by mouth daily at 6 PM. 30 tablet 1  . carvedilol (COREG) 3.125 MG tablet Take 1 tablet (3.125 mg total) by mouth 2 (two) times daily with a meal. 60 tablet 1  . furosemide (LASIX) 40 MG tablet Take 1 tablet (40 mg total) by mouth daily. 30 tablet 1  . magnesium 30 MG tablet Take 30 mg by mouth daily.    . pantoprazole (PROTONIX) 40 MG tablet Take 1 tablet (40 mg total) by mouth daily. 30 tablet 1  . potassium chloride (K-DUR) 10 MEQ tablet Take 1 tablet (10 mEq total) by mouth daily. 30 tablet 1   No current facility-administered medications for this visit.    No Known Allergies  Review of Systems  Doing well asymptomatic He was shocked out of atrial fibrillation and is maintaining  sinus rhythm.-Followed by cardiology  BP 140/90 mmHg  Pulse 68  Resp 20  Ht  (1.575 m)  Wt 131 lb (59.421 kg)  BMI 23.95 kg/m2  SpO2  Physical Exam Alert and comfortable Breath sounds clear and equal Heart rhythm regular Surgical incision is well-healed  Diagnostic Tests: Chest x-ray taken today personally reviewed shows improvement after left thoracentesis with minimal residual effusion  Impression: Doing well almost 3 months after surgery. We talked about  increasing activities and it is satisfactory for him to ride a lawnmower at this  time. He knows not to lift more than 20 pounds until 3 months after surgery. Plan:return as needed   Mikey Bussing, MD [facility] [phone]

## 2016-05-19 ENCOUNTER — Other Ambulatory Visit: Payer: Self-pay

## 2016-05-19 MED ORDER — CARVEDILOL 3.125 MG PO TABS: 3.1250 mg | ORAL_TABLET | Freq: Two times a day (BID) | ORAL | Status: DC

## 2016-05-19 MED ORDER — AMIODARONE HCL 200 MG PO TABS: 200.0000 mg | ORAL_TABLET | Freq: Every day | ORAL | Status: DC

## 2016-05-19 MED ORDER — PANTOPRAZOLE SODIUM 40 MG PO TBEC
40.0000 mg | DELAYED_RELEASE_TABLET | Freq: Every day | ORAL | Status: DC
Start: 2016-05-19 — End: 2016-12-03

## 2016-05-19 MED ORDER — ATORVASTATIN CALCIUM 80 MG PO TABS: 80.0000 mg | ORAL_TABLET | Freq: Every day | ORAL | Status: DC

## 2016-05-19 MED ORDER — FUROSEMIDE 40 MG PO TABS: 40.0000 mg | ORAL_TABLET | Freq: Every day | ORAL | Status: DC

## 2016-05-19 MED ORDER — APIXABAN 5 MG PO TABS: 5.0000 mg | ORAL_TABLET | Freq: Two times a day (BID) | ORAL | Status: DC

## 2016-05-19 MED ORDER — POTASSIUM CHLORIDE ER 10 MEQ PO TBCR: 10.0000 meq | EXTENDED_RELEASE_TABLET | Freq: Every day | ORAL | Status: DC

## 2016-11-04 ENCOUNTER — Encounter: Payer: Self-pay | Admitting: Family

## 2016-11-12 ENCOUNTER — Encounter: Payer: Self-pay | Admitting: Family

## 2016-11-12 ENCOUNTER — Ambulatory Visit (INDEPENDENT_AMBULATORY_CARE_PROVIDER_SITE_OTHER): Payer: Commercial Managed Care - HMO | Admitting: Family

## 2016-11-12 ENCOUNTER — Ambulatory Visit (HOSPITAL_COMMUNITY)
Admission: RE | Admit: 2016-11-12 | Discharge: 2016-11-12 | Disposition: A | Discharge status: Home / Self Care | Payer: Commercial Managed Care - HMO | Source: Ambulatory Visit | Attending: Family | Admitting: Family

## 2016-11-12 VITALS — BP 159/102 | HR 68 | Temp 97.0°F | Ht 62.0 in | Wt 135.0 lb

## 2016-11-12 DIAGNOSIS — Z9889 Other specified postprocedural states: Secondary | ICD-10-CM | POA: Diagnosis not present

## 2016-11-12 DIAGNOSIS — I6522 Occlusion and stenosis of left carotid artery: Secondary | ICD-10-CM

## 2016-11-12 DIAGNOSIS — R0989 Other specified symptoms and signs involving the circulatory and respiratory systems: Secondary | ICD-10-CM

## 2016-11-12 LAB — VAS US CAROTID
LCCAPDIAS: 23 cm/s
LCCAPSYS: 107 cm/s
LEFT ECA DIAS: -9 cm/s
LEFT VERTEBRAL DIAS: 25 cm/s
Left CCA dist dias: -10 cm/s
Left CCA dist sys: -46 cm/s
Left ICA dist dias: -31 cm/s
Left ICA dist sys: -104 cm/s
Left ICA prox dias: -29 cm/s
Left ICA prox sys: -115 cm/s
RCCADSYS: -86 cm/s
RCCAPDIAS: 16 cm/s
RCCAPSYS: 91 cm/s
RIGHT CCA MID DIAS: 22 cm/s
RIGHT ECA DIAS: -10 cm/s
RIGHT VERTEBRAL DIAS: 5 cm/s

## 2016-11-12 NOTE — Patient Instructions (Signed)
Stroke Prevention Some medical conditions and behaviors are associated with an increased chance of having a stroke. You may prevent a stroke by making healthy choices and managing medical conditions. How can I reduce my risk of having a stroke?  Stay physically active. Get at least 30 minutes of activity on most or all days.  Do not smoke. It may also be helpful to avoid exposure to secondhand smoke.  Limit alcohol use. Moderate alcohol use is considered to be:  No more than 2 drinks per day for men.  No more than 1 drink per day for nonpregnant women.  Eat healthy foods. This involves:  Eating 5 or more servings of fruits and vegetables a day.  Making dietary changes that address high blood pressure (hypertension), high cholesterol, diabetes, or obesity.  Manage your cholesterol levels.  Making food choices that are high in fiber and low in saturated fat, trans fat, and cholesterol may control cholesterol levels.  Take any prescribed medicines to control cholesterol as directed by your health care provider.  Manage your diabetes.  Controlling your carbohydrate and sugar intake is recommended to manage diabetes.  Take any prescribed medicines to control diabetes as directed by your health care provider.  Control your hypertension.  Making food choices that are low in salt (sodium), saturated fat, trans fat, and cholesterol is recommended to manage hypertension.  Ask your health care provider if you need treatment to lower your blood pressure. Take any prescribed medicines to control hypertension as directed by your health care provider.  If you are 18-39 years of age, have your blood pressure checked every 3-5 years. If you are 40 years of age or older, have your blood pressure checked every year.  Maintain a healthy weight.  Reducing calorie intake and making food choices that are low in sodium, saturated fat, trans fat, and cholesterol are recommended to manage  weight.  Stop drug abuse.  Avoid taking birth control pills.  Talk to your health care provider about the risks of taking birth control pills if you are over 35 years old, smoke, get migraines, or have ever had a blood clot.  Get evaluated for sleep disorders (sleep apnea).  Talk to your health care provider about getting a sleep evaluation if you snore a lot or have excessive sleepiness.  Take medicines only as directed by your health care provider.  For some people, aspirin or blood thinners (anticoagulants) are helpful in reducing the risk of forming abnormal blood clots that can lead to stroke. If you have the irregular heart rhythm of atrial fibrillation, you should be on a blood thinner unless there is a good reason you cannot take them.  Understand all your medicine instructions.  Make sure that other conditions (such as anemia or atherosclerosis) are addressed. Get help right away if:  You have sudden weakness or numbness of the face, arm, or leg, especially on one side of the body.  Your face or eyelid droops to one side.  You have sudden confusion.  You have trouble speaking (aphasia) or understanding.  You have sudden trouble seeing in one or both eyes.  You have sudden trouble walking.  You have dizziness.  You have a loss of balance or coordination.  You have a sudden, severe headache with no known cause.  You have new chest pain or an irregular heartbeat. Any of these symptoms may represent a serious problem that is an emergency. Do not wait to see if the symptoms will go away.   Get medical help at once. Call your local emergency services (911 in [place] Do not drive yourself to the hospital. This information is not intended to replace advice given to you by your health care provider. Make sure you discuss any questions you have with your health care provider. Document Released: 01/07/2005 Document Revised: 05/07/2016 Document Reviewed: 06/02/2013 Elsevier  Interactive Patient Education  2017 Elsevier Inc.  

## 2016-11-12 NOTE — Progress Notes (Signed)
Chief Complaint: Follow up Extracranial Carotid Artery Stenosis   History of Present Illness  [name] [name] is a 75 y.o. malepatient of Dr. Darrick Penna who is s/p left carotid endarterectomy combined with coronary artery bypass grafting March 10, 2016.   He was cardioverted in May 2017 for atrial fib.   He reports left calf "cramping" after walking about 200 feet, relieved by rest; he noticed this after his CABG and CEA.  He states loss of weight and appetite since the above surgeries, but denies post prandial abdominal pain.   He denies chest pain, denies dyspnea, denies headache, denies dizziness.   He denies any known history of stroke or TIA. Specifically he denies a history of amaurosis fugax or monocular blindness, unilateral facial drooping, hemiplegia, or receptive or expressive aphasia.   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   Pt Diabetic: no Pt smoker: former smoker, quit about 1982, started about age 32 years  Pt meds include: Statin : yes ASA: no Other anticoagulants/antiplatelets: Eliquis prescribed by Dr. Nicki Guadalajara   Past Medical History:  Diagnosis Date  . Atrial fibrillation (HCC) 03/21/2016    Social History Social History  Substance Use Topics  . Smoking status: Former Games developer  . Smokeless tobacco: Former Neurosurgeon  . Alcohol use Not on file    Family History History reviewed. No pertinent family history.  Surgical History Past Surgical History:  Procedure Laterality Date  . CARDIAC CATHETERIZATION N/A 03/04/2016   Procedure: Right/Left Heart Cath and Coronary Angiography;  Surgeon: Lennette Bihari, MD;  Location: [facility] INVASIVE CV LAB;  Service: Cardiovascular;  Laterality: N/A;  . CARDIOVERSION N/A 04/16/2016   Procedure: CARDIOVERSION;  Surgeon: Jake Bathe, MD;  Location: [facility] ENDOSCOPY;  Service: Cardiovascular;   Laterality: N/A;  . CLIPPING OF ATRIAL APPENDAGE Left 03/10/2016   Procedure: CLIPPING OF ATRIAL APPENDAGE;  Surgeon: Kerin Perna, MD;  Location: [facility] OR;  Service: Open Heart Surgery;  Laterality: Left;  . CORONARY ARTERY BYPASS GRAFT N/A 03/10/2016   Procedure: CORONARY ARTERY BYPASS GRAFTING (CABG)TIMES 4 USING LEFT INTERNAL MAMMARY AND BILATERAL GREATER SAPHENOUS VEIN HARVESTED BY ENDOVIEN;  Surgeon: Kerin Perna, MD;  Location: [facility] OR;  Service: Open Heart Surgery;  Laterality: N/A;  . ENDARTERECTOMY Left 03/10/2016   Procedure: LEFT ENDARTERECTOMY CAROTID DACRON PATCH ANGIOPLASTY;  Surgeon: Sherren Kerns, MD;  Location: [facility] OR;  Service: Vascular;  Laterality: Left;  . TEE WITHOUT CARDIOVERSION N/A 03/10/2016   Procedure: TRANSESOPHAGEAL ECHOCARDIOGRAM (TEE);  Surgeon: Kerin Perna, MD;  Location: [facility] OR;  Service: Open Heart Surgery;  Laterality: N/A;    No Known Allergies  Current Outpatient Prescriptions  Medication Sig Dispense Refill  . amiodarone (PACERONE) 200 MG tablet Take 1 tablet (200 mg total) by mouth daily. 30 tablet 6  . apixaban (ELIQUIS) 5 MG TABS tablet Take 1 tablet (5 mg total) by mouth 2 (two) times daily. 60 tablet 6  . Ascorbic Acid (VITAMIN C) 1000 MG tablet Take 1,000 mg by mouth daily.    Marland Kitchen aspirin 81 MG tablet Take 81 mg by mouth every Monday, Wednesday, and Friday.    Marland Kitchen atorvastatin (LIPITOR) 80 MG tablet Take 1 tablet (80 mg total) by mouth daily at 6 PM. 30 tablet 6  . carvedilol (COREG) 3.125 MG tablet Take 1 tablet (3.125 mg total) by mouth 2 (two) times  daily with a meal. 60 tablet 6  . furosemide (LASIX) 40 MG tablet Take 1 tablet (40 mg total) by mouth daily. 30 tablet 6  . magnesium 30 MG tablet Take 30 mg by mouth daily.    . pantoprazole (PROTONIX) 40 MG tablet Take 1 tablet (40 mg total) by mouth daily. 30 tablet 6  . potassium chloride (K-DUR) 10 MEQ tablet Take 1 tablet (10 mEq total) by mouth daily. 30 tablet 6   No current  facility-administered medications for this visit.     Review of Systems : See HPI for pertinent positives and negatives.  Physical Examination  Vitals:   11/12/16 1123 11/12/16 1125  BP: (!) 214/97 (!) 159/102  Pulse: 68   Temp: 97 F (36.1 C)   SpO2: 95%   Weight: 135 lb (61.2 kg)   Height: 5\' 2"  (1.575 m)    Body mass index is 24.69 kg/m.  General: WDWN male in NAD GAIT: normal Eyes: PERRLA Pulmonary:  Respirations are non-labored, good air movement, CTAB  Cardiac: regular rhythm, no appreciable murmur.  VASCULAR EXAM Carotid Bruits Right Left   Positive Negative    Aorta is faintly palpable. Radial pulses are 2+ palpable and equal.                                                                                                                            LE Pulses Right Left       FEMORAL  not palpable  1+ palpable        POPLITEAL  not palpable   not palpable       POSTERIOR TIBIAL  not palpable   not palpable        DORSALIS PEDIS      ANTERIOR TIBIAL 1+ palpable  faintly palpable     Gastrointestinal: soft, nontender, BS WNL, no r/g, no palpable masses.  Musculoskeletal: No muscle atrophy/wasting. M/S 5/5 throughout, extremities without ischemic changes.  Neurologic: A&O X 3; Appropriate Affect, Speech is normal CN 2-12 intact, pain and light touch intact in extremities, Motor exam as listed above.     Assessment: [name] [name] is a 75 y.o. male who is s/p left carotid endarterectomy combined with coronary artery bypass grafting March 10, 2016. He has no history of stroke or TIA.  Decreased pedal pulses with claudication of left calf after walking 200 feet, relieved by rest; no arterial testing of lower extremity results on file, will check ABI's on his return in 6 months for carotid duplex.  There are no signs of ischemia in his feet/legs.   I advised pt to call Dr. Landry DykeKelly's office today to make a follow up appointment since he has not been evaluated  by cardiology since his cardioversion in May of this year; it appears that he was advised at his April 2017 cardiology visit to follow up with Dr. Tresa EndoKelly in 3 months and he has not yet done so.   DATA Today's carotid  duplex suggests 60% stenosis of the right ICA and no restenosis of the left ICA which is the CEA site. Bilateral vertebral artery flow is antegrade. Bilateral subclavian artery waveforms are normal.  Right ICA stenosis is stable and left ICA has improved since the carotid duplex on 03-04-16.  03-04-16 pre CABG carotid duplex: Bilaterally- evidence of ICA stenosis-right side 60-79% and left side 80-99%. Vertebral artery flow is antegrade.  Plan: Follow-up in 6 months with Carotid Duplex scan and ABI's.    I discussed in depth with the patient the nature of atherosclerosis, and emphasized the importance of maximal medical management including strict control of blood pressure, blood glucose, and lipid levels, obtaining regular exercise, and continued cessation of smoking.  The patient is aware that without maximal medical management the underlying atherosclerotic disease process will progress, limiting the benefit of any interventions. The patient was given information about stroke prevention and what symptoms should prompt the patient to seek immediate medical care. Thank you for allowing us to participate in this patient's care.  Charisse MarchSuzanne Bambie Pizzolato, RN, MSN, FNP-C [facility] Office: [phone]  Clinic Physician: Darrick PennaFields  11/12/16 11:30 AM

## 2016-11-12 NOTE — Addendum Note (Signed)
Addended by: Burton ApleyPETTY, Kashlyn Salinas A on: 11/12/2016 01:11 PM   Modules accepted: Orders

## 2016-11-13 IMAGING — CR DG CHEST 1V PORT
1 series · 1 of 1 positions shown · non-contrast
Comparison: 03/13/2016

CLINICAL DATA: Shortness of breath. Congestive heart failure.
Postop from CABG.

EXAM:
PORTABLE CHEST 1 VIEW

[AP]
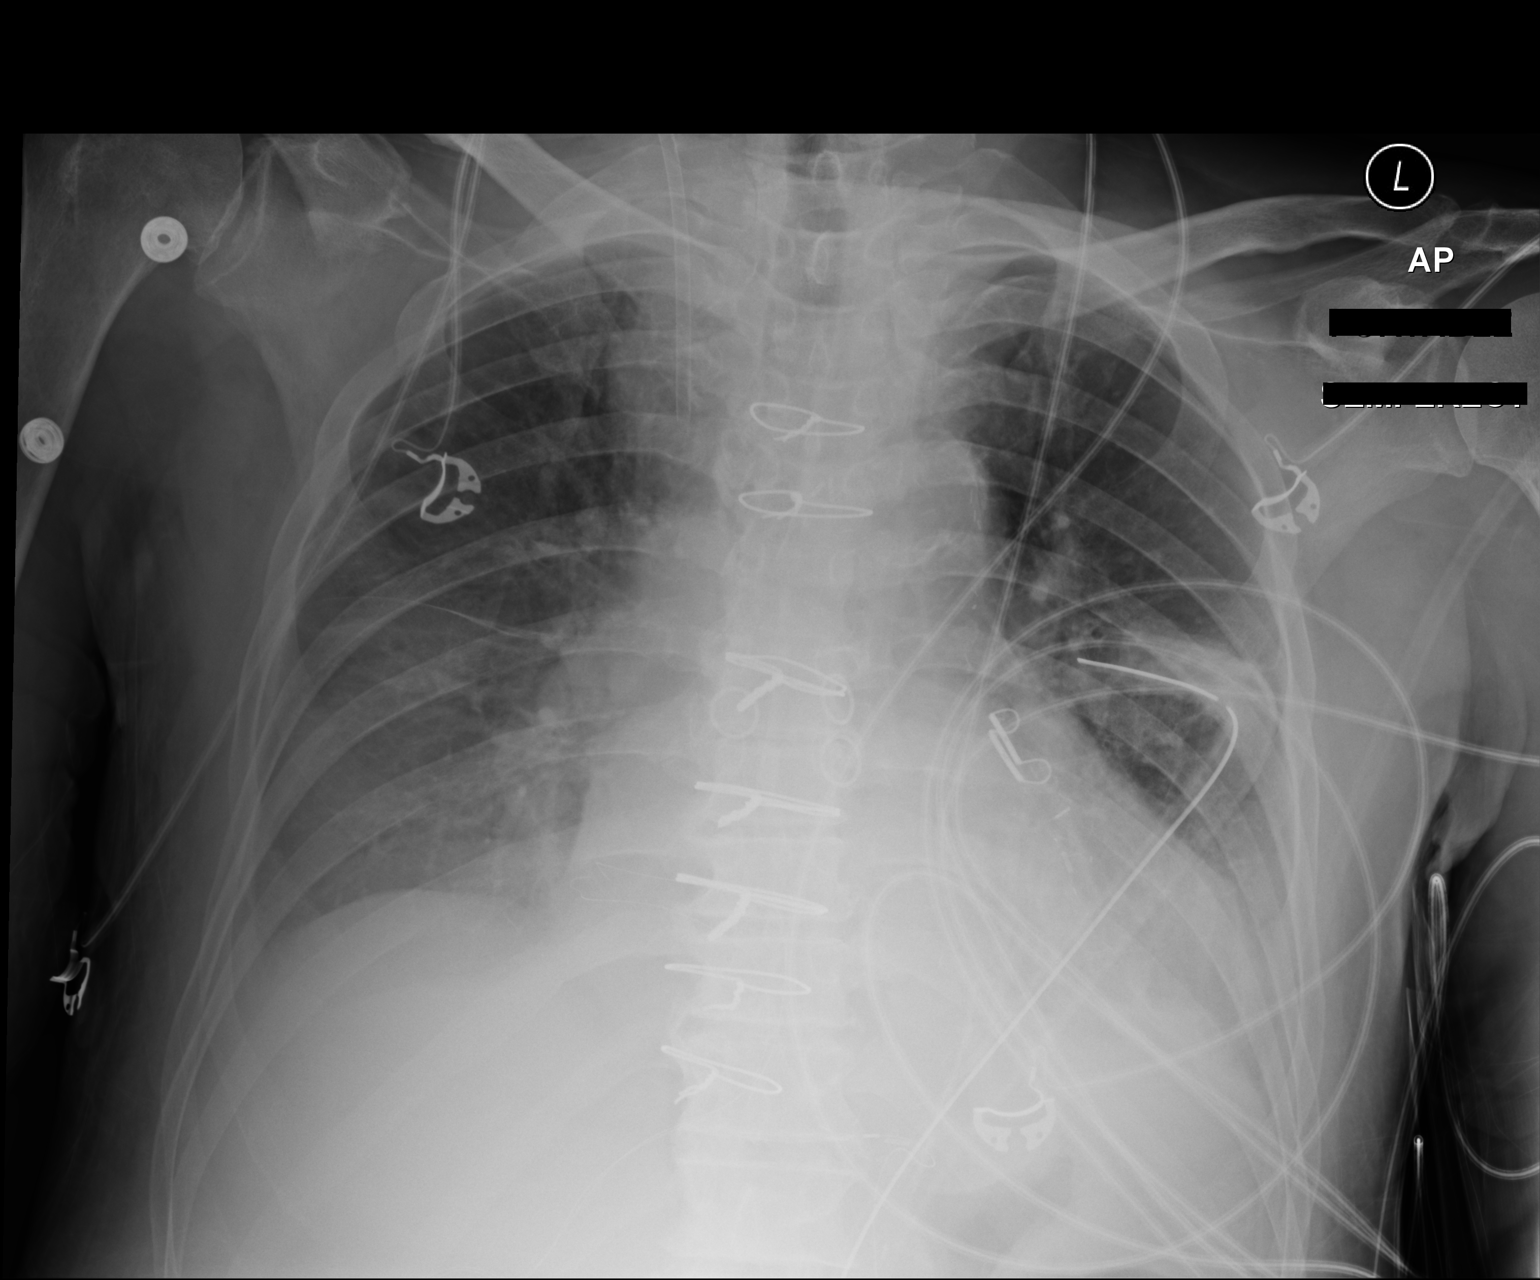

[1 of 1 positions shown; findings below may reference images not displayed]

FINDINGS: Left chest tube and right jugular Cordis remain in place. A small
approximately 5% left apical pneumothorax is seen.

Bibasilar atelectasis and probable small bilateral pleural effusions
show no significant change. Cardiomegaly is stable.
IMPRESSION: Tiny approximately 5% left apical pneumothorax with left chest tube
in place.

No significant change in bibasilar atelectasis and probable small
bilateral pleural effusions.

## 2016-11-14 IMAGING — CR DG CHEST 1V PORT
1 series · 1 of 1 positions shown · non-contrast
Comparison: 03/14/2016 and prior exams

CLINICAL DATA: Status post CABG.

EXAM:
PORTABLE CHEST 1 VIEW

[AP]
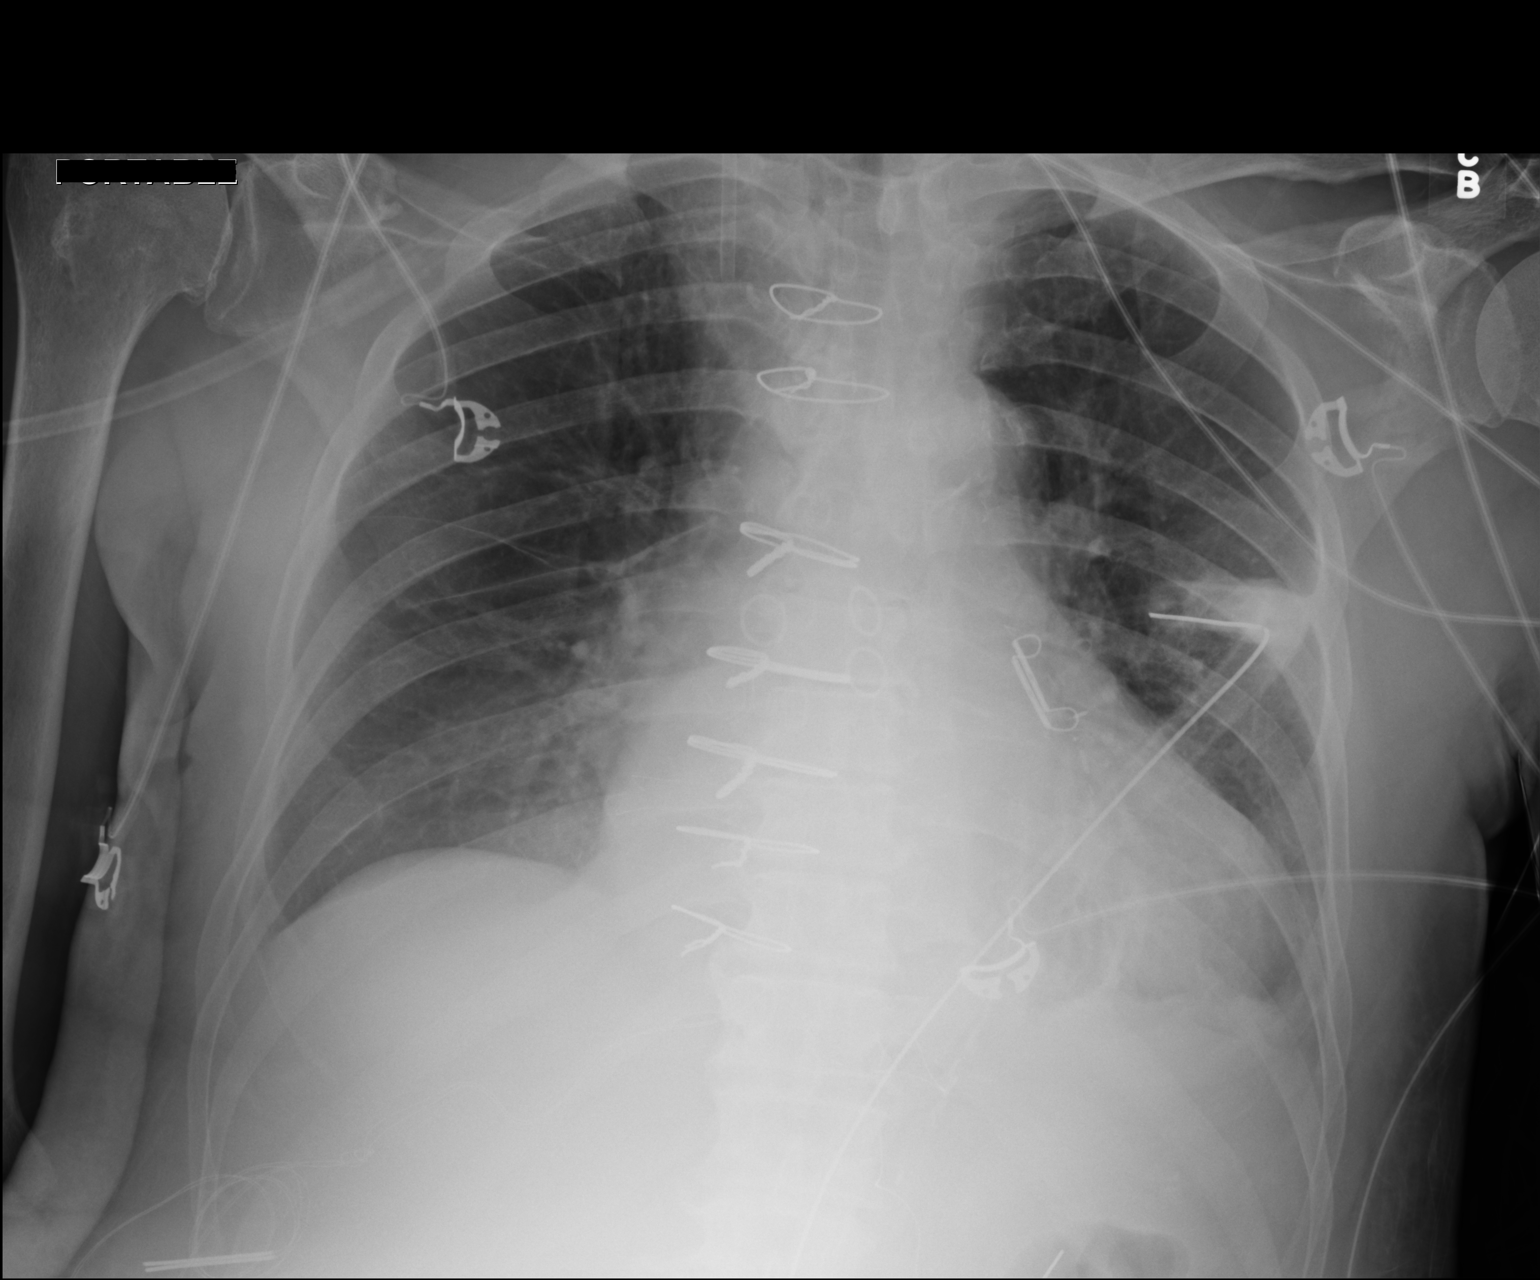

[1 of 1 positions shown; findings below may reference images not displayed]

FINDINGS: Cardiomegaly, CABG changes, right IJ central venous catheter sheath
and left thoracostomy tube again noted.

A tiny left apical pneumothorax (less than 5%) is unchanged.

Improved bibasilar aeration noted with mild left basilar atelectasis
and trace pleural effusions present.

There is no evidence of edema.
IMPRESSION: Improved bibasilar aeration, otherwise unchanged appearance of the
chest. Tiny left apical pneumothorax again noted.

## 2016-11-19 IMAGING — DX DG CHEST 1V PORT
1 series · 1 of 1 positions shown · non-contrast
Comparison: March 17, 2016.

CLINICAL DATA: Congestive heart failure.

EXAM:
PORTABLE CHEST 1 VIEW

[chest ap]
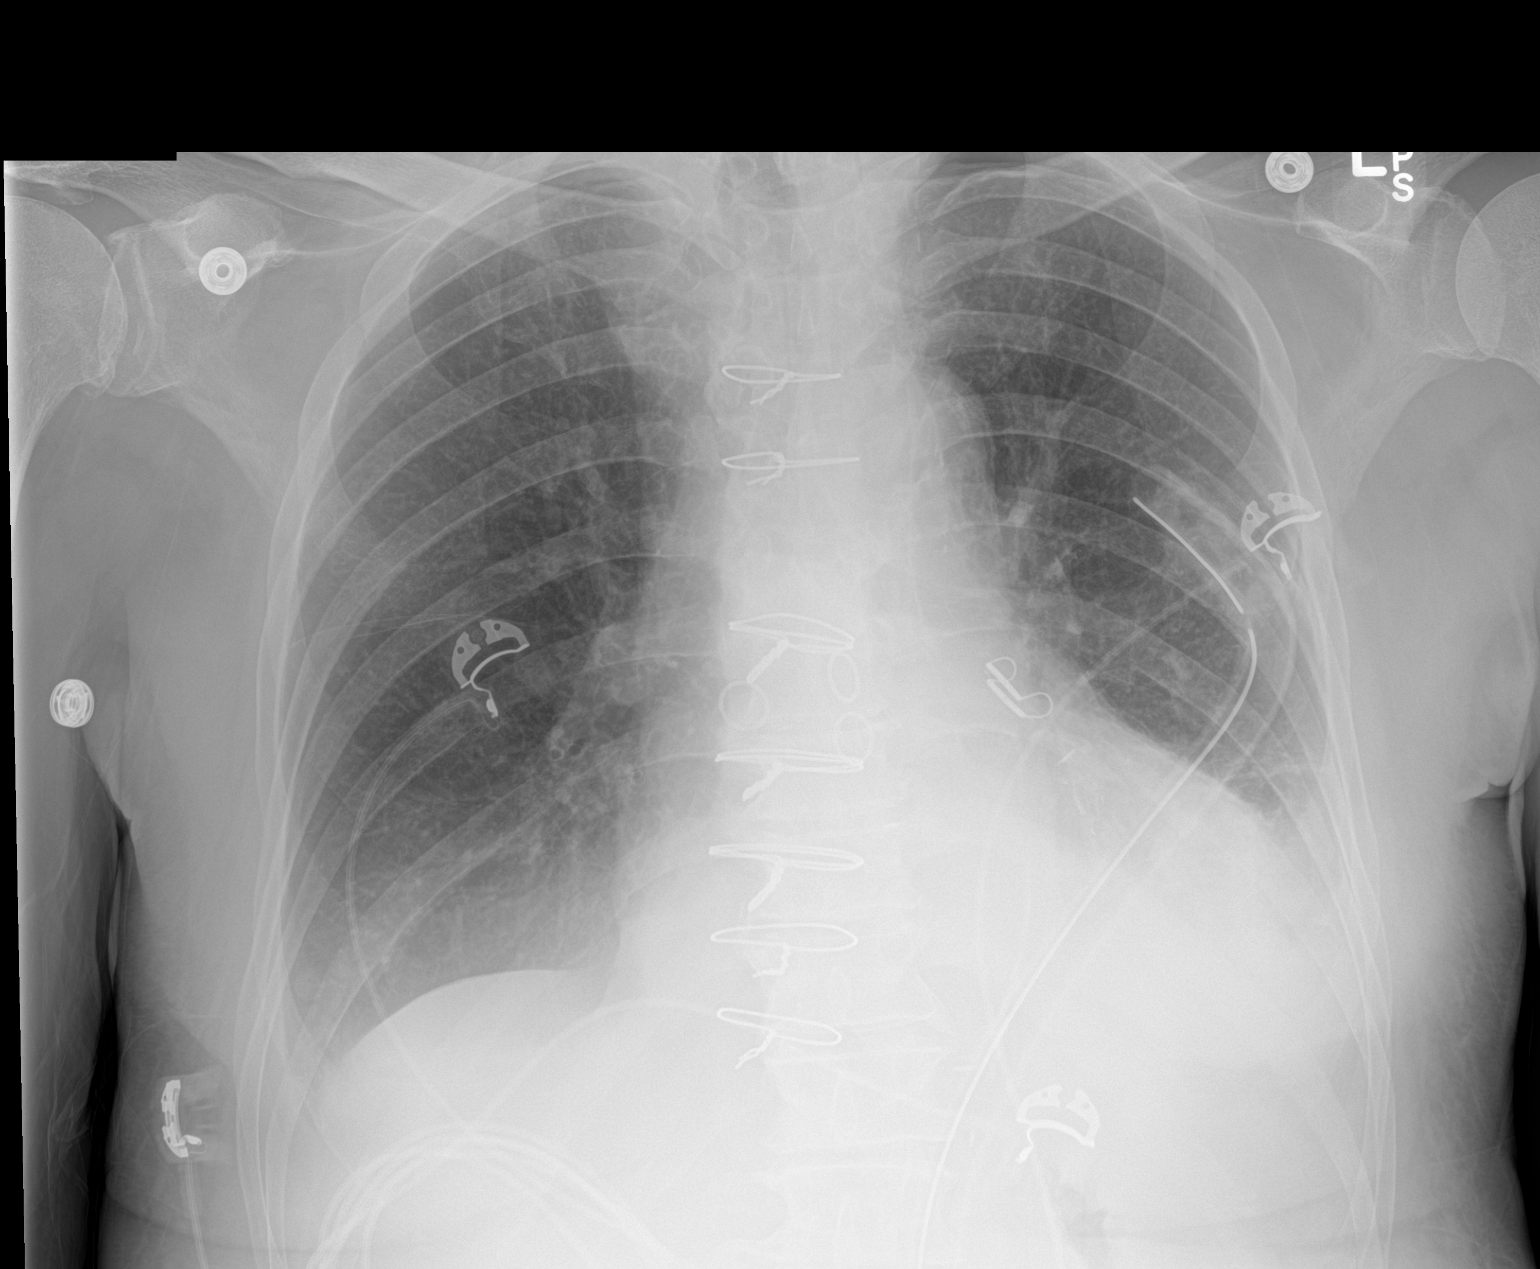

[1 of 1 positions shown; findings below may reference images not displayed]

FINDINGS: Stable cardiomegaly. Status post coronary artery bypass graft.
Left-sided chest tube is unchanged in position. Stable small left
apical pneumothorax is noted. Stable left basilar opacity is noted
concerning for atelectasis or infiltrate with associated pleural
effusion. Stable minimal right pleural effusion is noted.
IMPRESSION: Left-sided chest tube is unchanged with stable small left apical
pneumothorax. Stable bilateral pleural effusions and left basilar
opacity.

## 2016-12-03 ENCOUNTER — Other Ambulatory Visit: Payer: Self-pay | Admitting: Cardiovascular Disease

## 2016-12-09 ENCOUNTER — Telehealth: Payer: Self-pay | Admitting: *Deleted

## 2016-12-09 NOTE — Telephone Encounter (Signed)
Received patient as a walk in regarding medication issue.  Pt requesting to stop eliquis. Pt reports minor nose bleeds (very little, turning a tissue pink tinged). Pt reports the main reason he wants to come off of eliquis is the cost and reports he was told that this medication would not be a long term medication since he was cardioverted and remains in a regular rhythm.    Spoke with pharmacist and provided recommendations to the patient regarding nose bleeds-saline, afrin, humidifier and if unable to stop bleeding or bleeding persist proceed to urgent care or PCP. Advised to continue taking Eliquis.    Advised I would send Dr. Tresa EndoKelly a message for recommendations regarding medication.  Advised to continue taking medication until contacted by us with Dr. Tresa EndoKelly recommendations.  Pt verbalized understanding.  Offered patient assistance program for Eliquis in the case he needs to continue this medication-pt refused at this time.

## 2016-12-17 NOTE — Telephone Encounter (Incomplete)
Please have him follow up with Dr. Tresa EndoKelly or Dr. Duke Salviaandolph who saw him originally. I performed cardioversion at the request of Dr. Maren BeachVanTrigt

## 2016-12-17 NOTE — Telephone Encounter (Signed)
In careful review of his records, last office notes, he was successfully cardioverted by myself and in follow-up with Dr. Maren BeachVanTrigt he noted that he was maintaining sinus rhythm.  Since he had postop atrial fibrillation, he may discontinue his Eliquis at this time. Continue with aspirin. If atrial fibrillation returns, without anticoagulation he is at risk for stroke.  [name] SchultzMark Aime Carreras, MD

## 2016-12-17 NOTE — Telephone Encounter (Signed)
I have not seen pt in office, but did cath in March 2017;  He underwent cardioversion by Dr. Anne FuSkains in May 2017.

## 2016-12-17 NOTE — Telephone Encounter (Signed)
Left message to c/b.

## 2016-12-18 NOTE — Telephone Encounter (Signed)
Pt is aware OK to d/c Eliquis per Dr Anne FuSkains, at this time.  He will continue with ASA.  He is aware he is at greater risk for stroke if At Fib reoccurs.  Advised if he notices increased, irregular heart rate/rhythm to call the office ASAP.  He states understanding.

## 2016-12-18 NOTE — Telephone Encounter (Signed)
Follow Up: ° ° ° °Returning your call from yesterday. °

## 2017-01-05 IMAGING — CR DG CHEST 2V
2 series · 2 of 2 positions shown · non-contrast
Comparison: 04/15/2016

CLINICAL DATA: Status post CABG

EXAM:
CHEST  2 VIEW

[w chest pa]
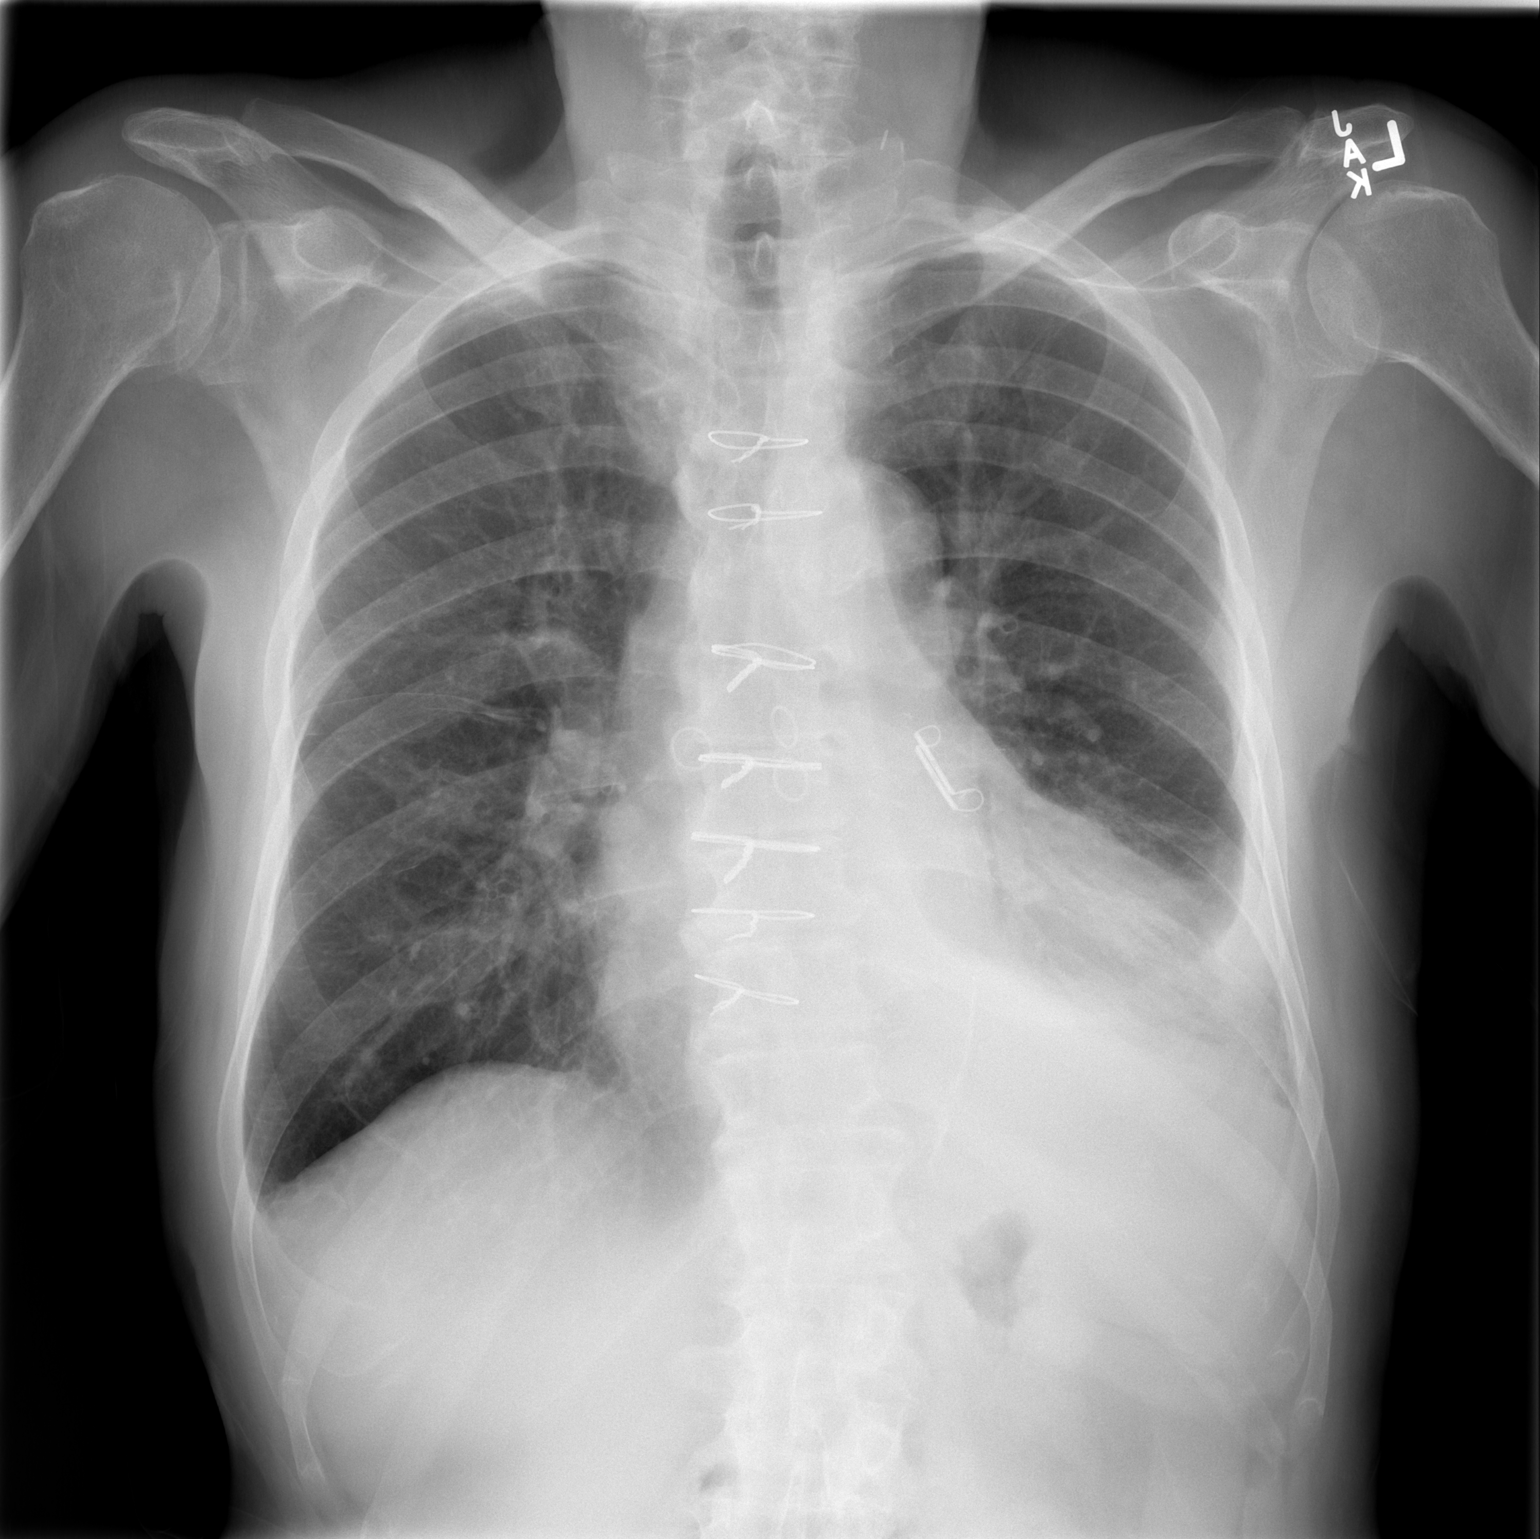

[w chest lat]
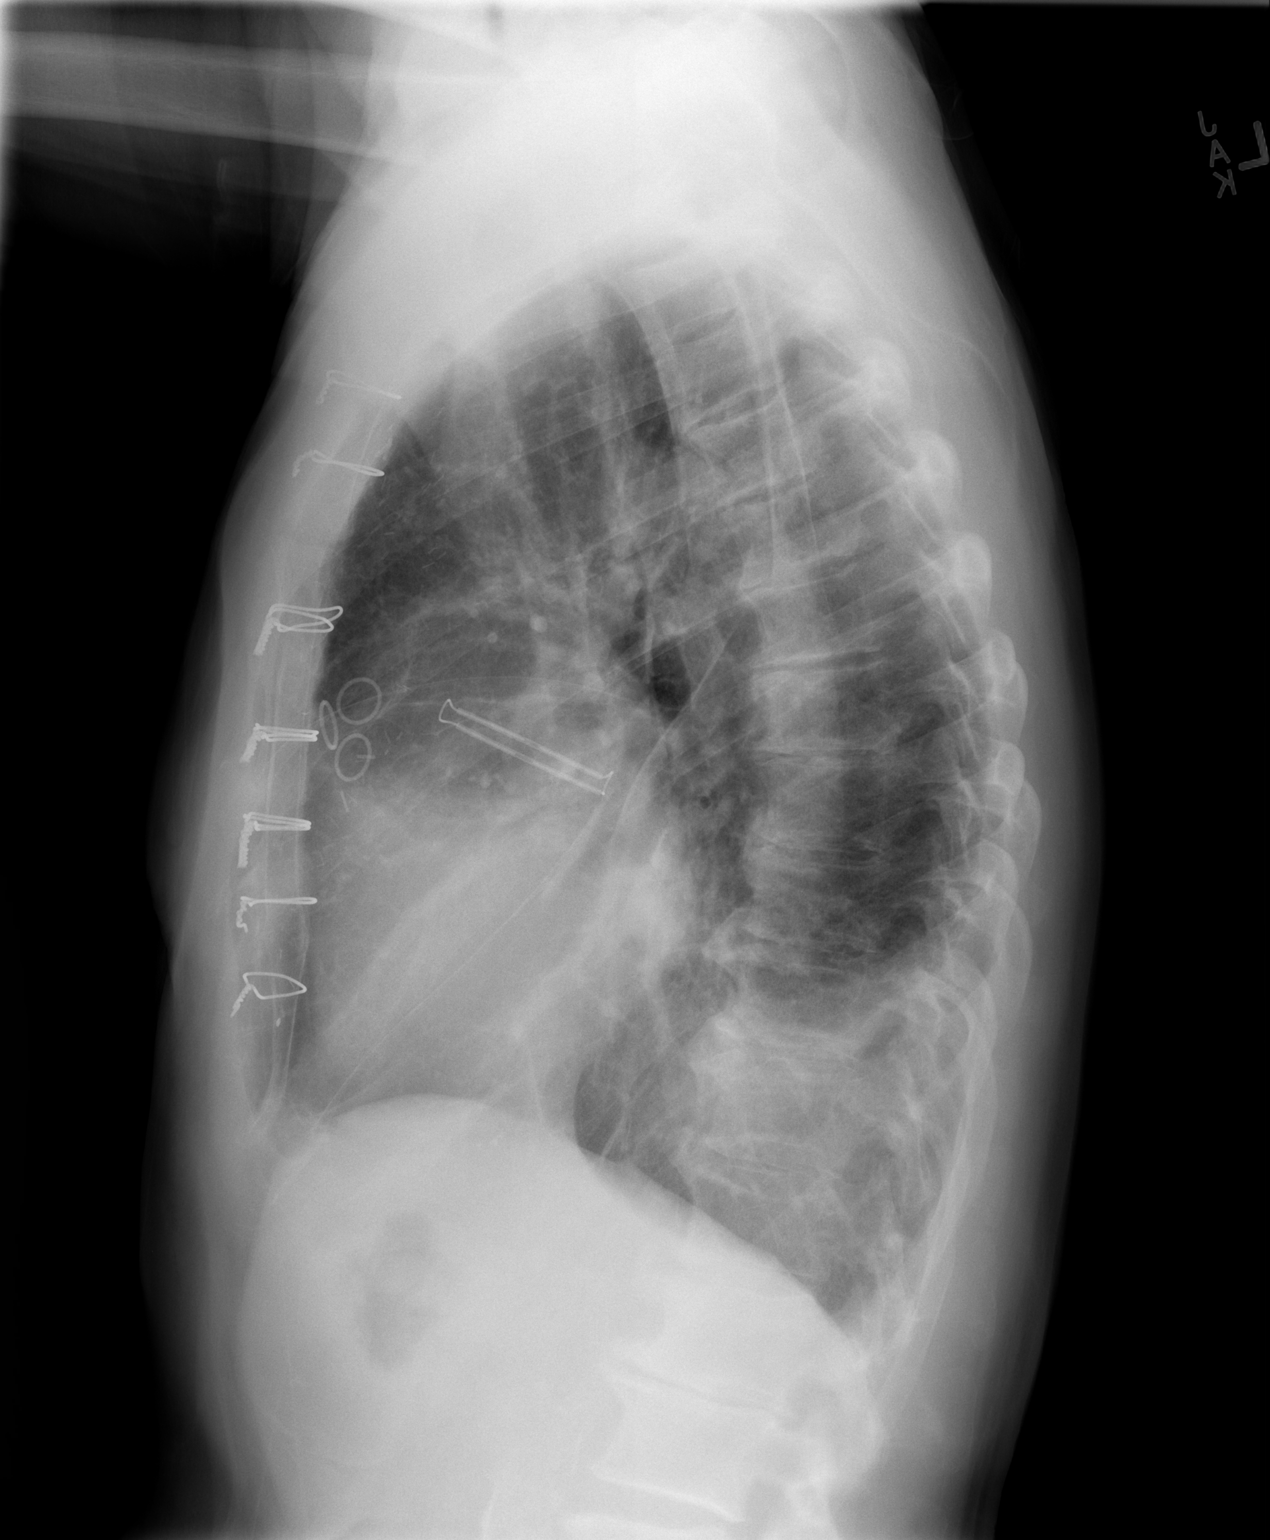

[2 of 2 positions shown; findings below may reference images not displayed]

FINDINGS: Cardiomediastinal silhouette is stable. Again noted status post
CABG. Left atrial appendage clip is unchanged in position. Smaller
left pleural effusion with left basilar atelectasis or infiltrate.
Right lung is clear. No pulmonary edema. Mild degenerative changes
mid and lower thoracic spine.
IMPRESSION: Status post CABG. Smaller residual left pleural effusion with left
basilar atelectasis or infiltrate. Right lung is clear. No pulmonary
edema. Degenerative changes thoracic spine.

## 2017-01-28 ENCOUNTER — Other Ambulatory Visit: Payer: Self-pay

## 2017-01-28 MED ORDER — AMIODARONE HCL 200 MG PO TABS: 200.0000 mg | ORAL_TABLET | Freq: Every day | ORAL | 0 refills | Status: DC

## 2017-01-28 MED ORDER — POTASSIUM CHLORIDE ER 10 MEQ PO TBCR: 10.0000 meq | EXTENDED_RELEASE_TABLET | Freq: Every day | ORAL | 0 refills | Status: DC

## 2017-01-28 MED ORDER — PANTOPRAZOLE SODIUM 40 MG PO TBEC: 40.0000 mg | DELAYED_RELEASE_TABLET | Freq: Every day | ORAL | 0 refills | Status: DC

## 2017-01-28 MED ORDER — ATORVASTATIN CALCIUM 80 MG PO TABS: 80.0000 mg | ORAL_TABLET | Freq: Every day | ORAL | 0 refills | Status: DC

## 2017-01-28 MED ORDER — FUROSEMIDE 40 MG PO TABS: 40.0000 mg | ORAL_TABLET | Freq: Every day | ORAL | 0 refills | Status: DC

## 2017-04-27 ENCOUNTER — Other Ambulatory Visit: Payer: Self-pay | Admitting: Cardiovascular Disease

## 2017-05-02 ENCOUNTER — Other Ambulatory Visit: Payer: Self-pay | Admitting: Cardiovascular Disease

## 2017-05-04 ENCOUNTER — Telehealth: Payer: Self-pay | Admitting: Cardiovascular Disease

## 2017-05-04 NOTE — Telephone Encounter (Signed)
New message     *STAT* If patient is at the pharmacy, call can be transferred to refill team.   1. Which medications need to be refilled? (please list name of each medication and dose if known) pantoprazole 40 mg   2. Which pharmacy/location (including street and city if local pharmacy) is medication to be sent to[facility] [place]  3. Do they need a 30 day or 90 day supply? 30 day

## 2017-05-14 ENCOUNTER — Encounter (HOSPITAL_COMMUNITY): Payer: Commercial Managed Care - HMO

## 2017-05-14 ENCOUNTER — Ambulatory Visit: Payer: Commercial Managed Care - HMO | Admitting: Family

## 2017-05-25 ENCOUNTER — Other Ambulatory Visit: Payer: Self-pay | Admitting: Cardiovascular Disease

## 2017-05-26 ENCOUNTER — Other Ambulatory Visit: Payer: Self-pay | Admitting: Cardiovascular Disease

## 2017-06-01 ENCOUNTER — Telehealth: Payer: Self-pay | Admitting: Cardiovascular Disease

## 2017-06-01 ENCOUNTER — Other Ambulatory Visit: Payer: Self-pay | Admitting: *Deleted

## 2017-06-01 MED ORDER — FUROSEMIDE 40 MG PO TABS: 40.0000 mg | ORAL_TABLET | Freq: Every day | ORAL | 3 refills | Status: DC

## 2017-06-01 MED ORDER — AMIODARONE HCL 200 MG PO TABS: 200.0000 mg | ORAL_TABLET | Freq: Every day | ORAL | 3 refills | Status: DC

## 2017-06-01 MED ORDER — ATORVASTATIN CALCIUM 80 MG PO TABS: 80.0000 mg | ORAL_TABLET | Freq: Every day | ORAL | 3 refills | Status: DC

## 2017-06-01 MED ORDER — POTASSIUM CHLORIDE ER 10 MEQ PO TBCR: 10.0000 meq | EXTENDED_RELEASE_TABLET | Freq: Every day | ORAL | 3 refills | Status: DC

## 2017-06-01 MED ORDER — CARVEDILOL 3.125 MG PO TABS: 3.1250 mg | ORAL_TABLET | Freq: Two times a day (BID) | ORAL | 3 refills | Status: DC

## 2017-06-10 NOTE — Telephone Encounter (Signed)
Pt has appt with Dr Tresa EndoKelly 9/28

## 2017-06-23 ENCOUNTER — Other Ambulatory Visit: Payer: Self-pay | Admitting: Cardiovascular Disease

## 2017-07-22 ENCOUNTER — Other Ambulatory Visit: Payer: Self-pay | Admitting: Cardiovascular Disease

## 2017-09-10 ENCOUNTER — Ambulatory Visit (INDEPENDENT_AMBULATORY_CARE_PROVIDER_SITE_OTHER): Payer: Medicare HMO | Admitting: Cardiovascular Disease

## 2017-09-10 ENCOUNTER — Encounter: Payer: Self-pay | Admitting: Cardiovascular Disease

## 2017-09-10 VITALS — BP 181/107 | HR 64 | Ht 62.0 in | Wt 124.8 lb

## 2017-09-10 DIAGNOSIS — I255 Ischemic cardiomyopathy: Secondary | ICD-10-CM | POA: Diagnosis not present

## 2017-09-10 DIAGNOSIS — I110 Hypertensive heart disease with heart failure: Secondary | ICD-10-CM

## 2017-09-10 DIAGNOSIS — I2583 Coronary atherosclerosis due to lipid rich plaque: Secondary | ICD-10-CM | POA: Diagnosis not present

## 2017-09-10 DIAGNOSIS — Z951 Presence of aortocoronary bypass graft: Secondary | ICD-10-CM | POA: Diagnosis not present

## 2017-09-10 DIAGNOSIS — Z9889 Other specified postprocedural states: Secondary | ICD-10-CM

## 2017-09-10 DIAGNOSIS — I2510 Atherosclerotic heart disease of native coronary artery without angina pectoris: Secondary | ICD-10-CM | POA: Diagnosis not present

## 2017-09-10 DIAGNOSIS — I340 Nonrheumatic mitral (valve) insufficiency: Secondary | ICD-10-CM

## 2017-09-10 DIAGNOSIS — Z79899 Other long term (current) drug therapy: Secondary | ICD-10-CM | POA: Diagnosis not present

## 2017-09-10 DIAGNOSIS — I10 Essential (primary) hypertension: Secondary | ICD-10-CM

## 2017-09-10 DIAGNOSIS — E785 Hyperlipidemia, unspecified: Secondary | ICD-10-CM

## 2017-09-10 LAB — COMPREHENSIVE METABOLIC PANEL
A/G RATIO: 1.3 (ref 1.2–2.2)
ALK PHOS: 93 IU/L (ref 39–117)
ALT: 31 IU/L (ref 0–44)
AST: 25 IU/L (ref 0–40)
Albumin: 4 g/dL (ref 3.5–4.8)
BUN/Creatinine Ratio: 20 (ref 10–24)
BUN: 20 mg/dL (ref 8–27)
Bilirubin Total: 1 mg/dL (ref 0.0–1.2)
CALCIUM: 10.5 mg/dL — AB (ref 8.6–10.2)
CHLORIDE: 101 mmol/L (ref 96–106)
CO2: 27 mmol/L (ref 20–29)
Creatinine, Ser: 1.02 mg/dL (ref 0.76–1.27)
GFR calc Af Amer: 82 mL/min/{1.73_m2} (ref >59)
GFR, EST NON AFRICAN AMERICAN: 71 mL/min/{1.73_m2} (ref >59)
Globulin, Total: 3 g/dL (ref 1.5–4.5)
Glucose: 91 mg/dL (ref 65–99)
POTASSIUM: 4 mmol/L (ref 3.5–5.2)
Sodium: 143 mmol/L (ref 134–144)
Total Protein: 7 g/dL (ref 6.0–8.5)

## 2017-09-10 LAB — TSH: TSH: 2.12 u[IU]/mL (ref 0.450–4.500)

## 2017-09-10 LAB — CBC
Hematocrit: 40.9 % (ref 37.5–51.0)
Hemoglobin: 14.3 g/dL (ref 13.0–17.7)
MCH: 33.7 pg — ABNORMAL HIGH (ref 26.6–33.0)
MCHC: 35 g/dL (ref 31.5–35.7)
MCV: 97 fL (ref 79–97)
Platelets: 192 10*3/uL (ref 150–379)
RBC: 4.24 x10E6/uL (ref 4.14–5.80)
RDW: 14.1 % (ref 12.3–15.4)
WBC: 9.5 10*3/uL (ref 3.4–10.8)

## 2017-09-10 LAB — LIPID PANEL
CHOL/HDL RATIO: 2.2 ratio (ref 0.0–5.0)
CHOLESTEROL TOTAL: 124 mg/dL (ref 100–199)
HDL: 56 mg/dL (ref >39)
LDL Calculated: 56 mg/dL (ref 0–99)
TRIGLYCERIDES: 60 mg/dL (ref 0–149)
VLDL Cholesterol Cal: 12 mg/dL (ref 5–40)

## 2017-09-10 MED ORDER — LOSARTAN POTASSIUM 50 MG PO TABS: 50.0000 mg | ORAL_TABLET | Freq: Every day | ORAL | 3 refills | Status: DC

## 2017-09-10 NOTE — Progress Notes (Signed)
Cardiology Office Note    Date:  09/11/2017   ID:  [name] [name], DOB 17-Mar-1941, MRN [id number]  PCP:  Orpah Melter, MD  Cardiologist:  Shelva Majestic, MD   Initial office evaluation with me.  History of Present Illness:  [name] [name] is a 76 y.o. male  who presents for initial cardiology evaluation with me.  He had undergone CABG revascularization surgery in March 2017.  [name] [name] is originally from [place].  In March 2017.  He presented with accelerated hypertension and increasing shortness of breath.  An echo Doppler study showed an EF of 35%.  He was referred for right and left heart catheterization which was done by me on 03/04/2016.  This revealed severe multivessel CAD with coronary calcification with 50% stenosis in the proximal LAD and diagonal, followed by a napkin ring 75% focal stenosis between the first and second diagonal branch.  There was 50% proximal circumflex stenosis with total occlusion of the OM1 vessel with retrograde collaterals arising from the LAD.  The RCA was totally occluded in its mid segment and there was a 95% stenosis in the acute marginal branch.  There was collaterals to the distal RCA via the distal circumflex and distal LAD.  He did, mild to moderate elevation of right heart pressures.  EF was 35%, consistent with ischemic heart myopathy.  He underwent CABGx4 revascularization surgery by Dr. Tharon Aquas Tright with a LIMA to LAD, SVG to OM, SVG to PDA, and SVG to diagonal.  He also underwent clipping of the left atrial appendage and left carotid endarterectomy.  He had developed atrial fibrillation postoperatively.  In addition, he developed a pleural effusion which required thoracentesis by Dr. Nils Pyle in the outpatient setting.  He saw [name] [name] for initial outpatient follow-up in April 2017.  On 04/16/2016.  He underwent cardioversion with restoration of sinus rhythm.  He has not been seen by cardiology since.  Presently, he states that he is been feeling  well.  He denies any recurrent angina.  He denies significant shortness of breath.  He is unaware of palpitations.  He has been on amiodarone 200 mg daily, carvedilol 3.125 mg twice a day and is unaware of recurrent atrial fibrillation.  He also has been taking Lasix 40 mg daily and is unaware of recurrent edema.  He also has Protonix for GERD.  He continues to take atorvastatin 80 mg for hyperlipidemia.  He presents for his initial evaluation with me in the office.   Past Medical History:  Diagnosis Date  . Atrial fibrillation ([place]) 03/21/2016    Past Surgical History:  Procedure Laterality Date  . CARDIAC CATHETERIZATION N/A 03/04/2016   Procedure: Right/Left Heart Cath and Coronary Angiography;  Surgeon: Troy Sine, MD;  Location: [place] [facility];  Service: Cardiovascular;  Laterality: N/A;  . CARDIOVERSION N/A 04/16/2016   Procedure: CARDIOVERSION;  Surgeon: Jerline Pain, MD;  [facility];  Service: Cardiovascular;  Laterality: N/A;  . CLIPPING OF ATRIAL APPENDAGE Left 03/10/2016   Procedure: CLIPPING OF ATRIAL APPENDAGE;  Surgeon: Ivin Poot, MD;  Location: [place];  Service: Open Heart Surgery;  Laterality: Left;  . CORONARY ARTERY BYPASS GRAFT N/A 03/10/2016   Procedure: CORONARY ARTERY BYPASS GRAFTING (CABG)TIMES 4 USING LEFT INTERNAL MAMMARY AND BILATERAL GREATER SAPHENOUS VEIN HARVESTED BY ENDOVIEN;  Surgeon: Ivin Poot, MD;  Location: [place];  Service: Open Heart Surgery;  Laterality: N/A;  . ENDARTERECTOMY Left 03/10/2016   Procedure: LEFT ENDARTERECTOMY CAROTID DACRON  PATCH ANGIOPLASTY;  Surgeon: Elam Dutch, MD;  Location: [place];  Service: Vascular;  Laterality: Left;  . TEE WITHOUT CARDIOVERSION N/A 03/10/2016   Procedure: TRANSESOPHAGEAL ECHOCARDIOGRAM (TEE);  Surgeon: Ivin Poot, MD;  Location: [place];  Service: Open Heart Surgery;  Laterality: N/A;    Current Medications: Outpatient Medications Prior to Visit  Medication Sig Dispense Refill  .  amiodarone (PACERONE) 200 MG tablet Take 1 tablet (200 mg total) by mouth daily. 30 tablet 3  . Ascorbic Acid (VITAMIN C) 1000 MG tablet Take 1,000 mg by mouth daily.    Marland Kitchen aspirin 81 MG tablet Take 81 mg by mouth every Monday, Wednesday, and Friday.    Marland Kitchen atorvastatin (LIPITOR) 80 MG tablet Take 1 tablet (80 mg total) by mouth daily at 6 PM. Please keep 09/10/17 appt for future refills 30 tablet 1  . carvedilol (COREG) 3.125 MG tablet Take 1 tablet (3.125 mg total) by mouth 2 (two) times daily with a meal. 60 tablet 3  . furosemide (LASIX) 40 MG tablet Take 1 tablet (40 mg total) by mouth daily. 30 tablet 3  . magnesium 30 MG tablet Take 30 mg by mouth daily.    . pantoprazole (PROTONIX) 40 MG tablet Take 1 tablet (40 mg total) by mouth daily. PLEASE MAKE APPOINTMENT FOR FURTHER REFILLS 90 tablet 0  . potassium chloride (KLOR-CON 10) 10 MEQ tablet Take 1 tablet (10 mEq total) by mouth daily. Please keep 09/10/17 appt for further refills 30 tablet 1  . apixaban (ELIQUIS) 5 MG TABS tablet Take 1 tablet by mouth twice daily, needs OV with Dr. Claiborne Billings (Patient not taking: Reported on 09/10/2017) 60 tablet 0   No facility-administered medications prior to visit.      Allergies:   Patient has no known allergies.   Social History   Social History  . Marital status: Married    Spouse name: N/A  . Number of children: N/A  . Years of education: N/A   Social History Main Topics  . Smoking status: Former Research scientist (life sciences)  . Smokeless tobacco: Former Systems developer  . Alcohol use None  . Drug use: Unknown  . Sexual activity: Not Asked   Other Topics Concern  . None   Social History Narrative  . None     Family History:  The patient's family history is not on file.  His parents are deceased.  He came to [place] in the 1960s from [place].  ROS General: Negative; No fevers, chills, or night sweats;  HEENT: Negative; No changes in vision or hearing, sinus congestion, difficulty swallowing Pulmonary: Negative;  No cough, wheezing, shortness of breath, hemoptysis Cardiovascular: See history of present illness GI: Negative; No nausea, vomiting, diarrhea, or abdominal pain GU: Negative; No dysuria, hematuria, or difficulty voiding Musculoskeletal: Negative; no myalgias, joint pain, or weakness Hematologic/Oncology: Negative; no easy bruising, bleeding Endocrine: Negative; no heat/cold intolerance; no diabetes Neuro: Negative; no changes in balance, headaches Skin: Negative; No rashes or skin lesions Psychiatric: Negative; No behavioral problems, depression Sleep: Negative; No snoring, daytime sleepiness, hypersomnolence, bruxism, restless legs, hypnogognic hallucinations, no cataplexy Other comprehensive 14 point system review is negative.   PHYSICAL EXAM:   VS:  BP (!) 181/107 (BP Location: Right Arm)   Pulse 64   Ht '5\' 2"'  (1.575 m)   Wt 124 lb 12.8 oz (56.6 kg)   BMI 22.83 kg/m     Repeat blood pressure by me 148/88  Wt Readings from Last 3 Encounters:  09/10/17 124  lb 12.8 oz (56.6 kg)  11/12/16 135 lb (61.2 kg)  05/08/16 131 lb (59.4 kg)    General: Alert, oriented, no distress.  Skin: normal turgor, no rashes, warm and dry HEENT: Normocephalic, atraumatic. Pupils equal round and reactive to light; sclera anicteric; extraocular muscles intact; Fundi No hemorrhages or exudates Nose without nasal septal hypertrophy Mouth/Parynx benign; Mallinpatti scale 3 Neck: No JVD, no carotid bruits; normal carotid upstroke Lungs: clear to ausculatation and percussion; no wheezing or rales Chest wall: without tenderness to palpitation Heart: PMI not displaced, RRR, s1 s2 normal, 2/6 systolic murmur along the left sternal border, and 3/6 systolic murmur at the apex, no diastolic murmur, no rubs, gallops, thrills, or heaves Abdomen: soft, nontender; no hepatosplenomehaly, BS+; abdominal aorta nontender and not dilated by palpation. Back: no CVA tenderness Pulses 2+ Musculoskeletal: full range of  motion, normal strength, no joint deformities Extremities: no clubbing cyanosis or edema, Homan's sign negative  Neurologic: grossly nonfocal; Cranial nerves grossly wnl Psychologic: Normal mood and affect   Studies/Labs Reviewed:   EKG:  EKG is ordered today.  ECG (independently read by me): Normal sinus rhythm at 64.  First AV block with a PR interval 204 ms.  LVH with repolarization changes.  QTc interval 470 ms.  Recent Labs: BMP Latest Ref Rng & Units 09/10/2017 04/13/2016 03/24/2016  Glucose 65 - 99 mg/dL 91 93 134(H)  BUN 8 - 27 mg/dL 20 19 37(H)  Creatinine 0.76 - 1.27 mg/dL 1.02 1.20(H) 1.91(H)  BUN/Creat Ratio 10 - 24 20 - -  Sodium 134 - 144 mmol/L 143 142 135  Potassium 3.5 - 5.2 mmol/L 4.0 3.7 4.3  Chloride 96 - 106 mmol/L 101 104 98(L)  CO2 20 - 29 mmol/L '27 26 28  ' Calcium 8.6 - 10.2 mg/dL 10.5(H) 9.6 9.2     Hepatic Function Latest Ref Rng & Units 09/10/2017 03/09/2016 03/02/2016  Total Protein 6.0 - 8.5 g/dL 7.0 5.7(L) 6.8  Albumin 3.5 - 4.8 g/dL 4.0 3.1(L) 3.7  AST 0 - 40 IU/L 25 32 30  ALT 0 - 44 IU/L 31 37 16(L)  Alk Phosphatase 39 - 117 IU/L 93 62 69  Total Bilirubin 0.0 - 1.2 mg/dL 1.0 1.1 1.7(H)    CBC Latest Ref Rng & Units 09/10/2017 04/13/2016 03/24/2016  WBC 3.4 - 10.8 x10E3/uL 9.5 9.9 14.2(H)  Hemoglobin 13.0 - 17.7 g/dL 14.3 12.7(L) 10.4(L)  Hematocrit 37.5 - 51.0 % 40.9 38.1(L) 32.8(L)  Platelets 150 - 379 x10E3/uL 192 247 381   Lab Results  Component Value Date   MCV 97 09/10/2017   MCV 93.4 04/13/2016   MCV 95.6 03/24/2016   Lab Results  Component Value Date   TSH 2.120 09/10/2017   Lab Results  Component Value Date   HGBA1C 5.4 03/04/2016     BNP    Component Value Date/Time   BNP 513.7 (H) 03/02/2016 1421    ProBNP No results found for: PROBNP   Lipid Panel     Component Value Date/Time   CHOL 124 09/10/2017 1202   TRIG 60 09/10/2017 1202   HDL 56 09/10/2017 1202   CHOLHDL 2.2 09/10/2017 1202   CHOLHDL 4.5 03/04/2016 0400     VLDL 13 03/04/2016 0400   LDLCALC 56 09/10/2017 1202     RADIOLOGY: No results found.   Additional studies/ records that were reviewed today include:  I reviewed his catheterization report,Echo Doppler data, hospital records, office visits with Dr. Nils Pyle and Tarri Fuller.  03/04/2016.  Cardiac catheterization Conclusion    RPDA lesion, 95% stenosed.  Prox RCA lesion, 80% stenosed.  Dist RCA lesion, 100% stenosed.  LM lesion, 50% stenosed.  Ost 1st Diag to 1st Diag lesion, 50% stenosed.  Prox LAD lesion, 75% stenosed.  1st Mrg lesion, 100% stenosed.  There is moderate left ventricular systolic dysfunction.   Severe multivessel CAD with coronary calcification and 50% stenosis in the proximal LAD and diagonal vessel followed by a napkin ring 75% focal stenosis between the first and second diagonal branch; 50% proximal circumflex stenosis with total occlusion of the OM1 vessel with retrograde collaterals arising from the LAD; and total occlusion of the mid RCA with 95% stenosis in the acute margin branch, and evidence for collaterals to the distal RCA via the distal circumflex and distal LAD.  Mild to moderate elevation of right heart pressures.  Moderately severe LV dysfunction with more pronounced inferior hypocontractility and EF of 35%  consistent with an ischemic cardiomyopathy.  RECOMMENDATION: Surgical consultation for consideration of CABG revascularization surgery.      ------------------------------------------------------------------- 03/03/2016 ECHO Study Conclusions  - Left ventricle: The cavity size was normal. There was mild   concentric hypertrophy. Systolic function was moderately reduced.   The estimated ejection fraction was in the range of 35% to 40%.   Diffuse hypokinesis with akinesis of the basal and mid   inferolateral walls. Features are consistent with a pseudonormal   left ventricular filling pattern, with concomitant abnormal    relaxation and increased filling pressure (grade 2 diastolic   dysfunction). Doppler parameters are consistent with elevated   ventricular end-diastolic filling pressure. - Aortic valve: Structurally normal valve. There was mild   regurgitation. - Aorta: The aorta was normal, not dilated, and non-diseased. - Mitral valve: Structurally normal valve. There was moderate   regurgitation. - Left atrium: The atrium was mildly dilated. - Right ventricle: The cavity size was normal. Wall thickness was   normal. Systolic function was normal. - Right atrium: The atrium was normal in size. - Tricuspid valve: There was mild regurgitation. - Pulmonary arteries: Systolic pressure was mildly increased. PA   peak pressure: 38 mm Hg (S). - Pericardium, extracardiac: The pericardium was normal in   appearance. There was a left pleural effusion.   ASSESSMENT:    1. Coronary artery disease due to lipid rich plaque   2. Essential hypertension   3. Ischemic cardiomyopathy   4. Hypertensive heart disease with heart failure ([place])   5. Mitral valve insufficiency, unspecified etiology   6. S/P CABG x 4   7. History of left-sided carotid endarterectomy   8. Medication management   9. Hyperlipidemia with target LDL less than 70      PLAN:  Mr. Dumond is a 76 year old gentleman originally from [place].  He was found to have an ischemic cardiomyopathy with accelerated hypertension and multivessel CAD in March 2017.  He required CBG revascularization surgery 4.  He developed postoperative atrial fibrillation.  He also developed a subsequent left pleural effusion which ultimately required thoracentesis.  He underwent cardioversion for atrial fibrillation in May 2017.  He has not been seen in cardiology follow-up since.  Presently, today he is without anginal symptoms.  His blood pressure is elevated.  He has been on Lasix 40 mg in addition to low-dose carvedilol at 3.125 mg as well as amiodarone and is  maintaining sinus rhythm.  I am adding losartan 50 mg daily to his medical regimen.  I me he has physical exam  suggestive of significant mitral regurgitation murmur.  I'm scheduling him for a 2-D echo Doppler study.  He is fasting today and a complete set of fasting laboratory will be obtained.  I will make adjustments to his medications if possible.  If his LV continues to be reduced.  He may benefit from transition to [place] therapy.  I will see him in 6 weeks for reevaluation.    Medication Adjustments/Labs and Tests Ordered: Current medicines are reviewed at length with the patient today.  Concerns regarding medicines are outlined above.  Medication changes, Labs and Tests ordered today are listed in the Patient Instructions below. Patient Instructions  Medication Instructions:  START Losartan 50 mg daily-prescription sent to pharmacy  Labwork: FASTING labs today (CMET, CBC, TSH, Lipid)  Testing/Procedures: Your physician has requested that you have an echocardiogram. Echocardiography is a painless test that uses sound waves to create images of your heart. It provides your doctor with information about the size and shape of your heart and how well your heart's chambers and valves are working. This procedure takes approximately one hour. There are no restrictions for this procedure.  This will be done at our [facility] location:  [place]: Your physician recommends that you schedule a follow-up appointment in: [place] with Dr. Claiborne Billings.   Any Other Special Instructions Will Be Listed Below (If Applicable).     If you need a refill on your cardiac medications before your next appointment, please call your pharmacy.     Time spent: 40 minutes Signed, Shelva Majestic, MD  09/11/2017 11:57 AM    [facility] [address], [place], [place], [place]  [address] Phone: [phone]

## 2017-09-10 NOTE — Patient Instructions (Addendum)
Medication Instructions:  START Losartan 50 mg daily-prescription sent to pharmacy  Labwork: FASTING labs today (CMET, CBC, TSH, Lipid)  Testing/Procedures: Your physician has requested that you have an echocardiogram. Echocardiography is a painless test that uses sound waves to create images of your heart. It provides your doctor with information about the size and shape of your heart and how well your heart's chambers and valves are working. This procedure takes approximately one hour. There are no restrictions for this procedure.  This will be done at our [facility] location:  [employer]  Follow-Up: Your physician recommends that you schedule a follow-up appointment in: 6 WEEKS with Dr. Tresa Endo.   Any Other Special Instructions Will Be Listed Below (If Applicable).     If you need a refill on your cardiac medications before your next appointment, please call your pharmacy.

## 2017-09-17 ENCOUNTER — Other Ambulatory Visit: Payer: Self-pay

## 2017-09-17 ENCOUNTER — Encounter (HOSPITAL_COMMUNITY): Payer: Self-pay

## 2017-09-17 ENCOUNTER — Ambulatory Visit (HOSPITAL_COMMUNITY): Payer: Medicare HMO | Attending: Cardiovascular Disease

## 2017-09-17 DIAGNOSIS — I509 Heart failure, unspecified: Secondary | ICD-10-CM | POA: Diagnosis not present

## 2017-09-17 DIAGNOSIS — Z87891 Personal history of nicotine dependence: Secondary | ICD-10-CM | POA: Insufficient documentation

## 2017-09-17 DIAGNOSIS — I10 Essential (primary) hypertension: Secondary | ICD-10-CM | POA: Diagnosis not present

## 2017-09-17 DIAGNOSIS — I071 Rheumatic tricuspid insufficiency: Secondary | ICD-10-CM | POA: Diagnosis not present

## 2017-09-17 DIAGNOSIS — Z951 Presence of aortocoronary bypass graft: Secondary | ICD-10-CM | POA: Insufficient documentation

## 2017-09-17 DIAGNOSIS — I2510 Atherosclerotic heart disease of native coronary artery without angina pectoris: Secondary | ICD-10-CM | POA: Diagnosis not present

## 2017-09-17 DIAGNOSIS — I340 Nonrheumatic mitral (valve) insufficiency: Secondary | ICD-10-CM | POA: Diagnosis not present

## 2017-09-17 DIAGNOSIS — I110 Hypertensive heart disease with heart failure: Secondary | ICD-10-CM | POA: Diagnosis not present

## 2017-09-17 DIAGNOSIS — I4891 Unspecified atrial fibrillation: Secondary | ICD-10-CM | POA: Diagnosis not present

## 2017-09-17 DIAGNOSIS — I2583 Coronary atherosclerosis due to lipid rich plaque: Secondary | ICD-10-CM | POA: Diagnosis not present

## 2017-09-17 NOTE — Progress Notes (Signed)
[name] [name] presented for echo this morning. PA pressure was significantly elevated compared to echo performed 03/20/16. DOD (Dr. Okey Dupre) was notified and advised that if he was asymptomatic, he could go home and let Dr. Tresa Endo know. Patient had no symptoms and was stable to be discharged home.  [name] [name], [place] 09/17/2017

## 2017-09-21 NOTE — Progress Notes (Signed)
We'll see and discuss with patient upon review and in follow-up office visit

## 2017-09-24 ENCOUNTER — Other Ambulatory Visit: Payer: Self-pay | Admitting: Cardiovascular Disease

## 2017-09-24 NOTE — Telephone Encounter (Signed)
REFILL 

## 2017-10-01 ENCOUNTER — Telehealth: Payer: Self-pay | Admitting: Cardiovascular Disease

## 2017-10-01 NOTE — Telephone Encounter (Signed)
Patient made aware of results and verbalized understanding.     Patient has appt 12/7 with Dr. Tresa EndoKelly.    Will verify that this is not too far out based on recommendations.   Patient aware if Dr. Tresa EndoKelly recommends being seen sooner someone will call to reschedule.

## 2017-10-01 NOTE — Telephone Encounter (Signed)
Follow up     [facility]

## 2017-10-07 NOTE — Telephone Encounter (Signed)
Per Dr. Tresa EndoKelly ok to keep appt 12/7

## 2017-10-07 NOTE — Telephone Encounter (Signed)
No additional notes

## 2017-10-14 ENCOUNTER — Other Ambulatory Visit: Payer: Self-pay | Admitting: *Deleted

## 2017-10-14 ENCOUNTER — Telehealth: Payer: Self-pay | Admitting: Cardiovascular Disease

## 2017-10-14 MED ORDER — CARVEDILOL 3.125 MG PO TABS: 3.1250 mg | ORAL_TABLET | Freq: Two times a day (BID) | ORAL | 2 refills | Status: DC

## 2017-10-14 NOTE — Telephone Encounter (Signed)
New Message   *STAT* If patient is at the pharmacy, call can be transferred to refill team.   1. Which medications need to be refilled? (please list name of each medication and dose if known) Carvedilol 3.125  2. Which pharmacy/location (including street and city if local pharmacy) is medication to be sent to? [facility] [place]  3. Do they need a 30 day or 90 day supply? 90

## 2017-10-21 ENCOUNTER — Other Ambulatory Visit: Payer: Self-pay | Admitting: *Deleted

## 2017-10-21 NOTE — Patient Outreach (Signed)
[employer] HRA attempted, pt was not home and I did leave a message asking for a return phone call. I will call him back if I do not hear from him.  Zara Councilarroll C. Burgess EstelleSpinks, MSN, [facility] Center[facility]NP-BC Gerontological Nurse Practitioner [facility] Care Management [phone]

## 2017-10-22 ENCOUNTER — Other Ambulatory Visit: Payer: Self-pay | Admitting: *Deleted

## 2017-10-22 NOTE — Patient Outreach (Signed)
[facility] and offer AWV. I was able to leave a message and requesed a return call. I attempt to call [name] another day.  Zara Councilarroll C. Burgess EstelleSpinks, MSN, [facility] Hospital[facility]NP-BC Gerontological Nurse Practitioner [facility] Care Management [phone]

## 2017-10-25 ENCOUNTER — Telehealth: Payer: Self-pay | Admitting: Cardiovascular Disease

## 2017-10-25 NOTE — Telephone Encounter (Signed)
Patient made aware of results and verbalized his understanding.   Notes recorded by Lennette BihariKelly, Thomas A, MD on 10/24/2017 at 6:43 PM EST CBC normal; calcium minimally increased at 10.5; lipid status stable; TSH normal

## 2017-10-25 NOTE — Telephone Encounter (Signed)
Patient returning your call for lab results °

## 2017-10-26 ENCOUNTER — Encounter: Payer: Self-pay | Admitting: *Deleted

## 2017-10-26 ENCOUNTER — Other Ambulatory Visit: Payer: Self-pay | Admitting: *Deleted

## 2017-10-26 NOTE — Patient Outreach (Signed)
Third attempt to complete a [employer] health screen. [name] [name] did answer the phone today and told me he sees his cardiologist and that is all he needs. He asked that I not call back and hung up.  I will outreach, Dr. Lennette Biharihomas A Kelly, to see if he could influence [name] [name] to see his primary care MD for an ANNUAL WELLNESS Raynelle JanEXAM.  [name] Pitstick C. Burgess EstelleSpinks, MSN, [employer] Inc[employer]NP-BC Gerontological Nurse Practitioner [facility] Care Management [phone]

## 2017-10-29 ENCOUNTER — Other Ambulatory Visit: Payer: Self-pay | Admitting: Cardiovascular Disease

## 2017-10-29 NOTE — Telephone Encounter (Signed)
Rx(s) sent to pharmacy electronically.  

## 2017-11-09 ENCOUNTER — Other Ambulatory Visit: Payer: Self-pay | Admitting: Cardiovascular Disease

## 2017-11-09 NOTE — Telephone Encounter (Signed)
REFILL 

## 2017-11-19 ENCOUNTER — Encounter: Payer: Self-pay | Admitting: Cardiovascular Disease

## 2017-11-19 ENCOUNTER — Ambulatory Visit: Payer: Medicare HMO | Admitting: Cardiovascular Disease

## 2017-11-19 VITALS — BP 152/95 | HR 60 | Ht 62.0 in | Wt 128.8 lb

## 2017-11-19 DIAGNOSIS — I2583 Coronary atherosclerosis due to lipid rich plaque: Secondary | ICD-10-CM | POA: Diagnosis not present

## 2017-11-19 DIAGNOSIS — I340 Nonrheumatic mitral (valve) insufficiency: Secondary | ICD-10-CM | POA: Diagnosis not present

## 2017-11-19 DIAGNOSIS — I2510 Atherosclerotic heart disease of native coronary artery without angina pectoris: Secondary | ICD-10-CM

## 2017-11-19 DIAGNOSIS — I5042 Chronic combined systolic (congestive) and diastolic (congestive) heart failure: Secondary | ICD-10-CM

## 2017-11-19 DIAGNOSIS — Z79899 Other long term (current) drug therapy: Secondary | ICD-10-CM

## 2017-11-19 DIAGNOSIS — Z951 Presence of aortocoronary bypass graft: Secondary | ICD-10-CM

## 2017-11-19 DIAGNOSIS — I480 Paroxysmal atrial fibrillation: Secondary | ICD-10-CM

## 2017-11-19 DIAGNOSIS — I110 Hypertensive heart disease with heart failure: Secondary | ICD-10-CM

## 2017-11-19 DIAGNOSIS — I255 Ischemic cardiomyopathy: Secondary | ICD-10-CM

## 2017-11-19 MED ORDER — CARVEDILOL 3.125 MG PO TABS: ORAL_TABLET | ORAL | 3 refills | Status: DC

## 2017-11-19 MED ORDER — CLOPIDOGREL BISULFATE 75 MG PO TABS: 75.0000 mg | ORAL_TABLET | Freq: Every day | ORAL | 3 refills | Status: DC

## 2017-11-19 MED ORDER — SACUBITRIL-VALSARTAN 24-26 MG PO TABS: 1.0000 | ORAL_TABLET | Freq: Two times a day (BID) | ORAL | 0 refills | Status: DC

## 2017-11-19 NOTE — Progress Notes (Signed)
Cardiology Office Note    Date:  11/21/2017   ID:  [name] [name], DOB [birth year], MRN [id number]  PCP:  Orpah Melter, MD  Cardiologist:  Shelva Majestic, MD   Initial office evaluation with me.  History of Present Illness:  [name] [name] is a 76 y.o. male  who presents for a 3 month follow-up cardiology evaluation .  [name] is originally from [place].  In March 2017 he presented with accelerated hypertension and increasing shortness of breath.  An echo Doppler study showed an EF of 35%.  He was referred for right and left heart catheterization which was done by me on 03/04/2016.  This revealed severe multivessel CAD with coronary calcification with 50% stenosis in the proximal LAD and diagonal, followed by a napkin ring 75% focal stenosis between the first and second diagonal branch.  There was 50% proximal circumflex stenosis with total occlusion of the OM1 vessel with retrograde collaterals arising from the LAD.  The RCA was totally occluded in its mid segment and there was a 95% stenosis in the acute marginal branch.  There was collaterals to the distal RCA via the distal circumflex and distal LAD.  He did, mild to moderate elevation of right heart pressures.  EF was 35%, consistent with ischemic heart myopathy.  He underwent CABGx4 revascularization surgery by Dr. Tharon Aquas Tright with a LIMA to LAD, SVG to OM, SVG to PDA, and SVG to diagonal.  He also underwent clipping of the left atrial appendage and left carotid endarterectomy.  He had developed atrial fibrillation postoperatively.  In addition, he developed a pleural effusion which required thoracentesis by Dr. Nils Pyle in the outpatient setting.  He saw [name] for initial outpatient follow-up in April 2017.  On 04/16/2016.  He underwent cardioversion with restoration of sinus rhythm.  He has not been seen by cardiology since.  I had not seen him since his original hospitalization until he presented to my office in September 2018.   At that time, he denied any recurrent anginal symptoms or significant dyspnea or palpitations.  He was on amiodarone 200 mg daily, carvedilol 3.125 mg twice a day and was unaware of recurrent atrial fibrillation.  He also has been taking Lasix 40 mg daily, Protonix for GERD and atorvastatin 80 mg for hyperlipidemia.    When I initially saw him, his physical examination suggested a significant murmur of mitral regurgitation.  I scheduled him for 2-D echo Doppler study, and also discussed potential future ARNI therapy.  An echo Doppler study on 09/17/2017 showed further depression of LV function with an EF of 25-30% with diffuse hypocontractility and akinesis of the inferolateral wall.  He had grade 3 diastolic dysfunction.  There was moderate mitral regurgitation, severe LA dilation, moderate RA dilation, and moderate aortic insufficiency.  PA pressure was significant increased at 85 mm.  Laboratory and show normal thyroid function.  He was not anemic.  He had stable renal function and LFTs.  Lipid studies revealed a total cholesterol 124, triglycerides 60, HDL 56, and LDL 56.  He presents for follow-up evaluation.   Past Medical History:  Diagnosis Date  . Atrial fibrillation ([place]) 03/21/2016    Past Surgical History:  Procedure Laterality Date  . CARDIAC CATHETERIZATION N/A 03/04/2016   Procedure: Right/Left Heart Cath and Coronary Angiography;  Surgeon: Troy Sine, MD;  Location: [facility];  Service: Cardiovascular;  Laterality: N/A;  . CARDIOVERSION N/A 04/16/2016   Procedure: CARDIOVERSION;  Surgeon: Jerline Pain,  MD;  Location: [facility];  Service: Cardiovascular;  Laterality: N/A;  . CLIPPING OF ATRIAL APPENDAGE Left 03/10/2016   Procedure: CLIPPING OF ATRIAL APPENDAGE;  Surgeon: Ivin Poot, MD;  Location: [facility];  Service: Open Heart Surgery;  Laterality: Left;  . CORONARY ARTERY BYPASS GRAFT N/A 03/10/2016   Procedure: CORONARY ARTERY BYPASS GRAFTING (CABG)TIMES 4 USING LEFT  INTERNAL MAMMARY AND BILATERAL GREATER SAPHENOUS VEIN HARVESTED BY ENDOVIEN;  Surgeon: Ivin Poot, MD;  Location: [facility];  Service: Open Heart Surgery;  Laterality: N/A;  . ENDARTERECTOMY Left 03/10/2016   Procedure: LEFT ENDARTERECTOMY CAROTID DACRON PATCH ANGIOPLASTY;  Surgeon: Elam Dutch, MD;  Location: [facility];  Service: Vascular;  Laterality: Left;  . TEE WITHOUT CARDIOVERSION N/A 03/10/2016   Procedure: TRANSESOPHAGEAL ECHOCARDIOGRAM (TEE);  Surgeon: Ivin Poot, MD;  Location: [facility];  Service: Open Heart Surgery;  Laterality: N/A;    Current Medications: Outpatient Medications Prior to Visit  Medication Sig Dispense Refill  . amiodarone (PACERONE) 200 MG tablet TAKE 1 TABLET BY MOUTH EVERY DAY 30 tablet 3  . Ascorbic Acid (VITAMIN C) 1000 MG tablet Take 1,000 mg by mouth daily.    Marland Kitchen aspirin 81 MG tablet Take 81 mg by mouth every Monday, Wednesday, and Friday.    Marland Kitchen atorvastatin (LIPITOR) 80 MG tablet TAKE 1 TABLET BY MOUTH DAILY AT 6PM 30 tablet 5  . furosemide (LASIX) 40 MG tablet TAKE 1 TABLET BY MOUTH EVERY DAY 30 tablet 10  . KLOR-CON 10 10 MEQ tablet TAKE 1 TABLET BY MOUTH DAILY 30 tablet 5  . magnesium 30 MG tablet Take 30 mg by mouth daily.    . pantoprazole (PROTONIX) 40 MG tablet Take 1 tablet (40 mg total) by mouth daily. PLEASE MAKE APPOINTMENT FOR FURTHER REFILLS 90 tablet 0  . carvedilol (COREG) 3.125 MG tablet Take 1 tablet (3.125 mg total) by mouth 2 (two) times daily with a meal. 180 tablet 2  . losartan (COZAAR) 50 MG tablet Take 1 tablet (50 mg total) by mouth daily. 90 tablet 3   No facility-administered medications prior to visit.      Allergies:   Patient has no known allergies.   Social History   Socioeconomic History  . Marital status: Married    Spouse name: None  . Number of children: None  . Years of education: None  . Highest education level: None  Social Needs  . Financial resource strain: None  . Food insecurity - worry: None  . Food  insecurity - inability: None  . Transportation needs - medical: None  . Transportation needs - non-medical: None  Occupational History  . None  Tobacco Use  . Smoking status: Former Research scientist (life sciences)  . Smokeless tobacco: Former Network engineer and Sexual Activity  . Alcohol use: None  . Drug use: None  . Sexual activity: None  Other Topics Concern  . None  Social History Narrative  . None     Family History:  The patient's family history is not on file.  His parents are deceased.  He came to [place] in the 1960s from [place].  ROS General: Negative; No fevers, chills, or night sweats;  HEENT: Negative; No changes in vision or hearing, sinus congestion, difficulty swallowing Pulmonary: Negative; No cough, wheezing, shortness of breath, hemoptysis Cardiovascular: See history of present illness GI: Negative; No nausea, vomiting, diarrhea, or abdominal pain GU: Negative; No dysuria, hematuria, or difficulty voiding Musculoskeletal: Negative; no myalgias, joint pain, or weakness Hematologic/Oncology:  Negative; no easy bruising, bleeding Endocrine: Negative; no heat/cold intolerance; no diabetes Neuro: Negative; no changes in balance, headaches Skin: Negative; No rashes or skin lesions Psychiatric: Negative; No behavioral problems, depression Sleep: Negative; No snoring, daytime sleepiness, hypersomnolence, bruxism, restless legs, hypnogognic hallucinations, no cataplexy Other comprehensive 14 point system review is negative.   PHYSICAL EXAM:   VS:  BP (!) 152/95   Pulse 60   Ht '5\' 2"'  (1.575 m)   Wt 128 lb 12.8 oz (58.4 kg)   BMI 23.56 kg/m     Repeat blood pressure by me was 122/82  Wt Readings from Last 3 Encounters:  11/19/17 128 lb 12.8 oz (58.4 kg)  09/10/17 124 lb 12.8 oz (56.6 kg)  11/12/16 135 lb (61.2 kg)      Physical Exam BP (!) 152/95   Pulse 60   Ht '5\' 2"'  (1.575 m)   Wt 128 lb 12.8 oz (58.4 kg)   BMI 23.56 kg/m  General: Alert, oriented, no distress.    Skin: normal turgor, no rashes, warm and dry HEENT: Normocephalic, atraumatic. Pupils equal round and reactive to light; sclera anicteric; extraocular muscles intact;  Nose without nasal septal hypertrophy Mouth/Parynx benign; Mallinpatti scale 3 Neck: No JVD, no carotid bruits; normal carotid upstroke Lungs: clear to ausculatation and percussion; no wheezing or rales Chest wall: without tenderness to palpitation Heart: PMI not displaced, RRR, s1 s2 normal, 2/6 systolic murmur along the left sternal border and 2-3/6 at the apex, no diastolic murmur, no rubs, gallops, thrills, or heaves Abdomen: soft, nontender; no hepatosplenomehaly, BS+; abdominal aorta nontender and not dilated by palpation. Back: no CVA tenderness Pulses 2+ Musculoskeletal: full range of motion, normal strength, no joint deformities Extremities: Trace to 1+ ankle edema; no clubbing ,cyanosis, Homan's sign negative  Neurologic: grossly nonfocal; Cranial nerves grossly wnl Psychologic: Normal mood and affect   Studies/Labs Reviewed:   EKG:  EKG is ordered today.  ECG (independently read by me):  Probable atrial fibrillation with ventricular rate in the 60s.  LVH with repolarization changes.  QTc interval 464 ms.  September 2018 ECG (independently read by me): Normal sinus rhythm at 64.  First AV block with a PR interval 204 ms.  LVH with repolarization changes.  QTc interval 470 ms.  Recent Labs: BMP Latest Ref Rng & Units 09/10/2017 04/13/2016 03/24/2016  Glucose 65 - 99 mg/dL 91 93 134(H)  BUN 8 - 27 mg/dL 20 19 37(H)  Creatinine 0.76 - 1.27 mg/dL 1.02 1.20(H) 1.91(H)  BUN/Creat Ratio 10 - 24 20 - -  Sodium 134 - 144 mmol/L 143 142 135  Potassium 3.5 - 5.2 mmol/L 4.0 3.7 4.3  Chloride 96 - 106 mmol/L 101 104 98(L)  CO2 20 - 29 mmol/L '27 26 28  ' Calcium 8.6 - 10.2 mg/dL 10.5(H) 9.6 9.2     Hepatic Function Latest Ref Rng & Units 09/10/2017 03/09/2016 03/02/2016  Total Protein 6.0 - 8.5 g/dL 7.0 5.7(L) 6.8  Albumin  3.5 - 4.8 g/dL 4.0 3.1(L) 3.7  AST 0 - 40 IU/L 25 32 30  ALT 0 - 44 IU/L 31 37 16(L)  Alk Phosphatase 39 - 117 IU/L 93 62 69  Total Bilirubin 0.0 - 1.2 mg/dL 1.0 1.1 1.7(H)    CBC Latest Ref Rng & Units 09/10/2017 04/13/2016 03/24/2016  WBC 3.4 - 10.8 x10E3/uL 9.5 9.9 14.2(H)  Hemoglobin 13.0 - 17.7 g/dL 14.3 12.7(L) 10.4(L)  Hematocrit 37.5 - 51.0 % 40.9 38.1(L) 32.8(L)  Platelets 150 - 379 x10E3/uL 192  247 381   Lab Results  Component Value Date   MCV 97 09/10/2017   MCV 93.4 04/13/2016   MCV 95.6 03/24/2016   Lab Results  Component Value Date   TSH 2.120 09/10/2017   Lab Results  Component Value Date   HGBA1C 5.4 03/04/2016     BNP    Component Value Date/Time   BNP 513.7 (H) 03/02/2016 1421    ProBNP No results found for: PROBNP   Lipid Panel     Component Value Date/Time   CHOL 124 09/10/2017 1202   TRIG 60 09/10/2017 1202   HDL 56 09/10/2017 1202   CHOLHDL 2.2 09/10/2017 1202   CHOLHDL 4.5 03/04/2016 0400   VLDL 13 03/04/2016 0400   LDLCALC 56 09/10/2017 1202     RADIOLOGY: No results found.   Additional studies/ records that were reviewed today include:  I reviewed his catheterization report,Echo Doppler data, hospital records, office visits with Dr. Nils Pyle and Tarri Fuller.  03/04/2016.  Cardiac catheterization Conclusion    RPDA lesion, 95% stenosed.  Prox RCA lesion, 80% stenosed.  Dist RCA lesion, 100% stenosed.  LM lesion, 50% stenosed.  Ost 1st Diag to 1st Diag lesion, 50% stenosed.  Prox LAD lesion, 75% stenosed.  1st Mrg lesion, 100% stenosed.  There is moderate left ventricular systolic dysfunction.   Severe multivessel CAD with coronary calcification and 50% stenosis in the proximal LAD and diagonal vessel followed by a napkin ring 75% focal stenosis between the first and second diagonal branch; 50% proximal circumflex stenosis with total occlusion of the OM1 vessel with retrograde collaterals arising from the LAD; and  total occlusion of the mid RCA with 95% stenosis in the acute margin branch, and evidence for collaterals to the distal RCA via the distal circumflex and distal LAD.  Mild to moderate elevation of right heart pressures.  Moderately severe LV dysfunction with more pronounced inferior hypocontractility and EF of 35%  consistent with an ischemic cardiomyopathy.  RECOMMENDATION: Surgical consultation for consideration of CABG revascularization surgery.      ------------------------------------------------------------------- 03/03/2016 ECHO Study Conclusions  - Left ventricle: The cavity size was normal. There was mild   concentric hypertrophy. Systolic function was moderately reduced.   The estimated ejection fraction was in the range of 35% to 40%.   Diffuse hypokinesis with akinesis of the basal and mid   inferolateral walls. Features are consistent with a pseudonormal   left ventricular filling pattern, with concomitant abnormal   relaxation and increased filling pressure (grade 2 diastolic   dysfunction). Doppler parameters are consistent with elevated   ventricular end-diastolic filling pressure. - Aortic valve: Structurally normal valve. There was mild   regurgitation. - Aorta: The aorta was normal, not dilated, and non-diseased. - Mitral valve: Structurally normal valve. There was moderate   regurgitation. - Left atrium: The atrium was mildly dilated. - Right ventricle: The cavity size was normal. Wall thickness was   normal. Systolic function was normal. - Right atrium: The atrium was normal in size. - Tricuspid valve: There was mild regurgitation. - Pulmonary arteries: Systolic pressure was mildly increased. PA   peak pressure: 38 mm Hg (S). - Pericardium, extracardiac: The pericardium was normal in   appearance. There was a left pleural effusion.   ASSESSMENT:    1. Coronary artery disease due to lipid rich plaque   2. S/P CABG x 4   3. Paroxysmal atrial  fibrillation (HCC)   4. Medication management   5. Hypertensive heart disease  with heart failure ([place])   6. Ischemic cardiomyopathy   7. Mitral valve insufficiency, unspecified etiology   8. Chronic combined systolic and diastolic heart failure (HCC)     PLAN:  Mr. Buresh is a 76 year old gentleman who is originally from [place].  He was found to have an ischemic cardiomyopathy with accelerated hypertension and multivessel CAD in March 2017.  He required CBG revascularization surgery 4.  He developed postoperative atrial fibrillation.  He also developed a subsequent left pleural effusion which ultimately required thoracentesis.  He underwent cardioversion for atrial fibrillation in May 2017.  Apparently, he had been on eliquis at that time, but developed  s and has not been on anticoagulation.  I had not seen him since his March 2017 evaluation until his initial office visit with me 2 months ago.  At that time, I initiated losartan to his medical regimen.  He was on Lasix in addition to low-dose carvedilol and amiodarone and was maintaining sinus rhythm.  His ECG today suggest the possibility of atrial fibrillation with difficult to discern P waves.  Continues to be on amiodarone 2 mg daily, carvedilol 3.125 Mill grams twice a day, furosemide 40 mg, and losartan 50 mg.  Reviewed his echo Doppler data which shows reduced LV function with an EF of 20 [birth year]1929 percent.  I discussed reinstitution with anticoagulation with either eliquis, Xarelto, or warfarin.  He is adamant against reinstituting anticoagulation.  For this reason, I will add Plavix 75 mg daily for him to take in addition to his aspirin 81 mg.  I have recommended transition to AR and I therapy.  Will discontinue valsartan and I will start Entresto 24/26 twice a day for 4 weeks.  At that time.  He will see her form D and as long as blood pressure.  Renal function are adequate further titration to 49/50 one twice a day will be done at that time.   I discussed his ischemic heart myopathy and the potential need for future ICD implantation if his LV function does not improve.  I am further titrating carvedilol to 1-1/2 of the 3.125 mg twice a day regimen and dependent upon blood pressure and heart rate response further titration to 6.25 twice a day will be done.  In 3 weeks repeat laboratory will be obtained including a proBNP.  I will see him in 4 weeks for reevaluation.     Medication Adjustments/Labs and Tests Ordered: Current medicines are reviewed at length with the patient today.  Concerns regarding medicines are outlined above.  Medication changes, Labs and Tests ordered today are listed in the Patient Instructions below. Patient Instructions  Medication Instructions:  STOP losartan  START Entresto 24/26 mg two times daily  START Plavix 75 mg daily  INCREASE carvedilol (Coreg) to 1.5 tablets two times daily  Labwork: Please return for labs in 3 weeks (CMET, CBC, ProBNP)  Follow-Up: Your physician recommends that you schedule a follow-up appointment in: 4 weeks with pharmacist and 8 weeks with Dr. Claiborne Billings.    Any Other Special Instructions Will Be Listed Below (If Applicable).     If you need a refill on your cardiac medications before your next appointment, please call your pharmacy.     Time spent: 40 minutes Signed, Shelva Majestic, MD  11/21/2017 12:18 PM    [facility] [address], [place], [place], [place]  [address] Phone: [phone]

## 2017-11-19 NOTE — Patient Instructions (Signed)
Medication Instructions:  STOP losartan  START Entresto 24/26 mg two times daily  START Plavix 75 mg daily  INCREASE carvedilol (Coreg) to 1.5 tablets two times daily  Labwork: Please return for labs in 3 weeks (CMET, CBC, ProBNP)  Follow-Up: Your physician recommends that you schedule a follow-up appointment in: 4 weeks with pharmacist and 8 weeks with Dr. Tresa EndoKelly.    Any Other Special Instructions Will Be Listed Below (If Applicable).     If you need a refill on your cardiac medications before your next appointment, please call your pharmacy.

## 2017-11-21 ENCOUNTER — Encounter: Payer: Self-pay | Admitting: Cardiovascular Disease

## 2017-12-15 NOTE — Progress Notes (Signed)
Patient ID: [name] [name]                 DOB: 1941/08/16                      MRN: [id number]     HPI: [name] [name] is a 77 y.o. male referred by Dr. Tresa [name] to HTN clinic. PMH includes atrial fibrillation, hypertension, ischemic cardiomyopathy, mitral valve insufficiency, and heart failure with EF of 35%. Entresto 24/26 twice daily was started on 11/19/2016 by DR [name] [name]. His carvedilol was also increased to 3.125mg  (1.5 tabs) twice dialy. Patient presets today for BP monitoring further medication titration.  Denies dizziness, increased fatigue, shortness of breath or fatigue. Report inability to afford Entresto due to elevated co-pay but still using samples as provided.  Current HTN meds:  Carvedilol 3.125mg  (1.5 tablets) twice daily furosemide 40mg  daily Entresto 24-26mg  twice daily  BP goal: <130/80  Family History: unknown history of cardia complications  Social History: former smoker, denies alcohol use, denies smokeless tobacco  Diet: low sodium for many years  Home BP readings: none available to assess today. "Okay at home" per patient history  Wt Readings from Last 3 Encounters:  12/16/17 129 lb 6.4 oz (58.7 kg)  11/19/17 128 lb 12.8 oz (58.4 kg)  09/10/17 124 lb 12.8 oz (56.6 kg)   BP Readings from Last 3 Encounters:  12/16/17 (!) 146/64  11/19/17 (!) 152/95  09/10/17 (!) 181/107   Pulse Readings from Last 3 Encounters:  12/16/17 66  11/19/17 60  09/10/17 64    Past Medical History:  Diagnosis Date  . Atrial fibrillation (HCC) 03/21/2016    Current Outpatient Medications on File Prior to Visit  Medication Sig Dispense Refill  . amiodarone (PACERONE) 200 MG tablet TAKE 1 TABLET BY MOUTH EVERY DAY 30 tablet 3  . Ascorbic Acid (VITAMIN C) 1000 MG tablet Take 1,000 mg by mouth daily.    Marland Kitchen aspirin 81 MG tablet Take 81 mg by mouth every Monday, Wednesday, and Friday.    Marland Kitchen atorvastatin (LIPITOR) 80 MG tablet TAKE 1 TABLET BY MOUTH DAILY AT 6PM 30 tablet 5  . clopidogrel  (PLAVIX) 75 MG tablet Take 1 tablet (75 mg total) by mouth daily. 90 tablet 3  . furosemide (LASIX) 40 MG tablet TAKE 1 TABLET BY MOUTH EVERY DAY 30 tablet 10  . KLOR-CON 10 10 MEQ tablet TAKE 1 TABLET BY MOUTH DAILY 30 tablet 5  . magnesium 30 MG tablet Take 30 mg by mouth daily.    . pantoprazole (PROTONIX) 40 MG tablet Take 1 tablet (40 mg total) by mouth daily. PLEASE MAKE APPOINTMENT FOR FURTHER REFILLS 90 tablet 0  . sacubitril-valsartan (ENTRESTO) 24-26 MG Take 1 tablet by mouth 2 (two) times daily. 28 tablet 0   No current facility-administered medications on file prior to visit.     No Known Allergies  Blood pressure (!) 146/64, pulse 66, weight 129 lb 6.4 oz (58.7 kg), SpO2 96 %.  Acute combined systolic and diastolic heart failure (HCC) Patient is tolerating Entresto and carvedilol very well. Noted blood pressure remains elevated but patient did not complete orders for BMET check and I am unable to increase Entresto at this time. Patient also express affortability issues with Entresto. Will increase carvedilol to 6.25mg  twice daily, repeat BMET, and start paperwork for [facility] patient assistance program. IF renal function remains stable and BP at goal during OV in 2 weeks; will increase Entresto to 49/51mg  twice daily  but will attempt to complete patient assistance process first to improve complieance.   [name] [name] PharmD, BCPS, CPP [facility] [address] [place] [phone] 12/19/2017 7:01 PM

## 2017-12-16 ENCOUNTER — Ambulatory Visit (INDEPENDENT_AMBULATORY_CARE_PROVIDER_SITE_OTHER): Payer: Medicare HMO | Admitting: Pharmacist

## 2017-12-16 VITALS — BP 146/64 | HR 66 | Wt 129.4 lb

## 2017-12-16 DIAGNOSIS — Z79899 Other long term (current) drug therapy: Secondary | ICD-10-CM | POA: Diagnosis not present

## 2017-12-16 DIAGNOSIS — Z951 Presence of aortocoronary bypass graft: Secondary | ICD-10-CM | POA: Diagnosis not present

## 2017-12-16 DIAGNOSIS — I2510 Atherosclerotic heart disease of native coronary artery without angina pectoris: Secondary | ICD-10-CM | POA: Diagnosis not present

## 2017-12-16 DIAGNOSIS — I5041 Acute combined systolic (congestive) and diastolic (congestive) heart failure: Secondary | ICD-10-CM

## 2017-12-16 DIAGNOSIS — I10 Essential (primary) hypertension: Secondary | ICD-10-CM | POA: Diagnosis not present

## 2017-12-16 DIAGNOSIS — I2583 Coronary atherosclerosis due to lipid rich plaque: Secondary | ICD-10-CM | POA: Diagnosis not present

## 2017-12-16 DIAGNOSIS — I480 Paroxysmal atrial fibrillation: Secondary | ICD-10-CM | POA: Diagnosis not present

## 2017-12-16 DIAGNOSIS — I110 Hypertensive heart disease with heart failure: Secondary | ICD-10-CM | POA: Diagnosis not present

## 2017-12-16 MED ORDER — CARVEDILOL 6.25 MG PO TABS: 6.2500 mg | ORAL_TABLET | Freq: Two times a day (BID) | ORAL | 1 refills | Status: DC

## 2017-12-16 NOTE — Patient Instructions (Signed)
Return for a a follow up appointment in 4 weeks with Dr Tresa EndoKelly  Your blood pressure today is 148/64 pulse 66  Check your blood pressure at home daily (if able) and keep record of the readings.  Take your BP meds as follows: *INCREASE carvedilol to 6.25mg  twice daily* *Conitnue all other medication as prescribed*  *Complete patient assistance paperwork and bring back to clinic*  Bring all of your meds, your BP cuff and your record of home blood pressures to your next appointment.  Exercise as you're able, try to walk approximately 30 minutes per day.  Keep salt intake to a minimum, especially watch canned and prepared boxed foods.  Eat more fresh fruits and vegetables and fewer canned items.  Avoid eating in fast food restaurants.    HOW TO TAKE YOUR BLOOD PRESSURE: . Rest 5 minutes before taking your blood pressure. .  Don't smoke or drink caffeinated beverages for at least 30 minutes before. . Take your blood pressure before (not after) you eat. . Sit comfortably with your back supported and both feet on the floor (don't cross your legs). . Elevate your arm to heart level on a table or a desk. . Use the proper sized cuff. It should fit smoothly and snugly around your bare upper arm. There should be enough room to slip a fingertip under the cuff. The bottom edge of the cuff should be 1 inch above the crease of the elbow. . Ideally, take 3 measurements at one sitting and record the average.

## 2017-12-17 LAB — COMPREHENSIVE METABOLIC PANEL
ALBUMIN: 3.4 g/dL — AB (ref 3.5–4.8)
ALK PHOS: 92 IU/L (ref 39–117)
ALT: 27 IU/L (ref 0–44)
AST: 23 IU/L (ref 0–40)
Albumin/Globulin Ratio: 0.9 — ABNORMAL LOW (ref 1.2–2.2)
BILIRUBIN TOTAL: 0.7 mg/dL (ref 0.0–1.2)
BUN / CREAT RATIO: 17 (ref 10–24)
BUN: 22 mg/dL (ref 8–27)
CHLORIDE: 103 mmol/L (ref 96–106)
CO2: 28 mmol/L (ref 20–29)
Calcium: 10.5 mg/dL — ABNORMAL HIGH (ref 8.6–10.2)
Creatinine, Ser: 1.26 mg/dL (ref 0.76–1.27)
GFR calc Af Amer: 63 mL/min/{1.73_m2} (ref >59)
GFR calc non Af Amer: 55 mL/min/{1.73_m2} — ABNORMAL LOW (ref >59)
GLOBULIN, TOTAL: 3.6 g/dL (ref 1.5–4.5)
GLUCOSE: 86 mg/dL (ref 65–99)
Potassium: 4.6 mmol/L (ref 3.5–5.2)
SODIUM: 145 mmol/L — AB (ref 134–144)
Total Protein: 7 g/dL (ref 6.0–8.5)

## 2017-12-17 LAB — CBC
Hematocrit: 41.2 % (ref 37.5–51.0)
Hemoglobin: 14.1 g/dL (ref 13.0–17.7)
MCH: 33.3 pg — AB (ref 26.6–33.0)
MCHC: 34.2 g/dL (ref 31.5–35.7)
MCV: 97 fL (ref 79–97)
PLATELETS: 223 10*3/uL (ref 150–379)
RBC: 4.24 x10E6/uL (ref 4.14–5.80)
RDW: 14.6 % (ref 12.3–15.4)
WBC: 10.4 10*3/uL (ref 3.4–10.8)

## 2017-12-17 LAB — PRO B NATRIURETIC PEPTIDE: NT-Pro BNP: 3754 pg/mL — ABNORMAL HIGH (ref 0–486)

## 2017-12-19 ENCOUNTER — Encounter: Payer: Self-pay | Admitting: Pharmacist

## 2017-12-19 NOTE — Assessment & Plan Note (Signed)
Patient is tolerating Entresto and carvedilol very well. Noted blood pressure remains elevated but patient did not complete orders for BMET check and I am unable to increase Entresto at this time. Patient also express affortability issues with Entresto. Will increase carvedilol to 6.25mg  twice daily, repeat BMET, and [facility]. IF renal function remains stable and BP at goal during OV in 2 weeks; will increase Entresto to 49/51mg  twice daily but will attempt to complete patient assistance process first to improve complieance.

## 2017-12-20 DIAGNOSIS — E7800 Pure hypercholesterolemia, unspecified: Secondary | ICD-10-CM | POA: Diagnosis not present

## 2017-12-20 DIAGNOSIS — I2510 Atherosclerotic heart disease of native coronary artery without angina pectoris: Secondary | ICD-10-CM | POA: Diagnosis not present

## 2017-12-20 DIAGNOSIS — K219 Gastro-esophageal reflux disease without esophagitis: Secondary | ICD-10-CM | POA: Diagnosis not present

## 2017-12-20 DIAGNOSIS — I5042 Chronic combined systolic (congestive) and diastolic (congestive) heart failure: Secondary | ICD-10-CM | POA: Diagnosis not present

## 2017-12-20 DIAGNOSIS — I480 Paroxysmal atrial fibrillation: Secondary | ICD-10-CM | POA: Diagnosis not present

## 2017-12-20 DIAGNOSIS — Z0000 Encounter for general adult medical examination without abnormal findings: Secondary | ICD-10-CM | POA: Diagnosis not present

## 2017-12-21 ENCOUNTER — Telehealth: Payer: Self-pay | Admitting: *Deleted

## 2017-12-21 NOTE — Telephone Encounter (Signed)
Entresto patient assistance form filled out and faxed to [phone]

## 2018-01-26 ENCOUNTER — Encounter: Payer: Self-pay | Admitting: Cardiovascular Disease

## 2018-01-26 ENCOUNTER — Ambulatory Visit: Payer: Medicare HMO | Admitting: Cardiovascular Disease

## 2018-01-26 VITALS — BP 146/72 | HR 69 | Ht 62.0 in | Wt 136.4 lb

## 2018-01-26 DIAGNOSIS — I481 Persistent atrial fibrillation: Secondary | ICD-10-CM | POA: Diagnosis not present

## 2018-01-26 DIAGNOSIS — I4819 Other persistent atrial fibrillation: Secondary | ICD-10-CM

## 2018-01-26 DIAGNOSIS — Z951 Presence of aortocoronary bypass graft: Secondary | ICD-10-CM | POA: Diagnosis not present

## 2018-01-26 DIAGNOSIS — I2583 Coronary atherosclerosis due to lipid rich plaque: Secondary | ICD-10-CM

## 2018-01-26 DIAGNOSIS — I255 Ischemic cardiomyopathy: Secondary | ICD-10-CM | POA: Diagnosis not present

## 2018-01-26 DIAGNOSIS — I2510 Atherosclerotic heart disease of native coronary artery without angina pectoris: Secondary | ICD-10-CM | POA: Diagnosis not present

## 2018-01-26 DIAGNOSIS — Z79899 Other long term (current) drug therapy: Secondary | ICD-10-CM

## 2018-01-26 DIAGNOSIS — I5042 Chronic combined systolic (congestive) and diastolic (congestive) heart failure: Secondary | ICD-10-CM | POA: Diagnosis not present

## 2018-01-26 DIAGNOSIS — I10 Essential (primary) hypertension: Secondary | ICD-10-CM | POA: Diagnosis not present

## 2018-01-26 DIAGNOSIS — E785 Hyperlipidemia, unspecified: Secondary | ICD-10-CM | POA: Diagnosis not present

## 2018-01-26 MED ORDER — SACUBITRIL-VALSARTAN 49-51 MG PO TABS: 1.0000 | ORAL_TABLET | Freq: Two times a day (BID) | ORAL | 3 refills | Status: DC

## 2018-01-26 NOTE — Patient Instructions (Signed)
Medication Instructions:  INCREASE Entresto to 49/51 mg two times daily  Labwork: Please return for labs in 4 weeks (BMET,ProBNP)-lab orders provided  Follow-Up: 6 weeks with Dr. Tresa EndoKelly  Any Other Special Instructions Will Be Listed Below (If Applicable).     If you need a refill on your cardiac medications before your next appointment, please call your pharmacy.

## 2018-01-26 NOTE — Progress Notes (Signed)
Cardiology Office Note    Date:  01/28/2018   ID:  [name] [name], DOB 1941-09-04, MRN [id number]  PCP:  Orpah Melter, MD  Cardiologist:  Shelva Majestic, MD   F/U office evaluation.  Initial office evaluation 11/19/2017  History of Present Illness:  [name] [name] is a 77 y.o. male  who presents for a 2 month follow-up cardiology evaluation .  [name] [name] is originally from [place].  In March 2017 he presented with accelerated hypertension and increasing shortness of breath.  An echo Doppler study showed an EF of 35%.  He was referred for right and left heart catheterization which was done by me on 03/04/2016.  This revealed severe multivessel CAD with coronary calcification with 50% stenosis in the proximal LAD and diagonal, followed by a napkin ring 75% focal stenosis between the first and second diagonal branch.  There was 50% proximal circumflex stenosis with total occlusion of the OM1 vessel with retrograde collaterals arising from the LAD.  The RCA was totally occluded in its mid segment and there was a 95% stenosis in the acute marginal branch.  There was collaterals to the distal RCA via the distal circumflex and distal LAD.  He did, mild to moderate elevation of right heart pressures.  EF was 35%, consistent with ischemic heart myopathy.  He underwent CABGx4 revascularization surgery by Dr. Tharon Aquas Tright with a LIMA to LAD, SVG to OM, SVG to PDA, and SVG to diagonal.  He also underwent clipping of the left atrial appendage and left carotid endarterectomy.  He had developed atrial fibrillation postoperatively.  In addition, he developed a pleural effusion which required thoracentesis by Dr. Nils Pyle in the outpatient setting.  He saw [name] for initial outpatient follow-up in April 2017.  On 04/16/2016.  He underwent cardioversion with restoration of sinus rhythm.  He has not been seen by cardiology since.  I had not seen him since his original hospitalization until he presented to my  office in September 2018.  At that time, he denied any recurrent anginal symptoms or significant dyspnea or palpitations.  He was on amiodarone 200 mg daily, carvedilol 3.125 mg twice a day and was unaware of recurrent atrial fibrillation.  He also has been taking Lasix 40 mg daily, Protonix for GERD and atorvastatin 80 mg for hyperlipidemia.    When I initially saw him, his physical examination suggested a significant murmur of mitral regurgitation.  I scheduled him for 2-D echo Doppler study, and also discussed potential future ARNI therapy.  An echo Doppler study on 09/17/2017 showed further depression of LV function with an EF of 25-30% with diffuse hypocontractility and akinesis of the inferolateral wall.  He had grade 3 diastolic dysfunction.  There was moderate mitral regurgitation, severe LA dilation, moderate RA dilation, and moderate aortic insufficiency.  PA pressure was significant increased at 85 mm.  Laboratory and show normal thyroid function.  He was not anemic.  He had stable renal function and LFTs.  Lipid studies revealed a total cholesterol 124, triglycerides 60, HDL 56, and LDL 56.   When I saw him on 11/19/2017, I discontinued valsartan and started him on Entresto 24/26 twice a day.  I also discussed reinstitution with anticoagulation which he was adamant that he would not do.  As result, I added Plavix 75 mg daily in addition to his aspirin 81 mg.  We also discussed potential need for future ICD implantation.  Over the past 2 months, his breathing has improved.  He  still notes some mild ankle swelling bilaterally.  He denies palpitations.  Follow-up laboratory in 12/16/2017 showed a hemoglobin of 14.1, hematocrit 41.2.  ProBNP was elevated at 3754.  Creatinine was stable at 1.26.  He was seen by Raquel deformity in our office on 12/16/2017.  Carvedilol was increased to 6.25 mg twice a day.  She is working on the [employer] patient assistance program.  He presents for reevaluation.  Past  Medical History:  Diagnosis Date  . Atrial fibrillation ([place]) 03/21/2016    Past Surgical History:  Procedure Laterality Date  . CARDIAC CATHETERIZATION N/A 03/04/2016   Procedure: Right/Left Heart Cath and Coronary Angiography;  Surgeon: Troy Sine, MD;  Location: [place] CV LAB;  Service: Cardiovascular;  Laterality: N/A;  . CARDIOVERSION N/A 04/16/2016   Procedure: CARDIOVERSION;  Surgeon: Jerline Pain, MD;  Location: [facility] ENDOSCOPY;  Service: Cardiovascular;  Laterality: N/A;  . CLIPPING OF ATRIAL APPENDAGE Left 03/10/2016   Procedure: CLIPPING OF ATRIAL APPENDAGE;  Surgeon: Ivin Poot, MD;  Location: [place];  Service: Open Heart Surgery;  Laterality: Left;  . CORONARY ARTERY BYPASS GRAFT N/A 03/10/2016   Procedure: CORONARY ARTERY BYPASS GRAFTING (CABG)TIMES 4 USING LEFT INTERNAL MAMMARY AND BILATERAL GREATER SAPHENOUS VEIN HARVESTED BY ENDOVIEN;  Surgeon: Ivin Poot, MD;  Location: [place];  Service: Open Heart Surgery;  Laterality: N/A;  . ENDARTERECTOMY Left 03/10/2016   Procedure: LEFT ENDARTERECTOMY CAROTID DACRON PATCH ANGIOPLASTY;  Surgeon: Elam Dutch, MD;  Location: [place];  Service: Vascular;  Laterality: Left;  . TEE WITHOUT CARDIOVERSION N/A 03/10/2016   Procedure: TRANSESOPHAGEAL ECHOCARDIOGRAM (TEE);  Surgeon: Ivin Poot, MD;  Location: [place];  Service: Open Heart Surgery;  Laterality: N/A;    Current Medications: Outpatient Medications Prior to Visit  Medication Sig Dispense Refill  . amiodarone (PACERONE) 200 MG tablet TAKE 1 TABLET BY MOUTH EVERY DAY 30 tablet 3  . Ascorbic Acid (VITAMIN C) 1000 MG tablet Take 1,000 mg by mouth daily.    Marland Kitchen aspirin 81 MG tablet Take 81 mg by mouth every Monday, Wednesday, and Friday.    Marland Kitchen atorvastatin (LIPITOR) 80 MG tablet TAKE 1 TABLET BY MOUTH DAILY AT 6PM 30 tablet 5  . carvedilol (COREG) 6.25 MG tablet Take 1 tablet (6.25 mg total) by mouth 2 (two) times daily with a meal. 60 tablet 1  . clopidogrel (PLAVIX) 75 MG  tablet Take 1 tablet (75 mg total) by mouth daily. 90 tablet 3  . furosemide (LASIX) 40 MG tablet TAKE 1 TABLET BY MOUTH EVERY DAY 30 tablet 10  . KLOR-CON 10 10 MEQ tablet TAKE 1 TABLET BY MOUTH DAILY 30 tablet 5  . magnesium 30 MG tablet Take 30 mg by mouth daily.    . pantoprazole (PROTONIX) 40 MG tablet Take 1 tablet (40 mg total) by mouth daily. PLEASE MAKE APPOINTMENT FOR FURTHER REFILLS 90 tablet 0  . sacubitril-valsartan (ENTRESTO) 24-26 MG Take 1 tablet by mouth 2 (two) times daily. 28 tablet 0   No facility-administered medications prior to visit.      Allergies:   Patient has no known allergies.   Social History   Socioeconomic History  . Marital status: Married    Spouse name: None  . Number of children: None  . Years of education: None  . Highest education level: None  Social Needs  . Financial resource strain: None  . Food insecurity - worry: None  . Food insecurity - inability: None  . Transportation needs -  medical: None  . Transportation needs - non-medical: None  Occupational History  . None  Tobacco Use  . Smoking status: Former Research scientist (life sciences)  . Smokeless tobacco: Former Network engineer and Sexual Activity  . Alcohol use: None  . Drug use: None  . Sexual activity: None  Other Topics Concern  . None  Social History Narrative  . None     Family History:  The patient's family history is not on file.  His parents are deceased.  He came to [place] in the 1960s from [place].  ROS General: Negative; No fevers, chills, or night sweats;  HEENT: Negative; No changes in vision or hearing, sinus congestion, difficulty swallowing Pulmonary: Negative; No cough, wheezing, shortness of breath, hemoptysis Cardiovascular: See history of present illness GI: Negative; No nausea, vomiting, diarrhea, or abdominal pain GU: Negative; No dysuria, hematuria, or difficulty voiding Musculoskeletal: Negative; no myalgias, joint pain, or weakness Hematologic/Oncology: Negative;  no easy bruising, bleeding Endocrine: Negative; no heat/cold intolerance; no diabetes Neuro: Negative; no changes in balance, headaches Skin: Negative; No rashes or skin lesions Psychiatric: Negative; No behavioral problems, depression Sleep: Negative; No snoring, daytime sleepiness, hypersomnolence, bruxism, restless legs, hypnogognic hallucinations, no cataplexy Other comprehensive 14 point system review is negative.   PHYSICAL EXAM:   VS:  BP (!) 146/72   Pulse 69   Ht '5\' 2"'  (1.575 m)   Wt 136 lb 6.4 oz (61.9 kg)   BMI 24.95 kg/m     Repeat blood pressure by me 130/74  Wt Readings from Last 3 Encounters:  01/26/18 136 lb 6.4 oz (61.9 kg)  12/16/17 129 lb 6.4 oz (58.7 kg)  11/19/17 128 lb 12.8 oz (58.4 kg)    General: Alert, oriented, no distress.  Skin: normal turgor, no rashes, warm and dry HEENT: Normocephalic, atraumatic. Pupils equal round and reactive to light; sclera anicteric; extraocular muscles intact;  Nose without nasal septal hypertrophy Mouth/Parynx benign; Mallinpatti scale 3 Neck: No JVD, no carotid bruits; normal carotid upstroke Lungs: clear to ausculatation and percussion; no wheezing or rales Chest wall: without tenderness to palpitation Heart: PMI not displaced, RRR, s1 s2 normal, 1/6 systolic murmur, no diastolic murmur, no rubs, gallops, thrills, or heaves Abdomen: soft, nontender; no hepatosplenomehaly, BS+; abdominal aorta nontender and not dilated by palpation. Back: no CVA tenderness Pulses 2+ Musculoskeletal: full range of motion, normal strength, no joint deformities Extremities: 1.-2+ ankle edema ; no clubbing cyanosis  Homan's sign negative  Neurologic: grossly nonfocal; Cranial nerves grossly wnl Psychologic: Normal mood and affect   Studies/Labs Reviewed:   EKG:  EKG is ordered today.  ECG (independently read by me): Atrial fibrillation at 69 bpm.  Incomplete left bundle blanch block.  ST-T changes.  December 2018 ECG (independently  read by me):  Probable atrial fibrillation with ventricular rate in the 60s.  LVH with repolarization changes.  QTc interval 464 ms.  September 2018 ECG (independently read by me): Normal sinus rhythm at 64.  First AV block with a PR interval 204 ms.  LVH with repolarization changes.  QTc interval 470 ms.  Recent Labs: BMP Latest Ref Rng & Units 12/16/2017 09/10/2017 04/13/2016  Glucose 65 - 99 mg/dL 86 91 93  BUN 8 - 27 mg/dL '22 20 19  ' Creatinine 0.76 - 1.27 mg/dL 1.26 1.02 1.20(H)  BUN/Creat Ratio 10 - '24 17 20 ' -  Sodium 134 - 144 mmol/L 145(H) 143 142  Potassium 3.5 - 5.2 mmol/L 4.6 4.0 3.7  Chloride 96 - 106 mmol/L  103 101 104  CO2 20 - 29 mmol/L '28 27 26  ' Calcium 8.6 - 10.2 mg/dL 10.5(H) 10.5(H) 9.6     Hepatic Function Latest Ref Rng & Units 12/16/2017 09/10/2017 03/09/2016  Total Protein 6.0 - 8.5 g/dL 7.0 7.0 5.7(L)  Albumin 3.5 - 4.8 g/dL 3.4(L) 4.0 3.1(L)  AST 0 - 40 IU/L 23 25 32  ALT 0 - 44 IU/L 27 31 37  Alk Phosphatase 39 - 117 IU/L 92 93 62  Total Bilirubin 0.0 - 1.2 mg/dL 0.7 1.0 1.1    CBC Latest Ref Rng & Units 12/16/2017 09/10/2017 04/13/2016  WBC 3.4 - 10.8 x10E3/uL 10.4 9.5 9.9  Hemoglobin 13.0 - 17.7 g/dL 14.1 14.3 12.7(L)  Hematocrit 37.5 - 51.0 % 41.2 40.9 38.1(L)  Platelets 150 - 379 x10E3/uL 223 192 247   Lab Results  Component Value Date   MCV 97 12/16/2017   MCV 97 09/10/2017   MCV 93.4 04/13/2016   Lab Results  Component Value Date   TSH 2.120 09/10/2017   Lab Results  Component Value Date   HGBA1C 5.4 03/04/2016     BNP    Component Value Date/Time   BNP 513.7 (H) 03/02/2016 1421    ProBNP    Component Value Date/Time   PROBNP 3,754 (H) 12/16/2017 1056     Lipid Panel     Component Value Date/Time   CHOL 124 09/10/2017 1202   TRIG 60 09/10/2017 1202   HDL 56 09/10/2017 1202   CHOLHDL 2.2 09/10/2017 1202   CHOLHDL 4.5 03/04/2016 0400   VLDL 13 03/04/2016 0400   LDLCALC 56 09/10/2017 1202     RADIOLOGY: No results  found.   Additional studies/ records that were reviewed today include:  I reviewed his catheterization report,Echo Doppler data, hospital records, office visits with Dr. Nils Pyle and Tarri Fuller.  03/04/2016.  Cardiac catheterization Conclusion    RPDA lesion, 95% stenosed.  Prox RCA lesion, 80% stenosed.  Dist RCA lesion, 100% stenosed.  LM lesion, 50% stenosed.  Ost 1st Diag to 1st Diag lesion, 50% stenosed.  Prox LAD lesion, 75% stenosed.  1st Mrg lesion, 100% stenosed.  There is moderate left ventricular systolic dysfunction.   Severe multivessel CAD with coronary calcification and 50% stenosis in the proximal LAD and diagonal vessel followed by a napkin ring 75% focal stenosis between the first and second diagonal branch; 50% proximal circumflex stenosis with total occlusion of the OM1 vessel with retrograde collaterals arising from the LAD; and total occlusion of the mid RCA with 95% stenosis in the acute margin branch, and evidence for collaterals to the distal RCA via the distal circumflex and distal LAD.  Mild to moderate elevation of right heart pressures.  Moderately severe LV dysfunction with more pronounced inferior hypocontractility and EF of 35%  consistent with an ischemic cardiomyopathy.  RECOMMENDATION: Surgical consultation for consideration of CABG revascularization surgery.      ------------------------------------------------------------------- 03/03/2016 ECHO Study Conclusions  - Left ventricle: The cavity size was normal. There was mild   concentric hypertrophy. Systolic function was moderately reduced.   The estimated ejection fraction was in the range of 35% to 40%.   Diffuse hypokinesis with akinesis of the basal and mid   inferolateral walls. Features are consistent with a pseudonormal   left ventricular filling pattern, with concomitant abnormal   relaxation and increased filling pressure (grade 2 diastolic   dysfunction). Doppler  parameters are consistent with elevated   ventricular end-diastolic filling pressure. - Aortic  valve: Structurally normal valve. There was mild   regurgitation. - Aorta: The aorta was normal, not dilated, and non-diseased. - Mitral valve: Structurally normal valve. There was moderate   regurgitation. - Left atrium: The atrium was mildly dilated. - Right ventricle: The cavity size was normal. Wall thickness was   normal. Systolic function was normal. - Right atrium: The atrium was normal in size. - Tricuspid valve: There was mild regurgitation. - Pulmonary arteries: Systolic pressure was mildly increased. PA   peak pressure: 38 mm Hg (S). - Pericardium, extracardiac: The pericardium was normal in   appearance. There was a left pleural effusion.   ASSESSMENT:    1. Ischemic cardiomyopathy   2. Coronary artery disease due to lipid rich plaque   3. S/P CABG x 4   4. Chronic combined systolic and diastolic heart failure ([place])   5. Medication management   6. Persistent atrial fibrillation ([place])   7. Essential hypertension   8. Hyperlipidemia LDL goal <70     PLAN:  [name] [name] is a 77 year old gentleman who is originally from [place].  He was found to have an ischemic cardiomyopathy with accelerated hypertension and multivessel CAD in March 2017.  He underwent  CABG revascularization surgery 4.  He developed postoperative atrial fibrillation and also developed a subsequent left pleural effusion which ultimately required thoracentesis.  He underwent cardioversion for atrial fibrillation in May 2017.  Apparently, he had been on eliquis at that time, but developed significant nose bleeding s and has not been on anticoagulation.  When I saw him for by initial evaluation in December following his hospitalization.  Discussed resumption of anticoagulation.  He was adamant that he would not start this.  As result, he has been on aspirin and Plavix.  He today confirms that he is in atrial  fibrillation.  He is aware that there is increased risk for thromboembolism if not on anticoagulation and he excepts this risk.  I his most recent echo Doppler data demonstrates reduced LV function with an EF of 20 [birth year]1929 percent.  When I saw him 2 months ago, I initiated Entresto in place of ARB therapy.  His blood pressure today is stable.  I will further titrate this to 49/51 mg twice a day with ultimate 18 to maximize therapy at 97/103 twice a day if blood pressure tolerates.  He continues to have some lower extremity edema.  He may benefit from possible future addition of spironolactone.  I will not start this.  Presently, to make certain his blood pressure tolerates the increased Entresto dose.  Repeat blood work including a basic metabolic panel and pro-BMP will be obtained in 4 weeks to see if there's been significant reduction in his proBNP level.  He continues to be on atorvastatin 80 mg daily for hyperlipidemia with target LDL less than 70.  I will see him in 6 weeks for reevaluation.   Medication Adjustments/Labs and Tests Ordered: Current medicines are reviewed at length with the patient today.  Concerns regarding medicines are outlined above.  Medication changes, Labs and Tests ordered today are listed in the Patient Instructions below. Patient Instructions  Medication Instructions:  INCREASE Entresto to 49/51 mg two times daily  Labwork: Please return for labs in 4 weeks (BMET,ProBNP)-lab orders provided  Follow-Up: 6 weeks with Dr. Claiborne Billings  Any Other Special Instructions Will Be Listed Below (If Applicable).     If you need a refill on your cardiac medications before your next appointment, please call your pharmacy.  Time spent: 40 minutes Signed, Shelva Majestic, MD  01/28/2018 2:48 PM    [facility] [address] [place], [place],   [address] Phone: [phone]

## 2018-01-28 ENCOUNTER — Encounter: Payer: Self-pay | Admitting: Cardiovascular Disease

## 2018-02-16 ENCOUNTER — Other Ambulatory Visit: Payer: Self-pay | Admitting: Cardiovascular Disease

## 2018-02-23 ENCOUNTER — Other Ambulatory Visit: Payer: Self-pay | Admitting: *Deleted

## 2018-02-23 ENCOUNTER — Telehealth: Payer: Self-pay | Admitting: *Deleted

## 2018-02-23 DIAGNOSIS — I255 Ischemic cardiomyopathy: Secondary | ICD-10-CM | POA: Diagnosis not present

## 2018-02-23 DIAGNOSIS — R600 Localized edema: Secondary | ICD-10-CM | POA: Diagnosis not present

## 2018-02-23 DIAGNOSIS — I110 Hypertensive heart disease with heart failure: Secondary | ICD-10-CM | POA: Diagnosis not present

## 2018-02-23 DIAGNOSIS — I5042 Chronic combined systolic (congestive) and diastolic (congestive) heart failure: Secondary | ICD-10-CM | POA: Diagnosis not present

## 2018-02-23 DIAGNOSIS — Z79899 Other long term (current) drug therapy: Secondary | ICD-10-CM | POA: Diagnosis not present

## 2018-02-23 LAB — BASIC METABOLIC PANEL
BUN / CREAT RATIO: 17 (ref 10–24)
BUN: 21 mg/dL (ref 8–27)
CHLORIDE: 103 mmol/L (ref 96–106)
CO2: 27 mmol/L (ref 20–29)
Calcium: 10.1 mg/dL (ref 8.6–10.2)
Creatinine, Ser: 1.21 mg/dL (ref 0.76–1.27)
GFR calc Af Amer: 66 mL/min/{1.73_m2} (ref >59)
GFR calc non Af Amer: 57 mL/min/{1.73_m2} — ABNORMAL LOW (ref >59)
Glucose: 86 mg/dL (ref 65–99)
POTASSIUM: 3.8 mmol/L (ref 3.5–5.2)
SODIUM: 144 mmol/L (ref 134–144)

## 2018-02-23 LAB — PRO B NATRIURETIC PEPTIDE: NT-Pro BNP: 2643 pg/mL — ABNORMAL HIGH (ref 0–486)

## 2018-02-23 MED ORDER — SACUBITRIL-VALSARTAN 49-51 MG PO TABS: 1.0000 | ORAL_TABLET | Freq: Two times a day (BID) | ORAL | 0 refills | Status: DC

## 2018-02-23 NOTE — Telephone Encounter (Signed)
The patient came in for labs today. He stated that he needed samples of Entresto since he was currently out.  Samples have been provided:  Drug name: Entresto       Strength: 49-51       Qty: 1 box  LOT: [id number]  Exp.Date: 1/21  He also stated that he has financial assistance through [employer] so a refill has been sent in for him.

## 2018-02-24 ENCOUNTER — Telehealth: Payer: Self-pay | Admitting: Cardiovascular Disease

## 2018-02-24 NOTE — Telephone Encounter (Signed)
PA submitted via Covermymeds-pending response.

## 2018-02-24 NOTE — Telephone Encounter (Signed)
PA Case: [id number], Status: Approved, Coverage Starts on: 02/24/2018 12:00:00 AM, Coverage Ends on: 02/24/2020 12:00:00 AM

## 2018-02-24 NOTE — Telephone Encounter (Signed)
Liborio NixonJanice ( [employer]) is calling because the prior authorization for Sherryll Burgerntresto needs to initiated  thru our office with [employer] . Please call them at [phone] and fax the prior authorization outcome to [employer] at  fax no# [phone]

## 2018-02-28 NOTE — Telephone Encounter (Signed)
Returned call.   Approval date range given and approval letter faxed to # provided.

## 2018-03-04 ENCOUNTER — Telehealth: Payer: Self-pay | Admitting: Cardiovascular Disease

## 2018-03-04 NOTE — Telephone Encounter (Signed)
Pt c/o medication issue:  1. Name of Medication: Entresto   2. How are you currently taking this medication (dosage and times per day)? 49-51 mg  3. Are you having a reaction (difficulty breathing--STAT)? no  4. What is your medication issue? Wayla calling with [employer], needing a ICD code for the medication.

## 2018-03-04 NOTE — Telephone Encounter (Signed)
Spoke with [employer] patient assistance. Assistance is denied d/t out of pocket cost not meeting the 10% exception rule. Routed to primary nurse. No additional ICD-10 needed.

## 2018-03-10 ENCOUNTER — Other Ambulatory Visit: Payer: Self-pay | Admitting: Cardiovascular Disease

## 2018-03-28 ENCOUNTER — Other Ambulatory Visit: Payer: Self-pay | Admitting: Cardiovascular Disease

## 2018-03-28 NOTE — Telephone Encounter (Signed)
Rx has been sent to the pharmacy electronically. ° °

## 2018-04-14 ENCOUNTER — Encounter: Payer: Self-pay | Admitting: Cardiovascular Disease

## 2018-04-14 ENCOUNTER — Ambulatory Visit: Payer: Medicare HMO | Admitting: Cardiovascular Disease

## 2018-04-14 VITALS — BP 146/92 | HR 73 | Ht 62.0 in | Wt 127.4 lb

## 2018-04-14 DIAGNOSIS — I255 Ischemic cardiomyopathy: Secondary | ICD-10-CM

## 2018-04-14 DIAGNOSIS — I2510 Atherosclerotic heart disease of native coronary artery without angina pectoris: Secondary | ICD-10-CM | POA: Diagnosis not present

## 2018-04-14 DIAGNOSIS — Z951 Presence of aortocoronary bypass graft: Secondary | ICD-10-CM

## 2018-04-14 DIAGNOSIS — I481 Persistent atrial fibrillation: Secondary | ICD-10-CM | POA: Diagnosis not present

## 2018-04-14 DIAGNOSIS — I10 Essential (primary) hypertension: Secondary | ICD-10-CM

## 2018-04-14 DIAGNOSIS — Z79899 Other long term (current) drug therapy: Secondary | ICD-10-CM

## 2018-04-14 DIAGNOSIS — I5042 Chronic combined systolic (congestive) and diastolic (congestive) heart failure: Secondary | ICD-10-CM | POA: Diagnosis not present

## 2018-04-14 DIAGNOSIS — I2583 Coronary atherosclerosis due to lipid rich plaque: Secondary | ICD-10-CM

## 2018-04-14 DIAGNOSIS — I4819 Other persistent atrial fibrillation: Secondary | ICD-10-CM

## 2018-04-14 MED ORDER — SACUBITRIL-VALSARTAN 97-103 MG PO TABS: 1.0000 | ORAL_TABLET | Freq: Two times a day (BID) | ORAL | 3 refills | Status: DC

## 2018-04-14 NOTE — Progress Notes (Signed)
Cardiology Office Note    Date:  04/16/2018   ID:  [name] [name], DOB 07/28/1941, MRN [id number]  PCP:  Orpah Melter, MD  Cardiologist:  Shelva Majestic, MD   F/U office evaluation.  Initial office evaluation 11/19/2017  History of Present Illness:  [name] [name] is a 77 y.o. male  who presents for a 3 month follow-up cardiology evaluation .  [name] [name] is originally from [place].  In March 2017 he presented with accelerated hypertension and increasing shortness of breath.  An echo Doppler study showed an EF of 35%.  He was referred for right and left heart catheterization which was done by me on 03/04/2016.  This revealed severe multivessel CAD with coronary calcification with 50% stenosis in the proximal LAD and diagonal, followed by a napkin ring 75% focal stenosis between the first and second diagonal branch.  There was 50% proximal circumflex stenosis with total occlusion of the OM1 vessel with retrograde collaterals arising from the LAD.  The RCA was totally occluded in its mid segment and there was a 95% stenosis in the acute marginal branch.  There was collaterals to the distal RCA via the distal circumflex and distal LAD.  He did, mild to moderate elevation of right heart pressures.  EF was 35%, consistent with ischemic heart myopathy.  He underwent CABGx4 revascularization surgery by Dr. Tharon Aquas Tright with a LIMA to LAD, SVG to OM, SVG to PDA, and SVG to diagonal.  He also underwent clipping of the left atrial appendage and left carotid endarterectomy.  He had developed atrial fibrillation postoperatively.  In addition, he developed a pleural effusion which required thoracentesis by Dr. Nils Pyle in the outpatient setting.  He saw [name] for initial outpatient follow-up in April 2017.  On 04/16/2016.  He underwent cardioversion with restoration of sinus rhythm.  He has not been seen by cardiology since.  I had not seen him since his original hospitalization until he presented to my  office in September 2018.  At that time, he denied any recurrent anginal symptoms or significant dyspnea or palpitations.  He was on amiodarone 200 mg daily, carvedilol 3.125 mg twice a day and was unaware of recurrent atrial fibrillation.  He also has been taking Lasix 40 mg daily, Protonix for GERD and atorvastatin 80 mg for hyperlipidemia.    When I initially saw him, his physical examination suggested a significant murmur of mitral regurgitation.  I scheduled him for 2-D echo Doppler study, and also discussed potential future ARNI therapy.  An echo Doppler study on 09/17/2017 showed further depression of LV function with an EF of 25-30% with diffuse hypocontractility and akinesis of the inferolateral wall.  He had grade 3 diastolic dysfunction.  There was moderate mitral regurgitation, severe LA dilation, moderate RA dilation, and moderate aortic insufficiency.  PA pressure was significant increased at 85 mm.  Laboratory and show normal thyroid function.  He was not anemic.  He had stable renal function and LFTs.  Lipid studies revealed a total cholesterol 124, triglycerides 60, HDL 56, and LDL 56.   When I saw him on 11/19/2017, I discontinued valsartan and started him on Entresto 24/26 twice a day.  I also discussed reinstitution with anticoagulation which he was adamant that he would not do.  As result, I added Plavix 75 mg daily in addition to his aspirin 81 mg.  We also discussed potential need for future ICD implantation.  Over the past 2 months, his breathing has improved.  He  still notes some mild ankle swelling bilaterally.  He denies palpitations.  Follow-up laboratory in 12/16/2017 showed a hemoglobin of 14.1, hematocrit 41.2.  ProBNP was elevated at 3754.  Creatinine was stable at 1.26.  He was seen by Raquel PharmD in our office on 12/16/2017.  Carvedilol was increased to 6.25 mg twice a day.  She is working on the [employer] patient assistance program.   When I last saw him in February 2019,  was stable and I titrated Entresto to 49/51 mg twice a day repeat blood work March 2019 showed a creatinine that was stable at 1.21.  proBNP was still elevated but improved at 2643.  Feels well.  He denies chest pain or shortness of breath.  He continues to experience mild ankle edema which is better.  Mains adamant that he does not want to initiate a coagulation therapy.  He presents for evaluation.  Past Medical History:  Diagnosis Date  . Atrial fibrillation ([place]) 03/21/2016    Past Surgical History:  Procedure Laterality Date  . CARDIAC CATHETERIZATION N/A 03/04/2016   Procedure: Right/Left Heart Cath and Coronary Angiography;  Surgeon: Troy Sine, MD;  Location: [facility];  Service: Cardiovascular;  Laterality: N/A;  . CARDIOVERSION N/A 04/16/2016   Procedure: CARDIOVERSION;  Surgeon: Jerline Pain, MD;  Location: [facility] ENDOSCOPY;  Service: Cardiovascular;  Laterality: N/A;  . CLIPPING OF ATRIAL APPENDAGE Left 03/10/2016   Procedure: CLIPPING OF ATRIAL APPENDAGE;  Surgeon: Ivin Poot, MD;  Location: [facility];  Service: Open Heart Surgery;  Laterality: Left;  . CORONARY ARTERY BYPASS GRAFT N/A 03/10/2016   Procedure: CORONARY ARTERY BYPASS GRAFTING (CABG)TIMES 4 USING LEFT INTERNAL MAMMARY AND BILATERAL GREATER SAPHENOUS VEIN HARVESTED BY ENDOVIEN;  Surgeon: Ivin Poot, MD;  Location: [facility];  Service: Open Heart Surgery;  Laterality: N/A;  . ENDARTERECTOMY Left 03/10/2016   Procedure: LEFT ENDARTERECTOMY CAROTID DACRON PATCH ANGIOPLASTY;  Surgeon: Elam Dutch, MD;  Location: [facility];  Service: Vascular;  Laterality: Left;  . TEE WITHOUT CARDIOVERSION N/A 03/10/2016   Procedure: TRANSESOPHAGEAL ECHOCARDIOGRAM (TEE);  Surgeon: Ivin Poot, MD;  Location: [facility];  Service: Open Heart Surgery;  Laterality: N/A;    Current Medications: Outpatient Medications Prior to Visit  Medication Sig Dispense Refill  . amiodarone (PACERONE) 200 MG tablet TAKE 1 TABLET BY MOUTH EVERY DAY 30  tablet 6  . Ascorbic Acid (VITAMIN C) 1000 MG tablet Take 1,000 mg by mouth daily.    Marland Kitchen aspirin 81 MG tablet Take 81 mg by mouth every Monday, Wednesday, and Friday.    Marland Kitchen atorvastatin (LIPITOR) 80 MG tablet TAKE 1 TABLET BY MOUTH DAILY AT 6PM 30 tablet 5  . carvedilol (COREG) 6.25 MG tablet TAKE 1 TABLET (6.25 MG TOTAL) BY MOUTH 2 (TWO) TIMES DAILY WITH A MEAL. 60 tablet 1  . clopidogrel (PLAVIX) 75 MG tablet Take 1 tablet (75 mg total) by mouth daily. 90 tablet 3  . furosemide (LASIX) 40 MG tablet TAKE 1 TABLET BY MOUTH EVERY DAY 30 tablet 10  . KLOR-CON 10 10 MEQ tablet TAKE 1 TABLET BY MOUTH DAILY 30 tablet 5  . magnesium 30 MG tablet Take 30 mg by mouth daily.    . pantoprazole (PROTONIX) 40 MG tablet Take 1 tablet (40 mg total) by mouth daily. PLEASE MAKE APPOINTMENT FOR FURTHER REFILLS 90 tablet 0  . ENTRESTO 49-51 MG TAKE 1 TABLET BY MOUTH TWICE A DAY 180 tablet 3   No facility-administered medications prior to  visit.      Allergies:   Patient has no known allergies.   Social History   Socioeconomic History  . Marital status: Married    Spouse name: Not on file  . Number of children: Not on file  . Years of education: Not on file  . Highest education level: Not on file  Occupational History  . Not on file  Social Needs  . Financial resource strain: Not on file  . Food insecurity:    Worry: Not on file    Inability: Not on file  . Transportation needs:    Medical: Not on file    Non-medical: Not on file  Tobacco Use  . Smoking status: Former Research scientist (life sciences)  . Smokeless tobacco: Former Network engineer and Sexual Activity  . Alcohol use: Not on file  . Drug use: Not on file  . Sexual activity: Not on file  Lifestyle  . Physical activity:    Days per week: Not on file    Minutes per session: Not on file  . Stress: Not on file  Relationships  . Social connections:    Talks on phone: Not on file    Gets together: Not on file    Attends religious service: Not on file     Active member of club or organization: Not on file    Attends meetings of clubs or organizations: Not on file    Relationship status: Not on file  Other Topics Concern  . Not on file  Social History Narrative  . Not on file     Family History:  The patient's family history is not on file.  His parents are deceased.  He came to [place] in the 1960s from [place].  ROS General: Negative; No fevers, chills, or night sweats;  HEENT: Negative; No changes in vision or hearing, sinus congestion, difficulty swallowing Pulmonary: Negative; No cough, wheezing, shortness of breath, hemoptysis Cardiovascular: See history of present illness GI: Negative; No nausea, vomiting, diarrhea, or abdominal pain GU: Negative; No dysuria, hematuria, or difficulty voiding Musculoskeletal: Negative; no myalgias, joint pain, or weakness Hematologic/Oncology: Negative; no easy bruising, bleeding Endocrine: Negative; no heat/cold intolerance; no diabetes Neuro: Negative; no changes in balance, headaches Skin: Negative; No rashes or skin lesions Psychiatric: Negative; No behavioral problems, depression Sleep: Negative; No snoring, daytime sleepiness, hypersomnolence, bruxism, restless legs, hypnogognic hallucinations, no cataplexy Other comprehensive 14 point system review is negative.   PHYSICAL EXAM:   VS:  BP (!) 146/92   Pulse 73   Ht _0  (1.575 m)   Wt 127 lb 6.4 oz (57.8 kg)   BMI 23.30 kg/m     Repeat blood pressure by me was 138/88.  Wt Readings from Last 3 Encounters:  04/14/18 127 lb 6.4 oz (57.8 kg)  01/26/18 136 lb 6.4 oz (61.9 kg)  12/16/17 129 lb 6.4 oz (58.7 kg)    General: Alert, oriented, no distress.  Skin: normal turgor, no rashes, warm and dry HEENT: Normocephalic, atraumatic. Pupils equal round and reactive to light; sclera anicteric; extraocular muscles intact;  Nose without nasal septal hypertrophy Mouth/Parynx benign; Mallinpatti scale 3 Neck: No JVD, no carotid  bruits; normal carotid upstroke Lungs: clear to ausculatation and percussion; no wheezing or rales Chest wall: without tenderness to palpitation Heart: PMI not displaced, RRR, s1 s2 normal, 1/6 systolic murmur, no diastolic murmur, no rubs, gallops, thrills, or heaves Abdomen: soft, nontender; no hepatosplenomehaly, BS+; abdominal aorta nontender and not dilated by palpation. Back: no  CVA tenderness Pulses 2+ Musculoskeletal: full range of motion, normal strength, no joint deformities Extremities: 1+ ankle edema: no clubbing cyanosis or edema, Homan's sign negative  Neurologic: grossly nonfocal; Cranial nerves grossly wnl Psychologic: Normal mood and affect    Studies/Labs Reviewed:   EKG:  EKG is ordered today. ECG (independently read by me): Fibrillation at 73 bpm.  LVH.  Nonspecific intraventricular conduction delay.  ST-T changes  February 2018 ECG (independently read by me): Atrial fibrillation at 69 bpm.  Incomplete left bundle blanch block.  ST-T changes.  December 2018 ECG (independently read by me):  Probable atrial fibrillation with ventricular rate in the 60s.  LVH with repolarization changes.  QTc interval 464 ms.  September 2018 ECG (independently read by me): Normal sinus rhythm at 64.  First AV block with a PR interval 204 ms.  LVH with repolarization changes.  QTc interval 470 ms.  Recent Labs: BMP Latest Ref Rng & Units 02/23/2018 12/16/2017 09/10/2017  Glucose 65 - 99 mg/dL 86 86 91  BUN 8 - 27 mg/dL _0 Creatinine 0.76 - 1.27 mg/dL 1.21 1.26 1.02  BUN/Creat Ratio 10 - _1 Sodium 134 - 144 mmol/L 144 145(H) 143  Potassium 3.5 - 5.2 mmol/L 3.8 4.6 4.0  Chloride 96 - 106 mmol/L 103 103 101  CO2 20 - 29 mmol/L _2 Calcium 8.6 - 10.2 mg/dL 10.1 10.5(H) 10.5(H)     Hepatic Function Latest Ref Rng & Units 12/16/2017 09/10/2017 03/09/2016  Total Protein 6.0 - 8.5 g/dL 7.0 7.0 5.7(L)  Albumin 3.5 - 4.8 g/dL 3.4(L) 4.0 3.1(L)  AST 0 - 40 IU/L 23 25 32    ALT 0 - 44 IU/L 27 31 37  Alk Phosphatase 39 - 117 IU/L 92 93 62  Total Bilirubin 0.0 - 1.2 mg/dL 0.7 1.0 1.1    CBC Latest Ref Rng & Units 12/16/2017 09/10/2017 04/13/2016  WBC 3.4 - 10.8 x10E3/uL 10.4 9.5 9.9  Hemoglobin 13.0 - 17.7 g/dL 14.1 14.3 12.7(L)  Hematocrit 37.5 - 51.0 % 41.2 40.9 38.1(L)  Platelets 150 - 379 x10E3/uL 223 192 247   Lab Results  Component Value Date   MCV 97 12/16/2017   MCV 97 09/10/2017   MCV 93.4 04/13/2016   Lab Results  Component Value Date   TSH 2.120 09/10/2017   Lab Results  Component Value Date   HGBA1C 5.4 03/04/2016     BNP    Component Value Date/Time   BNP 513.7 (H) 03/02/2016 1421    ProBNP    Component Value Date/Time   PROBNP 2,643 (H) 02/23/2018 0925     Lipid Panel     Component Value Date/Time   CHOL 124 09/10/2017 1202   TRIG 60 09/10/2017 1202   HDL 56 09/10/2017 1202   CHOLHDL 2.2 09/10/2017 1202   CHOLHDL 4.5 03/04/2016 0400   VLDL 13 03/04/2016 0400   LDLCALC 56 09/10/2017 1202     RADIOLOGY: No results found.   Additional studies/ records that were reviewed today include:  I reviewed his catheterization report,Echo Doppler data, hospital records, office visits with Dr. Nils Pyle and Tarri Fuller.  03/04/2016.  Cardiac catheterization Conclusion    RPDA lesion, 95% stenosed.  Prox RCA lesion, 80% stenosed.  Dist RCA lesion, 100% stenosed.  LM lesion, 50% stenosed.  Ost 1st Diag to 1st Diag lesion, 50% stenosed.  Prox LAD lesion, 75% stenosed.  1st Mrg lesion, 100% stenosed.  There is moderate  left ventricular systolic dysfunction.   Severe multivessel CAD with coronary calcification and 50% stenosis in the proximal LAD and diagonal vessel followed by a napkin ring 75% focal stenosis between the first and second diagonal branch; 50% proximal circumflex stenosis with total occlusion of the OM1 vessel with retrograde collaterals arising from the LAD; and total occlusion of the mid RCA with 95%  stenosis in the acute margin branch, and evidence for collaterals to the distal RCA via the distal circumflex and distal LAD.  Mild to moderate elevation of right heart pressures.  Moderately severe LV dysfunction with more pronounced inferior hypocontractility and EF of 35%  consistent with an ischemic cardiomyopathy.  RECOMMENDATION: Surgical consultation for consideration of CABG revascularization surgery.      ------------------------------------------------------------------- 03/03/2016 ECHO Study Conclusions  - Left ventricle: The cavity size was normal. There was mild   concentric hypertrophy. Systolic function was moderately reduced.   The estimated ejection fraction was in the range of 35% to 40%.   Diffuse hypokinesis with akinesis of the basal and mid   inferolateral walls. Features are consistent with a pseudonormal   left ventricular filling pattern, with concomitant abnormal   relaxation and increased filling pressure (grade 2 diastolic   dysfunction). Doppler parameters are consistent with elevated   ventricular end-diastolic filling pressure. - Aortic valve: Structurally normal valve. There was mild   regurgitation. - Aorta: The aorta was normal, not dilated, and non-diseased. - Mitral valve: Structurally normal valve. There was moderate   regurgitation. - Left atrium: The atrium was mildly dilated. - Right ventricle: The cavity size was normal. Wall thickness was   normal. Systolic function was normal. - Right atrium: The atrium was normal in size. - Tricuspid valve: There was mild regurgitation. - Pulmonary arteries: Systolic pressure was mildly increased. PA   peak pressure: 38 mm Hg (S). - Pericardium, extracardiac: The pericardium was normal in   appearance. There was a left pleural effusion.   ASSESSMENT:    1. Ischemic cardiomyopathy   2. Coronary artery disease due to lipid rich plaque   3. S/P CABG x 4   4. Persistent atrial fibrillation  ([place])   5. Chronic combined systolic and diastolic heart failure ([place])   6. Essential hypertension   7. Medication management     PLAN:  [name] [name] is a 77 year old gentleman who is originally from [place].  He was found to have an ischemic cardiomyopathy with accelerated hypertension and multivessel CAD in March 2017.  He underwent  CABG revascularization surgery 4.  He developed postoperative atrial fibrillation and also developed a subsequent left pleural effusion which ultimately required thoracentesis.  He underwent cardioversion for atrial fibrillation in May 2017.  Apparently, he had been on eliquis at that time, but developed significant nose bleeding s and has not been on anticoagulation.  When I saw him for by initial evaluation in December following his hospitalization I discussed resumption of anticoagulation.  He was adamant that he would not start this.  As result, he has been on aspirin and Plavix.  He is aware of risk of thromboembolism function he today confirms that he is in atrial fibrillation.  He is aware that there is increased risk for thromboembolism if not on anticoagulation and he excepts this risk.  The most recent echo Doppler data demonstrates reduced LV function with an EF of 25 - 30%.  Tolerated initiation of Entresto.  His blood pressure today will.  As result I will now further titrate him to  optimal dosing at 97/103 mg twice a day.  Discussed potential benefit for restoration of sinus rhythm with his LV dysfunction.,  I will not increase amiodarone from its present low dose which is helping to control his ventricular rate.  He will repeat blood work in 4 weeks following dose titration of Entresto.  With the increased Entresto dose I will not increase furosemide with his residual leg swelling continue the present dose at 40 mg daily.  If he continues to be against anticoagulation, I will most likely discontinue amiodarone and additional medication for optimal rate control will  be necessary.  I will see him in 2 to 3 months for reevaluation.  Medication Adjustments/Labs and Tests Ordered: Current medicines are reviewed at length with the patient today.  Concerns regarding medicines are outlined above.  Medication changes, Labs and Tests ordered today are listed in the Patient Instructions below. Patient Instructions  Medication Instructions:  INCREASE Entresto to 97/103 mg two times daily  Labwork: Please return for labs in 1 month (CMET, ProBNP)  Our in office lab hours are Monday-Friday 8:00-4:00, closed for lunch 12:45-1:45 pm.  No appointment needed.  Follow-Up: 3 months with Dr. Claiborne Billings  Any Other Special Instructions Will Be Listed Below (If Applicable).     If you need a refill on your cardiac medications before your next appointment, please call your pharmacy.     Time spent: 40 minutes Signed, Shelva Majestic, MD  04/16/2018 10:50 AM    [facility] [address] [place], [place], [place]  [address] Phone: [phone]

## 2018-04-14 NOTE — Patient Instructions (Addendum)
Medication Instructions:  INCREASE Entresto to 97/103 mg two times daily  Labwork: Please return for labs in 1 month (CMET, ProBNP)  Our in office lab hours are Monday-Friday 8:00-4:00, closed for lunch 12:45-1:45 pm.  No appointment needed.  Follow-Up: 3 months with Dr. Tresa Endo  Any Other Special Instructions Will Be Listed Below (If Applicable).     If you need a refill on your cardiac medications before your next appointment, please call your pharmacy.

## 2018-04-16 ENCOUNTER — Encounter: Payer: Self-pay | Admitting: Cardiovascular Disease

## 2018-04-25 ENCOUNTER — Other Ambulatory Visit: Payer: Self-pay | Admitting: Cardiovascular Disease

## 2018-04-25 NOTE — Telephone Encounter (Signed)
Rx has been sent to the pharmacy electronically. ° °

## 2018-06-09 ENCOUNTER — Other Ambulatory Visit: Payer: Self-pay | Admitting: Cardiovascular Disease

## 2018-06-13 ENCOUNTER — Other Ambulatory Visit: Payer: Self-pay | Admitting: Cardiovascular Disease

## 2018-08-11 ENCOUNTER — Other Ambulatory Visit: Payer: Self-pay | Admitting: Cardiovascular Disease

## 2018-08-12 NOTE — Telephone Encounter (Signed)
Rx sent to pharmacy   

## 2018-08-30 ENCOUNTER — Ambulatory Visit: Payer: Medicare HMO | Admitting: Cardiovascular Disease

## 2018-08-30 ENCOUNTER — Encounter

## 2018-08-30 ENCOUNTER — Encounter: Payer: Self-pay | Admitting: Cardiovascular Disease

## 2018-08-30 VITALS — BP 110/80 | HR 63 | Ht 62.0 in | Wt 120.8 lb

## 2018-08-30 DIAGNOSIS — E785 Hyperlipidemia, unspecified: Secondary | ICD-10-CM | POA: Diagnosis not present

## 2018-08-30 DIAGNOSIS — I481 Persistent atrial fibrillation: Secondary | ICD-10-CM | POA: Diagnosis not present

## 2018-08-30 DIAGNOSIS — Z951 Presence of aortocoronary bypass graft: Secondary | ICD-10-CM

## 2018-08-30 DIAGNOSIS — I5042 Chronic combined systolic (congestive) and diastolic (congestive) heart failure: Secondary | ICD-10-CM | POA: Diagnosis not present

## 2018-08-30 DIAGNOSIS — I4819 Other persistent atrial fibrillation: Secondary | ICD-10-CM

## 2018-08-30 DIAGNOSIS — I10 Essential (primary) hypertension: Secondary | ICD-10-CM | POA: Diagnosis not present

## 2018-08-30 DIAGNOSIS — I255 Ischemic cardiomyopathy: Secondary | ICD-10-CM

## 2018-08-30 DIAGNOSIS — Z79899 Other long term (current) drug therapy: Secondary | ICD-10-CM | POA: Diagnosis not present

## 2018-08-30 MED ORDER — AMIODARONE HCL 200 MG PO TABS: 100.0000 mg | ORAL_TABLET | Freq: Every day | ORAL | 6 refills | Status: DC

## 2018-08-30 MED ORDER — CARVEDILOL 12.5 MG PO TABS: 12.5000 mg | ORAL_TABLET | Freq: Two times a day (BID) | ORAL | 3 refills | Status: DC

## 2018-08-30 NOTE — Patient Instructions (Signed)
Medication Instructions:  HOLD Amiodarone tomorrow --Thursday restart at 100 mg (1/2 tablet) daily  Tomorrow: INCREASE carvedilol (Coreg) to 12.5 mg two times daily  Follow-Up: 10/28 at 10:40 AM with Dr. Tresa EndoKelly  Any Other Special Instructions Will Be Listed Below (If Applicable).     If you need a refill on your cardiac medications before your next appointment, please call your pharmacy.

## 2018-08-30 NOTE — Progress Notes (Signed)
Cardiology Office Note    Date:  08/31/2018   ID:  [name] [name], DOB [birth year], MRN [id number]  PCP:  Orpah Melter, MD  Cardiologist:  Shelva Majestic, MD   F/U office evaluation.  Initial office evaluation 11/19/2017  History of Present Illness:  [name] [name] is a 77 y.o. male  who presents for a 4 month follow-up cardiology evaluation .  [name] [name] is originally from [place].  In March 2017 he presented with accelerated hypertension and increasing shortness of breath.  An echo Doppler study showed an EF of 35%.  He was referred for right and left heart catheterization which was done by me on 03/04/2016.  This revealed severe multivessel CAD with coronary calcification with 50% stenosis in the proximal LAD and diagonal, followed by a napkin ring 75% focal stenosis between the first and second diagonal branch.  There was 50% proximal circumflex stenosis with total occlusion of the OM1 vessel with retrograde collaterals arising from the LAD.  The RCA was totally occluded in its mid segment and there was a 95% stenosis in the acute marginal branch.  There was collaterals to the distal RCA via the distal circumflex and distal LAD.  He did, mild to moderate elevation of right heart pressures.  EF was 35%, consistent with ischemic heart myopathy.  He underwent CABGx4 revascularization surgery by Dr. Tharon Aquas Tright with a LIMA to LAD, SVG to OM, SVG to PDA, and SVG to diagonal.  He also underwent clipping of the left atrial appendage and left carotid endarterectomy.  He had developed atrial fibrillation postoperatively.  In addition, he developed a pleural effusion which required thoracentesis by Dr. Nils Pyle in the outpatient setting.  He saw [name] for initial outpatient follow-up in April 2017.  On 04/16/2016.  He underwent cardioversion with restoration of sinus rhythm.  He has not been seen by cardiology since.  I had not seen him since his original hospitalization until he presented to my  office in September 2018.  At that time, he denied any recurrent anginal symptoms or significant dyspnea or palpitations.  He was on amiodarone 200 mg daily, carvedilol 3.125 mg twice a day and was unaware of recurrent atrial fibrillation.  He also has been taking Lasix 40 mg daily, Protonix for GERD and atorvastatin 80 mg for hyperlipidemia.    When I initially saw him, his physical examination suggested a significant murmur of mitral regurgitation.  I scheduled him for 2-D echo Doppler study, and also discussed potential future ARNI therapy.  An echo Doppler study on 09/17/2017 showed further depression of LV function with an EF of 25-30% with diffuse hypocontractility and akinesis of the inferolateral wall.  He had grade 3 diastolic dysfunction.  There was moderate mitral regurgitation, severe LA dilation, moderate RA dilation, and moderate aortic insufficiency.  PA pressure was significant increased at 85 mm.  Laboratory and show normal thyroid function.  He was not anemic.  He had stable renal function and LFTs.  Lipid studies revealed a total cholesterol 124, triglycerides 60, HDL 56, and LDL 56.   When I saw him on 11/19/2017, I discontinued valsartan and started him on Entresto 24/26 twice a day.  I also discussed reinstitution with anticoagulation which he was adamant that he would not do.  As result, I added Plavix 75 mg daily in addition to his aspirin 81 mg.  We also discussed potential need for future ICD implantation.  Over the past 2 months, his breathing has improved.  He  still notes some mild ankle swelling bilaterally.  He denies palpitations.  Follow-up laboratory in 12/16/2017 showed a hemoglobin of 14.1, hematocrit 41.2.  ProBNP was elevated at 3754.  Creatinine was stable at 1.26.  He was seen by Raquel PharmD in our office on 12/16/2017.  Carvedilol was increased to 6.25 mg twice a day.  She is working on the [employer] patient assistance program.   In February 2019, I titrated Entresto to  49/51 mg twice a day.  Repeat blood work March 2019 showed a creatinine that was stable at 1.21.  ProBNP was still elevated but improved at 2643.  At that time he continued to experience mild peripheral edema.  When I last saw him in May 2019 he was feeling significantly improved on the increase Entresto regimen.  He has been in atrial fibrillation and during that evaluation I again had a thorough discussion with him regarding increased risk for thromboembolism without anticoagulation therapy and he again reiterated that he had no intention to initiate anticoagulation.  During his evaluation I titrated Entresto to optimal dosing at 97/103 mg twice a day.  I recommended he continue his current dose of furosemide at 40 mg.  Over the past several months, he has continued to feel well.  He feels that Delene Loll is made a significant improvement in his sense of well-being.  He denies chest pain.  He denies PND orthopnea.  He presents for evaluation.  Past Medical History:  Diagnosis Date  . Atrial fibrillation ([place]) 03/21/2016    Past Surgical History:  Procedure Laterality Date  . CARDIAC CATHETERIZATION N/A 03/04/2016   Procedure: Right/Left Heart Cath and Coronary Angiography;  Surgeon: Troy Sine, MD;  Location: [place] [facility] CV LAB;  Service: Cardiovascular;  Laterality: N/A;  . CARDIOVERSION N/A 04/16/2016   Procedure: CARDIOVERSION;  Surgeon: Jerline Pain, MD;  Location: [facility] ENDOSCOPY;  Service: Cardiovascular;  Laterality: N/A;  . CLIPPING OF ATRIAL APPENDAGE Left 03/10/2016   Procedure: CLIPPING OF ATRIAL APPENDAGE;  Surgeon: Ivin Poot, MD;  Location: [facility];  Service: Open Heart Surgery;  Laterality: Left;  . CORONARY ARTERY BYPASS GRAFT N/A 03/10/2016   Procedure: CORONARY ARTERY BYPASS GRAFTING (CABG)TIMES 4 USING LEFT INTERNAL MAMMARY AND BILATERAL GREATER SAPHENOUS VEIN HARVESTED BY ENDOVIEN;  Surgeon: Ivin Poot, MD;  Location: [place];  Service: Open Heart Surgery;  Laterality: N/A;  .  ENDARTERECTOMY Left 03/10/2016   Procedure: LEFT ENDARTERECTOMY CAROTID DACRON PATCH ANGIOPLASTY;  Surgeon: Elam Dutch, MD;  Location: [place];  Service: Vascular;  Laterality: Left;  . TEE WITHOUT CARDIOVERSION N/A 03/10/2016   Procedure: TRANSESOPHAGEAL ECHOCARDIOGRAM (TEE);  Surgeon: Ivin Poot, MD;  Location: [place];  Service: Open Heart Surgery;  Laterality: N/A;    Current Medications: Outpatient Medications Prior to Visit  Medication Sig Dispense Refill  . Ascorbic Acid (VITAMIN C) 1000 MG tablet Take 1,000 mg by mouth daily.    Marland Kitchen aspirin 81 MG tablet Take 81 mg by mouth every Monday, Wednesday, and Friday.    Marland Kitchen atorvastatin (LIPITOR) 80 MG tablet TAKE 1 TABLET BY MOUTH DAILY AT 6PM 30 tablet 5  . atorvastatin (LIPITOR) 80 MG tablet Take 1 tablet (80 mg total) by mouth daily at 6 PM. 90 tablet 3  . clopidogrel (PLAVIX) 75 MG tablet Take 1 tablet (75 mg total) by mouth daily. 90 tablet 3  . furosemide (LASIX) 40 MG tablet TAKE 1 TABLET BY MOUTH EVERY DAY 30 tablet 10  . KLOR-CON 10 10 MEQ  tablet TAKE 1 TABLET BY MOUTH DAILY 30 tablet 5  . KLOR-CON 10 10 MEQ tablet TAKE 1 TABLET BY MOUTH EVERY DAY 90 tablet 2  . magnesium 30 MG tablet Take 30 mg by mouth daily.    . pantoprazole (PROTONIX) 40 MG tablet Take 1 tablet (40 mg total) by mouth daily. PLEASE MAKE APPOINTMENT FOR FURTHER REFILLS 90 tablet 0  . sacubitril-valsartan (ENTRESTO) 97-103 MG Take 1 tablet by mouth 2 (two) times daily. 180 tablet 3  . amiodarone (PACERONE) 200 MG tablet TAKE 1 TABLET BY MOUTH EVERY DAY 30 tablet 6  . amiodarone (PACERONE) 200 MG tablet TAKE 1 TABLET (200 MG TOTAL) BY MOUTH DAILY. 90 tablet 0  . carvedilol (COREG) 6.25 MG tablet TAKE 1 TABLET (6.25 MG TOTAL) BY MOUTH 2 (TWO) TIMES DAILY WITH A MEAL. 180 tablet 2   No facility-administered medications prior to visit.      Allergies:   Patient has no known allergies.   Social History   Socioeconomic History  . Marital status: Married     Spouse name: Not on file  . Number of children: Not on file  . Years of education: Not on file  . Highest education level: Not on file  Occupational History  . Not on file  Social Needs  . Financial resource strain: Not on file  . Food insecurity:    Worry: Not on file    Inability: Not on file  . Transportation needs:    Medical: Not on file    Non-medical: Not on file  Tobacco Use  . Smoking status: Former Research scientist (life sciences)  . Smokeless tobacco: Former Network engineer and Sexual Activity  . Alcohol use: Not on file  . Drug use: Not on file  . Sexual activity: Not on file  Lifestyle  . Physical activity:    Days per week: Not on file    Minutes per session: Not on file  . Stress: Not on file  Relationships  . Social connections:    Talks on phone: Not on file    Gets together: Not on file    Attends religious service: Not on file    Active member of club or organization: Not on file    Attends meetings of clubs or organizations: Not on file    Relationship status: Not on file  Other Topics Concern  . Not on file  Social History Narrative  . Not on file     Family History:  The patient's family history is not on file.  His parents are deceased.  He came to [place] in the 1960s from [place].  ROS General: Negative; No fevers, chills, or night sweats;  HEENT: Negative; No changes in vision or hearing, sinus congestion, difficulty swallowing Pulmonary: Negative; No cough, wheezing, shortness of breath, hemoptysis Cardiovascular: See history of present illness GI: Negative; No nausea, vomiting, diarrhea, or abdominal pain GU: Negative; No dysuria, hematuria, or difficulty voiding Musculoskeletal: Negative; no myalgias, joint pain, or weakness Hematologic/Oncology: Negative; no easy bruising, bleeding Endocrine: Negative; no heat/cold intolerance; no diabetes Neuro: Negative; no changes in balance, headaches Skin: Negative; No rashes or skin lesions Psychiatric: Negative;  No behavioral problems, depression Sleep: Negative; No snoring, daytime sleepiness, hypersomnolence, bruxism, restless legs, hypnogognic hallucinations, no cataplexy Other comprehensive 14 point system review is negative.   PHYSICAL EXAM:   VS:  BP 110/80   Pulse 63   Ht '5\' 2"'  (1.575 m)   Wt 120 lb 12.8 oz (54.8 kg)  BMI 22.09 kg/m     Repeat blood pressure by me was 128/80.  Wt Readings from Last 3 Encounters:  08/30/18 120 lb 12.8 oz (54.8 kg)  04/14/18 127 lb 6.4 oz (57.8 kg)  01/26/18 136 lb 6.4 oz (61.9 kg)    General: Alert, oriented, no distress.  Skin: normal turgor, no rashes, warm and dry HEENT: Normocephalic, atraumatic. Pupils equal round and reactive to light; sclera anicteric; extraocular muscles intact;  Nose without nasal septal hypertrophy Mouth/Parynx benign; Mallinpatti scale 3 Neck: No JVD, no carotid bruits; normal carotid upstroke Lungs: clear to ausculatation and percussion; no wheezing or rales Chest wall: without tenderness to palpitation Heart: PMI not displaced, irregularly irregular with a controlled ventricular rate in the 60s., s1 s2 normal, 1/6 systolic murmur, no diastolic murmur, no rubs, gallops, thrills, or heaves Abdomen: soft, nontender; no hepatosplenomehaly, BS+; abdominal aorta nontender and not dilated by palpation. Back: no CVA tenderness Pulses 2+ Musculoskeletal: full range of motion, normal strength, no joint deformities Extremities: Resolution of prior edema; no clubbing,cyanosis, Homan's sign negative  Neurologic: grossly nonfocal; Cranial nerves grossly wnl Psychologic: Normal mood and affect    Studies/Labs Reviewed:   EKG:  EKG is ordered today. ECG (independently read by me): Atrial fibrillation with ventricular rate at 63 bpm.  Inferolateral ST changes.  LVH by voltage  May 2019 ECG (independently read by me): Atrial Fibrillation at 73 bpm.  LVH.  Nonspecific intraventricular conduction delay.  ST-T changes  February  2018 ECG (independently read by me): Atrial fibrillation at 69 bpm.  Incomplete left bundle blanch block.  ST-T changes.  December 2018 ECG (independently read by me):  Probable atrial fibrillation with ventricular rate in the 60s.  LVH with repolarization changes.  QTc interval 464 ms.  September 2018 ECG (independently read by me): Normal sinus rhythm at 64.  First AV block with a PR interval 204 ms.  LVH with repolarization changes.  QTc interval 470 ms.  Recent Labs: BMP Latest Ref Rng & Units 08/30/2018 02/23/2018 12/16/2017  Glucose 65 - 99 mg/dL 78 86 86  BUN 8 - 27 mg/dL '21 21 22  ' Creatinine 0.76 - 1.27 mg/dL 1.37(H) 1.21 1.26  BUN/Creat Ratio 10 - '24 15 17 17  ' Sodium 134 - 144 mmol/L 145(H) 144 145(H)  Potassium 3.5 - 5.2 mmol/L 4.0 3.8 4.6  Chloride 96 - 106 mmol/L 103 103 103  CO2 20 - 29 mmol/L '28 27 28  ' Calcium 8.6 - 10.2 mg/dL 10.1 10.1 10.5(H)     Hepatic Function Latest Ref Rng & Units 08/30/2018 12/16/2017 09/10/2017  Total Protein 6.0 - 8.5 g/dL 6.3 7.0 7.0  Albumin 3.5 - 4.8 g/dL 3.9 3.4(L) 4.0  AST 0 - 40 IU/L '18 23 25  ' ALT 0 - 44 IU/L '23 27 31  ' Alk Phosphatase 39 - 117 IU/L 81 92 93  Total Bilirubin 0.0 - 1.2 mg/dL 0.9 0.7 1.0    CBC Latest Ref Rng & Units 12/16/2017 09/10/2017 04/13/2016  WBC 3.4 - 10.8 x10E3/uL 10.4 9.5 9.9  Hemoglobin 13.0 - 17.7 g/dL 14.1 14.3 12.7(L)  Hematocrit 37.5 - 51.0 % 41.2 40.9 38.1(L)  Platelets 150 - 379 x10E3/uL 223 192 247   Lab Results  Component Value Date   MCV 97 12/16/2017   MCV 97 09/10/2017   MCV 93.4 04/13/2016   Lab Results  Component Value Date   TSH 2.120 09/10/2017   Lab Results  Component Value Date   HGBA1C 5.4 03/04/2016  BNP    Component Value Date/Time   BNP 513.7 (H) 03/02/2016 1421    ProBNP    Component Value Date/Time   PROBNP 3,065 (H) 08/30/2018 1155     Lipid Panel     Component Value Date/Time   CHOL 124 09/10/2017 1202   TRIG 60 09/10/2017 1202   HDL 56 09/10/2017 1202    CHOLHDL 2.2 09/10/2017 1202   CHOLHDL 4.5 03/04/2016 0400   VLDL 13 03/04/2016 0400   LDLCALC 56 09/10/2017 1202     RADIOLOGY: No results found.   Additional studies/ records that were reviewed today include:  I reviewed his catheterization report,Echo Doppler data, hospital records, office visits with Dr. Nils Pyle and Tarri Fuller.  03/04/2016.  Cardiac catheterization Conclusion    RPDA lesion, 95% stenosed.  Prox RCA lesion, 80% stenosed.  Dist RCA lesion, 100% stenosed.  LM lesion, 50% stenosed.  Ost 1st Diag to 1st Diag lesion, 50% stenosed.  Prox LAD lesion, 75% stenosed.  1st Mrg lesion, 100% stenosed.  There is moderate left ventricular systolic dysfunction.   Severe multivessel CAD with coronary calcification and 50% stenosis in the proximal LAD and diagonal vessel followed by a napkin ring 75% focal stenosis between the first and second diagonal branch; 50% proximal circumflex stenosis with total occlusion of the OM1 vessel with retrograde collaterals arising from the LAD; and total occlusion of the mid RCA with 95% stenosis in the acute margin branch, and evidence for collaterals to the distal RCA via the distal circumflex and distal LAD.  Mild to moderate elevation of right heart pressures.  Moderately severe LV dysfunction with more pronounced inferior hypocontractility and EF of 35%  consistent with an ischemic cardiomyopathy.  RECOMMENDATION: Surgical consultation for consideration of CABG revascularization surgery.      ------------------------------------------------------------------- 03/03/2016 ECHO Study Conclusions  - Left ventricle: The cavity size was normal. There was mild   concentric hypertrophy. Systolic function was moderately reduced.   The estimated ejection fraction was in the range of 35% to 40%.   Diffuse hypokinesis with akinesis of the basal and mid   inferolateral walls. Features are consistent with a pseudonormal   left  ventricular filling pattern, with concomitant abnormal   relaxation and increased filling pressure (grade 2 diastolic   dysfunction). Doppler parameters are consistent with elevated   ventricular end-diastolic filling pressure. - Aortic valve: Structurally normal valve. There was mild   regurgitation. - Aorta: The aorta was normal, not dilated, and non-diseased. - Mitral valve: Structurally normal valve. There was moderate   regurgitation. - Left atrium: The atrium was mildly dilated. - Right ventricle: The cavity size was normal. Wall thickness was   normal. Systolic function was normal. - Right atrium: The atrium was normal in size. - Tricuspid valve: There was mild regurgitation. - Pulmonary arteries: Systolic pressure was mildly increased. PA   peak pressure: 38 mm Hg (S). - Pericardium, extracardiac: The pericardium was normal in   appearance. There was a left pleural effusion.   ASSESSMENT:    1. Ischemic cardiomyopathy   2. S/P CABG x 4   3. Persistent atrial fibrillation ([place])   4. Hyperlipidemia LDL goal <70   5. Essential hypertension   6. Chronic combined systolic and diastolic heart failure ([place])   7. Hyperlipidemia with target LDL less than 70     PLAN:  Mr. Leicht is a 77 year old gentleman who is originally from [place].  He was found to have an ischemic cardiomyopathy with accelerated hypertension  and multivessel CAD in March 2017.  He underwent  CABG revascularization surgery 4.  He developed postoperative atrial fibrillation and also developed a subsequent left pleural effusion which ultimately required thoracentesis.  He underwent cardioversion for atrial fibrillation in May 2017.  Apparently, he had been on eliquis at that time, but developed significant nose bleeding and subsequently has not been on anticoagulation.  When I saw him for by initial evaluation in December following his hospitalization I discussed resumption of anticoagulation.  He was adamant that  he would not start this.  As result, he has been on aspirin and Plavix.  He is aware of risk of thromboembolism function he today confirms that he is in atrial fibrillation.  He is aware that there is increased risk for thromboembolism if not on anticoagulation and he excepts this risk.  The most recent echo Doppler data demonstrates reduced LV function with an EF of 25 - 30%.  He has tolerated initiation and dose titration of Entresto and is now on optimal dosing at 97/103 mg twice a day.  Clinically he feels significantly improved and has more energy.  He denies PND orthopnea.  His ECG confirms permanent atrial fibrillation.  Since he is not on anticoagulation I will not attempt any cardioversion.  I have recommended he reduce amiodarone to 100 mg daily and ultimately my plan is to discontinue this altogether.  I will titrate carvedilol to 12.5 mg twice a day.  I will see him in 4 to 6 weeks for reevaluation and at that time amiodarone will be discontinued and if necessary carvedilol dose will be further increased.  I again discussed potential stroke risk without anticoagulation.  He is fully aware of this  and remains adamant that he would not initiate anticoagulation treatment.  He continues to be on atorvastatin 80 mg for hyperlipidemia.  Most recent LDL cholesterol was 56.  I will see him in 4 to 6 weeks for reevaluation.  Medication Adjustments/Labs and Tests Ordered: Current medicines are reviewed at length with the patient today.  Concerns regarding medicines are outlined above.  Medication changes, Labs and Tests ordered today are listed in the Patient Instructions below. Patient Instructions  Medication Instructions:  HOLD Amiodarone tomorrow --Thursday restart at 100 mg (1/2 tablet) daily  Tomorrow: INCREASE carvedilol (Coreg) to 12.5 mg two times daily  Follow-Up: 10/28 at 10:40 AM with Dr. Claiborne Billings  Any Other Special Instructions Will Be Listed Below (If Applicable).     If you need a  refill on your cardiac medications before your next appointment, please call your pharmacy.     Time spent: 25 minutes Signed, Shelva Majestic, MD  08/31/2018 7:42 PM    [facility] [address] [place], [place], [place]  [address] Phone: [phone]

## 2018-08-31 ENCOUNTER — Encounter: Payer: Self-pay | Admitting: Cardiovascular Disease

## 2018-08-31 LAB — COMPREHENSIVE METABOLIC PANEL
ALBUMIN: 3.9 g/dL (ref 3.5–4.8)
ALT: 23 IU/L (ref 0–44)
AST: 18 IU/L (ref 0–40)
Albumin/Globulin Ratio: 1.6 (ref 1.2–2.2)
Alkaline Phosphatase: 81 IU/L (ref 39–117)
BUN / CREAT RATIO: 15 (ref 10–24)
BUN: 21 mg/dL (ref 8–27)
Bilirubin Total: 0.9 mg/dL (ref 0.0–1.2)
CALCIUM: 10.1 mg/dL (ref 8.6–10.2)
CO2: 28 mmol/L (ref 20–29)
Chloride: 103 mmol/L (ref 96–106)
Creatinine, Ser: 1.37 mg/dL — ABNORMAL HIGH (ref 0.76–1.27)
GFR, EST AFRICAN AMERICAN: 57 mL/min/{1.73_m2} — AB (ref >59)
GFR, EST NON AFRICAN AMERICAN: 49 mL/min/{1.73_m2} — AB (ref >59)
GLUCOSE: 78 mg/dL (ref 65–99)
Globulin, Total: 2.4 g/dL (ref 1.5–4.5)
Potassium: 4 mmol/L (ref 3.5–5.2)
Sodium: 145 mmol/L — ABNORMAL HIGH (ref 134–144)
TOTAL PROTEIN: 6.3 g/dL (ref 6.0–8.5)

## 2018-08-31 LAB — PRO B NATRIURETIC PEPTIDE: NT-Pro BNP: 3065 pg/mL — ABNORMAL HIGH (ref 0–486)

## 2018-09-01 ENCOUNTER — Telehealth: Payer: Self-pay | Admitting: Cardiovascular Disease

## 2018-09-01 DIAGNOSIS — Z79899 Other long term (current) drug therapy: Secondary | ICD-10-CM

## 2018-09-01 DIAGNOSIS — I5042 Chronic combined systolic (congestive) and diastolic (congestive) heart failure: Secondary | ICD-10-CM

## 2018-09-01 MED ORDER — SPIRONOLACTONE 25 MG PO TABS: 12.5000 mg | ORAL_TABLET | Freq: Every day | ORAL | 3 refills | Status: DC

## 2018-09-01 NOTE — Addendum Note (Signed)
Addended by: Adline PealsGOODNER, Clora Ohmer N on: 09/01/2018 09:30 AM   Modules accepted: Orders

## 2018-09-01 NOTE — Telephone Encounter (Signed)
Follow up:    patient returning call back

## 2018-09-01 NOTE — Telephone Encounter (Signed)
See result note.  

## 2018-09-16 DIAGNOSIS — I5042 Chronic combined systolic (congestive) and diastolic (congestive) heart failure: Secondary | ICD-10-CM | POA: Diagnosis not present

## 2018-09-16 DIAGNOSIS — Z79899 Other long term (current) drug therapy: Secondary | ICD-10-CM | POA: Diagnosis not present

## 2018-09-16 LAB — BASIC METABOLIC PANEL
BUN / CREAT RATIO: 17 (ref 10–24)
BUN: 25 mg/dL (ref 8–27)
CO2: 26 mmol/L (ref 20–29)
Calcium: 10.4 mg/dL — ABNORMAL HIGH (ref 8.6–10.2)
Chloride: 101 mmol/L (ref 96–106)
Creatinine, Ser: 1.47 mg/dL — ABNORMAL HIGH (ref 0.76–1.27)
GFR, EST AFRICAN AMERICAN: 52 mL/min/{1.73_m2} — AB (ref >59)
GFR, EST NON AFRICAN AMERICAN: 45 mL/min/{1.73_m2} — AB (ref >59)
GLUCOSE: 89 mg/dL (ref 65–99)
Potassium: 4.6 mmol/L (ref 3.5–5.2)
SODIUM: 140 mmol/L (ref 134–144)

## 2018-09-29 ENCOUNTER — Other Ambulatory Visit: Payer: Self-pay | Admitting: Cardiovascular Disease

## 2018-10-10 ENCOUNTER — Encounter: Payer: Self-pay | Admitting: Cardiovascular Disease

## 2018-10-10 ENCOUNTER — Ambulatory Visit: Payer: Medicare HMO | Admitting: Cardiovascular Disease

## 2018-10-10 VITALS — BP 110/70 | HR 65 | Ht 62.0 in | Wt 116.0 lb

## 2018-10-10 DIAGNOSIS — E785 Hyperlipidemia, unspecified: Secondary | ICD-10-CM

## 2018-10-10 DIAGNOSIS — Z951 Presence of aortocoronary bypass graft: Secondary | ICD-10-CM

## 2018-10-10 DIAGNOSIS — I4819 Other persistent atrial fibrillation: Secondary | ICD-10-CM | POA: Diagnosis not present

## 2018-10-10 DIAGNOSIS — I255 Ischemic cardiomyopathy: Secondary | ICD-10-CM | POA: Diagnosis not present

## 2018-10-10 DIAGNOSIS — I5042 Chronic combined systolic (congestive) and diastolic (congestive) heart failure: Secondary | ICD-10-CM | POA: Diagnosis not present

## 2018-10-10 DIAGNOSIS — I10 Essential (primary) hypertension: Secondary | ICD-10-CM | POA: Diagnosis not present

## 2018-10-10 MED ORDER — CARVEDILOL 12.5 MG PO TABS: ORAL_TABLET | ORAL | 3 refills | Status: DC

## 2018-10-10 NOTE — Patient Instructions (Signed)
Medication Instructions:  STOP amiodarone INCREASE carvedilol (Coreg) to 18.75 mg in the AM and 12.5 mg in the PM  If you need a refill on your cardiac medications before your next appointment, please call your pharmacy.   Testing/Procedures: Your physician has requested that you have an echocardiogram in 1 MONTH. Echocardiography is a painless test that uses sound waves to create images of your heart. It provides your doctor with information about the size and shape of your heart and how well your heart's chambers and valves are working. This procedure takes approximately one hour. There are no restrictions for this procedure.  Follow-Up: At [facility], you and your health needs are our priority.  As part of our continuing mission to provide you with exceptional heart care, we have created designated Provider Care Teams.  These Care Teams include your primary Cardiologist (physician) and Advanced Practice Providers (APPs -  Physician Assistants and Nurse Practitioners) who all work together to provide you with the care you need, when you need it. You will need a follow up appointment in 6 weeks (ok to use hold if possible).  Please call our office 2 months in advance to schedule this appointment.  You may see Nicki Guadalajara, MD or one of the following Advanced Practice Providers on your designated Care Team: [place], [place] . Micah Flesher, PA-C

## 2018-10-10 NOTE — Progress Notes (Signed)
Cardiology Office Note    Date:  10/10/2018   ID:  [name] [name], DOB 1941/08/25, MRN [id number]  PCP:  Orpah Melter, MD  Cardiologist:  Shelva Majestic, MD   F/U office evaluation.  Initial office evaluation 11/19/2017  History of Present Illness:  [name] [name] is a 77 y.o. male  who presents for a 6 week  follow-up cardiology evaluation .  [name] [name] is originally from [place].  In March 2017 he presented with accelerated hypertension and increasing shortness of breath.  An echo Doppler study showed an EF of 35%.  He was referred for right and left heart catheterization which was done by me on 03/04/2016.  This revealed severe multivessel CAD with coronary calcification with 50% stenosis in the proximal LAD and diagonal, followed by a napkin ring 75% focal stenosis between the first and second diagonal branch.  There was 50% proximal circumflex stenosis with total occlusion of the OM1 vessel with retrograde collaterals arising from the LAD.  The RCA was totally occluded in its mid segment and there was a 95% stenosis in the acute marginal branch.  There was collaterals to the distal RCA via the distal circumflex and distal LAD.  He did, mild to moderate elevation of right heart pressures.  EF was 35%, consistent with ischemic heart myopathy.  He underwent CABGx4 revascularization surgery by Dr. Tharon Aquas Tright with a LIMA to LAD, SVG to OM, SVG to PDA, and SVG to diagonal.  He also underwent clipping of the left atrial appendage and left carotid endarterectomy.  He had developed atrial fibrillation postoperatively.  In addition, he developed a pleural effusion which required thoracentesis by Dr. Nils Pyle in the outpatient setting.  He saw [name] for initial outpatient follow-up in April 2017.  On 04/16/2016.  He underwent cardioversion with restoration of sinus rhythm.  He has not been seen by cardiology since.  I had not seen him since his original hospitalization until he presented to my  office in September 2018.  At that time, he denied any recurrent anginal symptoms or significant dyspnea or palpitations.  He was on amiodarone 200 mg daily, carvedilol 3.125 mg twice a day and was unaware of recurrent atrial fibrillation.  He also has been taking Lasix 40 mg daily, Protonix for GERD and atorvastatin 80 mg for hyperlipidemia.    When I initially saw him, his physical examination suggested a significant murmur of mitral regurgitation.  I scheduled him for 2-D echo Doppler study, and also discussed potential future ARNI therapy.  An echo Doppler study on 09/17/2017 showed further depression of LV function with an EF of 25-30% with diffuse hypocontractility and akinesis of the inferolateral wall.  He had grade 3 diastolic dysfunction.  There was moderate mitral regurgitation, severe LA dilation, moderate RA dilation, and moderate aortic insufficiency.  PA pressure was significant increased at 85 mm.  Laboratory and show normal thyroid function.  He was not anemic.  He had stable renal function and LFTs.  Lipid studies revealed a total cholesterol 124, triglycerides 60, HDL 56, and LDL 56.   When I saw him on 11/19/2017, I discontinued valsartan and started him on Entresto 24/26 twice a day.  I also discussed reinstitution with anticoagulation which he was adamant that he would not do.  As result, I added Plavix 75 mg daily in addition to his aspirin 81 mg.  We also discussed potential need for future ICD implantation.  Over the past 2 months, his breathing has improved.  He still notes some mild ankle swelling bilaterally.  He denies palpitations.  Follow-up laboratory in 12/16/2017 showed a hemoglobin of 14.1, hematocrit 41.2.  ProBNP was elevated at 3754.  Creatinine was stable at 1.26.  He was seen by Raquel PharmD in our office on 12/16/2017.  Carvedilol was increased to 6.25 mg twice a day.  She is working on the [employer] patient assistance program.   In February 2019, I titrated Entresto to  49/51 mg twice a day.  Repeat blood work March 2019 showed a creatinine that was stable at 1.21.  ProBNP was still elevated but improved at 2643.  At that time he continued to experience mild peripheral edema.  When I last saw him in May 2019 he was feeling significantly improved on the increase Entresto regimen.  He has been in atrial fibrillation and during that evaluation I again had a thorough discussion with him regarding increased risk for thromboembolism without anticoagulation therapy and he again reiterated that he had no intention to initiate anticoagulation.  During his evaluation I titrated Entresto to optimal dosing at 97/103 mg twice a day.  I recommended he continue his current dose of furosemide at 40 mg.  Over the past several months, he has continued to feel well.  He feels that Delene Loll has made a significant improvement in his sense of well-being.   I last saw him on August 30, 2018.  At that time I again had further discussion regarding his anticoagulation and he remained steadfast in his decision not to initiate and was aware of potential thromboembolic risk.  At that time I recommended he reduce amiodarone to 100 mg daily and discussed ultimate plans to discontinue altogether and further titrate carvedilol.  Over the past month, he has felt well.  He denies PND orthopnea.  He denies episodes of tachycardia.  He denies chest pressure.  He presents for evaluation.  Past Medical History:  Diagnosis Date  . Atrial fibrillation ([place]) 03/21/2016    Past Surgical History:  Procedure Laterality Date  . CARDIAC CATHETERIZATION N/A 03/04/2016   Procedure: Right/Left Heart Cath and Coronary Angiography;  Surgeon: Troy Sine, MD;  Location: [facility];  Service: Cardiovascular;  Laterality: N/A;  . CARDIOVERSION N/A 04/16/2016   Procedure: CARDIOVERSION;  Surgeon: Jerline Pain, MD;  Location: [facility] ENDOSCOPY;  Service: Cardiovascular;  Laterality: N/A;  . CLIPPING OF ATRIAL  APPENDAGE Left 03/10/2016   Procedure: CLIPPING OF ATRIAL APPENDAGE;  Surgeon: Ivin Poot, MD;  Location: [facility];  Service: Open Heart Surgery;  Laterality: Left;  . CORONARY ARTERY BYPASS GRAFT N/A 03/10/2016   Procedure: CORONARY ARTERY BYPASS GRAFTING (CABG)TIMES 4 USING LEFT INTERNAL MAMMARY AND BILATERAL GREATER SAPHENOUS VEIN HARVESTED BY ENDOVIEN;  Surgeon: Ivin Poot, MD;  Location: [facility];  Service: Open Heart Surgery;  Laterality: N/A;  . ENDARTERECTOMY Left 03/10/2016   Procedure: LEFT ENDARTERECTOMY CAROTID DACRON PATCH ANGIOPLASTY;  Surgeon: Elam Dutch, MD;  Location: [facility];  Service: Vascular;  Laterality: Left;  . TEE WITHOUT CARDIOVERSION N/A 03/10/2016   Procedure: TRANSESOPHAGEAL ECHOCARDIOGRAM (TEE);  Surgeon: Ivin Poot, MD;  Location: [facility];  Service: Open Heart Surgery;  Laterality: N/A;    Current Medications: Outpatient Medications Prior to Visit  Medication Sig Dispense Refill  . Ascorbic Acid (VITAMIN C) 1000 MG tablet Take 1,000 mg by mouth daily.    Marland Kitchen aspirin 81 MG tablet Take 81 mg by mouth every Monday, Wednesday, and Friday.    Marland Kitchen atorvastatin (  LIPITOR) 80 MG tablet TAKE 1 TABLET BY MOUTH DAILY AT 6PM 30 tablet 5  . atorvastatin (LIPITOR) 80 MG tablet Take 1 tablet (80 mg total) by mouth daily at 6 PM. 90 tablet 3  . clopidogrel (PLAVIX) 75 MG tablet Take 1 tablet (75 mg total) by mouth daily. 90 tablet 3  . furosemide (LASIX) 40 MG tablet TAKE 1 TABLET BY MOUTH EVERY DAY 30 tablet 9  . KLOR-CON 10 10 MEQ tablet TAKE 1 TABLET BY MOUTH DAILY 30 tablet 5  . KLOR-CON 10 10 MEQ tablet TAKE 1 TABLET BY MOUTH EVERY DAY 90 tablet 2  . magnesium 30 MG tablet Take 30 mg by mouth daily.    . pantoprazole (PROTONIX) 40 MG tablet Take 1 tablet (40 mg total) by mouth daily. PLEASE MAKE APPOINTMENT FOR FURTHER REFILLS 90 tablet 0  . sacubitril-valsartan (ENTRESTO) 97-103 MG Take 1 tablet by mouth 2 (two) times daily. 180 tablet 3  . spironolactone (ALDACTONE) 25  MG tablet Take 0.5 tablets (12.5 mg total) by mouth daily. 15 tablet 3  . amiodarone (PACERONE) 200 MG tablet Take 0.5 tablets (100 mg total) by mouth daily. 30 tablet 6  . carvedilol (COREG) 12.5 MG tablet Take 1 tablet (12.5 mg total) by mouth 2 (two) times daily with a meal. 180 tablet 3   No facility-administered medications prior to visit.      Allergies:   Patient has no known allergies.   Social History   Socioeconomic History  . Marital status: Married    Spouse name: Not on file  . Number of children: Not on file  . Years of education: Not on file  . Highest education level: Not on file  Occupational History  . Not on file  Social Needs  . Financial resource strain: Not on file  . Food insecurity:    Worry: Not on file    Inability: Not on file  . Transportation needs:    Medical: Not on file    Non-medical: Not on file  Tobacco Use  . Smoking status: Former Research scientist (life sciences)  . Smokeless tobacco: Former Network engineer and Sexual Activity  . Alcohol use: Not on file  . Drug use: Not on file  . Sexual activity: Not on file  Lifestyle  . Physical activity:    Days per week: Not on file    Minutes per session: Not on file  . Stress: Not on file  Relationships  . Social connections:    Talks on phone: Not on file    Gets together: Not on file    Attends religious service: Not on file    Active member of club or organization: Not on file    Attends meetings of clubs or organizations: Not on file    Relationship status: Not on file  Other Topics Concern  . Not on file  Social History Narrative  . Not on file     Family History:  The patient's family history is not on file.  His parents are deceased.  He came to [place] in the 1960s from [place].  ROS General: Negative; No fevers, chills, or night sweats;  HEENT: Negative; No changes in vision or hearing, sinus congestion, difficulty swallowing Pulmonary: Negative; No cough, wheezing, shortness of breath,  hemoptysis Cardiovascular: See history of present illness GI: Negative; No nausea, vomiting, diarrhea, or abdominal pain GU: Negative; No dysuria, hematuria, or difficulty voiding Musculoskeletal: Negative; no myalgias, joint pain, or weakness Hematologic/Oncology: Negative; no easy  bruising, bleeding Endocrine: Negative; no heat/cold intolerance; no diabetes Neuro: Negative; no changes in balance, headaches Skin: Negative; No rashes or skin lesions Psychiatric: Negative; No behavioral problems, depression Sleep: Negative; No snoring, daytime sleepiness, hypersomnolence, bruxism, restless legs, hypnogognic hallucinations, no cataplexy Other comprehensive 14 point system review is negative.   PHYSICAL EXAM:   VS:  BP 110/70   Pulse 65   Ht '5\' 2"'  (1.575 m)   Wt 116 lb (52.6 kg)   BMI 21.22 kg/m     Repeat blood pressure by me was 120/72  Wt Readings from Last 3 Encounters:  10/10/18 116 lb (52.6 kg)  08/30/18 120 lb 12.8 oz (54.8 kg)  04/14/18 127 lb 6.4 oz (57.8 kg)    General: Alert, oriented, no distress.  Skin: normal turgor, no rashes, warm and dry HEENT: Normocephalic, atraumatic. Pupils equal round and reactive to light; sclera anicteric; extraocular muscles intact;  Nose without nasal septal hypertrophy Mouth/Parynx benign; Mallinpatti scale 3 Neck: No JVD, no carotid bruits; normal carotid upstroke Lungs: clear to ausculatation and percussion; no wheezing or rales Chest wall: without tenderness to palpitation Heart: PMI not displaced, RRR, s1 s2 normal, 1/6 systolic murmur, no diastolic murmur, no rubs, gallops, thrills, or heaves Abdomen: soft, nontender; no hepatosplenomehaly, BS+; abdominal aorta nontender and not dilated by palpation. Back: no CVA tenderness Pulses 2+ Musculoskeletal: full range of motion, normal strength, no joint deformities Extremities: no clubbing cyanosis or edema, Homan's sign negative  Neurologic: grossly nonfocal; Cranial nerves grossly  wnl Psychologic: Normal mood and affect    Studies/Labs Reviewed:   EKG:  EKG is ordered today. ECG (independently read by me): Atrial fibrillation at 65 bpm.  Inferolateral ST segment changes.  QS complex V1 through V3.  August 30, 2018 ECG (independently read by me): Atrial fibrillation with ventricular rate at 63 bpm.  Inferolateral ST changes.  LVH by voltage  May 2019 ECG (independently read by me): Atrial Fibrillation at 73 bpm.  LVH.  Nonspecific intraventricular conduction delay.  ST-T changes  February 2018 ECG (independently read by me): Atrial fibrillation at 69 bpm.  Incomplete left bundle blanch block.  ST-T changes.  December 2018 ECG (independently read by me):  Probable atrial fibrillation with ventricular rate in the 60s.  LVH with repolarization changes.  QTc interval 464 ms.  September 2018 ECG (independently read by me): Normal sinus rhythm at 64.  First AV block with a PR interval 204 ms.  LVH with repolarization changes.  QTc interval 470 ms.  Recent Labs: BMP Latest Ref Rng & Units 09/16/2018 08/30/2018 02/23/2018  Glucose 65 - 99 mg/dL 89 78 86  BUN 8 - 27 mg/dL '25 21 21  ' Creatinine 0.76 - 1.27 mg/dL 1.47(H) 1.37(H) 1.21  BUN/Creat Ratio 10 - '24 17 15 17  ' Sodium 134 - 144 mmol/L 140 145(H) 144  Potassium 3.5 - 5.2 mmol/L 4.6 4.0 3.8  Chloride 96 - 106 mmol/L 101 103 103  CO2 20 - 29 mmol/L '26 28 27  ' Calcium 8.6 - 10.2 mg/dL 10.4(H) 10.1 10.1     Hepatic Function Latest Ref Rng & Units 08/30/2018 12/16/2017 09/10/2017  Total Protein 6.0 - 8.5 g/dL 6.3 7.0 7.0  Albumin 3.5 - 4.8 g/dL 3.9 3.4(L) 4.0  AST 0 - 40 IU/L '18 23 25  ' ALT 0 - 44 IU/L '23 27 31  ' Alk Phosphatase 39 - 117 IU/L 81 92 93  Total Bilirubin 0.0 - 1.2 mg/dL 0.9 0.7 1.0    CBC Latest Ref  Rng & Units 12/16/2017 09/10/2017 04/13/2016  WBC 3.4 - 10.8 x10E3/uL 10.4 9.5 9.9  Hemoglobin 13.0 - 17.7 g/dL 14.1 14.3 12.7(L)  Hematocrit 37.5 - 51.0 % 41.2 40.9 38.1(L)  Platelets 150 - 379 x10E3/uL 223 192  247   Lab Results  Component Value Date   MCV 97 12/16/2017   MCV 97 09/10/2017   MCV 93.4 04/13/2016   Lab Results  Component Value Date   TSH 2.120 09/10/2017   Lab Results  Component Value Date   HGBA1C 5.4 03/04/2016     BNP    Component Value Date/Time   BNP 513.7 (H) 03/02/2016 1421    ProBNP    Component Value Date/Time   PROBNP 3,065 (H) 08/30/2018 1155     Lipid Panel     Component Value Date/Time   CHOL 124 09/10/2017 1202   TRIG 60 09/10/2017 1202   HDL 56 09/10/2017 1202   CHOLHDL 2.2 09/10/2017 1202   CHOLHDL 4.5 03/04/2016 0400   VLDL 13 03/04/2016 0400   LDLCALC 56 09/10/2017 1202     RADIOLOGY: No results found.   Additional studies/ records that were reviewed today include:  I reviewed his catheterization report,Echo Doppler data, hospital records, office visits with Dr. Nils Pyle and Tarri Fuller.  03/04/2016.  Cardiac catheterization Conclusion    RPDA lesion, 95% stenosed.  Prox RCA lesion, 80% stenosed.  Dist RCA lesion, 100% stenosed.  LM lesion, 50% stenosed.  Ost 1st Diag to 1st Diag lesion, 50% stenosed.  Prox LAD lesion, 75% stenosed.  1st Mrg lesion, 100% stenosed.  There is moderate left ventricular systolic dysfunction.   Severe multivessel CAD with coronary calcification and 50% stenosis in the proximal LAD and diagonal vessel followed by a napkin ring 75% focal stenosis between the first and second diagonal branch; 50% proximal circumflex stenosis with total occlusion of the OM1 vessel with retrograde collaterals arising from the LAD; and total occlusion of the mid RCA with 95% stenosis in the acute margin branch, and evidence for collaterals to the distal RCA via the distal circumflex and distal LAD.  Mild to moderate elevation of right heart pressures.  Moderately severe LV dysfunction with more pronounced inferior hypocontractility and EF of 35%  consistent with an ischemic  cardiomyopathy.  RECOMMENDATION: Surgical consultation for consideration of CABG revascularization surgery.      ------------------------------------------------------------------- 03/03/2016 ECHO Study Conclusions  - Left ventricle: The cavity size was normal. There was mild   concentric hypertrophy. Systolic function was moderately reduced.   The estimated ejection fraction was in the range of 35% to 40%.   Diffuse hypokinesis with akinesis of the basal and mid   inferolateral walls. Features are consistent with a pseudonormal   left ventricular filling pattern, with concomitant abnormal   relaxation and increased filling pressure (grade 2 diastolic   dysfunction). Doppler parameters are consistent with elevated   ventricular end-diastolic filling pressure. - Aortic valve: Structurally normal valve. There was mild   regurgitation. - Aorta: The aorta was normal, not dilated, and non-diseased. - Mitral valve: Structurally normal valve. There was moderate   regurgitation. - Left atrium: The atrium was mildly dilated. - Right ventricle: The cavity size was normal. Wall thickness was   normal. Systolic function was normal. - Right atrium: The atrium was normal in size. - Tricuspid valve: There was mild regurgitation. - Pulmonary arteries: Systolic pressure was mildly increased. PA   peak pressure: 38 mm Hg (S). - Pericardium, extracardiac: The pericardium was normal  in   appearance. There was a left pleural effusion.   ASSESSMENT:    1. Ischemic cardiomyopathy   2. S/P CABG x 4   3. Chronic combined systolic and diastolic heart failure (HCC)   4. Persistent atrial fibrillation   5. Essential hypertension   6. Hyperlipidemia with target LDL less than 70     PLAN:  [name] [name] is a 77 year old gentleman who is originally from [place].  He was found to have an ischemic cardiomyopathy with accelerated hypertension and multivessel CAD in March 2017.  He underwent  CABG  revascularization surgery 4.  He developed postoperative atrial fibrillation and also developed a subsequent left pleural effusion which ultimately required thoracentesis.  He underwent cardioversion for atrial fibrillation in May 2017.  Apparently, he had been on eliquis at that time, but developed significant nose bleeding and subsequently has not been on anticoagulation.  When I saw him for by initial evaluation in December following his hospitalization I discussed resumption of anticoagulation.  He was adamant that he would not start this.  As result, he has been on aspirin and Plavix.  He is aware of risk of thromboembolism function he today confirms that he is in atrial fibrillation.  He is aware that there is increased risk for thromboembolism if not on anticoagulation and he excepts this risk.  On subsequent echo he was found to have reduced LV function with an EF of 25 to 30%.  He has tolerated initiation and titration of Entresto and now is on maximal dosing at 97/103 mg twice a day in addition to spironolactone 12.5 mg daily, furosemide 40 mg, carvedilol 12.5 mg twice a day.  He continues to have atrial fibrillation and ventricular rate is controlled in the 60s.  I will now discontinue amiodarone altogether since there are no plans for cardioversion without anticoagulation.  I am recommending further titration of carvedilol to 18.75 mg in the morning and 12.5 mg at night.  He will take approximately 6 weeks for these amiodarone to be completely out of his system.  In 1 month he will undergo repeat echo Doppler assessment to see if there has been improvement in his LV function with current therapy.  I will see him in 6 weeks for reevaluation and at that time I anticipate carvedilol dose  titration will most likely be done.  He continues to be on atorvastatin 80 mg for hyperlipidemia.  LDL was 56 when last checked.  I will see him in 6 weeks for reevaluation.     Medication Adjustments/Labs and Tests  Ordered: Current medicines are reviewed at length with the patient today.  Concerns regarding medicines are outlined above.  Medication changes, Labs and Tests ordered today are listed in the Patient Instructions below. Patient Instructions  Medication Instructions:  STOP amiodarone INCREASE carvedilol (Coreg) to 18.75 mg in the AM and 12.5 mg in the PM  If you need a refill on your cardiac medications before your next appointment, please call your pharmacy.   Testing/Procedures: Your physician has requested that you have an echocardiogram in 1 MONTH. Echocardiography is a painless test that uses sound waves to create images of your heart. It provides your doctor with information about the size and shape of your heart and how well your heart's chambers and valves are working. This procedure takes approximately one hour. There are no restrictions for this procedure.  Follow-Up: At [facility], you and your health needs are our priority.  As part of our continuing mission  to provide you with exceptional heart care, we have created designated Provider Care Teams.  These Care Teams include your primary Cardiologist (physician) and Advanced Practice Providers (APPs -  Physician Assistants and Nurse Practitioners) who all work together to provide you with the care you need, when you need it. You will need a follow up appointment in 6 weeks (ok to use hold if possible).  Please call our office 2 months in advance to schedule this appointment.  You may see Shelva Majestic, MD or one of the following Advanced Practice Providers on your designated Care Team: [place], [place] . Fabian Sharp, PA-C       Time spent: 25 minutes Signed, Shelva Majestic, MD  10/10/2018 12:51 PM    [facility] [address] [place], [place], [place]  [id number] Phone: [phone]

## 2018-10-22 ENCOUNTER — Other Ambulatory Visit: Payer: Self-pay | Admitting: Cardiovascular Disease

## 2018-11-08 ENCOUNTER — Other Ambulatory Visit: Payer: Self-pay | Admitting: Cardiovascular Disease

## 2018-11-08 NOTE — Telephone Encounter (Signed)
Rx request sent to pharmacy.  

## 2018-11-14 ENCOUNTER — Other Ambulatory Visit: Payer: Self-pay

## 2018-11-14 ENCOUNTER — Ambulatory Visit (HOSPITAL_COMMUNITY): Payer: Medicare HMO | Attending: Cardiovascular Disease

## 2018-11-14 DIAGNOSIS — I255 Ischemic cardiomyopathy: Secondary | ICD-10-CM | POA: Insufficient documentation

## 2018-11-17 ENCOUNTER — Encounter (INDEPENDENT_AMBULATORY_CARE_PROVIDER_SITE_OTHER): Payer: Self-pay

## 2018-11-17 ENCOUNTER — Encounter: Payer: Self-pay | Admitting: Physician Assistant

## 2018-11-17 ENCOUNTER — Ambulatory Visit: Payer: Medicare HMO | Admitting: Physician Assistant

## 2018-11-17 VITALS — BP 131/93 | HR 78 | Ht 62.0 in | Wt 120.0 lb

## 2018-11-17 DIAGNOSIS — I480 Paroxysmal atrial fibrillation: Secondary | ICD-10-CM

## 2018-11-17 DIAGNOSIS — I5022 Chronic systolic (congestive) heart failure: Secondary | ICD-10-CM | POA: Diagnosis not present

## 2018-11-17 DIAGNOSIS — E785 Hyperlipidemia, unspecified: Secondary | ICD-10-CM

## 2018-11-17 DIAGNOSIS — I25810 Atherosclerosis of coronary artery bypass graft(s) without angina pectoris: Secondary | ICD-10-CM | POA: Diagnosis not present

## 2018-11-17 DIAGNOSIS — I10 Essential (primary) hypertension: Secondary | ICD-10-CM

## 2018-11-17 DIAGNOSIS — I340 Nonrheumatic mitral (valve) insufficiency: Secondary | ICD-10-CM | POA: Diagnosis not present

## 2018-11-17 MED ORDER — CARVEDILOL 25 MG PO TABS: 25.0000 mg | ORAL_TABLET | Freq: Two times a day (BID) | ORAL | 3 refills | Status: DC

## 2018-11-17 NOTE — Progress Notes (Signed)
Cardiology Office Note    Date:  11/19/2018   ID:  [name] [name], DOB [birth year], MRN [id number]  PCP:  Joycelyn Rua, MD  Cardiologist: Dr. Tresa Endo  Chief Complaint  Patient presents with  . Follow-up    seen for Dr. Tresa Endo.     History of Present Illness:  [name] [name] is a 77 y.o. male from [place] with past medical history of CAD, hypertension, and hyperlipidemia.  Patient initially presented with accelerated hypertension and the increasing shortness of breath in March 2017.  Echocardiogram at the time showed EF 35%.  Left and right heart cath performed on 03/04/2016 showed severe multivessel CAD with totally occluded mid RCA with distal left to right collateral.  He eventually underwent CABG x4 with LIMA to LAD, SVG to OM, SVG to PDA, SVG to diagonal by Dr. Zenaida Niece trigt.  He also underwent clipping of the left atrial appendage and the left carotid enterectomy as well.  He did developed postop atrial fibrillation and pleural effusion which required thoracentesis in the outpatient setting.  He later underwent outpatient cardioversion in May 2017.  Due to significant nosebleed, his Eliquis was discontinued.  Echocardiogram in October 2018 showed further depression of LV function with EF of 25 to 30%, diffuse hypokinesis and akinesis of the inferolateral wall, grade 3 DD, moderate MR, severe LAE, moderate RA dilatation and moderate aortic insufficiency, PA peak pressure was significant at 85 mmHg.  Labs showed normal thyroid function.  He was started on Entresto.  He has refused systemic anticoagulation therapy despite recurrent atrial fibrillation.  He presents to cardiology service for evaluation.  He has been doing well denying any lower extremity edema, orthopnea or PND on the current dose of Lasix.  He still have some Entresto only for this month, however he says he is no longer enrolled in the [employer] and unable to afford Entresto otherwise.  I will help him to fill out new medication  assistance form to be turned into the [employer].  Otherwise, I will need to discuss with Dr. Tresa Endo regarding his worsening mitral valve leakage.  He appears to be euvolemic on physical exam.  His blood pressure is borderline elevated, I will increase his carvedilol to 25 mg twice daily for his blood pressure in the better control his heart rate.   Past Medical History:  Diagnosis Date  . Atrial fibrillation (HCC) 03/21/2016    Past Surgical History:  Procedure Laterality Date  . CARDIAC CATHETERIZATION N/A 03/04/2016   Procedure: Right/Left Heart Cath and Coronary Angiography;  Surgeon: Lennette Bihari, MD;  Location: [facility] INVASIVE CV LAB;  Service: Cardiovascular;  Laterality: N/A;  . CARDIOVERSION N/A 04/16/2016   Procedure: CARDIOVERSION;  Surgeon: Jake Bathe, MD;  Location: [facility] ENDOSCOPY;  Service: Cardiovascular;  Laterality: N/A;  . CLIPPING OF ATRIAL APPENDAGE Left 03/10/2016   Procedure: CLIPPING OF ATRIAL APPENDAGE;  Surgeon: Kerin Perna, MD;  Location: [facility] OR;  Service: Open Heart Surgery;  Laterality: Left;  . CORONARY ARTERY BYPASS GRAFT N/A 03/10/2016   Procedure: CORONARY ARTERY BYPASS GRAFTING (CABG)TIMES 4 USING LEFT INTERNAL MAMMARY AND BILATERAL GREATER SAPHENOUS VEIN HARVESTED BY ENDOVIEN;  Surgeon: Kerin Perna, MD;  Location: [facility] OR;  Service: Open Heart Surgery;  Laterality: N/A;  . ENDARTERECTOMY Left 03/10/2016   Procedure: LEFT ENDARTERECTOMY CAROTID DACRON PATCH ANGIOPLASTY;  Surgeon: Sherren Kerns, MD;  Location: [facility] OR;  Service: Vascular;  Laterality: Left;  . TEE WITHOUT CARDIOVERSION N/A 03/10/2016   Procedure: TRANSESOPHAGEAL ECHOCARDIOGRAM (  TEE);  Surgeon: Kerin Perna, MD;  Location: [facility] OR;  Service: Open Heart Surgery;  Laterality: N/A;    Current Medications: Outpatient Medications Prior to Visit  Medication Sig Dispense Refill  . Ascorbic Acid (VITAMIN C) 1000 MG tablet Take 1,000 mg by mouth daily.    Marland Kitchen aspirin 81 MG tablet Take 81 mg by mouth  every Monday, Wednesday, and Friday.    Marland Kitchen atorvastatin (LIPITOR) 80 MG tablet Take 1 tablet (80 mg total) by mouth daily at 6 PM. 90 tablet 3  . clopidogrel (PLAVIX) 75 MG tablet TAKE 1 TABLET BY MOUTH EVERY DAY 90 tablet 3  . furosemide (LASIX) 40 MG tablet TAKE 1 TABLET BY MOUTH EVERY DAY 30 tablet 9  . KLOR-CON 10 10 MEQ tablet TAKE 1 TABLET BY MOUTH DAILY 30 tablet 5  . magnesium 30 MG tablet Take 30 mg by mouth daily.    . pantoprazole (PROTONIX) 40 MG tablet Take 1 tablet (40 mg total) by mouth daily. PLEASE MAKE APPOINTMENT FOR FURTHER REFILLS 90 tablet 0  . sacubitril-valsartan (ENTRESTO) 97-103 MG Take 1 tablet by mouth 2 (two) times daily. 180 tablet 3  . spironolactone (ALDACTONE) 25 MG tablet TAKE 1/2 TABLET BY MOUTH EVERY DAY 15 tablet 7  . carvedilol (COREG) 12.5 MG tablet Take 1.5 tablets (18.75 mg total) by mouth every morning AND 1 tablet (12.5 mg total) every evening. 225 tablet 3  . atorvastatin (LIPITOR) 80 MG tablet TAKE 1 TABLET BY MOUTH DAILY AT 6PM 30 tablet 5  . KLOR-CON 10 10 MEQ tablet TAKE 1 TABLET BY MOUTH EVERY DAY 90 tablet 2   No facility-administered medications prior to visit.      Allergies:   Patient has no known allergies.   Social History   Socioeconomic History  . Marital status: Married    Spouse name: Not on file  . Number of children: Not on file  . Years of education: Not on file  . Highest education level: Not on file  Occupational History  . Not on file  Social Needs  . Financial resource strain: Not on file  . Food insecurity:    Worry: Not on file    Inability: Not on file  . Transportation needs:    Medical: Not on file    Non-medical: Not on file  Tobacco Use  . Smoking status: Former Games developer  . Smokeless tobacco: Former Engineer, water and Sexual Activity  . Alcohol use: Not on file  . Drug use: Not on file  . Sexual activity: Not on file  Lifestyle  . Physical activity:    Days per week: Not on file    Minutes per  session: Not on file  . Stress: Not on file  Relationships  . Social connections:    Talks on phone: Not on file    Gets together: Not on file    Attends religious service: Not on file    Active member of club or organization: Not on file    Attends meetings of clubs or organizations: Not on file    Relationship status: Not on file  Other Topics Concern  . Not on file  Social History Narrative  . Not on file     Family History:  The patient's family history is not on file.   ROS:   Please see the history of present illness.    ROS All other systems reviewed and are negative.   PHYSICAL EXAM:   VS:  BP (!) 131/93   Pulse 78   Ht 5\' 2"  (1.575 m)   Wt 120 lb (54.4 kg)   BMI 21.95 kg/m    GEN: Well nourished, well developed, in no acute distress  HEENT: normal  Neck: no JVD, carotid bruits, or masses Cardiac: RRR; no murmurs, rubs, or gallops,no edema  Respiratory:  clear to auscultation bilaterally, normal work of breathing GI: soft, nontender, nondistended, + BS MS: no deformity or atrophy  Skin: warm and dry, no rash Neuro:  Alert and Oriented x 3, Strength and sensation are intact Psych: euthymic mood, full affect  Wt Readings from Last 3 Encounters:  11/17/18 120 lb (54.4 kg)  10/10/18 116 lb (52.6 kg)  08/30/18 120 lb 12.8 oz (54.8 kg)      Studies/Labs Reviewed:   EKG:  EKG is not ordered today.    Recent Labs: 12/16/2017: Hemoglobin 14.1; Platelets 223 08/30/2018: ALT 23; NT-Pro BNP 3,065 09/16/2018: BUN 25; Creatinine, Ser 1.47; Potassium 4.6; Sodium 140   Lipid Panel    Component Value Date/Time   CHOL 124 09/10/2017 1202   TRIG 60 09/10/2017 1202   HDL 56 09/10/2017 1202   CHOLHDL 2.2 09/10/2017 1202   CHOLHDL 4.5 03/04/2016 0400   VLDL 13 03/04/2016 0400   LDLCALC 56 09/10/2017 1202    Additional studies/ records that were reviewed today include:   Echo 11/14/2018 ------------------------------------------------------------------- LV EF: 30%  -   35% Study Conclusions  - Left ventricle: The cavity size was normal. Wall thickness was   normal. Basal to mid anterolateral akinesis. Basal to mid   inferolateral akinesis. Basal inferior akinesis. Systolic   function was moderately to severely reduced. The estimated   ejection fraction was in the range of 30% to 35%. The study was   not technically sufficient to allow evaluation of LV diastolic   dysfunction due to atrial fibrillation. - Aortic valve: Trileaflet; mildly calcified leaflets. There was no   stenosis. There was mild regurgitation. - Mitral valve: Mildly calcified annulus. Mildly calcified leaflets   . Moderate to severe mitral regurgitation with restriction of   posterior leaflet, likely infarct-related MR. - Left atrium: The atrium was moderately to severely dilated. - Right ventricle: The cavity size was normal. Systolic function   was normal. - Right atrium: The atrium was mildly to moderately dilated. - Tricuspid valve: Peak RV-RA gradient (S): 44 mm Hg. - Pulmonary arteries: PA peak pressure: 47 mm Hg (S). - Inferior vena cava: The vessel was normal in size. The   respirophasic diameter changes were in the normal range (>= 50%),   consistent with normal central venous pressure.  Impressions:  - The patient was in atrial fibrillation. Normal LV size with wall   motion abnormalities as noted above. EF 30-35%. Normal RV size   and systolic function. Mild aortic insufficiency. Moderate-severe   MR, suspect infarct-related MR. Mild-moderate pulmonary   hypertension.    ASSESSMENT:    1. Chronic systolic heart failure (HCC)   2. Coronary artery disease involving coronary bypass graft of native heart without angina pectoris   3. Essential hypertension   4. Hyperlipidemia, unspecified hyperlipidemia type   5. Nonrheumatic mitral valve regurgitation   6. PAF (paroxysmal atrial fibrillation) (HCC)      PLAN:  In order of problems listed  above:  1. Chronic systolic heart failure: Appears to be euvolemic on the current diuretic  2. Possible paroxysmal atrial fibrillation: There was no mention of previous atrial fibrillation episodes, however  recent echocardiogram noted patient was in atrial fibrillation.    3. CAD s/p CABG: Underwent CABG in 2017.  Currently on aspirin Plavix.  If the patient indeed have paroxysmal atrial fibrillation, I plan to discontinue at least Plavix and the change to Eliquis.  4. mitral valve regurgitation: Appears to have been worsened on recent echocardiogram as well.  I will discuss this case with Dr. Tresa Endo.  5. Hypertension: Increase carvedilol to 25 mg twice daily.  6. Hyperlipidemia: Stable on current Lipitor.    Medication Adjustments/Labs and Tests Ordered: Current medicines are reviewed at length with the patient today.  Concerns regarding medicines are outlined above.  Medication changes, Labs and Tests ordered today are listed in the Patient Instructions below. Patient Instructions  Medication Instructions:  Increase Carvedilol to 25 mg twice a day If you need a refill on your cardiac medications before your next appointment, please call your pharmacy.   Lab work: None ordered   Testing/Procedures: None ordered  Follow-Up: At [facility], you and your health needs are our priority.  As part of our continuing mission to provide you with exceptional heart care, we have created designated Provider Care Teams.  These Care Teams include your primary Cardiologist (physician) and Advanced Practice Providers (APPs -  Physician Assistants and Nurse Practitioners) who all work together to provide you with the care you need, when you need it. Marland Kitchen     Appointment scheduled with Dr.Kelly Wed 02/15/19 at 2:00 pm     Signed, Azalee Course, [place]  11/19/2018 11:51 PM    [facility] [address] [place], [place], [place]  [address] Phone: [phone]; Fax: [phone]

## 2018-11-17 NOTE — Patient Instructions (Addendum)
Medication Instructions:  Increase Carvedilol to 25 mg twice a day If you need a refill on your cardiac medications before your next appointment, please call your pharmacy.   Lab work: None ordered   Testing/Procedures: None ordered  Follow-Up: At [facility], you and your health needs are our priority.  As part of our continuing mission to provide you with exceptional heart care, we have created designated Provider Care Teams.  These Care Teams include your primary Cardiologist (physician) and Advanced Practice Providers (APPs -  Physician Assistants and Nurse Practitioners) who all work together to provide you with the care you need, when you need it. Marland Kitchen.     Appointment scheduled with Dr.Kelly Wed 02/15/19 at 2:00 pm

## 2018-11-19 ENCOUNTER — Encounter: Payer: Self-pay | Admitting: Physician Assistant

## 2018-12-21 ENCOUNTER — Other Ambulatory Visit: Payer: Self-pay | Admitting: Cardiovascular Disease

## 2019-01-05 DIAGNOSIS — K219 Gastro-esophageal reflux disease without esophagitis: Secondary | ICD-10-CM | POA: Diagnosis not present

## 2019-01-05 DIAGNOSIS — E7800 Pure hypercholesterolemia, unspecified: Secondary | ICD-10-CM | POA: Diagnosis not present

## 2019-01-05 DIAGNOSIS — I5042 Chronic combined systolic (congestive) and diastolic (congestive) heart failure: Secondary | ICD-10-CM | POA: Diagnosis not present

## 2019-01-05 DIAGNOSIS — I10 Essential (primary) hypertension: Secondary | ICD-10-CM | POA: Diagnosis not present

## 2019-01-05 DIAGNOSIS — Z0000 Encounter for general adult medical examination without abnormal findings: Secondary | ICD-10-CM | POA: Diagnosis not present

## 2019-01-05 DIAGNOSIS — I480 Paroxysmal atrial fibrillation: Secondary | ICD-10-CM | POA: Diagnosis not present

## 2019-01-05 DIAGNOSIS — I2510 Atherosclerotic heart disease of native coronary artery without angina pectoris: Secondary | ICD-10-CM | POA: Diagnosis not present

## 2019-02-15 ENCOUNTER — Ambulatory Visit: Payer: Medicare HMO | Admitting: Cardiovascular Disease

## 2019-02-15 ENCOUNTER — Encounter: Payer: Self-pay | Admitting: Cardiovascular Disease

## 2019-02-15 VITALS — BP 122/84 | HR 66 | Ht 62.0 in | Wt 126.4 lb

## 2019-02-15 DIAGNOSIS — I255 Ischemic cardiomyopathy: Secondary | ICD-10-CM | POA: Diagnosis not present

## 2019-02-15 DIAGNOSIS — I25810 Atherosclerosis of coronary artery bypass graft(s) without angina pectoris: Secondary | ICD-10-CM

## 2019-02-15 DIAGNOSIS — I340 Nonrheumatic mitral (valve) insufficiency: Secondary | ICD-10-CM | POA: Diagnosis not present

## 2019-02-15 DIAGNOSIS — E785 Hyperlipidemia, unspecified: Secondary | ICD-10-CM

## 2019-02-15 DIAGNOSIS — Z951 Presence of aortocoronary bypass graft: Secondary | ICD-10-CM | POA: Diagnosis not present

## 2019-02-15 DIAGNOSIS — I4819 Other persistent atrial fibrillation: Secondary | ICD-10-CM | POA: Diagnosis not present

## 2019-02-15 NOTE — Progress Notes (Signed)
Cardiology Office Note    Date:  02/16/2019   ID:  [name] [name], DOB [birth year], MRN [id number]  PCP:  [name] Melter, MD  Cardiologist:  [name] Majestic, MD   F/U office evaluation.  Initial office evaluation 11/19/2017  History of Present Illness:  [name] [name] is a 78 y.o. male  who presents for a 5 month follow-up cardiology evaluation .  [name] [name] is originally from [place].  In March 2017 he presented with accelerated hypertension and increasing shortness of breath.  An echo Doppler study showed an EF of 35%.  He was referred for right and left heart catheterization which was done by me on 03/04/2016.  This revealed severe multivessel CAD with coronary calcification with 50% stenosis in the proximal LAD and diagonal, followed by a napkin ring 75% focal stenosis between the first and second diagonal branch.  There was 50% proximal circumflex stenosis with total occlusion of the OM1 vessel with retrograde collaterals arising from the LAD.  The RCA was totally occluded in its mid segment and there was a 95% stenosis in the acute marginal branch.  There was collaterals to the distal RCA via the distal circumflex and distal LAD.  He did, mild to moderate elevation of right heart pressures.  EF was 35%, consistent with ischemic heart myopathy.  He underwent CABGx4 revascularization surgery by Dr. Tharon Aquas [name] with a LIMA to LAD, SVG to OM, SVG to PDA, and SVG to diagonal.  He also underwent clipping of the left atrial appendage and left carotid endarterectomy.  He had developed atrial fibrillation postoperatively.  In addition, he developed a pleural effusion which required thoracentesis by Dr. Nils [name] in the outpatient setting.  He saw [name] [name] for initial outpatient follow-up in April 2017.  On 04/16/2016.  He underwent cardioversion with restoration of sinus rhythm.  He has not been seen by cardiology since.  I had not seen him since his original hospitalization until he presented to my  office in September 2018.  At that time, he denied any recurrent anginal symptoms or significant dyspnea or palpitations.  He was on amiodarone 200 mg daily, carvedilol 3.125 mg twice a day and was unaware of recurrent atrial fibrillation.  He also has been taking Lasix 40 mg daily, Protonix for GERD and atorvastatin 80 mg for hyperlipidemia.    When I initially saw him, his physical examination suggested a significant murmur of mitral regurgitation.  I scheduled him for 2-D echo Doppler study, and also discussed potential future ARNI therapy.  An echo Doppler study on 09/17/2017 showed further depression of LV function with an EF of 25-30% with diffuse hypocontractility and akinesis of the inferolateral wall.  He had grade 3 diastolic dysfunction.  There was moderate mitral regurgitation, severe LA dilation, moderate RA dilation, and moderate aortic insufficiency.  PA pressure was significant increased at 85 mm.  Laboratory and show normal thyroid function.  He was not anemic.  He had stable renal function and LFTs.  Lipid studies revealed a total cholesterol 124, triglycerides 60, HDL 56, and LDL 56.   When I saw him on 11/19/2017, I discontinued valsartan and started him on Entresto 24/26 twice a day.  I also discussed reinstitution with anticoagulation which he was adamant that he would not do.  As result, I added Plavix 75 mg daily in addition to his aspirin 81 mg.  We also discussed potential need for future ICD implantation.  Over the past 2 months, his breathing has improved.  He  still notes some mild ankle swelling bilaterally.  He denies palpitations.  Follow-up laboratory in 12/16/2017 showed a hemoglobin of 14.1, hematocrit 41.2.  ProBNP was elevated at 3754.  Creatinine was stable at 1.26.  He was seen by [name] [name] in our office on 12/16/2017.  Carvedilol was increased to 6.25 mg twice a day.  She is working on the [employer] patient assistance program.   In February 2019, I titrated Entresto to  49/51 mg twice a day.  Repeat blood work March 2019 showed a creatinine that was stable at 1.21.  ProBNP was still elevated but improved at 2643.  At that time he continued to experience mild peripheral edema.  When I last saw him in May 2019 he was feeling significantly improved on the increase Entresto regimen.  He has been in atrial fibrillation and during that evaluation I again had a thorough discussion with him regarding increased risk for thromboembolism without anticoagulation therapy and he again reiterated that he had no intention to initiate anticoagulation.  During his evaluation I titrated Entresto to optimal dosing at 97/103 mg twice a day.  I recommended he continue his current dose of furosemide at 40 mg.  Over the past several months, he has continued to feel well.  He feels that [name] [name] has made a significant improvement in his sense of well-being.   I last saw him on August 30, 2018.  At that time I again had further discussion regarding his anticoagulation and he remained steadfast in his decision not to initiate and was aware of potential thromboembolic risk.  At that time I recommended he reduce amiodarone to 100 mg daily and discussed ultimate plans to discontinue altogether and further titrate carvedilol.   I last saw him on October 10, 2018.  He continues to have atrial fibrillation with ventricular rate controlled in the 60s.  At that time I discontinued amiodarone since there were no plans for cardioversion without anticoagulation.  Recommended further titration of carvedilol to 18.7 5 in the morning and 12.5 mg at night.  I scheduled him for a follow-up echo Doppler study which was done on November 14, 2018.  This showed an EF of 30 to 35%.  There was mitral annular calcification.  There was now moderately severe MR and moderately severe LA dilation.  PA pressure was increased at 47 mm.  [name] [name] last saw [name] [name], [place] on November 17, 2018.  Over the last several months, he has  continued to feel well.  However, he is concerned about the cost of his Entresto.  He has been titrated up to maximum 97/103 mg twice daily.  He is asymptomatic with reference to shortness of breath.  He denies palpitations.  He presents for evaluation  Past Medical History:  Diagnosis Date  . Atrial fibrillation ([place]) 03/21/2016    Past Surgical History:  Procedure Laterality Date  . CARDIAC CATHETERIZATION N/A 03/04/2016   Procedure: Right/Left Heart Cath and Coronary Angiography;  Surgeon: Troy Sine, MD;  Location: [facility];  Service: Cardiovascular;  Laterality: N/A;  . CARDIOVERSION N/A 04/16/2016   Procedure: CARDIOVERSION;  Surgeon: Jerline Pain, MD;  Location: [facility] ENDOS[facility];  Service: Cardiovascular;  Laterality: N/A;  . CLIPPING OF ATRIAL APPENDAGE Left 03/10/2016   Procedure: CLIPPING OF ATRIAL APPENDAGE;  Surgeon: Ivin Poot, MD;  Location: [place];  Service: Open Heart Surgery;  Laterality: Left;  . CORONARY ARTERY BYPASS GRAFT N/A 03/10/2016   Procedure: CORONARY ARTERY BYPASS GRAFTING (CABG)TIMES 4  USING LEFT INTERNAL MAMMARY AND BILATERAL GREATER SAPHENOUS VEIN HARVESTED BY ENDOVIEN;  Surgeon: Ivin Poot, MD;  Location: [place];  Service: Open Heart Surgery;  Laterality: N/A;  . ENDARTERECTOMY Left 03/10/2016   Procedure: LEFT ENDARTERECTOMY CAROTID DACRON PATCH ANGIOPLASTY;  Surgeon: Elam Dutch, MD;  Location: [place];  Service: Vascular;  Laterality: Left;  . TEE WITHOUT CARDIOVERSION N/A 03/10/2016   Procedure: TRANSESOPHAGEAL ECHOCARDIOGRAM (TEE);  Surgeon: Ivin Poot, MD;  Location: [facility];  Service: Open Heart Surgery;  Laterality: N/A;    Current Medications: Outpatient Medications Prior to Visit  Medication Sig Dispense Refill  . Ascorbic Acid (VITAMIN C) 1000 MG tablet Take 1,000 mg by mouth daily.    Marland Kitchen aspirin 81 MG tablet Take 81 mg by mouth every Monday, Wednesday, and Friday.    Marland Kitchen atorvastatin (LIPITOR) 80 MG tablet Take 1 tablet (80 mg  total) by mouth daily at 6 PM. 90 tablet 3  . carvedilol (COREG) 25 MG tablet Take 1 tablet (25 mg total) by mouth 2 (two) times daily. 180 tablet 3  . clopidogrel (PLAVIX) 75 MG tablet TAKE 1 TABLET BY MOUTH EVERY DAY 90 tablet 3  . furosemide (LASIX) 40 MG tablet TAKE 1 TABLET BY MOUTH EVERY DAY 30 tablet 9  . KLOR-CON 10 10 MEQ tablet TAKE 1 TABLET BY MOUTH DAILY 30 tablet 5  . magnesium 30 MG tablet Take 30 mg by mouth daily.    . pantoprazole (PROTONIX) 40 MG tablet Take 1 tablet (40 mg total) by mouth daily. PLEASE MAKE APPOINTMENT FOR FURTHER REFILLS 90 tablet 0  . sacubitril-valsartan (ENTRESTO) 97-103 MG Take 1 tablet by mouth 2 (two) times daily. 180 tablet 3  . spironolactone (ALDACTONE) 25 MG tablet TAKE 1/2 TABLET BY MOUTH EVERY DAY 15 tablet 7   No facility-administered medications prior to visit.      Allergies:   Patient has no known allergies.   Social History   Socioeconomic History  . Marital status: Married    Spouse name: Not on file  . Number of children: Not on file  . Years of education: Not on file  . Highest education level: Not on file  Occupational History  . Not on file  Social Needs  . Financial resource strain: Not on file  . Food insecurity:    Worry: Not on file    Inability: Not on file  . Transportation needs:    Medical: Not on file    Non-medical: Not on file  Tobacco Use  . Smoking status: Former Research scientist (life sciences)  . Smokeless tobacco: Former Network engineer and Sexual Activity  . Alcohol use: Not on file  . Drug use: Not on file  . Sexual activity: Not on file  Lifestyle  . Physical activity:    Days per week: Not on file    Minutes per session: Not on file  . Stress: Not on file  Relationships  . Social connections:    Talks on phone: Not on file    Gets together: Not on file    Attends religious service: Not on file    Active member of club or organization: Not on file    Attends meetings of clubs or organizations: Not on file     Relationship status: Not on file  Other Topics Concern  . Not on file  Social History Narrative  . Not on file     Family History:  The patient's family history is not on file.  His parents are deceased.  He came to [place] in the 1960s from [place].  ROS General: Negative; No fevers, chills, or night sweats;  HEENT: Negative; No changes in vision or hearing, sinus congestion, difficulty swallowing Pulmonary: Negative; No cough, wheezing, shortness of breath, hemoptysis Cardiovascular: See history of present illness GI: Negative; No nausea, vomiting, diarrhea, or abdominal pain GU: Negative; No dysuria, hematuria, or difficulty voiding Musculoskeletal: Negative; no myalgias, joint pain, or weakness Hematologic/Oncology: Negative; no easy bruising, bleeding Endocrine: Negative; no heat/cold intolerance; no diabetes Neuro: Negative; no changes in balance, headaches Skin: Negative; No rashes or skin lesions Psychiatric: Negative; No behavioral problems, depression Sleep: Negative; No snoring, daytime sleepiness, hypersomnolence, bruxism, restless legs, hypnogognic hallucinations, no cataplexy Other comprehensive 14 point system review is negative.   PHYSICAL EXAM:   VS:  BP 122/84   Pulse 66   Ht '5\' 2"'  (1.575 m)   Wt 126 lb 6.4 oz (57.3 kg)   BMI 23.12 kg/m     Repeat blood pressure today was 112/80  Wt Readings from Last 3 Encounters:  02/15/19 126 lb 6.4 oz (57.3 kg)  11/17/18 120 lb (54.4 kg)  10/10/18 116 lb (52.6 kg)    General: Alert, oriented, no distress.  Skin: normal turgor, no rashes, warm and dry HEENT: Normocephalic, atraumatic. Pupils equal round and reactive to light; sclera anicteric; extraocular muscles intact;  Nose without nasal septal hypertrophy Mouth/Parynx benign; Mallinpatti scale 3 Neck: No JVD, no carotid bruits; normal carotid upstroke Lungs: clear to ausculatation and percussion; no wheezing or rales Chest wall: without tenderness to  palpitation Heart: PMI not displaced, irregularly irregular with a controlled ventricular rate in the 60s, s1 s2 normal, 5-2/8 systolic murmur, no diastolic murmur, no rubs, gallops, thrills, or heaves Abdomen: soft, nontender; no hepatosplenomehaly, BS+; abdominal aorta nontender and not dilated by palpation. Back: no CVA tenderness Pulses 2+ Musculoskeletal: full range of motion, normal strength, no joint deformities Extremities: no clubbing cyanosis or edema, Homan's sign negative  Neurologic: grossly nonfocal; Cranial nerves grossly wnl Psychologic: Normal mood and affect    Studies/Labs Reviewed:   EKG:  EKG is ordered today.  ECG (independently read by me): Atrial fibrillation at 66 bpm.  Poor anterior R wave progression.  Nonspecific T wave abnormality.  Normal QTc interval at 442 ms.  October 2019 ECG (independently read by me): Atrial fibrillation at 65 bpm.  Inferolateral ST segment changes.  QS complex V1 through V3.  August 30, 2018 ECG (independently read by me): Atrial fibrillation with ventricular rate at 63 bpm.  Inferolateral ST changes.  LVH by voltage  May 2019 ECG (independently read by me): Atrial Fibrillation at 73 bpm.  LVH.  Nonspecific intraventricular conduction delay.  ST-T changes  February 2018 ECG (independently read by me): Atrial fibrillation at 69 bpm.  Incomplete left bundle blanch block.  ST-T changes.  December 2018 ECG (independently read by me):  Probable atrial fibrillation with ventricular rate in the 60s.  LVH with repolarization changes.  QTc interval 464 ms.  September 2018 ECG (independently read by me): Normal sinus rhythm at 64.  First AV block with a PR interval 204 ms.  LVH with repolarization changes.  QTc interval 470 ms.  Recent Labs: BMP Latest Ref Rng & Units 09/16/2018 08/30/2018 02/23/2018  Glucose 65 - 99 mg/dL 89 78 86  BUN 8 - 27 mg/dL '25 21 21  ' Creatinine 0.76 - 1.27 mg/dL 1.47(H) 1.37(H) 1.21  BUN/Creat Ratio 10 - 24 17 15  17  Sodium 134 - 144 mmol/L 140 145(H) 144  Potassium 3.5 - 5.2 mmol/L 4.6 4.0 3.8  Chloride 96 - 106 mmol/L 101 103 103  CO2 20 - 29 mmol/L '26 28 27  ' Calcium 8.6 - 10.2 mg/dL 10.4(H) 10.1 10.1     Hepatic Function Latest Ref Rng & Units 08/30/2018 12/16/2017 09/10/2017  Total Protein 6.0 - 8.5 g/dL 6.3 7.0 7.0  Albumin 3.5 - 4.8 g/dL 3.9 3.4(L) 4.0  AST 0 - 40 IU/L '18 23 25  ' ALT 0 - 44 IU/L '23 27 31  ' Alk Phosphatase 39 - 117 IU/L 81 92 93  Total Bilirubin 0.0 - 1.2 mg/dL 0.9 0.7 1.0    CBC Latest Ref Rng & Units 12/16/2017 09/10/2017 04/13/2016  WBC 3.4 - 10.8 x10E3/uL 10.4 9.5 9.9  Hemoglobin 13.0 - 17.7 g/dL 14.1 14.3 12.7(L)  Hematocrit 37.5 - 51.0 % 41.2 40.9 38.1(L)  Platelets 150 - 379 x10E3/uL 223 192 247   Lab Results  Component Value Date   MCV 97 12/16/2017   MCV 97 09/10/2017   MCV 93.4 04/13/2016   Lab Results  Component Value Date   TSH 2.120 09/10/2017   Lab Results  Component Value Date   HGBA1C 5.4 03/04/2016     BNP    Component Value Date/Time   BNP 513.7 (H) 03/02/2016 1421    ProBNP    Component Value Date/Time   PROBNP 3,065 (H) 08/30/2018 1155     Lipid Panel     Component Value Date/Time   CHOL 124 09/10/2017 1202   TRIG 60 09/10/2017 1202   HDL 56 09/10/2017 1202   CHOLHDL 2.2 09/10/2017 1202   CHOLHDL 4.5 03/04/2016 0400   VLDL 13 03/04/2016 0400   LDLCALC 56 09/10/2017 1202     RADIOLOGY: No results found.   Additional studies/ records that were reviewed today include:  I reviewed his catheterization report,Echo Doppler data, hospital records, office visits with Dr. Nils [name] and Tarri Fuller.  03/04/2016.  Cardiac catheterization Conclusion    RPDA lesion, 95% stenosed.  Prox RCA lesion, 80% stenosed.  Dist RCA lesion, 100% stenosed.  LM lesion, 50% stenosed.  Ost 1st Diag to 1st Diag lesion, 50% stenosed.  Prox LAD lesion, 75% stenosed.  1st Mrg lesion, 100% stenosed.  There is moderate left ventricular  systolic dysfunction.   Severe multivessel CAD with coronary calcification and 50% stenosis in the proximal LAD and diagonal vessel followed by a napkin ring 75% focal stenosis between the first and second diagonal branch; 50% proximal circumflex stenosis with total occlusion of the OM1 vessel with retrograde collaterals arising from the LAD; and total occlusion of the mid RCA with 95% stenosis in the acute margin branch, and evidence for collaterals to the distal RCA via the distal circumflex and distal LAD.  Mild to moderate elevation of right heart pressures.  Moderately severe LV dysfunction with more pronounced inferior hypocontractility and EF of 35%  consistent with an ischemic cardiomyopathy.  RECOMMENDATION: Surgical consultation for consideration of CABG revascularization surgery.      ------------------------------------------------------------------- 03/03/2016 ECHO Study Conclusions  - Left ventricle: The cavity size was normal. There was mild   concentric hypertrophy. Systolic function was moderately reduced.   The estimated ejection fraction was in the range of 35% to 40%.   Diffuse hypokinesis with akinesis of the basal and mid   inferolateral walls. Features are consistent with a pseudonormal   left ventricular filling pattern, with concomitant abnormal   relaxation  and increased filling pressure (grade 2 diastolic   dysfunction). Doppler parameters are consistent with elevated   ventricular end-diastolic filling pressure. - Aortic valve: Structurally normal valve. There was mild   regurgitation. - Aorta: The aorta was normal, not dilated, and non-diseased. - Mitral valve: Structurally normal valve. There was moderate   regurgitation. - Left atrium: The atrium was mildly dilated. - Right ventricle: The cavity size was normal. Wall thickness was   normal. Systolic function was normal. - Right atrium: The atrium was normal in size. - Tricuspid valve: There was  mild regurgitation. - Pulmonary arteries: Systolic pressure was mildly increased. PA   peak pressure: 38 mm Hg (S). - Pericardium, extracardiac: The pericardium was normal in   appearance. There was a left pleural effusion.   ASSESSMENT:    1. Nonrheumatic mitral valve regurgitation   2. Coronary artery disease involving coronary bypass graft of native heart without angina pectoris   3. Ischemic cardiomyopathy   4. S/P CABG x 4   5. Persistent atrial fibrillation   6. Hyperlipidemia LDL goal <70     PLAN:  [name] [name] is a 78 year old gentleman who is originally from [place].  He was found to have an ischemic cardiomyopathy with accelerated hypertension and multivessel CAD in March 2017.  He underwent  CABG revascularization surgery 4.  He developed postoperative atrial fibrillation and also developed a subsequent left pleural effusion which ultimately required thoracentesis.  He underwent cardioversion for atrial fibrillation in May 2017.  Apparently, he had been on eliquis at that time, but developed significant nose bleeding and subsequently has not been on anticoagulation.  When I saw him for my initial evaluation in December following his hospitalization I discussed resumption of anticoagulation.  He was adamant that he would not start this.  As result, he has been on aspirin and Plavix.  He is aware of risk of thromboembolism function and continues to have permanent atrial fibrillation.  He is now off amiodarone and just on rate control with carvedilol which now has been titrated to 25 mg twice a day.  He has done well and is euvolemic with Entresto 97/103 mg twice a day and continues to take spironolactone 12.5 mg daily.  He was concerned that he could no longer take this due to cost and I had discussed this with Joslyn Hy, [name] who went in to talk with him regarding the re-approval process for the [employer].  We also discussed his most recent echo showing progressive MR which  most likely is infarct related.  We discussed undergoing a TEE with consideration for possible mitral valve clipping.  Presently he will contemplate and I will schedule him for a follow-up echo Doppler study in May to see if there has been any significant interval change in LV function or MR severity.  I will see him back in May in follow-up of the above studies and further recommendations will be made at that time.  He continues to be on atorvastatin for hyperlipidemia with target LDL less than 70.    Medication Adjustments/Labs and Tests Ordered: Current medicines are reviewed at length with the patient today.  Concerns regarding medicines are outlined above.  Medication changes, Labs and Tests ordered today are listed in the Patient Instructions below. Patient Instructions  Medication Instructions:  The current medical regimen is effective;  continue present plan and medications.  If you need a refill on your cardiac medications before your next appointment, please call your pharmacy.  Testing/Procedures: Echocardiogram (in May) - Your physician has requested that you have an echocardiogram. Echocardiography is a painless test that uses sound waves to create images of your heart. It provides your doctor with information about the size and shape of your heart and how well your heart's chambers and valves are working. This procedure takes approximately one hour. There are no restrictions for this procedure. This will be performed at our [facility] location - [address].   Follow-Up: At [facility], you and your health needs are our priority.  As part of our continuing mission to provide you with exceptional heart care, we have created designated Provider Care Teams.  These Care Teams include your primary Cardiologist (physician) and Advanced Practice Providers (APPs -  Physician Assistants and Nurse Practitioners) who all work together to provide you with the care you need,  when you need it. You will need a follow up appointment in 2 months after ECHO. You may see [name] Majestic, MD or one of the following Advanced Practice Providers on your designated Care Team: [place], [place] . Fabian Sharp, PA-C      Time spent: 25 minutes Signed, [name] Majestic, MD  02/16/2019 4:06 PM    [facility] [address], [place], [place], [place]  [address] Phone: [phone]

## 2019-02-15 NOTE — Patient Instructions (Signed)
Medication Instructions:  The current medical regimen is effective;  continue present plan and medications.  If you need a refill on your cardiac medications before your next appointment, please call your pharmacy.    Testing/Procedures: Echocardiogram (in May) - Your physician has requested that you have an echocardiogram. Echocardiography is a painless test that uses sound waves to create images of your heart. It provides your doctor with information about the size and shape of your heart and how well your heart's chambers and valves are working. This procedure takes approximately one hour. There are no restrictions for this procedure. This will be performed at our [facility] location - [address], [address].   Follow-Up: At [facility], you and your health needs are our priority.  As part of our continuing mission to provide you with exceptional heart care, we have created designated Provider Care Teams.  These Care Teams include your primary Cardiologist (physician) and Advanced Practice Providers (APPs -  Physician Assistants and Nurse Practitioners) who all work together to provide you with the care you need, when you need it. You will need a follow up appointment in 2 months after ECHO. You may see Nicki Guadalajara, MD or one of the following Advanced Practice Providers on your designated Care Team: [place], [place] . Micah Flesher, PA-C

## 2019-02-16 ENCOUNTER — Telehealth: Payer: Self-pay | Admitting: Cardiovascular Disease

## 2019-02-16 ENCOUNTER — Encounter: Payer: Self-pay | Admitting: Cardiovascular Disease

## 2019-02-16 NOTE — Telephone Encounter (Signed)
Patient called from appointment yesterday. He states the number you gave him did not help him with medication. Can you please advise, I am not sure what exactly he is referring too.

## 2019-02-16 NOTE — Telephone Encounter (Signed)
He cannot afford his Entresto in 2020.  I am not sure what the criteria are for the [facility] that I suggested he call yesterday.  Apparently he doesn't qualify.  He will need to call [employer] to determine if his cost is due to a high front end deductible or if it will be expensive all year.  If it will always be cost prohibitive, TK will need to switch him to valsartan

## 2019-02-16 NOTE — Telephone Encounter (Signed)
New Message:      Pt saw Dr Tresa Endo yesterday, says he needs to talk to you please

## 2019-02-16 NOTE — Telephone Encounter (Signed)
Called patient, advised to contact [employer] to discuss. Patient verbalized understanding, advised to call back if issues continue and we would have to switch him. Patient verbalized understanding.

## 2019-03-05 ENCOUNTER — Other Ambulatory Visit: Payer: Self-pay | Admitting: Cardiovascular Disease

## 2019-04-05 ENCOUNTER — Other Ambulatory Visit: Payer: Self-pay | Admitting: Cardiovascular Disease

## 2019-04-17 ENCOUNTER — Other Ambulatory Visit (HOSPITAL_COMMUNITY): Payer: Medicare HMO

## 2019-04-25 ENCOUNTER — Other Ambulatory Visit: Payer: Self-pay | Admitting: Cardiovascular Disease

## 2019-05-01 ENCOUNTER — Ambulatory Visit: Payer: Medicare HMO | Admitting: Cardiovascular Disease

## 2019-05-31 ENCOUNTER — Encounter (HOSPITAL_COMMUNITY): Payer: Self-pay | Admitting: Cardiovascular Disease

## 2019-06-09 ENCOUNTER — Other Ambulatory Visit: Payer: Self-pay | Admitting: Cardiovascular Disease

## 2019-07-05 ENCOUNTER — Telehealth (HOSPITAL_COMMUNITY): Payer: Self-pay | Admitting: Radiology

## 2019-07-05 NOTE — Telephone Encounter (Signed)
Left message to call office-Patient needs to schedule an echocardiogram.  

## 2019-07-24 ENCOUNTER — Other Ambulatory Visit: Payer: Self-pay | Admitting: Cardiovascular Disease

## 2019-07-25 ENCOUNTER — Telehealth (HOSPITAL_COMMUNITY): Payer: Self-pay

## 2019-07-25 NOTE — Telephone Encounter (Signed)
New message   Just an FYI. We have made several attempts to contact this patient including sending a letter to schedule or reschedule their echocardiogram. We will be removing the patient from the echo WQ.   Thank you   07/05/19 lmom evd 7.9.20 @ 2:58pm lm on home vm Mone Commisso 6.17.20 mail reminder letter Jari Sportsman 5/4 Cancel Rsn: Provider (patient called voice mail to cancel echo)

## 2019-11-27 ENCOUNTER — Other Ambulatory Visit: Payer: Self-pay | Admitting: Cardiovascular Disease

## 2019-11-28 ENCOUNTER — Other Ambulatory Visit: Payer: Self-pay | Admitting: Cardiovascular Disease

## 2019-12-15 ENCOUNTER — Other Ambulatory Visit: Payer: Self-pay | Admitting: Cardiovascular Disease

## 2019-12-18 ENCOUNTER — Other Ambulatory Visit: Payer: Self-pay | Admitting: *Deleted

## 2019-12-18 ENCOUNTER — Other Ambulatory Visit: Payer: Self-pay

## 2019-12-18 MED ORDER — CARVEDILOL 25 MG PO TABS: 25.0000 mg | ORAL_TABLET | Freq: Two times a day (BID) | ORAL | 0 refills | Status: DC

## 2019-12-18 NOTE — Telephone Encounter (Signed)
*  STAT* If patient is at the pharmacy, call can be transferred to refill team.   1. Which medications need to be refilled? (please list name of each medication and dose if known) carvedilol (COREG) 25 MG tablet  2. Which pharmacy/location (including street and city if local pharmacy) is medication to be sent to? [id number] - [place], [place] - [address]  3. Do they need a 30 day or 90 day supply? 90 day  Patient is out of medication

## 2020-01-09 ENCOUNTER — Other Ambulatory Visit: Payer: Self-pay | Admitting: Cardiovascular Disease

## 2020-01-15 ENCOUNTER — Other Ambulatory Visit: Payer: Self-pay | Admitting: Cardiovascular Disease

## 2020-02-01 ENCOUNTER — Other Ambulatory Visit: Payer: Self-pay | Admitting: Cardiovascular Disease

## 2020-02-01 ENCOUNTER — Other Ambulatory Visit: Payer: Self-pay | Admitting: Physician Assistant

## 2020-02-01 MED ORDER — CARVEDILOL 25 MG PO TABS: 25.0000 mg | ORAL_TABLET | Freq: Two times a day (BID) | ORAL | 0 refills | Status: DC

## 2020-02-01 NOTE — Telephone Encounter (Signed)
New Message    *STAT* If patient is at the pharmacy, call can be transferred to refill team.   1. Which medications need to be refilled? (please list name of each medication and dose if known) carvedilol (COREG) 25 MG tablet    2. Which pharmacy/location (including street and city if local pharmacy) is medication to be sent to? [id number] - [place], [place] - [address]  3. Do they need a 30 day or 90 day supply? 90

## 2020-02-18 ENCOUNTER — Other Ambulatory Visit: Payer: Self-pay | Admitting: Cardiovascular Disease

## 2020-02-19 ENCOUNTER — Other Ambulatory Visit: Payer: Self-pay | Admitting: Cardiovascular Disease

## 2020-02-19 NOTE — Telephone Encounter (Signed)
 *  STAT* If patient is at the pharmacy, call can be transferred to refill team.   1. Which medications need to be refilled? (please list name of each medication and dose if known)  carvedilol (COREG) 25 MG tablet furosemide (LASIX) 40 MG tablet  2. Which pharmacy/location (including street and city if local pharmacy) is medication to be sent to? [id number] - [place], [place] - [address]  3. Do they need a 30 day or 90 day supply? 30 day supply  Completely out of medication

## 2020-02-19 NOTE — Telephone Encounter (Signed)
*  STAT* If patient is at the pharmacy, call can be transferred to refill team.   1. Which medications need to be refilled? (please list name of each medication and dose if known)  carvedilol (COREG) 25 MG tablet  furosemide (LASIX) 40 MG tablet     2. Which pharmacy/location (including street and city if local pharmacy) is medication to be sent to? [id number] - [place], [place] - [address]  3. Do they need a 30 day or 90 day supply? 90 day supply  Patient states he is completely out of medication. I made the patient aware that he needs to schedule an appointment, however the call was dropped prior to patient verbalizing understanding.

## 2020-02-26 MED ORDER — CARVEDILOL 25 MG PO TABS: 25.0000 mg | ORAL_TABLET | Freq: Two times a day (BID) | ORAL | 0 refills | Status: DC

## 2020-02-26 MED ORDER — FUROSEMIDE 40 MG PO TABS: 40.0000 mg | ORAL_TABLET | Freq: Every day | ORAL | 0 refills | Status: DC

## 2020-03-05 ENCOUNTER — Other Ambulatory Visit: Payer: Self-pay | Admitting: Cardiovascular Disease

## 2020-03-19 ENCOUNTER — Other Ambulatory Visit: Payer: Self-pay | Admitting: Cardiovascular Disease

## 2020-03-20 ENCOUNTER — Other Ambulatory Visit: Payer: Self-pay | Admitting: Cardiovascular Disease

## 2020-03-25 ENCOUNTER — Other Ambulatory Visit: Payer: Self-pay | Admitting: Cardiovascular Disease

## 2020-03-25 NOTE — Telephone Encounter (Signed)
LM2CB- needs appt-overdue APPT scheduled 05/17/2020 2:20 PM OFFICE VISIT [name] Bihari, MD. Will await CB to confirm appt to refill

## 2020-03-25 NOTE — Telephone Encounter (Signed)
*  STAT* If patient is at the pharmacy, call can be transferred to refill team.   1. Which medications need to be refilled? (please list name of each medication and dose if known) carvedilol (COREG) 25 MG tablet  2. Which pharmacy/location (including street and city if local pharmacy) is medication to be sent to? [id number] - [place], [place] - [address]  3. Do they need a 30 day or 90 day supply? 90 day

## 2020-04-08 ENCOUNTER — Other Ambulatory Visit: Payer: Self-pay | Admitting: Cardiovascular Disease

## 2020-05-12 ENCOUNTER — Other Ambulatory Visit: Payer: Self-pay | Admitting: Cardiovascular Disease

## 2020-05-17 ENCOUNTER — Other Ambulatory Visit: Payer: Self-pay

## 2020-05-17 ENCOUNTER — Ambulatory Visit: Payer: Medicare HMO | Admitting: Cardiovascular Disease

## 2020-05-17 VITALS — BP 176/110 | HR 85 | Ht 62.0 in | Wt 128.0 lb

## 2020-05-17 DIAGNOSIS — I4819 Other persistent atrial fibrillation: Secondary | ICD-10-CM

## 2020-05-17 DIAGNOSIS — I340 Nonrheumatic mitral (valve) insufficiency: Secondary | ICD-10-CM | POA: Diagnosis not present

## 2020-05-17 DIAGNOSIS — E785 Hyperlipidemia, unspecified: Secondary | ICD-10-CM

## 2020-05-17 DIAGNOSIS — I5022 Chronic systolic (congestive) heart failure: Secondary | ICD-10-CM | POA: Diagnosis not present

## 2020-05-17 DIAGNOSIS — Z951 Presence of aortocoronary bypass graft: Secondary | ICD-10-CM

## 2020-05-17 DIAGNOSIS — I255 Ischemic cardiomyopathy: Secondary | ICD-10-CM

## 2020-05-17 DIAGNOSIS — I25810 Atherosclerosis of coronary artery bypass graft(s) without angina pectoris: Secondary | ICD-10-CM | POA: Diagnosis not present

## 2020-05-17 DIAGNOSIS — I10 Essential (primary) hypertension: Secondary | ICD-10-CM

## 2020-05-17 MED ORDER — ATORVASTATIN CALCIUM 80 MG PO TABS: 80.0000 mg | ORAL_TABLET | Freq: Every day | ORAL | 3 refills | Status: DC

## 2020-05-17 MED ORDER — POTASSIUM CHLORIDE ER 10 MEQ PO TBCR: 10.0000 meq | EXTENDED_RELEASE_TABLET | Freq: Every day | ORAL | 2 refills | Status: DC

## 2020-05-17 MED ORDER — VALSARTAN 160 MG PO TABS: ORAL_TABLET | ORAL | 3 refills | Status: DC

## 2020-05-17 MED ORDER — FUROSEMIDE 40 MG PO TABS: 40.0000 mg | ORAL_TABLET | Freq: Every day | ORAL | 0 refills | Status: DC

## 2020-05-17 MED ORDER — SPIRONOLACTONE 25 MG PO TABS: ORAL_TABLET | ORAL | 1 refills | Status: DC

## 2020-05-17 MED ORDER — CLOPIDOGREL BISULFATE 75 MG PO TABS: ORAL_TABLET | ORAL | 0 refills | Status: DC

## 2020-05-17 MED ORDER — CARVEDILOL 25 MG PO TABS: ORAL_TABLET | ORAL | 3 refills | Status: DC

## 2020-05-17 MED ORDER — CARVEDILOL 25 MG PO TABS: 25.0000 mg | ORAL_TABLET | Freq: Two times a day (BID) | ORAL | 3 refills | Status: DC

## 2020-05-17 NOTE — Progress Notes (Signed)
Cardiology Office Note    Date:  05/19/2020   ID:  [name] [name], DOB 03/20/1941, MRN [id number]  PCP:  [name] Melter, MD  Cardiologist:  [name] Majestic, MD   F/U office evaluation.  Initial office evaluation 11/19/2017  History of Present Illness:  [name] [name] is a 79 y.o. male  who presents for a 15 month follow-up cardiology evaluation .  [name] [name] is originally from [place].  In March 2017 he presented with accelerated hypertension and increasing shortness of breath.  An echo Doppler study showed an EF of 35%.  He was referred for right and left heart catheterization which was done by me on 03/04/2016.  This revealed severe multivessel CAD with coronary calcification with 50% stenosis in the proximal LAD and diagonal, followed by a napkin ring 75% focal stenosis between the first and second diagonal branch.  There was 50% proximal circumflex stenosis with total occlusion of the OM1 vessel with retrograde collaterals arising from the LAD.  The RCA was totally occluded in its mid segment and there was a 95% stenosis in the acute marginal branch.  There was collaterals to the distal RCA via the distal circumflex and distal LAD.  He did, mild to moderate elevation of right heart pressures.  EF was 35%, consistent with ischemic heart myopathy.  He underwent CABGx4 revascularization surgery by Dr. Tharon Aquas [name] with a LIMA to LAD, SVG to OM, SVG to PDA, and SVG to diagonal.  He also underwent clipping of the left atrial appendage and left carotid endarterectomy.  He had developed atrial fibrillation postoperatively.  In addition, he developed a pleural effusion which required thoracentesis by Dr. Nils [name] in the outpatient setting.  He saw [name] [name] for initial outpatient follow-up in April 2017.  On 04/16/2016.  He underwent cardioversion with restoration of sinus rhythm.  He has not been seen by cardiology since.  I had not seen him since his original hospitalization until he presented to my  office in September 2018.  At that time, he denied any recurrent anginal symptoms or significant dyspnea or palpitations.  He was on amiodarone 200 mg daily, carvedilol 3.125 mg twice a day and was unaware of recurrent atrial fibrillation.  He also has been taking Lasix 40 mg daily, Protonix for GERD and atorvastatin 80 mg for hyperlipidemia.    When I initially saw him, his physical examination suggested a significant murmur of mitral regurgitation.  I scheduled him for 2-D echo Doppler study, and also discussed potential future ARNI therapy.  An echo Doppler study on 09/17/2017 showed further depression of LV function with an EF of 25-30% with diffuse hypocontractility and akinesis of the inferolateral wall.  He had grade 3 diastolic dysfunction.  There was moderate mitral regurgitation, severe LA dilation, moderate RA dilation, and moderate aortic insufficiency.  PA pressure was significant increased at 85 mm.  Laboratory and show normal thyroid function.  He was not anemic.  He had stable renal function and LFTs.  Lipid studies revealed a total cholesterol 124, triglycerides 60, HDL 56, and LDL 56.   When I saw him on 11/19/2017, I discontinued valsartan and started him on Entresto 24/26 twice a day.  I also discussed reinstitution with anticoagulation which he was adamant that he would not do.  As result, I added Plavix 75 mg daily in addition to his aspirin 81 mg.  We also discussed potential need for future ICD implantation.  Over the past 2 months, his breathing has improved.  He  still notes some mild ankle swelling bilaterally.  He denies palpitations.  Follow-up laboratory in 12/16/2017 showed a hemoglobin of 14.1, hematocrit 41.2.  ProBNP was elevated at 3754.  Creatinine was stable at 1.26.  He was seen by [name] [name] in our office on 12/16/2017.  Carvedilol was increased to 6.25 mg twice a day.  She is working on the [employer] patient assistance program.   In February 2019, I titrated Entresto to  49/51 mg twice a day.  Repeat blood work March 2019 showed a creatinine that was stable at 1.21.  ProBNP was still elevated but improved at 2643.  At that time he continued to experience mild peripheral edema.  When I last saw him in May 2019 he was feeling significantly improved on the increase Entresto regimen.  He has been in atrial fibrillation and during that evaluation I again had a thorough discussion with him regarding increased risk for thromboembolism without anticoagulation therapy and he again reiterated that he had no intention to initiate anticoagulation.  During his evaluation I titrated Entresto to optimal dosing at 97/103 mg twice a day.  I recommended he continue his current dose of furosemide at 40 mg.  Over the past several months, he has continued to feel well.  He feels that Delene Loll has made a significant improvement in his sense of well-being.   I last saw him on August 30, 2018.  At that time I again had further discussion regarding his anticoagulation and he remained steadfast in his decision not to initiate and was aware of potential thromboembolic risk.  At that time I recommended he reduce amiodarone to 100 mg daily and discussed ultimate plans to discontinue altogether and further titrate carvedilol.   I last saw him on October 10, 2018.  He continues to have atrial fibrillation with ventricular rate controlled in the 60s.  At that time I discontinued amiodarone since there were no plans for cardioversion without anticoagulation.  Recommended further titration of carvedilol to 18.7 5 in the morning and 12.5 mg at night.  I scheduled him for a follow-up echo Doppler study which was done on November 14, 2018.  This showed an EF of 30 to 35%.  There was mitral annular calcification.  There was now moderately severe MR and moderately severe LA dilation.  PA pressure was increased at 47 mm.  [name] [name] last saw [name] [name], [place] on November 17, 2018.  Over the last several months, he has  continued to feel well.  However, he is concerned about the cost of his Entresto.  He has been titrated up to maximum 97/103 mg twice daily.   I last saw him in March 2020 at which time he was doing well on Entresto 97/103 mg twice a day and was asymptomatic with reference to shortness of breath.  Apparently, since that last evaluation due to cost issues he stopped taking his Entresto.  We had tried to get additional approval from the foundation.  Apparently this was denied.  He had not seen me over the year despite being off treatment.  Over the last 5 days he also has run out of his carvedilol which had been 25 mg twice a day.  Presently he denies significant shortness of breath.  However his blood pressure has increased.  He also does note some occasional ankle swelling.  He presents for evaluation.  Past Medical History:  Diagnosis Date  . Atrial fibrillation ([place]) 03/21/2016    Past Surgical History:  Procedure Laterality Date  .  CARDIAC CATHETERIZATION N/A 03/04/2016   Procedure: Right/Left Heart Cath and Coronary Angiography;  Surgeon: Troy Sine, MD;  Location: [facility];  Service: Cardiovascular;  Laterality: N/A;  . CARDIOVERSION N/A 04/16/2016   Procedure: CARDIOVERSION;  Surgeon: Jerline Pain, MD;  Location: [facility] ENDOSCOPY;  Service: Cardiovascular;  Laterality: N/A;  . CLIPPING OF ATRIAL APPENDAGE Left 03/10/2016   Procedure: CLIPPING OF ATRIAL APPENDAGE;  Surgeon: Ivin Poot, MD;  Location: [facility];  Service: Open Heart Surgery;  Laterality: Left;  . CORONARY ARTERY BYPASS GRAFT N/A 03/10/2016   Procedure: CORONARY ARTERY BYPASS GRAFTING (CABG)TIMES 4 USING LEFT INTERNAL MAMMARY AND BILATERAL GREATER SAPHENOUS VEIN HARVESTED BY ENDOVIEN;  Surgeon: Ivin Poot, MD;  Location: [facility];  Service: Open Heart Surgery;  Laterality: N/A;  . ENDARTERECTOMY Left 03/10/2016   Procedure: LEFT ENDARTERECTOMY CAROTID DACRON PATCH ANGIOPLASTY;  Surgeon: Elam Dutch, MD;  Location:  [facility];  Service: Vascular;  Laterality: Left;  . TEE WITHOUT CARDIOVERSION N/A 03/10/2016   Procedure: TRANSESOPHAGEAL ECHOCARDIOGRAM (TEE);  Surgeon: Ivin Poot, MD;  Location: [facility];  Service: Open Heart Surgery;  Laterality: N/A;    Current Medications: Outpatient Medications Prior to Visit  Medication Sig Dispense Refill  . Ascorbic Acid (VITAMIN C) 1000 MG tablet Take 1,000 mg by mouth daily.    Marland Kitchen aspirin 81 MG tablet Take 81 mg by mouth every Monday, Wednesday, and Friday.    . magnesium 30 MG tablet Take 30 mg by mouth daily.    . pantoprazole (PROTONIX) 40 MG tablet Take 1 tablet (40 mg total) by mouth daily. PLEASE MAKE APPOINTMENT FOR FURTHER REFILLS 90 tablet 0  . sacubitril-valsartan (ENTRESTO) 97-103 MG Take 1 tablet by mouth 2 (two) times daily. 180 tablet 3  . atorvastatin (LIPITOR) 80 MG tablet TAKE 1 TABLET (80 MG TOTAL) BY MOUTH DAILY AT 6 PM. 90 tablet 3  . carvedilol (COREG) 25 MG tablet Take 1 tablet (25 mg total) by mouth 2 (two) times daily with a meal. NEED OV. 30 tablet 0  . clopidogrel (PLAVIX) 75 MG tablet TAKE 1 TABLET BY MOUTH DAILY. PLEASE CALL AND SCHEDULE AN APPOINTMENT FOR FURTHER REFILLS 90 tablet 0  . furosemide (LASIX) 40 MG tablet Take 1 tablet (40 mg total) by mouth daily. Please schedule annual appt with Dr. Claiborne Billings for refills. [phone]. 1st attempt 30 tablet 0  . potassium chloride (KLOR-CON) 10 MEQ tablet TAKE 1 TABLET BY MOUTH EVERY DAY 90 tablet 2  . spironolactone (ALDACTONE) 25 MG tablet TAKE 0.5 TABLETS BY MOUTH DAILY. PLEASE SCHEDULE APPT WITH DR. Claiborne Billings FOR REFILLS. THANK YOU 45 tablet 1   No facility-administered medications prior to visit.     Allergies:   Patient has no known allergies.   Social History   Socioeconomic History  . Marital status: Married    Spouse name: Not on file  . Number of children: Not on file  . Years of education: Not on file  . Highest education level: Not on file  Occupational History  . Not on file    Tobacco Use  . Smoking status: Former Research scientist (life sciences)  . Smokeless tobacco: Former Network engineer and Sexual Activity  . Alcohol use: Not on file  . Drug use: Not on file  . Sexual activity: Not on file  Other Topics Concern  . Not on file  Social History Narrative  . Not on file   Social Determinants of Health   Financial Resource Strain:   .  Difficulty of Paying Living Expenses:   Food Insecurity:   . Worried About Charity fundraiser in the Last Year:   . Arboriculturist in the Last Year:   Transportation Needs:   . Film/video editor (Medical):   Marland Kitchen Lack of Transportation (Non-Medical):   Physical Activity:   . Days of Exercise per Week:   . Minutes of Exercise per Session:   Stress:   . Feeling of Stress :   Social Connections:   . Frequency of Communication with Friends and Family:   . Frequency of Social Gatherings with Friends and Family:   . Attends Religious Services:   . Active Member of Clubs or Organizations:   . Attends Archivist Meetings:   Marland Kitchen Marital Status:      Family History:  The patient's family history is not on file.  His parents are deceased.  He came to [place] in the 1960s from [place].  ROS General: Negative; No fevers, chills, or night sweats;  HEENT: Negative; No changes in vision or hearing, sinus congestion, difficulty swallowing Pulmonary: Negative; No cough, wheezing, shortness of breath, hemoptysis Cardiovascular: See history of present illness GI: Negative; No nausea, vomiting, diarrhea, or abdominal pain GU: Negative; No dysuria, hematuria, or difficulty voiding Musculoskeletal: Negative; no myalgias, joint pain, or weakness Hematologic/Oncology: Negative; no easy bruising, bleeding Endocrine: Negative; no heat/cold intolerance; no diabetes Neuro: Negative; no changes in balance, headaches Skin: Negative; No rashes or skin lesions Psychiatric: Negative; No behavioral problems, depression Sleep: Negative; No snoring,  daytime sleepiness, hypersomnolence, bruxism, restless legs, hypnogognic hallucinations, no cataplexy Other comprehensive 14 point system review is negative.   PHYSICAL EXAM:   VS:  BP (!) 176/110   Pulse 85   Ht '5\' 2"'  (1.575 m)   Wt 128 lb (58.1 kg)   SpO2 97%   BMI 23.41 kg/m     Repeat blood pressure by me was 170/100  Wt Readings from Last 3 Encounters:  05/17/20 128 lb (58.1 kg)  02/15/19 126 lb 6.4 oz (57.3 kg)  11/17/18 120 lb (54.4 kg)    General: Alert, oriented, no distress.  Skin: normal turgor, no rashes, warm and dry HEENT: Normocephalic, atraumatic. Pupils equal round and reactive to light; sclera anicteric; extraocular muscles intact;  Nose without nasal septal hypertrophy Mouth/Parynx benign; Mallinpatti scale 3 Neck: No JVD, no carotid bruits; normal carotid upstroke Lungs: clear to ausculatation and percussion; no wheezing or rales Chest wall: without tenderness to palpitation Heart: PMI not displaced, RRR, s1 s2 normal, 3-1/5 systolic murmur, no diastolic murmur, no rubs, gallops, thrills, or heaves Abdomen: soft, nontender; no hepatosplenomehaly, BS+; abdominal aorta nontender and not dilated by palpation. Back: no CVA tenderness Pulses 2+ Musculoskeletal: full range of motion, normal strength, no joint deformities Extremities: 1+ ankle edema right greater than left; no clubbing cyanosis, Homan's sign negative  Neurologic: grossly nonfocal; Cranial nerves grossly wnl Psychologic: Normal mood and affect    Studies/Labs Reviewed:   EKG:  EKG is ordered today. ECG (independently read by me): Atrial fibrillation with rate in the 80s, PVC, QS complex V1 through V3 lateral T wave abnormality.  February 15, 2019 ECG (independently read by me): Atrial fibrillation at 66 bpm.  Poor anterior R wave progression.  Nonspecific T wave abnormality.  Normal QTc interval at 442 ms.  October 2019 ECG (independently read by me): Atrial fibrillation at 65 bpm.  Inferolateral  ST segment changes.  QS complex V1 through V3.  August 30, 2018 ECG (independently read by me): Atrial fibrillation with ventricular rate at 63 bpm.  Inferolateral ST changes.  LVH by voltage  May 2019 ECG (independently read by me): Atrial Fibrillation at 73 bpm.  LVH.  Nonspecific intraventricular conduction delay.  ST-T changes  February 2018 ECG (independently read by me): Atrial fibrillation at 69 bpm.  Incomplete left bundle blanch block.  ST-T changes.  December 2018 ECG (independently read by me):  Probable atrial fibrillation with ventricular rate in the 60s.  LVH with repolarization changes.  QTc interval 464 ms.  September 2018 ECG (independently read by me): Normal sinus rhythm at 64.  First AV block with a PR interval 204 ms.  LVH with repolarization changes.  QTc interval 470 ms.  Recent Labs: BMP Latest Ref Rng & Units 09/16/2018 08/30/2018 02/23/2018  Glucose 65 - 99 mg/dL 89 78 86  BUN 8 - 27 mg/dL '25 21 21  ' Creatinine 0.76 - 1.27 mg/dL 1.47(H) 1.37(H) 1.21  BUN/Creat Ratio 10 - '24 17 15 17  ' Sodium 134 - 144 mmol/L 140 145(H) 144  Potassium 3.5 - 5.2 mmol/L 4.6 4.0 3.8  Chloride 96 - 106 mmol/L 101 103 103  CO2 20 - 29 mmol/L '26 28 27  ' Calcium 8.6 - 10.2 mg/dL 10.4(H) 10.1 10.1     Hepatic Function Latest Ref Rng & Units 08/30/2018 12/16/2017 09/10/2017  Total Protein 6.0 - 8.5 g/dL 6.3 7.0 7.0  Albumin 3.5 - 4.8 g/dL 3.9 3.4(L) 4.0  AST 0 - 40 IU/L '18 23 25  ' ALT 0 - 44 IU/L '23 27 31  ' Alk Phosphatase 39 - 117 IU/L 81 92 93  Total Bilirubin 0.0 - 1.2 mg/dL 0.9 0.7 1.0    CBC Latest Ref Rng & Units 12/16/2017 09/10/2017 04/13/2016  WBC 3.4 - 10.8 x10E3/uL 10.4 9.5 9.9  Hemoglobin 13.0 - 17.7 g/dL 14.1 14.3 12.7(L)  Hematocrit 37.5 - 51.0 % 41.2 40.9 38.1(L)  Platelets 150 - 379 x10E3/uL 223 192 247   Lab Results  Component Value Date   MCV 97 12/16/2017   MCV 97 09/10/2017   MCV 93.4 04/13/2016   Lab Results  Component Value Date   TSH 2.120 09/10/2017   Lab  Results  Component Value Date   HGBA1C 5.4 03/04/2016     BNP    Component Value Date/Time   BNP 513.7 (H) 03/02/2016 1421    ProBNP    Component Value Date/Time   PROBNP 3,065 (H) 08/30/2018 1155     Lipid Panel     Component Value Date/Time   CHOL 124 09/10/2017 1202   TRIG 60 09/10/2017 1202   HDL 56 09/10/2017 1202   CHOLHDL 2.2 09/10/2017 1202   CHOLHDL 4.5 03/04/2016 0400   VLDL 13 03/04/2016 0400   LDLCALC 56 09/10/2017 1202     RADIOLOGY: No results found.   Additional studies/ records that were reviewed today include:  I reviewed his catheterization report,Echo Doppler data, hospital records, office visits with Dr. Nils [name] and Tarri Fuller.  03/04/2016.  Cardiac catheterization Conclusion    RPDA lesion, 95% stenosed.  Prox RCA lesion, 80% stenosed.  Dist RCA lesion, 100% stenosed.  LM lesion, 50% stenosed.  Ost 1st Diag to 1st Diag lesion, 50% stenosed.  Prox LAD lesion, 75% stenosed.  1st Mrg lesion, 100% stenosed.  There is moderate left ventricular systolic dysfunction.   Severe multivessel CAD with coronary calcification and 50% stenosis in the proximal LAD and diagonal vessel followed by a napkin  ring 75% focal stenosis between the first and second diagonal branch; 50% proximal circumflex stenosis with total occlusion of the OM1 vessel with retrograde collaterals arising from the LAD; and total occlusion of the mid RCA with 95% stenosis in the acute margin branch, and evidence for collaterals to the distal RCA via the distal circumflex and distal LAD.  Mild to moderate elevation of right heart pressures.  Moderately severe LV dysfunction with more pronounced inferior hypocontractility and EF of 35%  consistent with an ischemic cardiomyopathy.  RECOMMENDATION: Surgical consultation for consideration of CABG revascularization surgery.      ------------------------------------------------------------------- 03/03/2016 ECHO  Study Conclusions  - Left ventricle: The cavity size was normal. There was mild   concentric hypertrophy. Systolic function was moderately reduced.   The estimated ejection fraction was in the range of 35% to 40%.   Diffuse hypokinesis with akinesis of the basal and mid   inferolateral walls. Features are consistent with a pseudonormal   left ventricular filling pattern, with concomitant abnormal   relaxation and increased filling pressure (grade 2 diastolic   dysfunction). Doppler parameters are consistent with elevated   ventricular end-diastolic filling pressure. - Aortic valve: Structurally normal valve. There was mild   regurgitation. - Aorta: The aorta was normal, not dilated, and non-diseased. - Mitral valve: Structurally normal valve. There was moderate   regurgitation. - Left atrium: The atrium was mildly dilated. - Right ventricle: The cavity size was normal. Wall thickness was   normal. Systolic function was normal. - Right atrium: The atrium was normal in size. - Tricuspid valve: There was mild regurgitation. - Pulmonary arteries: Systolic pressure was mildly increased. PA   peak pressure: 38 mm Hg (S). - Pericardium, extracardiac: The pericardium was normal in   appearance. There was a left pleural effusion.   ASSESSMENT:    1. Nonrheumatic mitral valve regurgitation   2. Coronary artery disease involving coronary bypass graft of native heart without angina pectoris   3. Ischemic cardiomyopathy   4. S/P CABG x 4   5. Persistent atrial fibrillation ([facility])   6. Hyperlipidemia LDL goal <70   7. Chronic systolic heart failure ([place])   8. Essential hypertension   9. Hyperlipidemia with target LDL less than 70     PLAN:  [name] [name] is a 79 year-old gentleman who is originally from [place].  He was found to have an ischemic cardiomyopathy with accelerated hypertension and multivessel CAD in March 2017.  He underwent  CABG revascularization surgery 4.  He developed  postoperative atrial fibrillation and also developed a subsequent left pleural effusion which ultimately required thoracentesis.  He underwent cardioversion for atrial fibrillation in May 2017.  Apparently, he had been on eliquis at that time, but developed significant nose bleeding and subsequently has not been on anticoagulation.  When I saw him for my initial evaluation in December 2018 following his hospitalization I discussed resumption of anticoagulation.  He was adamant that he would not start this.  As result, he has been on aspirin and Plavix.  He is aware of risk of thromboembolism function and continues to have permanent atrial fibrillation.  He is off amiodarone and just on rate control with carvedilol which now has been titrated to 25 mg twice a day.  He was started on Entresto in December 2018 and following several office visits was titrated to maximum dosing at 97/103 with excellent response.  He remained euvolemic.  Apparently, over the past year he was denied by insurance for reapproval.  I am not certain as to why.  Apparently stopped taking the medication.  He has not seen me since being off the medication.  His blood pressure today is significantly elevated consistent with stage II hypertension.  In addition he had run out of carvedilol for the past 4 to 5 days and previously had been on 25 mg twice daily regimen.  His resting pulse today is increased at 97.  Presently I will resume carvedilol at 12.5 mg twice a day for the next 4 days and then he will titrate back to 25 mg twice a day.  He had been on Entresto and is no longer on this therapy.  I will reinitiate valsartan alone and will provide him with 160 mg pill but have recommended he take 80 mg for the first week and then titrate to 160 mg.  I am recommending he follow-up with Joslyn Hy, Pharm.D. in 4 weeks for reevaluation and dose titration.  I am scheduling him for follow-up echo Doppler study in 2 months and will see him for  follow-up evaluation.     Medication Adjustments/Labs and Tests Ordered: Current medicines are reviewed at length with the patient today.  Concerns regarding medicines are outlined above.  Medication changes, Labs and Tests ordered today are listed in the Patient Instructions below. Patient Instructions  Medication Instructions:  BEGIN TAKING VALSARTAN 160MG- FOR THE FIRST WEEK TAKE 1/2 TABLET DAILY, THEN INCREASE TO 1 WHOLE TABLET  RESUME YOUR CARVEDILOL 25MG- FOR THE FIRST 4 DAYS TAKE 1/2 TABLET TWICE A DAY, THEN INCREASE TO 1 WHOLE TABLET TWICE A DAY  STOP YOUR ENTRESTO  *If you need a refill on your cardiac medications before your next appointment, please call your pharmacy*   Lab Work: FASTING LABS: CMET CBC TSH LIPID BNP  If you have labs (blood work) drawn today and your tests are completely normal, you will receive your results only by: Marland Kitchen MyChart Message (if you have MyChart) OR . A paper copy in the mail If you have any lab test that is abnormal or we need to change your treatment, we will call you to review the results.   Testing/Procedures:PRIOR TO NEXT OFFICE VISIT  Your physician has requested that you have an echocardiogram. Echocardiography is a painless test that uses sound waves to create images of your heart. It provides your doctor with information about the size and shape of your heart and how well your heart's chambers and valves are working. This procedure takes approximately one hour. There are no restrictions for this procedure.  [place]  Follow-Up: At [facility], you and your health needs are our priority.  As part of our continuing mission to provide you with exceptional heart care, we have created designated Provider Care Teams.  These Care Teams include your primary Cardiologist (physician) and Advanced Practice Providers (APPs -  Physician Assistants and Nurse Practitioners) who all work together to provide you with the care you need,  when you need it.  We recommend signing up for the patient portal called "MyChart".  Sign up information is provided on this After Visit Summary.  MyChart is used to connect with patients for Virtual Visits (Telemedicine).  Patients are able to view lab/test results, encounter notes, upcoming appointments, etc.  Non-urgent messages can be sent to your provider as well.   To learn more about what you can do with MyChart, go to [email or web address].    Your next appointment:   2 month(s)  The format  for your next appointment:   In Person  Provider:   Shelva Majestic, MD   Other Instructions FOLLOW UP WITH PHARMACIST IN 4 WEEKS HERE AT [facility]   Time spent: 25 minutes Signed, [name] Majestic, MD  05/19/2020 4:47 PM    [facility] [address], [place], [place],   [address] Phone: [phone]

## 2020-05-17 NOTE — Patient Instructions (Signed)
Medication Instructions:  BEGIN TAKING VALSARTAN 160MG - FOR THE FIRST WEEK TAKE 1/2 TABLET DAILY, THEN INCREASE TO 1 WHOLE TABLET  RESUME YOUR CARVEDILOL 25MG - FOR THE FIRST 4 DAYS TAKE 1/2 TABLET TWICE A DAY, THEN INCREASE TO 1 WHOLE TABLET TWICE A DAY  STOP YOUR ENTRESTO  *If you need a refill on your cardiac medications before your next appointment, please call your pharmacy*   Lab Work: FASTING LABS: CMET CBC TSH LIPID BNP  If you have labs (blood work) drawn today and your tests are completely normal, you will receive your results only by: MyChart Message (if you have MyChart) OR . A paper copy in the mail If you have any lab test that is abnormal or we need to change your treatment, we will call you to review the results.   Testing/Procedures:PRIOR TO NEXT OFFICE VISIT  Your physician has requested that you have an echocardiogram. Echocardiography is a painless test that uses sound waves to create images of your heart. It provides your doctor with information about the size and shape of your heart and how well your heart's chambers and valves are working. This procedure takes approximately one hour. There are no restrictions for this procedure.  [address]  Follow-Up: At [facility], you and your health needs are our priority.  As part of our continuing mission to provide you with exceptional heart care, we have created designated Provider Care Teams.  These Care Teams include your primary Cardiologist (physician) and Advanced Practice Providers (APPs -  Physician Assistants and Nurse Practitioners) who all work together to provide you with the care you need, when you need it.  We recommend signing up for the patient portal called "MyChart".  Sign up information is provided on this After Visit Summary.  MyChart is used to connect with patients for Virtual Visits (Telemedicine).  Patients are able to view lab/test results, encounter notes, upcoming appointments,  etc.  Non-urgent messages can be sent to your provider as well.   To learn more about what you can do with MyChart, go to [facility].    Your next appointment:   2 month(s)  The format for your next appointment:   In Person  Provider:   [facility] MD   Other Instructions FOLLOW UP WITH PHARMACIST IN 4 WEEKS HERE AT [facility]

## 2020-05-19 ENCOUNTER — Encounter: Payer: Self-pay | Admitting: Cardiovascular Disease

## 2020-06-06 DIAGNOSIS — I10 Essential (primary) hypertension: Secondary | ICD-10-CM | POA: Diagnosis not present

## 2020-06-06 DIAGNOSIS — Z951 Presence of aortocoronary bypass graft: Secondary | ICD-10-CM | POA: Diagnosis not present

## 2020-06-06 DIAGNOSIS — I255 Ischemic cardiomyopathy: Secondary | ICD-10-CM | POA: Diagnosis not present

## 2020-06-06 DIAGNOSIS — I5022 Chronic systolic (congestive) heart failure: Secondary | ICD-10-CM | POA: Diagnosis not present

## 2020-06-06 DIAGNOSIS — I25810 Atherosclerosis of coronary artery bypass graft(s) without angina pectoris: Secondary | ICD-10-CM | POA: Diagnosis not present

## 2020-06-06 DIAGNOSIS — E785 Hyperlipidemia, unspecified: Secondary | ICD-10-CM | POA: Diagnosis not present

## 2020-06-06 DIAGNOSIS — I340 Nonrheumatic mitral (valve) insufficiency: Secondary | ICD-10-CM | POA: Diagnosis not present

## 2020-06-06 DIAGNOSIS — I4819 Other persistent atrial fibrillation: Secondary | ICD-10-CM | POA: Diagnosis not present

## 2020-06-07 ENCOUNTER — Other Ambulatory Visit: Payer: Self-pay

## 2020-06-07 ENCOUNTER — Ambulatory Visit (HOSPITAL_COMMUNITY): Payer: Medicare HMO | Attending: Cardiology

## 2020-06-07 DIAGNOSIS — E785 Hyperlipidemia, unspecified: Secondary | ICD-10-CM

## 2020-06-07 DIAGNOSIS — I10 Essential (primary) hypertension: Secondary | ICD-10-CM | POA: Diagnosis not present

## 2020-06-07 DIAGNOSIS — I25810 Atherosclerosis of coronary artery bypass graft(s) without angina pectoris: Secondary | ICD-10-CM | POA: Diagnosis not present

## 2020-06-07 DIAGNOSIS — I5022 Chronic systolic (congestive) heart failure: Secondary | ICD-10-CM | POA: Diagnosis not present

## 2020-06-07 DIAGNOSIS — Z951 Presence of aortocoronary bypass graft: Secondary | ICD-10-CM

## 2020-06-07 DIAGNOSIS — I4819 Other persistent atrial fibrillation: Secondary | ICD-10-CM

## 2020-06-07 DIAGNOSIS — I340 Nonrheumatic mitral (valve) insufficiency: Secondary | ICD-10-CM

## 2020-06-07 DIAGNOSIS — I255 Ischemic cardiomyopathy: Secondary | ICD-10-CM | POA: Diagnosis not present

## 2020-06-07 LAB — LIPID PANEL
Chol/HDL Ratio: 2.2 ratio (ref 0.0–5.0)
Cholesterol, Total: 112 mg/dL (ref 100–199)
HDL: 51 mg/dL (ref >39)
LDL Chol Calc (NIH): 46 mg/dL (ref 0–99)
Triglycerides: 75 mg/dL (ref 0–149)
VLDL Cholesterol Cal: 15 mg/dL (ref 5–40)

## 2020-06-07 LAB — COMPREHENSIVE METABOLIC PANEL
ALT: 16 IU/L (ref 0–44)
AST: 15 IU/L (ref 0–40)
Albumin/Globulin Ratio: 1.4 (ref 1.2–2.2)
Albumin: 4.1 g/dL (ref 3.7–4.7)
Alkaline Phosphatase: 96 IU/L (ref 48–121)
BUN/Creatinine Ratio: 16 (ref 10–24)
BUN: 24 mg/dL (ref 8–27)
Bilirubin Total: 1.2 mg/dL (ref 0.0–1.2)
CO2: 26 mmol/L (ref 20–29)
Calcium: 10.8 mg/dL — ABNORMAL HIGH (ref 8.6–10.2)
Chloride: 99 mmol/L (ref 96–106)
Creatinine, Ser: 1.49 mg/dL — ABNORMAL HIGH (ref 0.76–1.27)
GFR calc Af Amer: 51 mL/min/{1.73_m2} — ABNORMAL LOW (ref >59)
GFR calc non Af Amer: 44 mL/min/{1.73_m2} — ABNORMAL LOW (ref >59)
Globulin, Total: 3 g/dL (ref 1.5–4.5)
Glucose: 82 mg/dL (ref 65–99)
Potassium: 4.5 mmol/L (ref 3.5–5.2)
Sodium: 138 mmol/L (ref 134–144)
Total Protein: 7.1 g/dL (ref 6.0–8.5)

## 2020-06-07 LAB — CBC
Hematocrit: 44.6 % (ref 37.5–51.0)
Hemoglobin: 15.1 g/dL (ref 13.0–17.7)
MCH: 32 pg (ref 26.6–33.0)
MCHC: 33.9 g/dL (ref 31.5–35.7)
MCV: 95 fL (ref 79–97)
Platelets: 149 10*3/uL — ABNORMAL LOW (ref 150–450)
RBC: 4.72 x10E6/uL (ref 4.14–5.80)
RDW: 12.6 % (ref 11.6–15.4)
WBC: 7.9 10*3/uL (ref 3.4–10.8)

## 2020-06-07 LAB — BRAIN NATRIURETIC PEPTIDE: BNP: 279.6 pg/mL — ABNORMAL HIGH (ref 0.0–100.0)

## 2020-06-07 LAB — TSH: TSH: 3.26 u[IU]/mL (ref 0.450–4.500)

## 2020-06-09 ENCOUNTER — Other Ambulatory Visit: Payer: Self-pay | Admitting: Cardiovascular Disease

## 2020-07-12 ENCOUNTER — Telehealth: Payer: Self-pay | Admitting: Cardiovascular Disease

## 2020-07-12 NOTE — Telephone Encounter (Signed)
Patient made aware of results and verbalized understanding.  EF 30 to 35%. Mitral annular calcification. Moderately severe MR; moderately severe LA dilation. Increased PA pressure at 47 mm  Appointment 07/23/20

## 2020-07-12 NOTE — Telephone Encounter (Signed)
Looks like Dr. Landry Dyke office at [facility] tried to reach pt to go over echo results.

## 2020-07-12 NOTE — Telephone Encounter (Signed)
New Message   Patient is returning call that he received. Not showing any notes.

## 2020-07-23 ENCOUNTER — Other Ambulatory Visit: Payer: Self-pay

## 2020-07-23 ENCOUNTER — Ambulatory Visit: Payer: Medicare HMO | Admitting: Cardiovascular Disease

## 2020-07-23 VITALS — BP 108/62 | HR 67 | Ht 62.0 in | Wt 118.0 lb

## 2020-07-23 DIAGNOSIS — I255 Ischemic cardiomyopathy: Secondary | ICD-10-CM | POA: Diagnosis not present

## 2020-07-23 DIAGNOSIS — I351 Nonrheumatic aortic (valve) insufficiency: Secondary | ICD-10-CM

## 2020-07-23 DIAGNOSIS — I25810 Atherosclerosis of coronary artery bypass graft(s) without angina pectoris: Secondary | ICD-10-CM

## 2020-07-23 DIAGNOSIS — I4819 Other persistent atrial fibrillation: Secondary | ICD-10-CM | POA: Diagnosis not present

## 2020-07-23 DIAGNOSIS — E785 Hyperlipidemia, unspecified: Secondary | ICD-10-CM

## 2020-07-23 DIAGNOSIS — Z951 Presence of aortocoronary bypass graft: Secondary | ICD-10-CM | POA: Diagnosis not present

## 2020-07-23 DIAGNOSIS — I340 Nonrheumatic mitral (valve) insufficiency: Secondary | ICD-10-CM

## 2020-07-23 MED ORDER — FUROSEMIDE 20 MG PO TABS: 20.0000 mg | ORAL_TABLET | Freq: Every day | ORAL | 3 refills | Status: DC

## 2020-07-23 NOTE — Progress Notes (Signed)
 Cardiology Office Note    Date:  07/25/2020   ID:  [name] [name], DOB 12/12/1941, MRN [id number]  PCP:  Meyers, Stephen, MD  Cardiologist:  [name] Kelly, MD   F/U office evaluation.  Initial office evaluation 11/19/2017  History of Present Illness:  [name] [name] is a 79 y.o. male  who presents for a 2-month follow-up cardiology evaluation.  [name] [name] is originally from [place].  In March 2017 he presented with accelerated hypertension and increasing shortness of breath.  An echo Doppler study showed an EF of 35%.  He was referred for right and left heart catheterization which was done by me on 03/04/2016.  This revealed severe multivessel CAD with coronary calcification with 50% stenosis in the proximal LAD and diagonal, followed by a napkin ring 75% focal stenosis between the first and second diagonal branch.  There was 50% proximal circumflex stenosis with total occlusion of the OM1 vessel with retrograde collaterals arising from the LAD.  The RCA was totally occluded in its mid segment and there was a 95% stenosis in the acute marginal branch.  There was collaterals to the distal RCA via the distal circumflex and distal LAD.  He did, mild to moderate elevation of right heart pressures.  EF was 35%, consistent with ischemic heart myopathy.  He underwent CABGx4 revascularization surgery by Dr. Peter Van [name] with a LIMA to LAD, SVG to OM, SVG to PDA, and SVG to diagonal.  He also underwent clipping of the left atrial appendage and left carotid endarterectomy.  He had developed atrial fibrillation postoperatively.  In addition, he developed a pleural effusion which required thoracentesis by Dr. Van [name] in the outpatient setting.  He saw [name] [name] for initial outpatient follow-up in April 2017.  On 04/16/2016 he underwent cardioversion with restoration of sinus rhythm.  He has not been seen by cardiology since.  I had not seen him since his original hospitalization until he presented to my  office in September 2018.  At that time, he denied any recurrent anginal symptoms or significant dyspnea or palpitations.  He was on amiodarone 200 mg daily, carvedilol 3.125 mg twice a day and was unaware of recurrent atrial fibrillation.  He also has been taking Lasix 40 mg daily, Protonix for GERD and atorvastatin 80 mg for hyperlipidemia.    When I initially saw him, his physical examination suggested a significant murmur of mitral regurgitation.  I scheduled him for 2-D echo Doppler study, and also discussed potential future ARNI therapy.  An echo Doppler study on 09/17/2017 showed further depression of LV function with an EF of 25-30% with diffuse hypocontractility and akinesis of the inferolateral wall.  He had grade 3 diastolic dysfunction.  There was moderate mitral regurgitation, severe LA dilation, moderate RA dilation, and moderate aortic insufficiency.  PA pressure was significant increased at 85 mm.  Laboratory and show normal thyroid function.  He was not anemic.  He had stable renal function and LFTs.  Lipid studies revealed a total cholesterol 124, triglycerides 60, HDL 56, and LDL 56.   When I saw him on 11/19/2017, I discontinued valsartan and started him on Entresto 24/26 twice a day.  I also discussed reinstitution with anticoagulation which he was adamant that he would not do.  As result, I added Plavix 75 mg daily in addition to his aspirin 81 mg.  We also discussed potential need for future ICD implantation.  Over the past 2 months, his breathing has improved.  He still notes some   mild ankle swelling bilaterally.  He denies palpitations.  Follow-up laboratory in 12/16/2017 showed a hemoglobin of 14.1, hematocrit 41.2.  ProBNP was elevated at 3754.  Creatinine was stable at 1.26.  He was seen by [name] [name] in our office on 12/16/2017.  Carvedilol was increased to 6.25 mg twice a day.  She is working on the [employer] patient assistance program.   In February 2019, I titrated Entresto to  49/51 mg twice a day.  Repeat blood work March 2019 showed a creatinine that was stable at 1.21.  ProBNP was still elevated but improved at 2643.  At that time he continued to experience mild peripheral edema.  When I last saw him in May 2019 he was feeling significantly improved on the increase Entresto regimen.  He has been in atrial fibrillation and during that evaluation I again had a thorough discussion with him regarding increased risk for thromboembolism without anticoagulation therapy and he again reiterated that he had no intention to initiate anticoagulation.  During his evaluation I titrated Entresto to optimal dosing at 97/103 mg twice a day.  I recommended he continue his current dose of furosemide at 40 mg.  Over the past several months, he has continued to feel well.  He feels that [name] [name] has made a significant improvement in his sense of well-being.   I saw him on August 30, 2018.  At that time I again had further discussion regarding his anticoagulation and he remained steadfast in his decision not to initiate and was aware of potential thromboembolic risk.  At that time I recommended he reduce amiodarone to 100 mg daily and discussed ultimate plans to discontinue altogether and further titrate carvedilol.   I last saw him on October 10, 2018.  He continues to have atrial fibrillation with ventricular rate controlled in the 60s.  At that time I discontinued amiodarone since there were no plans for cardioversion without anticoagulation.  Recommended further titration of carvedilol to 18.7 5 in the morning and 12.5 mg at night.  I scheduled him for a follow-up echo Doppler study which was done on November 14, 2018.  This showed an EF of 30 to 35%.  There was mitral annular calcification.  There was now moderately severe MR and moderately severe LA dilation.  PA pressure was increased at 47 mm.  [name] [name] last saw [name] [name], [place] on November 17, 2018.  Over the last several months, he has  continued to feel well.  However, he is concerned about the cost of his Entresto.  He has been titrated up to maximum 97/103 mg twice daily.   I saw him in March 2020 at which time he was doing well on Entresto 97/103 mg twice a day and was asymptomatic with reference to shortness of breath.    I last saw him in June 2021 and since his March evaluation due to cost issues he stopped taking his Entresto.  We had tried to get additional approval from the foundation.  Apparently this was denied.  He had not seen me over the year despite being off treatment.  Over the last 5 days he also ran out of his carvedilol which had been 25 mg twice a day.  Presently he denies significant shortness of breath.  However his blood pressure has increased.  He also does note some occasional ankle swelling.  During that evaluation, I reinitiated treatment with carvedilol 12.5 mg twice a day since his resting pulse was 97 and discuss further titration to  25 mg twice a day thereafter.  Since he was no longer on Entresto, I reinitiated valsartan alone and recommended he take 80 mg for the first week with titration up to 160 mg daily.  Over the past several months he has felt improved with reinstitution of valsartan and carvedilol.  He denies chest pain PND orthopnea.  He denies dizziness.  He has continued to take carvedilol 25 mg twice a day, spironolactone 12.5 mg, valsartan 160 mg in addition to furosemide 40 mg.  Past Medical History:  Diagnosis Date  . Atrial fibrillation ([place]) 03/21/2016    Past Surgical History:  Procedure Laterality Date  . CARDIAC CATHETERIZATION N/A 03/04/2016   Procedure: Right/Left Heart Cath and Coronary Angiography;  Surgeon: Troy Sine, MD;  Location: [facility] LAB;  Service: Cardiovascular;  Laterality: N/A;  . CARDIOVERSION N/A 04/16/2016   Procedure: CARDIOVERSION;  Surgeon: Jerline Pain, MD;  Location: [facility] ENDOSCOPY;  Service: Cardiovascular;  Laterality: N/A;  . CLIPPING OF ATRIAL  APPENDAGE Left 03/10/2016   Procedure: CLIPPING OF ATRIAL APPENDAGE;  Surgeon: Ivin Poot, MD;  Location: [facility];  Service: Open Heart Surgery;  Laterality: Left;  . CORONARY ARTERY BYPASS GRAFT N/A 03/10/2016   Procedure: CORONARY ARTERY BYPASS GRAFTING (CABG)TIMES 4 USING LEFT INTERNAL MAMMARY AND BILATERAL GREATER SAPHENOUS VEIN HARVESTED BY ENDOVIEN;  Surgeon: Ivin Poot, MD;  Location: [facility];  Service: Open Heart Surgery;  Laterality: N/A;  . ENDARTERECTOMY Left 03/10/2016   Procedure: LEFT ENDARTERECTOMY CAROTID DACRON PATCH ANGIOPLASTY;  Surgeon: Elam Dutch, MD;  Location: [facility];  Service: Vascular;  Laterality: Left;  . TEE WITHOUT CARDIOVERSION N/A 03/10/2016   Procedure: TRANSESOPHAGEAL ECHOCARDIOGRAM (TEE);  Surgeon: Ivin Poot, MD;  Location: [place];  Service: Open Heart Surgery;  Laterality: N/A;    Current Medications: Outpatient Medications Prior to Visit  Medication Sig Dispense Refill  . Ascorbic Acid (VITAMIN C) 1000 MG tablet Take 1,000 mg by mouth daily.    Marland Kitchen aspirin 81 MG tablet Take 81 mg by mouth every Monday, Wednesday, and Friday.    Marland Kitchen atorvastatin (LIPITOR) 80 MG tablet Take 1 tablet (80 mg total) by mouth daily at 6 PM. 90 tablet 3  . carvedilol (COREG) 25 MG tablet TAKE 1/2 TABLET TWICE A DAY FOR 4 DAYS, THEN INCREASE TO 1 WHOLE TABLET TWICE A DAY 180 tablet 3  . clopidogrel (PLAVIX) 75 MG tablet TAKE 1 TABLET BY MOUTH DAILY. 90 tablet 0  . magnesium 30 MG tablet Take 30 mg by mouth daily.    . pantoprazole (PROTONIX) 40 MG tablet Take 1 tablet (40 mg total) by mouth daily. PLEASE MAKE APPOINTMENT FOR FURTHER REFILLS 90 tablet 0  . potassium chloride (KLOR-CON) 10 MEQ tablet Take 1 tablet (10 mEq total) by mouth daily. 90 tablet 2  . sacubitril-valsartan (ENTRESTO) 97-103 MG Take 1 tablet by mouth 2 (two) times daily. 180 tablet 3  . spironolactone (ALDACTONE) 25 MG tablet TAKE 0.5 TABLETS BY MOUTH DAILY. 45 tablet 1  . valsartan (DIOVAN) 160 MG  tablet TAKE 1/2 TABLET DAILY FOR ONE WEEK, THEN INCREASE TO 1 WHOLE TABLET DAILY. 90 tablet 3  . furosemide (LASIX) 40 MG tablet Take 1 tablet (40 mg total) by mouth daily. 30 tablet 6   No facility-administered medications prior to visit.     Allergies:   Patient has no known allergies.   Social History   Socioeconomic History  . Marital status: Married    Spouse name:  Not on file  . Number of children: Not on file  . Years of education: Not on file  . Highest education level: Not on file  Occupational History  . Not on file  Tobacco Use  . Smoking status: Former Research scientist (life sciences)  . Smokeless tobacco: Former Network engineer and Sexual Activity  . Alcohol use: Not on file  . Drug use: Not on file  . Sexual activity: Not on file  Other Topics Concern  . Not on file  Social History Narrative  . Not on file   Social Determinants of Health   Financial Resource Strain:   . Difficulty of Paying Living Expenses:   Food Insecurity:   . Worried About Charity fundraiser in the Last Year:   . Arboriculturist in the Last Year:   Transportation Needs:   . Film/video editor (Medical):   Marland Kitchen Lack of Transportation (Non-Medical):   Physical Activity:   . Days of Exercise per Week:   . Minutes of Exercise per Session:   Stress:   . Feeling of Stress :   Social Connections:   . Frequency of Communication with Friends and Family:   . Frequency of Social Gatherings with Friends and Family:   . Attends Religious Services:   . Active Member of Clubs or Organizations:   . Attends Archivist Meetings:   Marland Kitchen Marital Status:      Family History:  The patient's family history is not on file.  His parents are deceased.  He came to [place] in the 1960s from [place].  ROS General: Negative; No fevers, chills, or night sweats;  HEENT: Negative; No changes in vision or hearing, sinus congestion, difficulty swallowing Pulmonary: Negative; No cough, wheezing, shortness of breath,  hemoptysis Cardiovascular: See history of present illness GI: Negative; No nausea, vomiting, diarrhea, or abdominal pain GU: Negative; No dysuria, hematuria, or difficulty voiding Musculoskeletal: Negative; no myalgias, joint pain, or weakness Hematologic/Oncology: Negative; no easy bruising, bleeding Endocrine: Negative; no heat/cold intolerance; no diabetes Neuro: Negative; no changes in balance, headaches Skin: Negative; No rashes or skin lesions Psychiatric: Negative; No behavioral problems, depression Sleep: Negative; No snoring, daytime sleepiness, hypersomnolence, bruxism, restless legs, hypnogognic hallucinations, no cataplexy Other comprehensive 14 point system review is negative.   PHYSICAL EXAM:   VS:  BP 108/62   Pulse 67   Ht 5' 2" (1.575 m)   Wt 118 lb (53.5 kg)   BMI 21.58 kg/m     Repeat blood pressure by me was 98/60  Wt Readings from Last 3 Encounters:  07/23/20 118 lb (53.5 kg)  05/17/20 128 lb (58.1 kg)  02/15/19 126 lb 6.4 oz (57.3 kg)    General: Alert, oriented, no distress.  Skin: normal turgor, no rashes, warm and dry HEENT: Normocephalic, atraumatic. Pupils equal round and reactive to light; sclera anicteric; extraocular muscles intact;  Nose without nasal septal hypertrophy Mouth/Parynx benign; Mallinpatti scale 3 Neck: No JVD, no carotid bruits; normal carotid upstroke Lungs: clear to ausculatation and percussion; no wheezing or rales Chest wall: without tenderness to palpitation Heart: PMI not displaced, RRR, s1 s2 normal, 7-8/2 systolic murmur, no diastolic murmur, no rubs, gallops, thrills, or heaves Abdomen: soft, nontender; no hepatosplenomehaly, BS+; abdominal aorta nontender and not dilated by palpation. Back: no CVA tenderness Pulses 2+ Musculoskeletal: full range of motion, normal strength, no joint deformities Extremities: Resolution of prior 1+ ankle edema; no clubbing cyanosis, Homan's sign negative  Neurologic: grossly nonfocal;  Cranial nerves grossly wnl Psychologic: Normal mood and affect   Studies/Labs Reviewed:   EKG:  EKG is ordered today. ECG (independently read by me): Atrial fibrillation at 67, inferolateral T wave abnormality  June   2021 ECG (independently read by me): Atrial fibrillation with rate in the 80s, PVC, QS complex V1 through V3 lateral T wave abnormality.  February 15, 2019 ECG (independently read by me): Atrial fibrillation at 66 bpm.  Poor anterior R wave progression.  Nonspecific T wave abnormality.  Normal QTc interval at 442 ms.  October 2019 ECG (independently read by me): Atrial fibrillation at 65 bpm.  Inferolateral ST segment changes.  QS complex V1 through V3.  August 30, 2018 ECG (independently read by me): Atrial fibrillation with ventricular rate at 63 bpm.  Inferolateral ST changes.  LVH by voltage  May 2019 ECG (independently read by me): Atrial Fibrillation at 73 bpm.  LVH.  Nonspecific intraventricular conduction delay.  ST-T changes  February 2018 ECG (independently read by me): Atrial fibrillation at 69 bpm.  Incomplete left bundle blanch block.  ST-T changes.  December 2018 ECG (independently read by me):  Probable atrial fibrillation with ventricular rate in the 60s.  LVH with repolarization changes.  QTc interval 464 ms.  September 2018 ECG (independently read by me): Normal sinus rhythm at 64.  First AV block with a PR interval 204 ms.  LVH with repolarization changes.  QTc interval 470 ms.  Recent Labs: BMP Latest Ref Rng & Units 06/06/2020 09/16/2018 08/30/2018  Glucose 65 - 99 mg/dL 82 89 78  BUN 8 - 27 mg/dL _0 Creatinine 0.76 - 1.27 mg/dL 1.49(H) 1.47(H) 1.37(H)  BUN/Creat Ratio 10 - _1 Sodium 134 - 144 mmol/L 138 140 145(H)  Potassium 3.5 - 5.2 mmol/L 4.5 4.6 4.0  Chloride 96 - 106 mmol/L 99 101 103  CO2 20 - 29 mmol/L _2 Calcium 8.6 - 10.2 mg/dL 10.8(H) 10.4(H) 10.1     Hepatic Function Latest Ref Rng & Units 06/06/2020 08/30/2018  12/16/2017  Total Protein 6.0 - 8.5 g/dL 7.1 6.3 7.0  Albumin 3.7 - 4.7 g/dL 4.1 3.9 3.4(L)  AST 0 - 40 IU/L _3 ALT 0 - 44 IU/L _4 Alk Phosphatase 48 - 121 IU/L 96 81 92  Total Bilirubin 0.0 - 1.2 mg/dL 1.2 0.9 0.7    CBC Latest Ref Rng & Units 06/06/2020 12/16/2017 09/10/2017  WBC 3.4 - 10.8 x10E3/uL 7.9 10.4 9.5  Hemoglobin 13.0 - 17.7 g/dL 15.1 14.1 14.3  Hematocrit 37.5 - 51.0 % 44.6 41.2 40.9  Platelets 150 - 450 x10E3/uL 149(L) 223 192   Lab Results  Component Value Date   MCV 95 06/06/2020   MCV 97 12/16/2017   MCV 97 09/10/2017   Lab Results  Component Value Date   TSH 3.260 06/06/2020   Lab Results  Component Value Date   HGBA1C 5.4 03/04/2016     BNP    Component Value Date/Time   BNP 279.6 (H) 06/06/2020 1142   BNP 513.7 (H) 03/02/2016 1421    ProBNP    Component Value Date/Time   PROBNP 3,065 (H) 08/30/2018 1155     Lipid Panel     Component Value Date/Time   CHOL 112 06/06/2020 1142   TRIG 75 06/06/2020 1142   HDL 51 06/06/2020 1142   CHOLHDL 2.2 06/06/2020 1142   CHOLHDL 4.5 03/04/2016 0400   VLDL 13 03/04/2016  0400   LDLCALC 46 06/06/2020 1142     RADIOLOGY: No results found.   Additional studies/ records that were reviewed today include:  I reviewed his catheterization report,Echo Doppler data, hospital records, office visits with Dr. Van [name] and Bryan [name].  03/04/2016.  Cardiac catheterization Conclusion    RPDA lesion, 95% stenosed.  Prox RCA lesion, 80% stenosed.  Dist RCA lesion, 100% stenosed.  LM lesion, 50% stenosed.  Ost 1st Diag to 1st Diag lesion, 50% stenosed.  Prox LAD lesion, 75% stenosed.  1st Mrg lesion, 100% stenosed.  There is moderate left ventricular systolic dysfunction.   Severe multivessel CAD with coronary calcification and 50% stenosis in the proximal LAD and diagonal vessel followed by a napkin ring 75% focal stenosis between the first and second diagonal branch; 50% proximal  circumflex stenosis with total occlusion of the OM1 vessel with retrograde collaterals arising from the LAD; and total occlusion of the mid RCA with 95% stenosis in the acute margin branch, and evidence for collaterals to the distal RCA via the distal circumflex and distal LAD.  Mild to moderate elevation of right heart pressures.  Moderately severe LV dysfunction with more pronounced inferior hypocontractility and EF of 35%  consistent with an ischemic cardiomyopathy.  RECOMMENDATION: Surgical consultation for consideration of CABG revascularization surgery.      ------------------------------------------------------------------- 03/03/2016 ECHO Study Conclusions  - Left ventricle: The cavity size was normal. There was mild   concentric hypertrophy. Systolic function was moderately reduced.   The estimated ejection fraction was in the range of 35% to 40%.   Diffuse hypokinesis with akinesis of the basal and mid   inferolateral walls. Features are consistent with a pseudonormal   left ventricular filling pattern, with concomitant abnormal   relaxation and increased filling pressure (grade 2 diastolic   dysfunction). Doppler parameters are consistent with elevated   ventricular end-diastolic filling pressure. - Aortic valve: Structurally normal valve. There was mild   regurgitation. - Aorta: The aorta was normal, not dilated, and non-diseased. - Mitral valve: Structurally normal valve. There was moderate   regurgitation. - Left atrium: The atrium was mildly dilated. - Right ventricle: The cavity size was normal. Wall thickness was   normal. Systolic function was normal. - Right atrium: The atrium was normal in size. - Tricuspid valve: There was mild regurgitation. - Pulmonary arteries: Systolic pressure was mildly increased. PA   peak pressure: 38 mm Hg (S). - Pericardium, extracardiac: The pericardium was normal in   appearance. There was a left pleural  effusion.   ASSESSMENT:    1. Mild aortic insufficiency   2. Coronary artery disease involving coronary bypass graft of native heart without angina pectoris   3. Ischemic cardiomyopathy   4. S/P CABG x 4   5. Persistent atrial fibrillation (HCC)   6. Nonrheumatic mitral valve regurgitation   7. Hyperlipidemia LDL goal <70     PLAN:  [name] [name] is an 79 year-old gentleman who is originally from [place].  He was found to have an ischemic cardiomyopathy with accelerated hypertension and multivessel CAD in March 2017.  He underwent  CABG revascularization surgery 4.  He developed postoperative atrial fibrillation and also developed a subsequent left pleural effusion which ultimately required thoracentesis.  He underwent cardioversion for atrial fibrillation in May 2017.  Apparently, he had been on eliquis at that time, but developed significant nose bleeding and subsequently has not been on anticoagulation.  When I saw him for my initial evaluation in December   2018 following his hospitalization I discussed resumption of anticoagulation.  He was adamant that he would not start this.  As result, he has been on aspirin and Plavix.  He is aware of risk of thromboembolism function and continues to have permanent atrial fibrillation.  He is no longer on amiodarone.  He had been titrated to optimal medical therapy with Entresto 97 103 mg twice a day but unfortunately due to cost when he was last seen he had stopped treatment and also had run out of carvedilol.  He is now back on valsartan and doing well at 160 mg daily.  He continues to take spironolactone 12.5 mg.  He has been taking furosemide 40 mg and is now back on carvedilol 12.5 mg twice a day.  His blood pressure today is low.  His previous edema has resolved.  I have recommended he reduce his furosemide from 40 mg down to 20 mg daily.  He continues to be on aspirin/Plavix due to his resistance to reinstitute anticoagulation therapy.  His atrial  fibrillation is permanent and currently rate controlled now back on carvedilol with pulse today in the upper 60s.  I reviewed his most recent echo Doppler study from June 07, 2020 which showed an EF at 45 to 50% with inferior to inferolateral hypokinesis in the mid to basal segment.  He had severe biatrial enlargement and there was evidence for at least moderate to moderately severe mitral regurgitation as well as mild aortic insufficiency.  Ascending aorta was mildly increased at 39 mm.  Presently he will continue current therapy.  He will monitor his blood pressure.  I will see him in 4 months for reevaluation or sooner as needed.    Medication Adjustments/Labs and Tests Ordered: Current medicines are reviewed at length with the patient today.  Concerns regarding medicines are outlined above.  Medication changes, Labs and Tests ordered today are listed in the Patient Instructions below. Patient Instructions  Medication Instructions:  DECREASE YOUR LASIX TO 20MG DAILY  *If you need a refill on your cardiac medications before your next appointment, please call your pharmacy*     Follow-Up: At [facility], you and your health needs are our priority.  As part of our continuing mission to provide you with exceptional heart care, we have created designated Provider Care Teams.  These Care Teams include your primary Cardiologist (physician) and Advanced Practice Providers (APPs -  Physician Assistants and Nurse Practitioners) who all work together to provide you with the care you need, when you need it.  We recommend signing up for the patient portal called "MyChart".  Sign up information is provided on this After Visit Summary.  MyChart is used to connect with patients for Virtual Visits (Telemedicine).  Patients are able to view lab/test results, encounter notes, upcoming appointments, etc.  Non-urgent messages can be sent to your provider as well.   To learn more about what you can do with  MyChart, go to [email or web address].    Your next appointment:   4 month(s)  The format for your next appointment:   In Person  Provider:   Thomas Kelly, MD      Time spent: 25 minutes Signed, [name] Kelly, MD  07/25/2020 2:14 PM    [facility] [address], [address], [place], [place]  [address] Phone: [phone]    

## 2020-07-23 NOTE — Patient Instructions (Signed)
Medication Instructions:  DECREASE YOUR LASIX TO 20MG  DAILY  *If you need a refill on your cardiac medications before your next appointment, please call your pharmacy*     Follow-Up: At [facility], you and your health needs are our priority.  As part of our continuing mission to provide you with exceptional heart care, we have created designated Provider Care Teams.  These Care Teams include your primary Cardiologist (physician) and Advanced Practice Providers (APPs -  Physician Assistants and Nurse Practitioners) who all work together to provide you with the care you need, when you need it.  We recommend signing up for the patient portal called "MyChart".  Sign up information is provided on this After Visit Summary.  MyChart is used to connect with patients for Virtual Visits (Telemedicine).  Patients are able to view lab/test results, encounter notes, upcoming appointments, etc.  Non-urgent messages can be sent to your provider as well.   To learn more about what you can do with MyChart, go to [facility].    Your next appointment:   4 month(s)  The format for your next appointment:   In Person  Provider:   [email or web address], MD

## 2020-07-25 ENCOUNTER — Encounter: Payer: Self-pay | Admitting: Cardiovascular Disease

## 2020-11-06 ENCOUNTER — Other Ambulatory Visit: Payer: Self-pay | Admitting: Cardiovascular Disease

## 2020-11-25 ENCOUNTER — Other Ambulatory Visit: Payer: Self-pay | Admitting: Cardiovascular Disease

## 2020-11-25 ENCOUNTER — Other Ambulatory Visit: Payer: Self-pay

## 2020-11-25 ENCOUNTER — Encounter: Payer: Self-pay | Admitting: Cardiovascular Disease

## 2020-11-25 ENCOUNTER — Ambulatory Visit (INDEPENDENT_AMBULATORY_CARE_PROVIDER_SITE_OTHER): Payer: Medicare HMO | Admitting: Cardiovascular Disease

## 2020-11-25 VITALS — BP 152/78 | HR 81 | Ht 62.0 in | Wt 131.2 lb

## 2020-11-25 DIAGNOSIS — Z951 Presence of aortocoronary bypass graft: Secondary | ICD-10-CM | POA: Diagnosis not present

## 2020-11-25 DIAGNOSIS — I340 Nonrheumatic mitral (valve) insufficiency: Secondary | ICD-10-CM

## 2020-11-25 DIAGNOSIS — I255 Ischemic cardiomyopathy: Secondary | ICD-10-CM

## 2020-11-25 DIAGNOSIS — I4819 Other persistent atrial fibrillation: Secondary | ICD-10-CM

## 2020-11-25 DIAGNOSIS — E785 Hyperlipidemia, unspecified: Secondary | ICD-10-CM

## 2020-11-25 DIAGNOSIS — I351 Nonrheumatic aortic (valve) insufficiency: Secondary | ICD-10-CM | POA: Diagnosis not present

## 2020-11-25 DIAGNOSIS — I10 Essential (primary) hypertension: Secondary | ICD-10-CM | POA: Diagnosis not present

## 2020-11-25 DIAGNOSIS — I25810 Atherosclerosis of coronary artery bypass graft(s) without angina pectoris: Secondary | ICD-10-CM | POA: Diagnosis not present

## 2020-11-25 MED ORDER — SPIRONOLACTONE 25 MG PO TABS: 12.5000 mg | ORAL_TABLET | Freq: Two times a day (BID) | ORAL | 3 refills | Status: DC

## 2020-11-25 MED ORDER — CARVEDILOL 25 MG PO TABS: 25.0000 mg | ORAL_TABLET | Freq: Two times a day (BID) | ORAL | 3 refills | Status: DC

## 2020-11-25 NOTE — Patient Instructions (Signed)
Medication Instructions:  STOP your potassium supplement INCREASE your spironolactone to 12.5 mg (half a pill) TWICE A DAY *If you need a refill on your cardiac medications before your next appointment, please call your pharmacy*   Lab Work: CMET and BNP in 2 weeks If you have labs (blood work) drawn today and your tests are completely normal, you will receive your results only by: Marland Kitchen MyChart Message (if you have MyChart) OR . A paper copy in the mail If you have any lab test that is abnormal or we need to change your treatment, we will call you to review the results.   Testing/Procedures: None ordered   Follow-Up: At [facility], you and your health needs are our priority.  As part of our continuing mission to provide you with exceptional heart care, we have created designated Provider Care Teams.  These Care Teams include your primary Cardiologist (physician) and Advanced Practice Providers (APPs -  Physician Assistants and Nurse Practitioners) who all work together to provide you with the care you need, when you need it.  We recommend signing up for the patient portal called "MyChart".  Sign up information is provided on this After Visit Summary.  MyChart is used to connect with patients for Virtual Visits (Telemedicine).  Patients are able to view lab/test results, encounter notes, upcoming appointments, etc.  Non-urgent messages can be sent to your provider as well.   To learn more about what you can do with MyChart, go to [email or web address].    Your next appointment:   3 month(s)  The format for your next appointment:   In Person  Provider:   Nicki Guadalajara, MD   Other Instructions Purchase some below-the-knee compression stockings (10-20 mmHg) to control lower extremity swelling

## 2020-11-25 NOTE — Progress Notes (Signed)
Cardiology Office Note    Date:  11/26/2020   ID:  [name] [name], DOB 05-31-1941, MRN [id number]  PCP:  [name] Melter, MD  Cardiologist:  [name] Majestic, MD   F/U office evaluation:  Initial office evaluation 11/19/2017  History of Present Illness:  [name] [name] is a 79 y.o. male  who presents for a 4 month follow-up cardiology evaluation.  [name] [name] is originally from [place].  In March 2017 he presented with accelerated hypertension and increasing shortness of breath.  An echo Doppler study showed an EF of 35%.  He was referred for right and left heart catheterization which was done by me on 03/04/2016.  This revealed severe multivessel CAD with coronary calcification with 50% stenosis in the proximal LAD and diagonal, followed by a napkin ring 75% focal stenosis between the first and second diagonal branch.  There was 50% proximal circumflex stenosis with total occlusion of the OM1 vessel with retrograde collaterals arising from the LAD.  The RCA was totally occluded in its mid segment and there was a 95% stenosis in the acute marginal branch.  There was collaterals to the distal RCA via the distal circumflex and distal LAD.  He did, mild to moderate elevation of right heart pressures.  EF was 35%, consistent with ischemic heart myopathy.  He underwent CABGx4 revascularization surgery by Dr. Tharon Aquas [name] with a LIMA to LAD, SVG to OM, SVG to PDA, and SVG to diagonal.  He also underwent clipping of the left atrial appendage and left carotid endarterectomy.  He had developed atrial fibrillation postoperatively.  In addition, he developed a pleural effusion which required thoracentesis by Dr. Nils [name] in the outpatient setting.  He saw [name] [name] for initial outpatient follow-up in April 2017.  On 04/16/2016 he underwent cardioversion with restoration of sinus rhythm.  He has not been seen by cardiology since.  I had not seen him since his original hospitalization until he presented to my  office in September 2018.  At that time, he denied any recurrent anginal symptoms or significant dyspnea or palpitations.  He was on amiodarone 200 mg daily, carvedilol 3.125 mg twice a day and was unaware of recurrent atrial fibrillation.  He also has been taking Lasix 40 mg daily, Protonix for GERD and atorvastatin 80 mg for hyperlipidemia.    When I initially saw him, his physical examination suggested a significant murmur of mitral regurgitation.  I scheduled him for 2-D echo Doppler study, and also discussed potential future ARNI therapy.  An echo Doppler study on 09/17/2017 showed further depression of LV function with an EF of 25-30% with diffuse hypocontractility and akinesis of the inferolateral wall.  He had grade 3 diastolic dysfunction.  There was moderate mitral regurgitation, severe LA dilation, moderate RA dilation, and moderate aortic insufficiency.  PA pressure was significant increased at 85 mm.  Laboratory and show normal thyroid function.  He was not anemic.  He had stable renal function and LFTs.  Lipid studies revealed a total cholesterol 124, triglycerides 60, HDL 56, and LDL 56.   When I saw him on 11/19/2017, I discontinued valsartan and started him on Entresto 24/26 twice a day.  I also discussed reinstitution with anticoagulation which he was adamant that he would not do.  As result, I added Plavix 75 mg daily in addition to his aspirin 81 mg.  We also discussed potential need for future ICD implantation.  Over the past 2 months, his breathing has improved.  He still notes  some mild ankle swelling bilaterally.  He denies palpitations.  Follow-up laboratory in 12/16/2017 showed a hemoglobin of 14.1, hematocrit 41.2.  ProBNP was elevated at 3754.  Creatinine was stable at 1.26.  He was seen by [name] [name] in our office on 12/16/2017.  Carvedilol was increased to 6.25 mg twice a day.  She is working on the [employer] patient assistance program.   In February 2019, I titrated Entresto to  49/51 mg twice a day.  Repeat blood work March 2019 showed a creatinine that was stable at 1.21.  ProBNP was still elevated but improved at 2643.  At that time he continued to experience mild peripheral edema.  When I last saw him in May 2019 he was feeling significantly improved on the increase Entresto regimen.  He has been in atrial fibrillation and during that evaluation I again had a thorough discussion with him regarding increased risk for thromboembolism without anticoagulation therapy and he again reiterated that he had no intention to initiate anticoagulation.  During his evaluation I titrated Entresto to optimal dosing at 97/103 mg twice a day.  I recommended he continue his current dose of furosemide at 40 mg.  Over the past several months, he has continued to feel well.  He feels that [name] [name] has made a significant improvement in his sense of well-being.   I saw him on August 30, 2018.  At that time I again had further discussion regarding his anticoagulation and he remained steadfast in his decision not to initiate and was aware of potential thromboembolic risk.  At that time I recommended he reduce amiodarone to 100 mg daily and discussed ultimate plans to discontinue altogether and further titrate carvedilol.   I last saw him on October 10, 2018.  He continues to have atrial fibrillation with ventricular rate controlled in the 60s.  At that time I discontinued amiodarone since there were no plans for cardioversion without anticoagulation.  Recommended further titration of carvedilol to 18.7 5 in the morning and 12.5 mg at night.  I scheduled him for a follow-up echo Doppler study which was done on November 14, 2018.  This showed an EF of 30 to 35%.  There was mitral annular calcification.  There was now moderately severe MR and moderately severe LA dilation.  PA pressure was increased at 47 mm.  [name] [name] last saw [name] [name], [place] on November 17, 2018.  Over the last several months, he has  continued to feel well.  However, he is concerned about the cost of his Entresto.  He has been titrated up to maximum 97/103 mg twice daily.   I saw him in March 2020 at which time he was doing well on Entresto 97/103 mg twice a day and was asymptomatic with reference to shortness of breath.    I  saw him in June 2021 and since his March evaluation due to cost issues he stopped taking his Entresto.  We had tried to get additional approval from the foundation.  Apparently this was denied.  He had not seen me over the year despite being off treatment.  Over the last 5 days he also ran out of his carvedilol which had been 25 mg twice a day.  Presently he denies significant shortness of breath.  However his blood pressure has increased.  He also does note some occasional ankle swelling.  During that evaluation, I reinitiated treatment with carvedilol 12.5 mg twice a day since his resting pulse was 97 and discuss further titration  to 25 mg twice a day thereafter.  Since he was no longer on Entresto, I reinitiated valsartan alone and recommended he take 80 mg for the first week with titration up to 160 mg daily.  I last saw him in August 2021 and over the prior several months he had felt improved with reinstitution of valsartan and carvedilol.  He denied any chest pain, PND orthopnea.  There was no dizziness.  He was on carvedilol 25 mg twice a day, spironolactone 12.5 mg, valsartan 160 mg in addition to furosemide 40 mg.  Presently, he continues to feel well.  However he has noticed leg edema right leg greater than left.  He denies any shortness of breath.  He is not aware of any cardiac arrhythmias.  BNP level in June 2021 was 279.  He presents for evaluation.  Past Medical History:  Diagnosis Date  . Atrial fibrillation ([place]) 03/21/2016    Past Surgical History:  Procedure Laterality Date  . CARDIAC CATHETERIZATION N/A 03/04/2016   Procedure: Right/Left Heart Cath and Coronary Angiography;  Surgeon:  Troy Sine, MD;  Location: [place] [facility] CV LAB;  Service: Cardiovascular;  Laterality: N/A;  . CARDIOVERSION N/A 04/16/2016   Procedure: CARDIOVERSION;  Surgeon: Jerline Pain, MD;  Location: [facility] ENDOSCOPY;  Service: Cardiovascular;  Laterality: N/A;  . CLIPPING OF ATRIAL APPENDAGE Left 03/10/2016   Procedure: CLIPPING OF ATRIAL APPENDAGE;  Surgeon: Ivin Poot, MD;  Location: [place];  Service: Open Heart Surgery;  Laterality: Left;  . CORONARY ARTERY BYPASS GRAFT N/A 03/10/2016   Procedure: CORONARY ARTERY BYPASS GRAFTING (CABG)TIMES 4 USING LEFT INTERNAL MAMMARY AND BILATERAL GREATER SAPHENOUS VEIN HARVESTED BY ENDOVIEN;  Surgeon: Ivin Poot, MD;  Location: [place];  Service: Open Heart Surgery;  Laterality: N/A;  . ENDARTERECTOMY Left 03/10/2016   Procedure: LEFT ENDARTERECTOMY CAROTID DACRON PATCH ANGIOPLASTY;  Surgeon: Elam Dutch, MD;  Location: [place];  Service: Vascular;  Laterality: Left;  . TEE WITHOUT CARDIOVERSION N/A 03/10/2016   Procedure: TRANSESOPHAGEAL ECHOCARDIOGRAM (TEE);  Surgeon: Ivin Poot, MD;  Location: [place];  Service: Open Heart Surgery;  Laterality: N/A;    Current Medications: Outpatient Medications Prior to Visit  Medication Sig Dispense Refill  . Ascorbic Acid (VITAMIN C) 1000 MG tablet Take 1,000 mg by mouth daily.    Marland Kitchen aspirin 81 MG tablet Take 81 mg by mouth every Monday, Wednesday, and Friday.    Marland Kitchen atorvastatin (LIPITOR) 80 MG tablet Take 1 tablet (80 mg total) by mouth daily at 6 PM. 90 tablet 3  . clopidogrel (PLAVIX) 75 MG tablet TAKE 1 TABLET BY MOUTH EVERY DAY 90 tablet 1  . furosemide (LASIX) 20 MG tablet Take 1 tablet (20 mg total) by mouth daily. 90 tablet 3  . magnesium 30 MG tablet Take 30 mg by mouth daily.    . pantoprazole (PROTONIX) 40 MG tablet Take 1 tablet (40 mg total) by mouth daily. PLEASE MAKE APPOINTMENT FOR FURTHER REFILLS 90 tablet 0  . valsartan (DIOVAN) 160 MG tablet TAKE 1/2 TABLET DAILY FOR ONE WEEK, THEN INCREASE TO 1  WHOLE TABLET DAILY. 90 tablet 3  . carvedilol (COREG) 25 MG tablet TAKE 1/2 TABLET TWICE A DAY FOR 4 DAYS, THEN INCREASE TO 1 WHOLE TABLET TWICE A DAY 180 tablet 3  . potassium chloride (KLOR-CON) 10 MEQ tablet Take 1 tablet (10 mEq total) by mouth daily. 90 tablet 2  . sacubitril-valsartan (ENTRESTO) 97-103 MG Take 1 tablet by mouth 2 (two) times  daily. 180 tablet 3  . spironolactone (ALDACTONE) 25 MG tablet TAKE 0.5 TABLETS BY MOUTH DAILY. 45 tablet 1   No facility-administered medications prior to visit.     Allergies:   Patient has no known allergies.   Social History   Socioeconomic History  . Marital status: Married    Spouse name: Not on file  . Number of children: Not on file  . Years of education: Not on file  . Highest education level: Not on file  Occupational History  . Not on file  Tobacco Use  . Smoking status: Former Research scientist (life sciences)  . Smokeless tobacco: Former Network engineer and Sexual Activity  . Alcohol use: Not on file  . Drug use: Not on file  . Sexual activity: Not on file  Other Topics Concern  . Not on file  Social History Narrative  . Not on file   Social Determinants of Health   Financial Resource Strain: Not on file  Food Insecurity: Not on file  Transportation Needs: Not on file  Physical Activity: Not on file  Stress: Not on file  Social Connections: Not on file     Family History:  The patient's family history is not on file.  His parents are deceased.  He came to [place] in the 1960s from [place].  ROS General: Negative; No fevers, chills, or night sweats;  HEENT: Negative; No changes in vision or hearing, sinus congestion, difficulty swallowing Pulmonary: Negative; No cough, wheezing, shortness of breath, hemoptysis Cardiovascular: See history of present illness GI: Negative; No nausea, vomiting, diarrhea, or abdominal pain GU: Negative; No dysuria, hematuria, or difficulty voiding Musculoskeletal: Negative; no myalgias, joint pain, or  weakness Hematologic/Oncology: Negative; no easy bruising, bleeding Endocrine: Negative; no heat/cold intolerance; no diabetes Neuro: Negative; no changes in balance, headaches Skin: Negative; No rashes or skin lesions Psychiatric: Negative; No behavioral problems, depression Sleep: Negative; No snoring, daytime sleepiness, hypersomnolence, bruxism, restless legs, hypnogognic hallucinations, no cataplexy Other comprehensive 14 point system review is negative.   PHYSICAL EXAM:   VS:  BP (!) 152/78   Pulse 81   Ht '5\' 2"'  (1.575 m)   Wt 131 lb 3.2 oz (59.5 kg)   SpO2 97%   BMI 24.00 kg/m     Repeat blood pressure by me was 140/76  Wt Readings from Last 3 Encounters:  11/25/20 131 lb 3.2 oz (59.5 kg)  07/23/20 118 lb (53.5 kg)  05/17/20 128 lb (58.1 kg)    General: Alert, oriented, no distress.  Skin: normal turgor, no rashes, warm and dry HEENT: Normocephalic, atraumatic. Pupils equal round and reactive to light; sclera anicteric; extraocular muscles intact;  Nose without nasal septal hypertrophy Mouth/Parynx benign; Mallinpatti scale 3 Neck: No JVD, no carotid bruits; normal carotid upstroke Lungs: clear to ausculatation and percussion; no wheezing or rales Chest wall: without tenderness to palpitation Heart: PMI not displaced, irregularly irregular with rate control, s1 s2 normal, 3-2/3 systolic murmur, no diastolic murmur, no rubs, gallops, thrills, or heaves Abdomen: soft, nontender; no hepatosplenomehaly, BS+; abdominal aorta nontender and not dilated by palpation. Back: no CVA tenderness Pulses 2+ Musculoskeletal: full range of motion, normal strength, no joint deformities Extremities: 1+ left and 2+ right lower extremity edema; no clubbing cyanosis, Homan's sign negative  Neurologic: grossly nonfocal; Cranial nerves grossly wnl Psychologic: Normal mood and affect   Studies/Labs Reviewed:   EKG:  EKG is ordered today. ECG (independently read by me): Atrial fibrillation  at 81  QTc 443 msec  August 2021 ECG (independently read by me): Atrial fibrillation at 67, inferolateral T wave abnormality  June   2021 ECG (independently read by me): Atrial fibrillation with rate in the 80s, PVC, QS complex V1 through V3 lateral T wave abnormality.  February 15, 2019 ECG (independently read by me): Atrial fibrillation at 66 bpm.  Poor anterior R wave progression.  Nonspecific T wave abnormality.  Normal QTc interval at 442 ms.  October 2019 ECG (independently read by me): Atrial fibrillation at 65 bpm.  Inferolateral ST segment changes.  QS complex V1 through V3.  August 30, 2018 ECG (independently read by me): Atrial fibrillation with ventricular rate at 63 bpm.  Inferolateral ST changes.  LVH by voltage  May 2019 ECG (independently read by me): Atrial Fibrillation at 73 bpm.  LVH.  Nonspecific intraventricular conduction delay.  ST-T changes  February 2018 ECG (independently read by me): Atrial fibrillation at 69 bpm.  Incomplete left bundle blanch block.  ST-T changes.  December 2018 ECG (independently read by me):  Probable atrial fibrillation with ventricular rate in the 60s.  LVH with repolarization changes.  QTc interval 464 ms.  September 2018 ECG (independently read by me): Normal sinus rhythm at 64.  First AV block with a PR interval 204 ms.  LVH with repolarization changes.  QTc interval 470 ms.  Recent Labs: BMP Latest Ref Rng & Units 06/06/2020 09/16/2018 08/30/2018  Glucose 65 - 99 mg/dL 82 89 78  BUN 8 - 27 mg/dL '24 25 21  ' Creatinine 0.76 - 1.27 mg/dL 1.49(H) 1.47(H) 1.37(H)  BUN/Creat Ratio 10 - '24 16 17 15  ' Sodium 134 - 144 mmol/L 138 140 145(H)  Potassium 3.5 - 5.2 mmol/L 4.5 4.6 4.0  Chloride 96 - 106 mmol/L 99 101 103  CO2 20 - 29 mmol/L '26 26 28  ' Calcium 8.6 - 10.2 mg/dL 10.8(H) 10.4(H) 10.1     Hepatic Function Latest Ref Rng & Units 06/06/2020 08/30/2018 12/16/2017  Total Protein 6.0 - 8.5 g/dL 7.1 6.3 7.0  Albumin 3.7 - 4.7 g/dL 4.1 3.9 3.4(L)   AST 0 - 40 IU/L '15 18 23  ' ALT 0 - 44 IU/L '16 23 27  ' Alk Phosphatase 48 - 121 IU/L 96 81 92  Total Bilirubin 0.0 - 1.2 mg/dL 1.2 0.9 0.7    CBC Latest Ref Rng & Units 06/06/2020 12/16/2017 09/10/2017  WBC 3.4 - 10.8 x10E3/uL 7.9 10.4 9.5  Hemoglobin 13.0 - 17.7 g/dL 15.1 14.1 14.3  Hematocrit 37.5 - 51.0 % 44.6 41.2 40.9  Platelets 150 - 450 x10E3/uL 149(L) 223 192   Lab Results  Component Value Date   MCV 95 06/06/2020   MCV 97 12/16/2017   MCV 97 09/10/2017   Lab Results  Component Value Date   TSH 3.260 06/06/2020   Lab Results  Component Value Date   HGBA1C 5.4 03/04/2016     BNP    Component Value Date/Time   BNP 279.6 (H) 06/06/2020 1142   BNP 513.7 (H) 03/02/2016 1421    ProBNP    Component Value Date/Time   PROBNP 3,065 (H) 08/30/2018 1155     Lipid Panel     Component Value Date/Time   CHOL 112 06/06/2020 1142   TRIG 75 06/06/2020 1142   HDL 51 06/06/2020 1142   CHOLHDL 2.2 06/06/2020 1142   CHOLHDL 4.5 03/04/2016 0400   VLDL 13 03/04/2016 0400   LDLCALC 46 06/06/2020 1142     RADIOLOGY: No results found.   Additional studies/  records that were reviewed today include:  I reviewed his catheterization report,Echo Doppler data, hospital records, office visits with Dr. Nils [name] and Tarri Fuller.  03/04/2016.  Cardiac catheterization Conclusion    RPDA lesion, 95% stenosed.  Prox RCA lesion, 80% stenosed.  Dist RCA lesion, 100% stenosed.  LM lesion, 50% stenosed.  Ost 1st Diag to 1st Diag lesion, 50% stenosed.  Prox LAD lesion, 75% stenosed.  1st Mrg lesion, 100% stenosed.  There is moderate left ventricular systolic dysfunction.   Severe multivessel CAD with coronary calcification and 50% stenosis in the proximal LAD and diagonal vessel followed by a napkin ring 75% focal stenosis between the first and second diagonal branch; 50% proximal circumflex stenosis with total occlusion of the OM1 vessel with retrograde collaterals arising  from the LAD; and total occlusion of the mid RCA with 95% stenosis in the acute margin branch, and evidence for collaterals to the distal RCA via the distal circumflex and distal LAD.  Mild to moderate elevation of right heart pressures.  Moderately severe LV dysfunction with more pronounced inferior hypocontractility and EF of 35%  consistent with an ischemic cardiomyopathy.  RECOMMENDATION: Surgical consultation for consideration of CABG revascularization surgery.      ------------------------------------------------------------------- 03/03/2016 ECHO Study Conclusions  - Left ventricle: The cavity size was normal. There was mild   concentric hypertrophy. Systolic function was moderately reduced.   The estimated ejection fraction was in the range of 35% to 40%.   Diffuse hypokinesis with akinesis of the basal and mid   inferolateral walls. Features are consistent with a pseudonormal   left ventricular filling pattern, with concomitant abnormal   relaxation and increased filling pressure (grade 2 diastolic   dysfunction). Doppler parameters are consistent with elevated   ventricular end-diastolic filling pressure. - Aortic valve: Structurally normal valve. There was mild   regurgitation. - Aorta: The aorta was normal, not dilated, and non-diseased. - Mitral valve: Structurally normal valve. There was moderate   regurgitation. - Left atrium: The atrium was mildly dilated. - Right ventricle: The cavity size was normal. Wall thickness was   normal. Systolic function was normal. - Right atrium: The atrium was normal in size. - Tricuspid valve: There was mild regurgitation. - Pulmonary arteries: Systolic pressure was mildly increased. PA   peak pressure: 38 mm Hg (S). - Pericardium, extracardiac: The pericardium was normal in   appearance. There was a left pleural effusion.   ASSESSMENT:    1. Coronary artery disease involving coronary bypass graft of native heart without  angina pectoris   2. S/P CABG x 4   3. Ischemic cardiomyopathy   4. Essential hypertension   5. Persistent atrial fibrillation ([place])   6. Nonrheumatic mitral valve regurgitation   7. Mild aortic insufficiency   8. Hyperlipidemia LDL goal <70     PLAN:  [name] [name] is an 79 year-old gentleman who is originally from [place].  He was found to have an ischemic cardiomyopathy with accelerated hypertension and multivessel CAD in March 2017.  He underwent CABG revascularization surgery 4.  He developed postoperative atrial fibrillation and also developed a subsequent left pleural effusion which ultimately required thoracentesis.  He underwent cardioversion for atrial fibrillation in May 2017.  Apparently, he had been on eliquis at that time, but developed significant nose bleeding and subsequently has not been on anticoagulation.  When I saw him for my initial evaluation in December 2018 following his hospitalization I discussed resumption of anticoagulation.  He was adamant that he  would not start this.  As result, he has been on aspirin and Plavix.  He is aware of risk of thromboembolism function and continues to have permanent atrial fibrillation.  He is no longer on amiodarone.  He had been titrated to optimal medical therapy with Entresto 97/103 mg twice a day but unfortunately due to cost when he had stopped treatment and also had run out of carvedilol.  He is now back on valsartan and doing well at 160 mg daily.  He continues to take spironolactone 12.5 mg.  In addition to carvedilol which was titrated back to 25 mg twice a day.  He was last seen due to low blood pressure his furosemide dose was reduced.  He now has lower extremity edema 1+ on the left and 2+ on the right.  I am recommending titration of spironolactone and will increase this to 12.5 mg twice a day.  I have recommended he discontinue supplemental potassium.  I also have recommended support stockings to initiate at least 10 to 79m of  pressure.  He continues to be on atorvastatin 80 mg for hyperlipidemia with target LDL less than 70.  DL cholesterol in June 2021 was 46.  I will check a comprehensive metabolic panel as well as BNP in 2 weeks.  I will see him in 3 to 4 months for follow-up evaluation or sooner as needed.  On his last echo in June 2021 EF was 45 to 50% with inferior to inferolateral hypokinesis in the mid to basal segment.  There was severe biatrial enlargement and moderate to moderately severe MR as well as mild AR.  The ascending aorta was mildly increased at 39 mm.  I will see him in the office in 3 to 4 months for reevaluation or sooner as needed.  Medication Adjustments/Labs and Tests Ordered: Current medicines are reviewed at length with the patient today.  Concerns regarding medicines are outlined above.  Medication changes, Labs and Tests ordered today are listed in the Patient Instructions below. Patient Instructions  Medication Instructions:  STOP your potassium supplement INCREASE your spironolactone to 12.5 mg (half a pill) TWICE A DAY *If you need a refill on your cardiac medications before your next appointment, please call your pharmacy*   Lab Work: CMET and BNP in 2 weeks If you have labs (blood work) drawn today and your tests are completely normal, you will receive your results only by: .Marland KitchenMyChart Message (if you have MyChart) OR . A paper copy in the mail If you have any lab test that is abnormal or we need to change your treatment, we will call you to review the results.   Testing/Procedures: None ordered   Follow-Up: [facility] and your [facility] needs are our [facility].  As part of our continuing mission to provide you with exceptional heart care, we have created designated Provider Care Teams.  These Care Teams include your primary Cardiologist (physician) and Advanced Practice Providers (APPs -  Physician Assistants and Nurse Practitioners) who all work together to provide you  with the care you need, when you need it.  We recommend signing up for the patient portal called "MyChart".  Sign up information is provided on this After Visit Summary.  MyChart is used to connect with patients for Virtual Visits (Telemedicine).  Patients are able to view lab/test results, encounter notes, upcoming appointments, etc.  Non-urgent messages can be sent to your provider as well.   To learn more about what you can do  with MyChart, go to [email or web address].    Your next appointment:   3 month(s)  The format for your next appointment:   In Person  Provider:   Shelva Majestic, MD   Other Instructions Purchase some below-the-knee compression stockings (10-20 mmHg) to control lower extremity swelling   Time spent: 25 minutes Signed, [name] Majestic, MD  11/26/2020 4:44 PM    [facility] [address] [place], [place], [place]  [address] Phone: [phone]

## 2020-11-26 ENCOUNTER — Encounter: Payer: Self-pay | Admitting: Cardiovascular Disease

## 2020-12-24 DIAGNOSIS — Z951 Presence of aortocoronary bypass graft: Secondary | ICD-10-CM | POA: Diagnosis not present

## 2020-12-24 DIAGNOSIS — I255 Ischemic cardiomyopathy: Secondary | ICD-10-CM | POA: Diagnosis not present

## 2020-12-24 DIAGNOSIS — I25810 Atherosclerosis of coronary artery bypass graft(s) without angina pectoris: Secondary | ICD-10-CM | POA: Diagnosis not present

## 2020-12-24 DIAGNOSIS — I4819 Other persistent atrial fibrillation: Secondary | ICD-10-CM | POA: Diagnosis not present

## 2020-12-24 DIAGNOSIS — I340 Nonrheumatic mitral (valve) insufficiency: Secondary | ICD-10-CM | POA: Diagnosis not present

## 2020-12-24 DIAGNOSIS — I10 Essential (primary) hypertension: Secondary | ICD-10-CM | POA: Diagnosis not present

## 2020-12-25 LAB — COMPREHENSIVE METABOLIC PANEL
ALT: 15 IU/L (ref 0–44)
AST: 17 IU/L (ref 0–40)
Albumin/Globulin Ratio: 1.7 (ref 1.2–2.2)
Albumin: 4 g/dL (ref 3.7–4.7)
Alkaline Phosphatase: 92 IU/L (ref 44–121)
BUN/Creatinine Ratio: 13 (ref 10–24)
BUN: 15 mg/dL (ref 8–27)
Bilirubin Total: 1.7 mg/dL — ABNORMAL HIGH (ref 0.0–1.2)
CO2: 23 mmol/L (ref 20–29)
Calcium: 11.1 mg/dL — ABNORMAL HIGH (ref 8.6–10.2)
Chloride: 109 mmol/L — ABNORMAL HIGH (ref 96–106)
Creatinine, Ser: 1.16 mg/dL (ref 0.76–1.27)
GFR calc Af Amer: 68 mL/min/{1.73_m2} (ref >59)
GFR calc non Af Amer: 59 mL/min/{1.73_m2} — ABNORMAL LOW (ref >59)
Globulin, Total: 2.4 g/dL (ref 1.5–4.5)
Glucose: 95 mg/dL (ref 65–99)
Potassium: 4.1 mmol/L (ref 3.5–5.2)
Sodium: 146 mmol/L — ABNORMAL HIGH (ref 134–144)
Total Protein: 6.4 g/dL (ref 6.0–8.5)

## 2020-12-25 LAB — BRAIN NATRIURETIC PEPTIDE: BNP: 761.2 pg/mL — ABNORMAL HIGH (ref 0.0–100.0)

## 2021-03-21 ENCOUNTER — Ambulatory Visit: Payer: Medicare HMO | Admitting: Cardiovascular Disease

## 2021-07-01 ENCOUNTER — Other Ambulatory Visit: Payer: Self-pay | Admitting: Cardiovascular Disease

## 2022-03-16 DIAGNOSIS — Z951 Presence of aortocoronary bypass graft: Secondary | ICD-10-CM | POA: Diagnosis not present

## 2022-03-16 DIAGNOSIS — I5042 Chronic combined systolic (congestive) and diastolic (congestive) heart failure: Secondary | ICD-10-CM | POA: Diagnosis not present

## 2022-03-16 DIAGNOSIS — I4819 Other persistent atrial fibrillation: Secondary | ICD-10-CM | POA: Diagnosis not present

## 2022-03-17 ENCOUNTER — Inpatient Hospital Stay (HOSPITAL_COMMUNITY): Payer: Medicare HMO

## 2022-03-17 ENCOUNTER — Encounter (HOSPITAL_COMMUNITY): Payer: Self-pay | Admitting: Emergency Medicine

## 2022-03-17 ENCOUNTER — Other Ambulatory Visit: Payer: Self-pay

## 2022-03-17 ENCOUNTER — Emergency Department (HOSPITAL_COMMUNITY): Payer: Medicare HMO

## 2022-03-17 ENCOUNTER — Inpatient Hospital Stay (HOSPITAL_COMMUNITY)
Admission: EM | Admit: 2022-03-17 | Discharge: 2022-03-25 | DRG: 286 | Disposition: A | Discharge status: Home / Self Care | Payer: Medicare HMO | Attending: Family Medicine | Admitting: Family Medicine

## 2022-03-17 ENCOUNTER — Emergency Department (HOSPITAL_BASED_OUTPATIENT_CLINIC_OR_DEPARTMENT_OTHER): Payer: Medicare HMO

## 2022-03-17 DIAGNOSIS — I509 Heart failure, unspecified: Secondary | ICD-10-CM

## 2022-03-17 DIAGNOSIS — M7989 Other specified soft tissue disorders: Secondary | ICD-10-CM | POA: Diagnosis present

## 2022-03-17 DIAGNOSIS — N1832 Chronic kidney disease, stage 3b: Secondary | ICD-10-CM | POA: Diagnosis not present

## 2022-03-17 DIAGNOSIS — R159 Full incontinence of feces: Secondary | ICD-10-CM | POA: Diagnosis not present

## 2022-03-17 DIAGNOSIS — Z87891 Personal history of nicotine dependence: Secondary | ICD-10-CM

## 2022-03-17 DIAGNOSIS — N179 Acute kidney failure, unspecified: Secondary | ICD-10-CM | POA: Diagnosis not present

## 2022-03-17 DIAGNOSIS — K08109 Complete loss of teeth, unspecified cause, unspecified class: Secondary | ICD-10-CM | POA: Diagnosis present

## 2022-03-17 DIAGNOSIS — I2510 Atherosclerotic heart disease of native coronary artery without angina pectoris: Secondary | ICD-10-CM | POA: Diagnosis not present

## 2022-03-17 DIAGNOSIS — I081 Rheumatic disorders of both mitral and tricuspid valves: Secondary | ICD-10-CM | POA: Diagnosis present

## 2022-03-17 DIAGNOSIS — E785 Hyperlipidemia, unspecified: Secondary | ICD-10-CM | POA: Diagnosis present

## 2022-03-17 DIAGNOSIS — I255 Ischemic cardiomyopathy: Secondary | ICD-10-CM | POA: Diagnosis not present

## 2022-03-17 DIAGNOSIS — I4819 Other persistent atrial fibrillation: Secondary | ICD-10-CM | POA: Diagnosis not present

## 2022-03-17 DIAGNOSIS — I2582 Chronic total occlusion of coronary artery: Secondary | ICD-10-CM | POA: Diagnosis present

## 2022-03-17 DIAGNOSIS — I872 Venous insufficiency (chronic) (peripheral): Secondary | ICD-10-CM | POA: Diagnosis present

## 2022-03-17 DIAGNOSIS — J9811 Atelectasis: Secondary | ICD-10-CM | POA: Diagnosis not present

## 2022-03-17 DIAGNOSIS — I6521 Occlusion and stenosis of right carotid artery: Secondary | ICD-10-CM | POA: Diagnosis present

## 2022-03-17 DIAGNOSIS — I3139 Other pericardial effusion (noninflammatory): Secondary | ICD-10-CM | POA: Diagnosis not present

## 2022-03-17 DIAGNOSIS — Z66 Do not resuscitate: Secondary | ICD-10-CM | POA: Diagnosis not present

## 2022-03-17 DIAGNOSIS — I4811 Longstanding persistent atrial fibrillation: Secondary | ICD-10-CM | POA: Diagnosis not present

## 2022-03-17 DIAGNOSIS — J90 Pleural effusion, not elsewhere classified: Secondary | ICD-10-CM | POA: Diagnosis not present

## 2022-03-17 DIAGNOSIS — I340 Nonrheumatic mitral (valve) insufficiency: Secondary | ICD-10-CM | POA: Diagnosis not present

## 2022-03-17 DIAGNOSIS — I4891 Unspecified atrial fibrillation: Secondary | ICD-10-CM | POA: Diagnosis not present

## 2022-03-17 DIAGNOSIS — I5041 Acute combined systolic (congestive) and diastolic (congestive) heart failure: Secondary | ICD-10-CM | POA: Diagnosis present

## 2022-03-17 DIAGNOSIS — Z79899 Other long term (current) drug therapy: Secondary | ICD-10-CM

## 2022-03-17 DIAGNOSIS — Z7902 Long term (current) use of antithrombotics/antiplatelets: Secondary | ICD-10-CM

## 2022-03-17 DIAGNOSIS — I5021 Acute systolic (congestive) heart failure: Secondary | ICD-10-CM | POA: Diagnosis not present

## 2022-03-17 DIAGNOSIS — I517 Cardiomegaly: Secondary | ICD-10-CM | POA: Diagnosis not present

## 2022-03-17 DIAGNOSIS — R350 Frequency of micturition: Secondary | ICD-10-CM | POA: Diagnosis present

## 2022-03-17 DIAGNOSIS — L03116 Cellulitis of left lower limb: Secondary | ICD-10-CM | POA: Diagnosis present

## 2022-03-17 DIAGNOSIS — I110 Hypertensive heart disease with heart failure: Secondary | ICD-10-CM | POA: Diagnosis not present

## 2022-03-17 DIAGNOSIS — I6529 Occlusion and stenosis of unspecified carotid artery: Secondary | ICD-10-CM | POA: Diagnosis not present

## 2022-03-17 DIAGNOSIS — R0602 Shortness of breath: Secondary | ICD-10-CM | POA: Diagnosis not present

## 2022-03-17 DIAGNOSIS — N289 Disorder of kidney and ureter, unspecified: Secondary | ICD-10-CM | POA: Diagnosis not present

## 2022-03-17 DIAGNOSIS — I130 Hypertensive heart and chronic kidney disease with heart failure and stage 1 through stage 4 chronic kidney disease, or unspecified chronic kidney disease: Principal | ICD-10-CM | POA: Diagnosis present

## 2022-03-17 DIAGNOSIS — Z20822 Contact with and (suspected) exposure to covid-19: Secondary | ICD-10-CM | POA: Diagnosis present

## 2022-03-17 DIAGNOSIS — Z951 Presence of aortocoronary bypass graft: Secondary | ICD-10-CM

## 2022-03-17 DIAGNOSIS — I4821 Permanent atrial fibrillation: Secondary | ICD-10-CM | POA: Diagnosis present

## 2022-03-17 DIAGNOSIS — D7589 Other specified diseases of blood and blood-forming organs: Secondary | ICD-10-CM | POA: Diagnosis present

## 2022-03-17 DIAGNOSIS — Z91148 Patient's other noncompliance with medication regimen for other reason: Secondary | ICD-10-CM

## 2022-03-17 DIAGNOSIS — Z8249 Family history of ischemic heart disease and other diseases of the circulatory system: Secondary | ICD-10-CM

## 2022-03-17 DIAGNOSIS — I5082 Biventricular heart failure: Secondary | ICD-10-CM | POA: Diagnosis not present

## 2022-03-17 DIAGNOSIS — R609 Edema, unspecified: Secondary | ICD-10-CM

## 2022-03-17 DIAGNOSIS — I739 Peripheral vascular disease, unspecified: Secondary | ICD-10-CM | POA: Diagnosis not present

## 2022-03-17 DIAGNOSIS — R846 Abnormal cytological findings in specimens from respiratory organs and thorax: Secondary | ICD-10-CM | POA: Diagnosis not present

## 2022-03-17 DIAGNOSIS — Z7982 Long term (current) use of aspirin: Secondary | ICD-10-CM

## 2022-03-17 DIAGNOSIS — R600 Localized edema: Secondary | ICD-10-CM | POA: Diagnosis not present

## 2022-03-17 DIAGNOSIS — I361 Nonrheumatic tricuspid (valve) insufficiency: Secondary | ICD-10-CM | POA: Diagnosis not present

## 2022-03-17 DIAGNOSIS — Z7189 Other specified counseling: Secondary | ICD-10-CM | POA: Diagnosis not present

## 2022-03-17 DIAGNOSIS — I5043 Acute on chronic combined systolic (congestive) and diastolic (congestive) heart failure: Secondary | ICD-10-CM | POA: Diagnosis present

## 2022-03-17 DIAGNOSIS — I878 Other specified disorders of veins: Secondary | ICD-10-CM | POA: Diagnosis present

## 2022-03-17 DIAGNOSIS — Z515 Encounter for palliative care: Secondary | ICD-10-CM | POA: Diagnosis not present

## 2022-03-17 DIAGNOSIS — E1122 Type 2 diabetes mellitus with diabetic chronic kidney disease: Secondary | ICD-10-CM | POA: Diagnosis not present

## 2022-03-17 DIAGNOSIS — I5022 Chronic systolic (congestive) heart failure: Secondary | ICD-10-CM | POA: Diagnosis not present

## 2022-03-17 DIAGNOSIS — E1151 Type 2 diabetes mellitus with diabetic peripheral angiopathy without gangrene: Secondary | ICD-10-CM | POA: Diagnosis present

## 2022-03-17 DIAGNOSIS — I2729 Other secondary pulmonary hypertension: Secondary | ICD-10-CM | POA: Diagnosis not present

## 2022-03-17 DIAGNOSIS — I088 Other rheumatic multiple valve diseases: Secondary | ICD-10-CM | POA: Diagnosis not present

## 2022-03-17 DIAGNOSIS — I272 Pulmonary hypertension, unspecified: Secondary | ICD-10-CM | POA: Diagnosis not present

## 2022-03-17 DIAGNOSIS — L089 Local infection of the skin and subcutaneous tissue, unspecified: Secondary | ICD-10-CM | POA: Diagnosis not present

## 2022-03-17 HISTORY — PX: IR THORACENTESIS ASP PLEURAL SPACE W/IMG GUIDE: IMG5380

## 2022-03-17 LAB — ECHOCARDIOGRAM COMPLETE
Area-P 1/2: 6.32 cm2
Calc EF: 26.2 %
Height: 62 in
MV M vel: 6.63 m/s
MV Peak grad: 176 mmHg
MV VTI: 0.21 cm2
P 1/2 time: 369 msec
Radius: 1 cm
S' Lateral: 4.5 cm
Single Plane A2C EF: 28.4 %
Single Plane A4C EF: 20.4 %
Weight: 2336 oz

## 2022-03-17 LAB — CBC
HCT: 42.1 % (ref 39.0–52.0)
Hemoglobin: 13.6 g/dL (ref 13.0–17.0)
MCH: 35 pg — ABNORMAL HIGH (ref 26.0–34.0)
MCHC: 32.3 g/dL (ref 30.0–36.0)
MCV: 108.2 fL — ABNORMAL HIGH (ref 80.0–100.0)
Platelets: 160 10*3/uL (ref 150–400)
RBC: 3.89 MIL/uL — ABNORMAL LOW (ref 4.22–5.81)
RDW: 15.9 % — ABNORMAL HIGH (ref 11.5–15.5)
WBC: 6.9 10*3/uL (ref 4.0–10.5)
nRBC: 0 % (ref 0.0–0.2)

## 2022-03-17 LAB — D-DIMER, QUANTITATIVE: D-Dimer, Quant: 0.75 ug/mL-FEU — ABNORMAL HIGH (ref 0.00–0.50)

## 2022-03-17 LAB — HEPATIC FUNCTION PANEL
ALT: 24 U/L (ref 0–44)
AST: 31 U/L (ref 15–41)
Albumin: 3 g/dL — ABNORMAL LOW (ref 3.5–5.0)
Alkaline Phosphatase: 93 U/L (ref 38–126)
Bilirubin, Direct: 0.5 mg/dL — ABNORMAL HIGH (ref 0.0–0.2)
Indirect Bilirubin: 0.7 mg/dL (ref 0.3–0.9)
Total Bilirubin: 1.2 mg/dL (ref 0.3–1.2)
Total Protein: 6.5 g/dL (ref 6.5–8.1)

## 2022-03-17 LAB — BODY FLUID CELL COUNT WITH DIFFERENTIAL
Eos, Fluid: 0 %
Lymphs, Fluid: 23 %
Monocyte-Macrophage-Serous Fluid: 60 % (ref 50–90)
Neutrophil Count, Fluid: 17 % (ref 0–25)
Total Nucleated Cell Count, Fluid: 107 cu mm (ref 0–1000)

## 2022-03-17 LAB — TROPONIN I (HIGH SENSITIVITY)
Troponin I (High Sensitivity): 39 ng/L — ABNORMAL HIGH (ref <18)
Troponin I (High Sensitivity): 35 ng/L — ABNORMAL HIGH (ref <18)

## 2022-03-17 LAB — BASIC METABOLIC PANEL
Anion gap: 7 (ref 5–15)
BUN: 27 mg/dL — ABNORMAL HIGH (ref 8–23)
CO2: 23 mmol/L (ref 22–32)
Calcium: 10.1 mg/dL (ref 8.9–10.3)
Chloride: 113 mmol/L — ABNORMAL HIGH (ref 98–111)
Creatinine, Ser: 1.4 mg/dL — ABNORMAL HIGH (ref 0.61–1.24)
GFR, Estimated: 50 mL/min — ABNORMAL LOW (ref >60)
Glucose, Bld: 99 mg/dL (ref 70–99)
Potassium: 4.1 mmol/L (ref 3.5–5.1)
Sodium: 143 mmol/L (ref 135–145)

## 2022-03-17 LAB — GLUCOSE, PLEURAL OR PERITONEAL FLUID: Glucose, Fluid: 109 mg/dL

## 2022-03-17 LAB — BRAIN NATRIURETIC PEPTIDE: B Natriuretic Peptide: 714.2 pg/mL — ABNORMAL HIGH (ref 0.0–100.0)

## 2022-03-17 LAB — RESP PANEL BY RT-PCR (FLU A&B, COVID) ARPGX2
Influenza A by PCR: NEGATIVE
Influenza B by PCR: NEGATIVE
SARS Coronavirus 2 by RT PCR: NEGATIVE

## 2022-03-17 LAB — SEDIMENTATION RATE: Sed Rate: 2 mm/hr (ref 0–16)

## 2022-03-17 LAB — LACTATE DEHYDROGENASE, PLEURAL OR PERITONEAL FLUID: LD, Fluid: 32 U/L — ABNORMAL HIGH (ref 3–23)

## 2022-03-17 LAB — GRAM STAIN

## 2022-03-17 LAB — C-REACTIVE PROTEIN: CRP: <0.5 mg/dL (ref <1.0)

## 2022-03-17 LAB — MRSA NEXT GEN BY PCR, NASAL: MRSA by PCR Next Gen: NOT DETECTED

## 2022-03-17 LAB — PROTEIN, PLEURAL OR PERITONEAL FLUID: Total protein, fluid: <3 g/dL

## 2022-03-17 MED ORDER — LIDOCAINE HCL 1 % IJ SOLN
INTRAMUSCULAR | Status: DC | PRN
  Administered 2022-03-17: 5 mL via INTRADERMAL

## 2022-03-17 MED ORDER — ACETAMINOPHEN 650 MG RE SUPP: 650.0000 mg | Freq: Four times a day (QID) | RECTAL | Status: DC | PRN

## 2022-03-17 MED ORDER — FUROSEMIDE 10 MG/ML IJ SOLN
40.0000 mg | Freq: Two times a day (BID) | INTRAMUSCULAR | Status: DC
  Administered 2022-03-17 – 2022-03-18 (×3): 40 mg via INTRAVENOUS
  Filled 2022-03-17 (×3): qty 4

## 2022-03-17 MED ORDER — FUROSEMIDE 10 MG/ML IJ SOLN
40.0000 mg | Freq: Once | INTRAMUSCULAR | Status: AC
  Administered 2022-03-17: 40 mg via INTRAVENOUS
  Filled 2022-03-17: qty 4

## 2022-03-17 MED ORDER — ENOXAPARIN SODIUM 40 MG/0.4ML IJ SOSY
40.0000 mg | PREFILLED_SYRINGE | INTRAMUSCULAR | Status: DC
  Administered 2022-03-17 – 2022-03-18 (×2): 40 mg via SUBCUTANEOUS
  Filled 2022-03-17 (×2): qty 0.4

## 2022-03-17 MED ORDER — ACETAMINOPHEN 325 MG PO TABS
650.0000 mg | ORAL_TABLET | Freq: Four times a day (QID) | ORAL | Status: DC | PRN
  Administered 2022-03-24: 650 mg via ORAL
  Filled 2022-03-17: qty 2

## 2022-03-17 MED ORDER — CARVEDILOL 25 MG PO TABS
25.0000 mg | ORAL_TABLET | Freq: Two times a day (BID) | ORAL | Status: DC
  Administered 2022-03-18 (×2): 25 mg via ORAL
  Filled 2022-03-17 (×3): qty 1

## 2022-03-17 MED ORDER — VANCOMYCIN HCL IN DEXTROSE 1-5 GM/200ML-% IV SOLN
1000.0000 mg | Freq: Once | INTRAVENOUS | Status: AC
  Administered 2022-03-17: 1000 mg via INTRAVENOUS
  Filled 2022-03-17: qty 200

## 2022-03-17 MED ORDER — ATORVASTATIN CALCIUM 80 MG PO TABS
80.0000 mg | ORAL_TABLET | Freq: Every day | ORAL | Status: DC
  Administered 2022-03-18 – 2022-03-24 (×7): 80 mg via ORAL
  Filled 2022-03-17 (×7): qty 1

## 2022-03-17 MED ORDER — SPIRONOLACTONE 25 MG PO TABS
25.0000 mg | ORAL_TABLET | Freq: Every day | ORAL | Status: DC
  Administered 2022-03-17 – 2022-03-18 (×2): 25 mg via ORAL
  Filled 2022-03-17 (×3): qty 1

## 2022-03-17 MED ORDER — LIDOCAINE HCL 1 % IJ SOLN
INTRAMUSCULAR | Status: AC
  Filled 2022-03-17: qty 20

## 2022-03-17 MED ORDER — ASPIRIN 81 MG PO CHEW: 81.0000 mg | CHEWABLE_TABLET | ORAL | Status: DC

## 2022-03-17 MED ORDER — IRBESARTAN 150 MG PO TABS
150.0000 mg | ORAL_TABLET | Freq: Every day | ORAL | Status: DC
  Administered 2022-03-18: 150 mg via ORAL
  Filled 2022-03-17 (×2): qty 1

## 2022-03-17 MED ORDER — CLOPIDOGREL BISULFATE 75 MG PO TABS
75.0000 mg | ORAL_TABLET | Freq: Every day | ORAL | Status: DC
  Administered 2022-03-18 – 2022-03-23 (×6): 75 mg via ORAL
  Filled 2022-03-17 (×6): qty 1

## 2022-03-17 NOTE — Procedures (Signed)
PROCEDURE SUMMARY: ? ?Successful US guided right thoracentesis. ?Yielded 1.3 L of clear yellow fluid. ?Pt tolerated procedure well. ?No immediate complications. ? ?Specimen sent for labs. ?CXR ordered; no post-procedure pneumothorax identified.  ? ?EBL < 2 mL ? ?Mickie Kay, NP ?03/17/2022 ?3:37 PM ? ? ? ?

## 2022-03-17 NOTE — Consult Note (Signed)
? ?ASSESSMENT & PLAN  ? ?PERIPHERAL ARTERIAL DISEASE: This patient has evidence of multilevel arterial occlusive disease.  Currently however his biggest issue is his congestive heart failure and significant leg swelling.  He has stable claudication of both lower extremities without rest pain.  He does has a small wound on the right leg but I think this should improve as the swelling improves.  Once he stabilizes medically we could certainly consider arteriography.  This could be either done this admission or arranged after follow-up as an outpatient. ? ?I would recommend elevation of his legs as tolerated but given his PAD I would just elevate the legs on 1-2 pillows. ? ?Fortunately, he is not a smoker.  He is on aspirin, a statin, and Plavix. ? ?Recommend the following which can slow the progression of atherosclerosis and reduce the risk of major adverse cardiac / limb events:  ?Aspirin 81mg  PO QD.  ?Atorvastatin 40-80mg  PO QD (or other "high intensity" statin therapy). ?Complete cessation from all tobacco products. ?Blood glucose control with goal A1c < 7%. ?Blood pressure control with goal blood pressure < 140/90 mmHg. ?Lipid reduction therapy with goal LDL-C <100 mg/dL (<70 if symptomatic from PAD).  ? ? ?REASON FOR CONSULT:   ? ?Peripheral arterial disease.  The consult is requested by the [facility] Medicine Teaching Service. ? ?HPI:  ? ?[name] [name] is a 81 y.o. male who was admitted with bilateral lower extremity swelling and pain.  This is felt to be secondary to an exacerbation of his congestive heart failure.  He had arterial Doppler studies done which showed evidence of significant peripheral arterial disease and for this reason vascular surgery is consulted. ? ?On my history, the patient admits to the gradual onset of thigh and calf claudication about 2 months ago.  This occurs at a short distance.  Right and left legs are about the same.  He denies any history of rest pain.  He denies any previous history  of nonhealing ulcers. ? ?He has undergone previous coronary revascularization and vein was taken from the right leg. ? ?His risk factors for peripheral arterial disease include hypertension and hypercholesterolemia.  He denies any history of diabetes, family history of premature cardiovascular disease, or tobacco use. ? ?Past Medical History:  ?Diagnosis Date  ? Atrial fibrillation ([place]) 03/21/2016  ?The patient's past medical history is significant for coronary artery disease (status post CABG), A-fib, and congestive heart failure.  The patient also has large pleural effusions. ? ?History reviewed. No pertinent family history. ? ?SOCIAL HISTORY: ?Social History  ? ?Tobacco Use  ? Smoking status: Former  ? Smokeless tobacco: Former  ?Substance Use Topics  ? Alcohol use: Not Currently  ? ? ?No Known Allergies ? ?Current Facility-Administered Medications  ?Medication Dose Route Frequency Provider Last Rate Last Admin  ? acetaminophen (TYLENOL) tablet 650 mg  650 mg Oral Q6H PRN Erskine Emery, MD      ? Or  ? acetaminophen (TYLENOL) suppository 650 mg  650 mg Rectal Q6H PRN Erskine Emery, MD      ? Derrill Memo ON 03/18/2022] aspirin chewable tablet 81 mg  81 mg Oral Q M,W,F Maxwell, Allee, MD      ? Derrill Memo ON 03/18/2022] atorvastatin (LIPITOR) tablet 80 mg  80 mg Oral q1800 Erskine Emery, MD      ? Derrill Memo ON 03/18/2022] carvedilol (COREG) tablet 25 mg  25 mg Oral BID WC Erskine Emery, MD      ? Derrill Memo ON 03/18/2022] clopidogrel (PLAVIX) tablet  75 mg  75 mg Oral Daily Maxwell, Allee, MD      ? enoxaparin (LOVENOX) injection 40 mg  40 mg Subcutaneous Q24H Erskine Emery, MD   40 mg at 03/17/22 1352  ? furosemide (LASIX) injection 40 mg  40 mg Intravenous BID Erskine Emery, MD      ? Derrill Memo ON 03/18/2022] irbesartan (AVAPRO) tablet 150 mg  150 mg Oral Daily Maxwell, Allee, MD      ? lidocaine (XYLOCAINE) 1 % (with pres) injection           ? lidocaine (XYLOCAINE) 1 % (with pres) injection    PRN Soyla Dryer R, NP   5 mL at  03/17/22 1457  ? ?Current Outpatient Medications  ?Medication Sig Dispense Refill  ? Ascorbic Acid (VITAMIN C) 1000 MG tablet Take 1,000 mg by mouth daily.    ? aspirin 81 MG tablet Take 81 mg by mouth every Monday, Wednesday, and Friday.    ? atorvastatin (LIPITOR) 80 MG tablet TAKE 1 TABLET (80 MG TOTAL) BY MOUTH DAILY AT 6 PM. 90 tablet 3  ? carvedilol (COREG) 25 MG tablet Take 1 tablet (25 mg total) by mouth in the morning and at bedtime. 180 tablet 3  ? clopidogrel (PLAVIX) 75 MG tablet TAKE 1 TABLET BY MOUTH EVERY DAY 90 tablet 1  ? furosemide (LASIX) 20 MG tablet Take 1 tablet (20 mg total) by mouth daily. 90 tablet 3  ? magnesium 30 MG tablet Take 30 mg by mouth daily.    ? pantoprazole (PROTONIX) 40 MG tablet Take 1 tablet (40 mg total) by mouth daily. PLEASE MAKE APPOINTMENT FOR FURTHER REFILLS 90 tablet 0  ? spironolactone (ALDACTONE) 25 MG tablet TAKE 0.5 TABLETS (12.5 MG TOTAL) BY MOUTH 2 (TWO) TIMES DAILY. TAKE 0.5 TABLETS BY MOUTH DAILY. 90 tablet 3  ? valsartan (DIOVAN) 160 MG tablet TAKE 1/2 TABLET DAILY FOR ONE WEEK, THEN INCREASE TO 1 WHOLE TABLET DAILY. 90 tablet 3  ? ? ?REVIEW OF SYSTEMS:  ?[X]  denotes positive finding, [ ]  denotes negative finding ?Cardiac  Comments:  ?Chest pain or chest pressure:    ?Shortness of breath upon exertion: x   ?Short of breath when lying flat:    ?Irregular heart rhythm:    ?    ?Vascular    ?Pain in calf, thigh, or hip brought on by ambulation:    ?Pain in feet at night that wakes you up from your sleep:     ?Blood clot in your veins:    ?Leg swelling:  x   ?    ?Pulmonary    ?Oxygen at home:    ?Productive cough:     ?Wheezing:     ?    ?Neurologic    ?Sudden weakness in arms or legs:     ?Sudden numbness in arms or legs:     ?Sudden onset of difficulty speaking or slurred speech:    ?Temporary loss of vision in one eye:     ?Problems with dizziness:     ?    ?Gastrointestinal    ?Blood in stool:     ?Vomited blood:     ?    ?Genitourinary    ?Burning when  urinating:     ?Blood in urine:    ?    ?Psychiatric    ?Major depression:     ?    ?Hematologic    ?Bleeding problems:    ?Problems with blood clotting too easily:    ?    ?  Skin    ?Rashes or ulcers: x Cellulitis left foot  ?    ?Constitutional    ?Fever or chills:    ?- ? ?PHYSICAL EXAM:  ? ?Vitals:  ? 03/17/22 0945 03/17/22 1045 03/17/22 1215 03/17/22 1502  ?BP: (!) 109/92 117/74 (!) 135/98 124/81  ?Pulse: 86 85 86   ?Resp: (!) 22 (!) 24 16   ?Temp:      ?TempSrc:      ?SpO2: 96% 100% 99%   ?Weight:      ?Height:      ? ?Body mass index is 26.7 kg/m?. ?GENERAL: The patient is a well-nourished male, in no acute distress. The vital signs are documented above. ?CARDIAC: There is a regular rate and rhythm.  ?VASCULAR: I do not detect carotid bruits. ?He has a palpable right femoral pulse. ?I cannot palpate a left femoral pulse however he has a hernia in the left groin and given the swelling it is difficult to assess. ?He has monophasic dorsalis pedis signals bilaterally. ?He has significant bilateral lower extremity swelling. ?PULMONARY: There is good air exchange bilaterally without wheezing or rales. ?ABDOMEN: Soft and non-tender with normal pitched bowel sounds.  ?MUSCULOSKELETAL: There are no major deformities. ?NEUROLOGIC: No focal weakness or paresthesias are detected. ?SKIN: He has significant bilateral lower extremity swelling and cellulitis as shown in the pictures below with a small wound on the right leg. ? ? ? ? ?PSYCHIATRIC: The patient has a normal affect. ? ?DATA:   ? ?ARTERIAL DOPPLER STUDY: I have independently interpreted his arterial Doppler study today. ? ?On the right side there is a monophasic dorsalis pedis signal.  Posterior tibial signal could not be obtained.  ABI is 63%.  Toe pressures 58 mmHg. ? ?On the left side, there is a monophasic dorsalis pedis signal.  A posterior tibial signal cannot be obtained.  The toe pressure is 0.  ABI on the left is 48%. ? ?BILATERAL LOWER EXTREMITY VENOUS  DUPLEX STUDY: I have independently interpreted his bilateral lower extremity venous duplex study.  There is no evidence of DVT in either lower extremity.  There are pulsatile venous signals consistent with eleva

## 2022-03-17 NOTE — Progress Notes (Signed)
Bilateral lower extremity venous duplex and ABI completed. ?Refer to "CV Proc" under chart review to view preliminary results. ? ?03/17/2022 11:24 AM ?Kelby Aline., MHA, RVT, RDCS, RDMS   ?

## 2022-03-17 NOTE — ED Triage Notes (Signed)
Pt. Stated, I started having swelling for 2 months in my hands and legs.  ?

## 2022-03-17 NOTE — ED Provider Notes (Signed)
?[facility] ?Provider Note ? ? ?CSN: [id number] ?Arrival date & time: 03/17/22  0748 ? ?  ? ?History ? ?Chief Complaint  ?Patient presents with  ? Leg Swelling  ? swelling hands  ? ? ?[name] [name] is a 81 y.o. male. ? ?HPI ? ?81 year old male with medical history significant for bilateral pleural effusions, CHF (last EF in June 2021 45 to 50% with inferior and inferolateral hypokinesis, moderate to severe mitral regurgitation and mild to moderate tricuspid regurgitation), HTN, CAD on aspirin and Plavix status post CABG, PAD, atrial fibrillation not on home anticoagulation who presents to the emergency department with roughly 2 months of worsening bilateral lower extremity edema.  The patient states that for the last 2 months he has had worsening lower extremity edema.  He does note some asymmetry to his swelling with his left foot swelling significantly more than his right foot.  He also endorses bilateral hand swelling.  He endorses pain and redness in his left foot. Denies fevers or chills. Endorses mild shortness of breath.  ? ?It appears he was last seen by cardiology in December 2021.  His last cardiac catheterization in 2017 revealed severe multivessel CAD for which she will underwent CABG x4 (LIMA to LAD, SVG to OM, SVG to PDA, and SVG to diagonal).  He also underwent clipping of the left atrial appendage and left carotid endarterectomy.  He developed postoperative atrial fibrillation and pleural effusions which required thoracentesis.  His atrial fibrillation had improved following cardioversion.  He appears to have been lost to follow-up since then.  He was last seen by vascular surgery in 2017 for his PAD. ? ?Home Medications ?Prior to Admission medications   ?Medication Sig Start Date End Date Taking? Authorizing Provider  ?Ascorbic Acid (VITAMIN C) 1000 MG tablet Take 1,000 mg by mouth daily.    [provider]  ?aspirin 81 MG tablet Take 81 mg by mouth every  Monday, Wednesday, and Friday.    [provider]  ?atorvastatin (LIPITOR) 80 MG tablet TAKE 1 TABLET (80 MG TOTAL) BY MOUTH DAILY AT 6 PM. 07/01/21   Lennette BihariKelly, Thomas A, MD  ?carvedilol (COREG) 25 MG tablet Take 1 tablet (25 mg total) by mouth in the morning and at bedtime. 11/25/20   Lennette BihariKelly, Thomas A, MD  ?clopidogrel (PLAVIX) 75 MG tablet TAKE 1 TABLET BY MOUTH EVERY DAY 11/06/20   Lennette BihariKelly, Thomas A, MD  ?furosemide (LASIX) 20 MG tablet Take 1 tablet (20 mg total) by mouth daily. 07/23/20   Lennette BihariKelly, Thomas A, MD  ?magnesium 30 MG tablet Take 30 mg by mouth daily.    [provider]  ?pantoprazole (PROTONIX) 40 MG tablet Take 1 tablet (40 mg total) by mouth daily. PLEASE MAKE APPOINTMENT FOR FURTHER REFILLS 01/28/17   Lennette BihariKelly, Thomas A, MD  ?spironolactone (ALDACTONE) 25 MG tablet TAKE 0.5 TABLETS (12.5 MG TOTAL) BY MOUTH 2 (TWO) TIMES DAILY. TAKE 0.5 TABLETS BY MOUTH DAILY. 11/26/20   Lennette BihariKelly, Thomas A, MD  ?valsartan (DIOVAN) 160 MG tablet TAKE 1/2 TABLET DAILY FOR ONE WEEK, THEN INCREASE TO 1 WHOLE TABLET DAILY. 05/17/20   Lennette BihariKelly, Thomas A, MD  ?   ? ?Allergies    ?Patient has no known allergies.   ? ?Review of Systems   ?Review of Systems  ?Respiratory:  Positive for shortness of breath.   ?Cardiovascular:  Positive for leg swelling. Negative for chest pain.  ?Musculoskeletal:  Positive for arthralgias and myalgias.  ?All other systems reviewed and  are negative. ? ?Physical Exam ?Updated Vital Signs ?BP (!) 162/70 (BP Location: Left Arm)   Pulse 69   Temp (!) 97.5 ?F (36.4 ?C) (Oral)   Resp 18   Ht 5\' 2"  (1.575 m)   Wt 63.2 kg Comment: scale B  SpO2 95%   BMI 25.48 kg/m?  ?Physical Exam ?Vitals and nursing note reviewed.  ?Constitutional:   ?   General: He is not in acute distress. ?   Appearance: He is well-developed.  ?HENT:  ?   Head: Normocephalic and atraumatic.  ?Eyes:  ?   Conjunctiva/sclera: Conjunctivae normal.  ?Cardiovascular:  ?   Rate and Rhythm: Normal rate and regular rhythm.  ?Pulmonary:   ?   Effort: Pulmonary effort is normal. No respiratory distress.  ?   Breath sounds: Rales present.  ?Abdominal:  ?   Palpations: Abdomen is soft.  ?   Tenderness: There is no abdominal tenderness.  ?Musculoskeletal:     ?   General: Tenderness present.  ?   Cervical back: Neck supple.  ?   Right lower leg: Edema present.  ?   Left lower leg: Edema present.  ?   Comments: Asymmetric but bilateral lower extremity edema, left greater than right with significant swelling of the left foot (4+).  Intact pulses bilaterally  ?Skin: ?   General: Skin is warm and dry.  ?   Capillary Refill: Capillary refill takes less than 2 seconds.  ?Neurological:  ?   Mental Status: He is alert.  ?Psychiatric:     ?   Mood and Affect: Mood normal.  ? ? ? ? ?ED Results / Procedures / Treatments   ?Labs ?(all labs ordered are listed, but only abnormal results are displayed) ?Labs Reviewed  ?BASIC METABOLIC PANEL - Abnormal; Notable for the following components:  ?    Result Value  ? Chloride 113 (*)   ? BUN 27 (*)   ? Creatinine, Ser 1.40 (*)   ? GFR, Estimated 50 (*)   ? All other components within normal limits  ?CBC - Abnormal; Notable for the following components:  ? RBC 3.89 (*)   ? MCV 108.2 (*)   ? MCH 35.0 (*)   ? RDW 15.9 (*)   ? All other components within normal limits  ?BRAIN NATRIURETIC PEPTIDE - Abnormal; Notable for the following components:  ? B Natriuretic Peptide 714.2 (*)   ? All other components within normal limits  ?HEPATIC FUNCTION PANEL - Abnormal; Notable for the following components:  ? Albumin 3.0 (*)   ? Bilirubin, Direct 0.5 (*)   ? All other components within normal limits  ?D-DIMER, QUANTITATIVE - Abnormal; Notable for the following components:  ? D-Dimer, Quant 0.75 (*)   ? All other components within normal limits  ?LACTATE DEHYDROGENASE, PLEURAL OR PERITONEAL FLUID - Abnormal; Notable for the following components:  ? LD, Fluid 32 (*)   ? All other components within normal limits  ?BODY FLUID CELL COUNT  WITH DIFFERENTIAL - Abnormal; Notable for the following components:  ? Color, Fluid STRAW (*)   ? Appearance, Fluid CLEAR (*)   ? All other components within normal limits  ?TROPONIN I (HIGH SENSITIVITY) - Abnormal; Notable for the following components:  ? Troponin I (High Sensitivity) 39 (*)   ? All other components within normal limits  ?TROPONIN I (HIGH SENSITIVITY) - Abnormal; Notable for the following components:  ? Troponin I (High Sensitivity) 35 (*)   ? All other components within  normal limits  ?MRSA NEXT GEN BY PCR, NASAL  ?RESP PANEL BY RT-PCR (FLU A&B, COVID) [id number]  ?GRAM STAIN  ?CULTURE, BODY FLUID W GRAM STAIN -BOTTLE  ?C-REACTIVE PROTEIN  ?SEDIMENTATION RATE  ?PROTEIN, PLEURAL OR PERITONEAL FLUID  ?GLUCOSE, PLEURAL OR PERITONEAL FLUID  ?BASIC METABOLIC PANEL  ?CBC  ?MAGNESIUM  ?CYTOLOGY - NON PAP  ? ? ?EKG ?EKG Interpretation ? ?Date/Time:  Tuesday March 17 2022 07:57:15 EDT ?Ventricular Rate:  98 ?PR Interval:    ?QRS Duration: 110 ?QT Interval:  364 ?QTC Calculation: 464 ?R Axis:   138 ?Text Interpretation: Atrial fibrillation with premature ventricular or aberrantly conducted complexes Incomplete right bundle branch block Left posterior fascicular block Cannot rule out Anterior infarct , age undetermined Abnormal ECG When compared with ECG of 24-Mar-2016 05:44, PREVIOUS ECG IS PRESENT Confirmed by Ernie Avena ([id number]) on 03/17/2022 10:06:11 AM ? ?Radiology ?DG Chest 1 View ? ?Result Date: 03/17/2022 ?CLINICAL DATA:  Status post right thoracentesis EXAM: CHEST  1 VIEW COMPARISON:  Prior chest x-ray 03/17/2022 FINDINGS: Significant interval reduction and right-sided pleural effusion. No evidence of pneumothorax. Persistent left pleural effusion. Stable cardiomegaly. Tortuous and atherosclerotic thoracic aorta. Surgical changes of prior median sternotomy for multivessel CABG. Left atrial appendage ligation clip in place. No acute osseous abnormality. IMPRESSION: No evidence of pneumothorax following  right-sided pleural effusion. Significant decrease in right pleural fluid volume. Electronically Signed   By: Malachy Moan M.D.   On: 03/17/2022 15:31  ? ?DG Chest 2 View ? ?Result Date: 03/17/2022 ?CLINICAL DA

## 2022-03-17 NOTE — ED Notes (Signed)
Pt transported to echo 

## 2022-03-17 NOTE — H&P (Addendum)
[name] [name]  Date of Exam:   03/17/2022 Medical Rec #: [id number]    Accession #:    [id number] Date of Birth: [birth year]    Patient Gender: M Patient Age:   81 years Exam Location:  [facility] Procedure:      VAS [place] LOWER EXTREMITY VENOUS (DVT) Referring Phys: Ernie Avena --------------------------------------------------------------------------------  Indications: Bilateral pitting edema.  Comparison Study: No prior study Performing Technologist: Gertie Fey MHA, RDMS, RVT, RDCS  Examination Guidelines: A complete evaluation includes B-mode imaging, spectral Doppler, color Doppler, and power Doppler as needed of all accessible portions of each vessel. Bilateral testing is considered an integral part of a complete examination. Limited examinations for reoccurring  indications may be performed as noted. The reflux portion of the exam is performed with the patient in reverse Trendelenburg.  +---------+---------------+---------+-----------+----------+--------------+ RIGHT    CompressibilityPhasicitySpontaneityPropertiesThrombus Aging +---------+---------------+---------+-----------+----------+--------------+ CFV      Full           No       Yes                                 +---------+---------------+---------+-----------+----------+--------------+ SFJ      Full                                                        +---------+---------------+---------+-----------+----------+--------------+ FV Prox  Full                                                        +---------+---------------+---------+-----------+----------+--------------+ FV Mid   Full                                                        +---------+---------------+---------+-----------+----------+--------------+ FV DistalFull                                                        +---------+---------------+---------+-----------+----------+--------------+ PFV      Full                                                        +---------+---------------+---------+-----------+----------+--------------+ POP      Full           No       Yes                                 +---------+---------------+---------+-----------+----------+--------------+ PTV      Full                                                        +---------+---------------+---------+-----------+----------+--------------+  [facility] Admission History and Physical Service Pager: [phone]  Patient name: [name] [name] Medical record number: [id number] Date of birth: 1941/10/27 Age: 81 y.o. Gender: male  Primary Care Provider: Joycelyn Rua, MD Consultants: Cardiology  Code Status: FULL Preferred Emergency Contact:  Contact Information     Name Relation Home Work Mobile   [place] Spouse [phone]        Chief Complaint: LE edema and pain   Assessment and Plan: [name] [name] is a 81 y.o. male presenting with BLE swelling and pain secondary to heart failure exacerbation. PMH is significant for HFmrEF, CAD on ASA and Plavis s/p CABG x4 (LIMA to LAD, SVG to OM, SVG to PDA, and SVG to diagonal), PAD, Afib.    BLE edema and Shortness of breath concerning for HFmrEF Exacerbation Large Pleural Effusions Pt presented to the ED at [facility] earlier today and had labs significant for: Cr elevation to 1.4 (appears baseline is 1.3-1.4), BNP 714, d-dimer 0.75, trop 39>35, no presence of leukocytosis or anemia. VAS/ABI showed presence of pulses with severe arterial disease without DVT. CXR shows significant edema with no cardiomegaly and left foot XR does not show evidence of bone destruction. Afib present on EKG. It appears that the new onset swelling is secondary to fluid overload as the patient is hypervolemic on examination with a history of HFmrEF (echo in June 2021 showed EF of 45 to 50% with inferior and inferolateral hypokinesis, moderate to severe mitral regurgitation, mild/moderate tricuspid regurgitation). Patient was lost to follow up with cardiology but has been taking Lasix 20 mg daily which has not been sufficient in maintaining volume status, appears dry weight at that time was around 129 in 2019. Less likely causes of intermittent SOB would include PE (well's score 0 with no DVT on LE U/S), COPD (no longer a smoker and has no formal diagnosis or history of inhaler use),  infectious (no focal diminishment or PNA evident on CXR). There was also a concern for cellulitis in the LLE (no evidence of osteomyelitis on XR) in the ED. However, not as likely with lack of leukocytosis, infectious/systemic symptoms, or fever, so will continue with monitoring as skin changes likely due to venous stasis. In the ED, patient was given Lasix 40 mg IV and Vancomycin 1g.  -- Admit to FPTS (med-tele), attending Dr. Jennette Kettle -- Strict I/Os  -- Daily weights -- s/p lasix 40 mg IV -- Continue lasix 40 mg BID; can adjust as needed -- Will consult IR re: possible drain to pleural effusion  -- Continue Coreg, ARB -- Tylenol 650 q6prn for pain  -- Consult to cardiology, appreciate recommendations  -- Will hold off on Echo until discussion with IR re: possible drain as this could affect EF  -- Hold further abx  -- AM mag, CBC, BMP -- PT/OT eval  -- Fall precautions  -- Continuous cardiac monitoring   CKD Stage 3b: Chronic, stable Appears close to baseline but difficult to assess (appears between 1.3-1.4). Will monitor kidney function with diuresis.   Severe PAD  H/o CABG x4  Ischemic Cardiomyopathy  Patient has PAD but still has pulses in BLE, currently on Plavix and ASA at home. EKG as above. Had questionable cellulitis in ED and received a dose of Vancomycin in the ED. Given that he has been afebrile, normal WBC, CRP <5, we will hold on further antibiotics at this time. His skin changes could be chronic and related to his PAD. There was no evidence of  [facility] Admission History and Physical Service Pager: [phone]  Patient name: [name] [name] Medical record number: [id number] Date of birth: 1941/10/27 Age: 81 y.o. Gender: male  Primary Care Provider: Joycelyn Rua, MD Consultants: Cardiology  Code Status: FULL Preferred Emergency Contact:  Contact Information     Name Relation Home Work Mobile   [place] Spouse [phone]        Chief Complaint: LE edema and pain   Assessment and Plan: [name] [name] is a 81 y.o. male presenting with BLE swelling and pain secondary to heart failure exacerbation. PMH is significant for HFmrEF, CAD on ASA and Plavis s/p CABG x4 (LIMA to LAD, SVG to OM, SVG to PDA, and SVG to diagonal), PAD, Afib.    BLE edema and Shortness of breath concerning for HFmrEF Exacerbation Large Pleural Effusions Pt presented to the ED at [facility] earlier today and had labs significant for: Cr elevation to 1.4 (appears baseline is 1.3-1.4), BNP 714, d-dimer 0.75, trop 39>35, no presence of leukocytosis or anemia. VAS/ABI showed presence of pulses with severe arterial disease without DVT. CXR shows significant edema with no cardiomegaly and left foot XR does not show evidence of bone destruction. Afib present on EKG. It appears that the new onset swelling is secondary to fluid overload as the patient is hypervolemic on examination with a history of HFmrEF (echo in June 2021 showed EF of 45 to 50% with inferior and inferolateral hypokinesis, moderate to severe mitral regurgitation, mild/moderate tricuspid regurgitation). Patient was lost to follow up with cardiology but has been taking Lasix 20 mg daily which has not been sufficient in maintaining volume status, appears dry weight at that time was around 129 in 2019. Less likely causes of intermittent SOB would include PE (well's score 0 with no DVT on LE U/S), COPD (no longer a smoker and has no formal diagnosis or history of inhaler use),  infectious (no focal diminishment or PNA evident on CXR). There was also a concern for cellulitis in the LLE (no evidence of osteomyelitis on XR) in the ED. However, not as likely with lack of leukocytosis, infectious/systemic symptoms, or fever, so will continue with monitoring as skin changes likely due to venous stasis. In the ED, patient was given Lasix 40 mg IV and Vancomycin 1g.  -- Admit to FPTS (med-tele), attending Dr. Jennette Kettle -- Strict I/Os  -- Daily weights -- s/p lasix 40 mg IV -- Continue lasix 40 mg BID; can adjust as needed -- Will consult IR re: possible drain to pleural effusion  -- Continue Coreg, ARB -- Tylenol 650 q6prn for pain  -- Consult to cardiology, appreciate recommendations  -- Will hold off on Echo until discussion with IR re: possible drain as this could affect EF  -- Hold further abx  -- AM mag, CBC, BMP -- PT/OT eval  -- Fall precautions  -- Continuous cardiac monitoring   CKD Stage 3b: Chronic, stable Appears close to baseline but difficult to assess (appears between 1.3-1.4). Will monitor kidney function with diuresis.   Severe PAD  H/o CABG x4  Ischemic Cardiomyopathy  Patient has PAD but still has pulses in BLE, currently on Plavix and ASA at home. EKG as above. Had questionable cellulitis in ED and received a dose of Vancomycin in the ED. Given that he has been afebrile, normal WBC, CRP <5, we will hold on further antibiotics at this time. His skin changes could be chronic and related to his PAD. There was no evidence of  [name] [name]  Date of Exam:   03/17/2022 Medical Rec #: [id number]    Accession #:    [id number] Date of Birth: [birth year]    Patient Gender: M Patient Age:   81 years Exam Location:  [facility] Procedure:      VAS [place] LOWER EXTREMITY VENOUS (DVT) Referring Phys: Ernie Avena --------------------------------------------------------------------------------  Indications: Bilateral pitting edema.  Comparison Study: No prior study Performing Technologist: Gertie Fey MHA, RDMS, RVT, RDCS  Examination Guidelines: A complete evaluation includes B-mode imaging, spectral Doppler, color Doppler, and power Doppler as needed of all accessible portions of each vessel. Bilateral testing is considered an integral part of a complete examination. Limited examinations for reoccurring  indications may be performed as noted. The reflux portion of the exam is performed with the patient in reverse Trendelenburg.  +---------+---------------+---------+-----------+----------+--------------+ RIGHT    CompressibilityPhasicitySpontaneityPropertiesThrombus Aging +---------+---------------+---------+-----------+----------+--------------+ CFV      Full           No       Yes                                 +---------+---------------+---------+-----------+----------+--------------+ SFJ      Full                                                        +---------+---------------+---------+-----------+----------+--------------+ FV Prox  Full                                                        +---------+---------------+---------+-----------+----------+--------------+ FV Mid   Full                                                        +---------+---------------+---------+-----------+----------+--------------+ FV DistalFull                                                        +---------+---------------+---------+-----------+----------+--------------+ PFV      Full                                                        +---------+---------------+---------+-----------+----------+--------------+ POP      Full           No       Yes                                 +---------+---------------+---------+-----------+----------+--------------+ PTV      Full                                                        +---------+---------------+---------+-----------+----------+--------------+  TOTAL) BY MOUTH 2 (TWO) TIMES DAILY. TAKE 0.5 TABLETS BY MOUTH DAILY. 90 tablet 3   valsartan (DIOVAN) 160 MG tablet TAKE 1/2 TABLET DAILY FOR ONE WEEK, THEN INCREASE TO 1 WHOLE TABLET DAILY. 90 tablet 3    Objective: BP (!) 135/98   Pulse 86   Temp (!) 97.5 F (36.4 C) (Oral)   Resp 16   Ht 5\' 2"  (1.575 m)   Wt 66.2 kg   SpO2 99%   BMI 26.70 kg/m  Exam: General: Pleasant, elderly, frail man in no acute distress, nontoxic in appearance  Eyes: Tracking appropriately and PERRLA ENTM/Neck: FROM without evidence of JVD or LAD Cardiovascular: Irregular rhythm with regular rate, no murmurs/gallops/rubs  Respiratory: Bibasilar crackles bilaterally with no evidence of retractions, wheezing, rhonchi  Gastrointestinal: No distension or abdominal fluid wave noted, no TTP, BS normoactive  MSK: Able to move all extremities, weeping edema of BLE to knee with chronic venous stasis skin changes, continued pitting edema to the hip bilaterally Derm: Chronic kin changes over hands and BLE with erythematous changes and swelling of the dorsum of left foot. Onychomycosis of toenails. One small ulceration, clean and dry on RLE.     Neuro: A&Ox3 without obvious neurological deficits  Psych: Appropriate mood and behavior   Labs and Imaging: CBC BMET  Recent Labs  Lab 03/17/22 0807  WBC 6.9  HGB 13.6  HCT 42.1  PLT 160   Recent Labs  Lab 03/17/22 0807  NA 143  K 4.1  CL 113*  CO2 23  BUN 27*  CREATININE 1.40*  GLUCOSE 99  CALCIUM 10.1     EKG: A Fib with  PVCs, rate 98 bpm  DG Chest 2 View  Result Date: 03/17/2022 CLINICAL DATA:  Shortness of breath and bilateral hand and leg swelling. EXAM: CHEST - 2 VIEW COMPARISON:  03/16/2022 FINDINGS: The heart is within normal limits in size. Stable tortuosity and calcification of the thoracic aorta. Stable surgical changes from triple bypass surgery and placement of left atrial appendage closure device. Persistent bilateral pleural effusions and overlying bibasilar atelectasis. Mild vascular congestion and possible mild interstitial edema. The bony thorax is intact. IMPRESSION: Persistent bilateral pleural effusions and overlying bibasilar atelectasis. Probable mild interstitial edema. Electronically Signed   By: Rudie Meyer M.D.   On: 03/17/2022 08:36   DG Foot Complete Left  Result Date: 03/17/2022 CLINICAL DATA:  foot infection EXAM: LEFT FOOT - COMPLETE 3+ VIEW COMPARISON:  None. FINDINGS: There is severe left foot soft tissue swelling. Osteopenia. There is a probable old posttraumatic deformity of the second digit proximal phalanx. There is no frank bony destruction or evidence of acute fracture. IMPRESSION: Severe left foot soft tissue swelling. No evidence of acute fracture or frank bony destruction. Electronically Signed   By: Caprice Renshaw M.D.   On: 03/17/2022 11:54   VAS [place] ABI WITH/WO TBI  Result Date: 03/17/2022  LOWER EXTREMITY DOPPLER STUDY Patient Name:  [name] [name]  Date of Exam:   03/17/2022 Medical Rec #: [id number]    Accession #:    [id number] Date of Birth: 03/14/1941    Patient Gender: M Patient Age:   89 years Exam Location:  [facility] Procedure:      VAS [place] ABI WITH/WO TBI Referring Phys: Ernie Avena --------------------------------------------------------------------------------  Indications: Peripheral artery disease.  Limitations: Today's exam was limited due to Significant pitting edema. Comparison Study: Pre- CABG Dopplers 03/04/2016- Right ABI=1.01, left ABI=0.53 Performing  Technologist: Gertie Fey MHA, RVT, RDCS,  TOTAL) BY MOUTH 2 (TWO) TIMES DAILY. TAKE 0.5 TABLETS BY MOUTH DAILY. 90 tablet 3   valsartan (DIOVAN) 160 MG tablet TAKE 1/2 TABLET DAILY FOR ONE WEEK, THEN INCREASE TO 1 WHOLE TABLET DAILY. 90 tablet 3    Objective: BP (!) 135/98   Pulse 86   Temp (!) 97.5 F (36.4 C) (Oral)   Resp 16   Ht 5\' 2"  (1.575 m)   Wt 66.2 kg   SpO2 99%   BMI 26.70 kg/m  Exam: General: Pleasant, elderly, frail man in no acute distress, nontoxic in appearance  Eyes: Tracking appropriately and PERRLA ENTM/Neck: FROM without evidence of JVD or LAD Cardiovascular: Irregular rhythm with regular rate, no murmurs/gallops/rubs  Respiratory: Bibasilar crackles bilaterally with no evidence of retractions, wheezing, rhonchi  Gastrointestinal: No distension or abdominal fluid wave noted, no TTP, BS normoactive  MSK: Able to move all extremities, weeping edema of BLE to knee with chronic venous stasis skin changes, continued pitting edema to the hip bilaterally Derm: Chronic kin changes over hands and BLE with erythematous changes and swelling of the dorsum of left foot. Onychomycosis of toenails. One small ulceration, clean and dry on RLE.     Neuro: A&Ox3 without obvious neurological deficits  Psych: Appropriate mood and behavior   Labs and Imaging: CBC BMET  Recent Labs  Lab 03/17/22 0807  WBC 6.9  HGB 13.6  HCT 42.1  PLT 160   Recent Labs  Lab 03/17/22 0807  NA 143  K 4.1  CL 113*  CO2 23  BUN 27*  CREATININE 1.40*  GLUCOSE 99  CALCIUM 10.1     EKG: A Fib with  PVCs, rate 98 bpm  DG Chest 2 View  Result Date: 03/17/2022 CLINICAL DATA:  Shortness of breath and bilateral hand and leg swelling. EXAM: CHEST - 2 VIEW COMPARISON:  03/16/2022 FINDINGS: The heart is within normal limits in size. Stable tortuosity and calcification of the thoracic aorta. Stable surgical changes from triple bypass surgery and placement of left atrial appendage closure device. Persistent bilateral pleural effusions and overlying bibasilar atelectasis. Mild vascular congestion and possible mild interstitial edema. The bony thorax is intact. IMPRESSION: Persistent bilateral pleural effusions and overlying bibasilar atelectasis. Probable mild interstitial edema. Electronically Signed   By: Rudie Meyer M.D.   On: 03/17/2022 08:36   DG Foot Complete Left  Result Date: 03/17/2022 CLINICAL DATA:  foot infection EXAM: LEFT FOOT - COMPLETE 3+ VIEW COMPARISON:  None. FINDINGS: There is severe left foot soft tissue swelling. Osteopenia. There is a probable old posttraumatic deformity of the second digit proximal phalanx. There is no frank bony destruction or evidence of acute fracture. IMPRESSION: Severe left foot soft tissue swelling. No evidence of acute fracture or frank bony destruction. Electronically Signed   By: Caprice Renshaw M.D.   On: 03/17/2022 11:54   VAS [place] ABI WITH/WO TBI  Result Date: 03/17/2022  LOWER EXTREMITY DOPPLER STUDY Patient Name:  [name] [name]  Date of Exam:   03/17/2022 Medical Rec #: [id number]    Accession #:    [id number] Date of Birth: 03/14/1941    Patient Gender: M Patient Age:   89 years Exam Location:  [facility] Procedure:      VAS [place] ABI WITH/WO TBI Referring Phys: Ernie Avena --------------------------------------------------------------------------------  Indications: Peripheral artery disease.  Limitations: Today's exam was limited due to Significant pitting edema. Comparison Study: Pre- CABG Dopplers 03/04/2016- Right ABI=1.01, left ABI=0.53 Performing  Technologist: Gertie Fey MHA, RVT, RDCS,  [facility] Admission History and Physical Service Pager: [phone]  Patient name: [name] [name] Medical record number: [id number] Date of birth: 1941/10/27 Age: 81 y.o. Gender: male  Primary Care Provider: Joycelyn Rua, MD Consultants: Cardiology  Code Status: FULL Preferred Emergency Contact:  Contact Information     Name Relation Home Work Mobile   [place] Spouse [phone]        Chief Complaint: LE edema and pain   Assessment and Plan: [name] [name] is a 81 y.o. male presenting with BLE swelling and pain secondary to heart failure exacerbation. PMH is significant for HFmrEF, CAD on ASA and Plavis s/p CABG x4 (LIMA to LAD, SVG to OM, SVG to PDA, and SVG to diagonal), PAD, Afib.    BLE edema and Shortness of breath concerning for HFmrEF Exacerbation Large Pleural Effusions Pt presented to the ED at [facility] earlier today and had labs significant for: Cr elevation to 1.4 (appears baseline is 1.3-1.4), BNP 714, d-dimer 0.75, trop 39>35, no presence of leukocytosis or anemia. VAS/ABI showed presence of pulses with severe arterial disease without DVT. CXR shows significant edema with no cardiomegaly and left foot XR does not show evidence of bone destruction. Afib present on EKG. It appears that the new onset swelling is secondary to fluid overload as the patient is hypervolemic on examination with a history of HFmrEF (echo in June 2021 showed EF of 45 to 50% with inferior and inferolateral hypokinesis, moderate to severe mitral regurgitation, mild/moderate tricuspid regurgitation). Patient was lost to follow up with cardiology but has been taking Lasix 20 mg daily which has not been sufficient in maintaining volume status, appears dry weight at that time was around 129 in 2019. Less likely causes of intermittent SOB would include PE (well's score 0 with no DVT on LE U/S), COPD (no longer a smoker and has no formal diagnosis or history of inhaler use),  infectious (no focal diminishment or PNA evident on CXR). There was also a concern for cellulitis in the LLE (no evidence of osteomyelitis on XR) in the ED. However, not as likely with lack of leukocytosis, infectious/systemic symptoms, or fever, so will continue with monitoring as skin changes likely due to venous stasis. In the ED, patient was given Lasix 40 mg IV and Vancomycin 1g.  -- Admit to FPTS (med-tele), attending Dr. Jennette Kettle -- Strict I/Os  -- Daily weights -- s/p lasix 40 mg IV -- Continue lasix 40 mg BID; can adjust as needed -- Will consult IR re: possible drain to pleural effusion  -- Continue Coreg, ARB -- Tylenol 650 q6prn for pain  -- Consult to cardiology, appreciate recommendations  -- Will hold off on Echo until discussion with IR re: possible drain as this could affect EF  -- Hold further abx  -- AM mag, CBC, BMP -- PT/OT eval  -- Fall precautions  -- Continuous cardiac monitoring   CKD Stage 3b: Chronic, stable Appears close to baseline but difficult to assess (appears between 1.3-1.4). Will monitor kidney function with diuresis.   Severe PAD  H/o CABG x4  Ischemic Cardiomyopathy  Patient has PAD but still has pulses in BLE, currently on Plavix and ASA at home. EKG as above. Had questionable cellulitis in ED and received a dose of Vancomycin in the ED. Given that he has been afebrile, normal WBC, CRP <5, we will hold on further antibiotics at this time. His skin changes could be chronic and related to his PAD. There was no evidence of  [name] [name]  Date of Exam:   03/17/2022 Medical Rec #: [id number]    Accession #:    [id number] Date of Birth: [birth year]    Patient Gender: M Patient Age:   81 years Exam Location:  [facility] Procedure:      VAS [place] LOWER EXTREMITY VENOUS (DVT) Referring Phys: Ernie Avena --------------------------------------------------------------------------------  Indications: Bilateral pitting edema.  Comparison Study: No prior study Performing Technologist: Gertie Fey MHA, RDMS, RVT, RDCS  Examination Guidelines: A complete evaluation includes B-mode imaging, spectral Doppler, color Doppler, and power Doppler as needed of all accessible portions of each vessel. Bilateral testing is considered an integral part of a complete examination. Limited examinations for reoccurring  indications may be performed as noted. The reflux portion of the exam is performed with the patient in reverse Trendelenburg.  +---------+---------------+---------+-----------+----------+--------------+ RIGHT    CompressibilityPhasicitySpontaneityPropertiesThrombus Aging +---------+---------------+---------+-----------+----------+--------------+ CFV      Full           No       Yes                                 +---------+---------------+---------+-----------+----------+--------------+ SFJ      Full                                                        +---------+---------------+---------+-----------+----------+--------------+ FV Prox  Full                                                        +---------+---------------+---------+-----------+----------+--------------+ FV Mid   Full                                                        +---------+---------------+---------+-----------+----------+--------------+ FV DistalFull                                                        +---------+---------------+---------+-----------+----------+--------------+ PFV      Full                                                        +---------+---------------+---------+-----------+----------+--------------+ POP      Full           No       Yes                                 +---------+---------------+---------+-----------+----------+--------------+ PTV      Full                                                        +---------+---------------+---------+-----------+----------+--------------+

## 2022-03-17 NOTE — Progress Notes (Signed)
?  Echocardiogram ?2D Echocardiogram has been performed. ? ?[name] [name] ?03/17/2022, 2:47 PM ?

## 2022-03-17 NOTE — ED Notes (Signed)
Pt transported to IR 

## 2022-03-17 NOTE — Consult Note (Addendum)
?Cardiology Consultation:  ? ?Patient ID: [name] [name] ?MRN: [id number]; DOB: [birth year] ? ?Admit date: 03/17/2022 ?Date of Consult: 03/17/2022 ? ?PCP:  Orpah Melter, MD ?  ?[facility] Providers ?Cardiologist:  Shelva Majestic, MD   {  ? ? ?Patient Profile:  ? ?[name] [name] is a 81 y.o. male with a hx of HTN, chronic systolic and diastolic heart failure, multivessel CAD s/p CABGx4, permeant atrial fibrillation, ischemic cardiomyopathy, HTN, PAD,  who is being seen 03/17/2022 for the evaluation of CHF at the request of Dr.Espinoza.  ? ?History of Present Illness:  ? ?[name] [name] presented to the ER today with 2 months duration of worsening bilateral hands and legs swelling.  He reports progressively worsening of shortness of breath with exertion.  He endorses orthopnea, PND, weight gain >10 pounds over 2 month.  He has not followed up with cardiology due to [place] since 2021.  He has been taking Lasix 40 mg daily however, not longer taking spironolactone. He denied any chest pain, heart palpitation. He states his hands swelling is already improving. He denied any tobacco use, does not consume salty food.  ? ?Admission diagnostic revealed elevated creatinine 1.4 and GFR 50 albumin 3, BNP elevated 714. HS troponin 39 >35.  CRP <0.5.  CBC revealed macrocytosis.  D-dimer elevated 0.75. Flu and COVID negative. CXR showed Persistent bilateral pleural effusions and overlying bibasilar atelectasis. Probable mild interstitial edema. Left foot x-ray without fracture. VAS/ABI showed presence of pulses with severe arterial disease without DVT. ? ?He was admitted to family medicine for decompensated heart failure, started on IV Lasix 40 mg twice daily, underwent successful right thoracentesis with 1.3 L clear yellow pleural fluid drained out.  Echocardiogram is pending and cardiology is consulted for further input.  ? ? ?In brief, patient follows cardiology Dr Claiborne Billings outpatient, had multivessel CAD s/p CABGx4 by Dr. Tharon Aquas Tright [place]  2017 with a LIMA to LAD, SVG to OM, SVG to PDA, and SVG to diagonal. He had underwent clipping of the left atrial appendage and left carotid endarterectomy. He had required thoracentesis for pleural effusion and developed post-operative A fib following the CABG.  ? ?He has permanent atrial fibrillation, underwent cardioversion with restoration of sinus rhythm 04/2016 and had recurrence later. He was on eliquis at one point that caused significant nose bleeding. Resuming anticoagulation was discussed several encounters by Dr Claiborne Billings over the years but patient refused, eventually maintained on aspirin and plavix. He had stopped amiodarone and continued coreg over the years, although mostly unaware of his arrhythmia.  ? ?Echo from 09/17/2017 showed depression of LV function with an EF of 25-30% with diffuse hypocontractility and akinesis of the inferolateral wall.  He had grade 3 diastolic dysfunction.  There was moderate mitral regurgitation, severe LA dilation, moderate RA dilation, and moderate aortic insufficiency.  PA pressure was significant increased at 85 mmHg. He was placed on GDMT over the years. Repeat Echo last on 05/2020 showed recovery of EF 45-50% with inferior to inferolateral hypokinesis.  Severe biatrial enlargement.  Moderate to severe mitral regurgitation, mild to moderate tricuspid regurgitation and mild aortic insufficiency. He had stopped Entresto due to cost issue in the past, currently is on valsartan, carvedilol, spironolactone, and lasix.  ? ?He was last seen by Dr Claiborne Billings on 12/13/20 in the office, complained leg edema R>L, continue unaware of any arrhythmia. He was up-tirtiate on spironolactone and recommended compression stocking. He was then lost to follow up with cardiology.  ? ? ? ? ?Past Medical  History:  ?Diagnosis Date  ? Atrial fibrillation ([place]) 03/21/2016  ? ? ?Past Surgical History:  ?Procedure Laterality Date  ? CARDIAC CATHETERIZATION N/A 03/04/2016  ? Procedure: Right/Left Heart Cath  and Coronary Angiography;  Surgeon: Troy Sine, MD;  Location: [place] [facility];  Service: Cardiovascular;  Laterality: N/A;  ? CARDIOVERSION N/A 04/16/2016  ? Procedure: CARDIOVERSION;  Surgeon: Jerline Pain, MD;  Location: [facility];  Service: Cardiovascular;  Laterality: N/A;  ? CLIPPING OF ATRIAL APPENDAGE Left 03/10/2016  ? Procedure: CLIPPING OF ATRIAL APPENDAGE;  Surgeon: Ivin Poot, MD;  Location: [place];  Service: Open Heart Surgery;  Laterality: Left;  ? CORONARY ARTERY BYPASS GRAFT N/A 03/10/2016  ? Procedure: CORONARY ARTERY BYPASS GRAFTING (CABG)TIMES 4 USING LEFT INTERNAL MAMMARY AND BILATERAL GREATER SAPHENOUS VEIN HARVESTED BY ENDOVIEN;  Surgeon: Ivin Poot, MD;  Location: [place];  Service: Open Heart Surgery;  Laterality: N/A;  ? ENDARTERECTOMY Left 03/10/2016  ? Procedure: LEFT ENDARTERECTOMY CAROTID DACRON PATCH ANGIOPLASTY;  Surgeon: Elam Dutch, MD;  Location: [place];  Service: Vascular;  Laterality: Left;  ? TEE WITHOUT CARDIOVERSION N/A 03/10/2016  ? Procedure: TRANSESOPHAGEAL ECHOCARDIOGRAM (TEE);  Surgeon: Ivin Poot, MD;  Location: [place];  Service: Open Heart Surgery;  Laterality: N/A;  ?  ? ?Home Medications:  ?Prior to Admission medications   ?Medication Sig Start Date End Date Taking? Authorizing Provider  ?Ascorbic Acid (VITAMIN C) 1000 MG tablet Take 1,000 mg by mouth daily.    [provider]  ?aspirin 81 MG tablet Take 81 mg by mouth every Monday, Wednesday, and Friday.    [provider]  ?atorvastatin (LIPITOR) 80 MG tablet TAKE 1 TABLET (80 MG TOTAL) BY MOUTH DAILY AT 6 PM. 07/01/21   Troy Sine, MD  ?carvedilol (COREG) 25 MG tablet Take 1 tablet (25 mg total) by mouth in the morning and at bedtime. 11/25/20   Troy Sine, MD  ?clopidogrel (PLAVIX) 75 MG tablet TAKE 1 TABLET BY MOUTH EVERY DAY 11/06/20   Troy Sine, MD  ?furosemide (LASIX) 20 MG tablet Take 1 tablet (20 mg total) by mouth daily. 07/23/20   Troy Sine, MD   ?magnesium 30 MG tablet Take 30 mg by mouth daily.    [provider]  ?pantoprazole (PROTONIX) 40 MG tablet Take 1 tablet (40 mg total) by mouth daily. PLEASE MAKE APPOINTMENT FOR FURTHER REFILLS 01/28/17   Troy Sine, MD  ?spironolactone (ALDACTONE) 25 MG tablet TAKE 0.5 TABLETS (12.5 MG TOTAL) BY MOUTH 2 (TWO) TIMES DAILY. TAKE 0.5 TABLETS BY MOUTH DAILY. 11/26/20   Troy Sine, MD  ?valsartan (DIOVAN) 160 MG tablet TAKE 1/2 TABLET DAILY FOR ONE WEEK, THEN INCREASE TO 1 WHOLE TABLET DAILY. 05/17/20   Troy Sine, MD  ? ? ?Inpatient Medications: ?Scheduled Meds: ? [START ON 03/18/2022] aspirin  81 mg Oral Q M,W,F  ? [START ON 03/18/2022] atorvastatin  80 mg Oral q1800  ? [START ON 03/18/2022] carvedilol  25 mg Oral BID WC  ? [START ON 03/18/2022] clopidogrel  75 mg Oral Daily  ? enoxaparin (LOVENOX) injection  40 mg Subcutaneous Q24H  ? furosemide  40 mg Intravenous BID  ? [START ON 03/18/2022] irbesartan  150 mg Oral Daily  ? lidocaine      ? ?Continuous Infusions: ? ?PRN Meds: ?acetaminophen **OR** acetaminophen, lidocaine ? ?Allergies:   No Known Allergies ? ?Social History:   ?Social History  ? ?Socioeconomic History  ?  Marital status: Married  ?  Spouse name: Not on file  ? Number of children: Not on file  ? Years of education: Not on file  ? Highest education level: Not on file  ?Occupational History  ? Not on file  ?Tobacco Use  ? Smoking status: Former  ? Smokeless tobacco: Former  ?Substance and Sexual Activity  ? Alcohol use: Not Currently  ? Drug use: Not Currently  ? Sexual activity: Not on file  ?Other Topics Concern  ? Not on file  ?Social History Narrative  ? Not on file  ? ?Social Determinants of Health  ? ?Financial Resource Strain: Not on file  ?Food Insecurity: Not on file  ?Transportation Needs: Not on file  ?Physical Activity: Not on file  ?Stress: Not on file  ?Social Connections: Not on file  ?Intimate Partner Violence: Not on file  ?  ?Family History:   ?HTN ? ?ROS:   ?Constitutional: Denied fever, chills, malaise, night sweats ?Eyes: Denied vision change or loss ?Ears/Nose/Mouth/Throat: Denied ear ache, sore throat, coughing, sinus pain ?Cardiovascular: see HPI  ?Respiratory: see HPI

## 2022-03-18 ENCOUNTER — Other Ambulatory Visit (HOSPITAL_COMMUNITY): Payer: Self-pay

## 2022-03-18 ENCOUNTER — Inpatient Hospital Stay (HOSPITAL_COMMUNITY): Payer: Medicare HMO

## 2022-03-18 DIAGNOSIS — I5041 Acute combined systolic (congestive) and diastolic (congestive) heart failure: Secondary | ICD-10-CM | POA: Diagnosis not present

## 2022-03-18 DIAGNOSIS — I4811 Longstanding persistent atrial fibrillation: Secondary | ICD-10-CM

## 2022-03-18 DIAGNOSIS — M7989 Other specified soft tissue disorders: Secondary | ICD-10-CM | POA: Diagnosis not present

## 2022-03-18 DIAGNOSIS — I340 Nonrheumatic mitral (valve) insufficiency: Secondary | ICD-10-CM | POA: Diagnosis not present

## 2022-03-18 DIAGNOSIS — R600 Localized edema: Secondary | ICD-10-CM | POA: Diagnosis not present

## 2022-03-18 DIAGNOSIS — I255 Ischemic cardiomyopathy: Secondary | ICD-10-CM | POA: Diagnosis not present

## 2022-03-18 DIAGNOSIS — I6529 Occlusion and stenosis of unspecified carotid artery: Secondary | ICD-10-CM

## 2022-03-18 DIAGNOSIS — J90 Pleural effusion, not elsewhere classified: Secondary | ICD-10-CM | POA: Diagnosis not present

## 2022-03-18 LAB — CBC
HCT: 44.7 % (ref 39.0–52.0)
Hemoglobin: 15 g/dL (ref 13.0–17.0)
MCH: 35.1 pg — ABNORMAL HIGH (ref 26.0–34.0)
MCHC: 33.6 g/dL (ref 30.0–36.0)
MCV: 104.7 fL — ABNORMAL HIGH (ref 80.0–100.0)
Platelets: 175 10*3/uL (ref 150–400)
RBC: 4.27 MIL/uL (ref 4.22–5.81)
RDW: 15.9 % — ABNORMAL HIGH (ref 11.5–15.5)
WBC: 8.3 10*3/uL (ref 4.0–10.5)
nRBC: 0 % (ref 0.0–0.2)

## 2022-03-18 LAB — BASIC METABOLIC PANEL
Anion gap: 8 (ref 5–15)
BUN: 27 mg/dL — ABNORMAL HIGH (ref 8–23)
CO2: 24 mmol/L (ref 22–32)
Calcium: 10.3 mg/dL (ref 8.9–10.3)
Chloride: 108 mmol/L (ref 98–111)
Creatinine, Ser: 1.48 mg/dL — ABNORMAL HIGH (ref 0.61–1.24)
GFR, Estimated: 47 mL/min — ABNORMAL LOW (ref >60)
Glucose, Bld: 83 mg/dL (ref 70–99)
Potassium: 4 mmol/L (ref 3.5–5.1)
Sodium: 140 mmol/L (ref 135–145)

## 2022-03-18 LAB — MAGNESIUM: Magnesium: 1.9 mg/dL (ref 1.7–2.4)

## 2022-03-18 LAB — CYTOLOGY - NON PAP

## 2022-03-18 MED ORDER — ADULT MULTIVITAMIN W/MINERALS CH
1.0000 | ORAL_TABLET | Freq: Every day | ORAL | Status: DC
  Administered 2022-03-18 – 2022-03-25 (×7): 1 via ORAL
  Filled 2022-03-18 (×7): qty 1

## 2022-03-18 MED ORDER — ASPIRIN 81 MG PO CHEW
81.0000 mg | CHEWABLE_TABLET | Freq: Every day | ORAL | Status: DC
Start: 2022-03-18 — End: 2022-03-25
  Administered 2022-03-18 – 2022-03-25 (×7): 81 mg via ORAL
  Filled 2022-03-18 (×7): qty 1

## 2022-03-18 NOTE — Progress Notes (Signed)
Family Medicine Teaching Service ?Daily Progress Note ?Intern Pager: [phone] ? ?Patient name: [name] [name] Medical record number: [id number] ?Date of birth: 1941-11-17 Age: 81 y.o. Gender: male ? ?Primary Care Provider: Orpah Melter, MD ?Consultants: VVS, Cardiology ?Code Status: Full ? ?Pt Overview and Major Events to Date:  ?4/4 admitted ? ?Assessment and Plan: ?[name] [name] is a 81 y.o. male presenting with BLE swelling and pain secondary to heart failure exacerbation. PMH is significant for HFmrEF, CAD on ASA and Plavis s/p CABG x4 (LIMA to LAD, SVG to OM, SVG to PDA, and SVG to diagonal), PAD, Afib.  ? ?BLE edema and Shortness of breath likely 2/2 HFmrEF Exacerbation ?Large Pleural Effusions ?VSS on room air. UOP 1.5 L, 1.3 L right thoracentesis, net output 2.6 L. Dry weight ~55 kg. Current weight 65 kg.  Magnesium normal.  CBC normal.  Upper extremities are less swollen today. Creatinine 1.40>1.48.  Reports doing well. Denies shortness of breath, chest pain today.  ?-Status post right thoracentesis ?-IV lasix 40 mg bid ?-AM BMP ?-Strict I's/O's ?-Daily weights ?-IR consulted, appreciate recommendations ?-Cardiology consulted, appreciate recommendations ?-Echo after pleural effusions improve ?-PT/OT eval ?-Tylenol 650 every 6 hours as needed for pain ?-Continue cardiac monitoring ? ?Severe PAD  H/o CABG x4  Ischemic Cardiomyopathy  ?Hx of 60% carotid occlusion s/p left CEA ?VVS saw patient yesterday and recommended arteriogram either in hospital or after.  The left leg significantly more swollen than right. ?-Vascular following, appreciate recommendations ?-Continue DAPT with Plavix and aspirin ?-Elevate legs with 1-2 pillows ? ?CKD Stage 3b: Chronic, stable ?Mild creatinine increased to 1.48.  We will continue to monitor kidney function with diuresis ? ?Atrial Fibrillation  ?Currently refusing anticoagulation due to cost as well as bleeding risk.  Currently not in RVR. ?-Cardiology consulted, appreciate  recommendations ?-Continue Plavix and aspirin ?-Continuous cardiac monitoring ? ?Urinary Frequency  ?BPH ?Denies dysuria. Notes urination every hour. No previous history of diabetes with glucose of 99 today, so unlikely. ?-- Monitor, likely will need condom catheter  ?-- Could consider getting PSA and starting Tamsulosin once more euvolemic  ? ?HLD: chronic, stable ?-Lipitor 80 mg qd ? ?HTN: chronic, stable ?BP 140s/100s ?Home med: coreg 25 mg bid w/ meals, irbesartan 150 mg daily, Aldactone 25 mg daily. ?-Continue Coreg, irbesartan, and Aldactone ? ?Macrocytosis  ?-AM CBC ?-Multivitamin ? ?FEN/GI: Regular ?PPx: Lovenox ?Dispo:Pending PT recommendations  in 2-3 days. Barriers include ongoing medical treatment.  ? ?Subjective:  ?Patient seen and assessed at bedside.  Patient denies any acute concerns at this time.  Patient reports his hand swelling has gone down drastically since yesterday.  Patient still has very swollen lower extremities; however, patient denies any sensory changes in lower extremities.  All questions addressed. ? ?Objective: ?Temp:  [97.5 ?F (36.4 ?C)-98.6 ?F (37 ?C)] 97.6 ?F (36.4 ?C) (04/05 0352) ?Pulse Rate:  [69-131] 75 (04/05 0352) ?Resp:  [16-24] 20 (04/05 0352) ?BP: (109-176)/(70-108) 141/106 (04/05 0352) ?SpO2:  [89 %-100 %] 95 % (04/05 0352) ?Weight:  [63.2 kg-66.2 kg] 65 kg (04/05 0346) ?Physical Exam: ?General: Resting comfortably, NAD ?Cardiovascular: Irregular rhythm, regular rate ?Respiratory: Muffled lung in lower left quadrant, clear in other quadrants ?Abdomen: soft, nontender to palpation ?Extremities: Warm, dry.  3+ pitting edema in bilateral lower extremities more prominent on left lower extremity. ? ?Laboratory: ?Recent Labs  ?Lab 03/17/22 ?0807 03/18/22 ?0331  ?WBC 6.9 8.3  ?HGB 13.6 15.0  ?HCT 42.1 44.7  ?PLT 160 175  ? ?Recent Labs  ?Lab 03/17/22 ?  [id number] 03/17/22 ?1034 03/18/22 ?0331  ?NA 143  --  140  ?K 4.1  --  4.0  ?CL 113*  --  108  ?CO2 23  --  24  ?BUN 27*  --  27*   ?CREATININE 1.40*  --  1.48*  ?CALCIUM 10.1  --  10.3  ?PROT  --  6.5  --   ?BILITOT  --  1.2  --   ?ALKPHOS  --  93  --   ?ALT  --  24  --   ?AST  --  31  --   ?GLUCOSE 99  --  83  ? ? ? ? ?Imaging/Diagnostic Tests: ?DG Chest 1 View ? ?Result Date: 03/17/2022 ?CLINICAL DATA:  Status post right thoracentesis EXAM: CHEST  1 VIEW COMPARISON:  Prior chest x-ray 03/17/2022 FINDINGS: Significant interval reduction and right-sided pleural effusion. No evidence of pneumothorax. Persistent left pleural effusion. Stable cardiomegaly. Tortuous and atherosclerotic thoracic aorta. Surgical changes of prior median sternotomy for multivessel CABG. Left atrial appendage ligation clip in place. No acute osseous abnormality. IMPRESSION: No evidence of pneumothorax following right-sided pleural effusion. Significant decrease in right pleural fluid volume. Electronically Signed   By: Jacqulynn Cadet M.D.   On: 03/17/2022 15:31  ? ?DG Foot Complete Left ? ?Result Date: 03/17/2022 ?CLINICAL DATA:  foot infection EXAM: LEFT FOOT - COMPLETE 3+ VIEW COMPARISON:  None. FINDINGS: There is severe left foot soft tissue swelling. Osteopenia. There is a probable old posttraumatic deformity of the second digit proximal phalanx. There is no frank bony destruction or evidence of acute fracture. IMPRESSION: Severe left foot soft tissue swelling. No evidence of acute fracture or frank bony destruction. Electronically Signed   By: Maurine Simmering M.D.   On: 03/17/2022 11:54  ? ?VAS [place] ABI WITH/WO TBI ? ?Result Date: 03/17/2022 ? LOWER EXTREMITY DOPPLER STUDY Patient Name:  [name] [name]  Date of Exam:   03/17/2022 Medical Rec #: [id number]    Accession #:    [id number] Date of Birth: [birth year]    Patient Gender: M Patient Age:   45 years Exam Location:  [facility] Procedure:      VAS [place] ABI WITH/WO TBI Referring Phys: Regan Lemming --------------------------------------------------------------------------------  Indications: Peripheral artery disease.   Limitations: Today's exam was limited due to Significant pitting edema. Comparison Study: Pre- CABG Dopplers 03/04/2016- Right ABI=1.01, left ABI=0.53 Performing Technologist: Maudry Mayhew MHA, RVT, RDCS, RDMS  Examination Guidelines: A complete evaluation includes at minimum, Doppler waveform signals and systolic blood pressure reading at the level of bilateral brachial, anterior tibial, and posterior tibial arteries, when vessel segments are accessible. Bilateral testing is considered an integral part of a complete examination. Photoelectric Plethysmograph (PPG) waveforms and toe systolic pressure readings are included as required and additional duplex testing as needed. Limited examinations for reoccurring indications may be performed as noted.  ABI Findings: +---------+------------------+-----+----------+------------------+ Right    Rt Pressure (mmHg)IndexWaveform  Comment            +---------+------------------+-----+----------+------------------+ Brachial 111                    monophasic                   +---------+------------------+-----+----------+------------------+ PTA                                       Unable to insonate +---------+------------------+-----+----------+------------------+ DP  103               0.63 monophasic                   +---------+------------------+-----+----------+------------------+ Great Toe58                0.35                              +---------+------------------+-----+----------+------------------+ +---------+------------------+-----+----------+-------+ Left     Lt Pressure (mmHg)IndexWaveform  Comment +---------+------------------+-----+----------+-------+ Brachial 164                    triphasic         +---------+------------------+-----+----------+-------+ DP       79                0.48 monophasic        +---------+------------------+-----+----------+-------+ Great Toe0                 0.00  Absent            +---------+------------------+-----+----------+-------+ +-------+-----------+-----------+------------+------------+ ABI/TBIToday's ABIToday's TBIPrevious ABIPrevious TBI +-------+-----------+-----------+------------+------------+ Right  0.63       0

## 2022-03-18 NOTE — Progress Notes (Signed)
Carotid duplex has been completed.  ? ?Preliminary results in CV Proc.  ? ?[name] [name] ?03/18/2022 2:31 PM    ?

## 2022-03-18 NOTE — TOC Benefit Eligibility Note (Addendum)
Patient Advocate Encounter ? ?Insurance verification completed.   ? ?The patient is currently admitted and upon discharge could be taking Farxiga 10 mg. ? ?The current 30 day co-pay is, $95.00.  ? ?The patient is currently admitted and upon discharge could be taking Jardiance 10 mg. ? ?The current 30 day co-pay is, $45.00.  ? ?The patient is currently admitted and upon discharge could be taking Entesto 24-26 mg. ? ?The current 30 day co-pay is, $45.00.  ? ?The patient is insured through [employer] Part D  ? ? ? ?Lyndel Safe, CPhT ?Pharmacy Patient Advocate Specialist ?[place] Patient Advocate Team ?Direct Number: [phone]  Fax: [phone] ? ? ? ? ? ?  ?

## 2022-03-18 NOTE — Progress Notes (Signed)
Heart Failure Stewardship Pharmacist Progress Note ? ? ?PCP: Joycelyn Rua, MD ?PCP-Cardiologist: Nicki Guadalajara, MD  ? ? ?HPI:  ?81 yo M with PMH of HTN, CHF, CAD s/p CABG, afib, and PAD. He presented to the ED on 4/4 with edema in legs and hands. CXR with persistent bilateral pleural effusions and probably mild interstitial edema. An ECHO was done on 4/4 and LVEF was 30-35% (was 45-50% in June 2021) with mild LVH and moderately reduced RV. ? ?Current HF Medications: ?Diuretic: furosemide 40 mg IV BID ?Beta Blocker: carvedilol 25 mg BID ?ACE/ARB/ARNI: irbesartan 150 mg daily ?Aldosterone Antagonist: spironolactone 25 mg daily ? ?Prior to admission HF Medications: ?Diuretic: furosemide 20 mg daily ?Beta blocker: carvedilol 25 mg BID ?ACE/ARB/ARNI: valsartan 160 mg daily ?Aldosterone Antagonist: spironolactone 25 mg daily ? ?Pertinent Lab Values: ?Serum creatinine 1.48, BUN 27, Potassium 4.0, Sodium 140, BNP 714.2, Magnesium 1.9  ? ?Vital Signs: ?Weight: 143 lbs (admission weight: 139 lbs) ?Blood pressure: 140/90s ?Heart rate: 70s  ?I/O: - yesterday; net -1.2L ? ?Medication Assistance / Insurance Benefits Check: ?Does the patient have prescription insurance?  Yes ?Type of insurance plan: [employer] Medicare ? ?Outpatient Pharmacy:  ?[facility] ?Is the patient willing to use [facility] TOC pharmacy at discharge? Yes ?Is the patient willing to transition their outpatient pharmacy to utilize a [facility] outpatient [facility]?   Pending ?  ? ?Assessment: ?1. Acute on chronic systolic and diastolic CHF (EF 45-40%), due to ICM. NYHA class III symptoms. ?- Continue furosemide 40 mg IV BID - consider increasing to 80 mg if output remains low ?- Continue carvedilol 25 mg BID ?- Continue irbesartan 150 mg daily - unable to afford Entresto in the past and unwilling to try again. Will counsel about pt assistance option and see if he is more willing to try it. ?- Continue spironolactone 25 mg daily ?-  Consider adding SGLT2i - educate about pt assistance option ?- Consider adding hydral/nitrate if BP uncontrollable  ?  ?Plan: ?1) Medication changes recommended at this time: ?- None - will educate about patient assistance to see if GDMT can be optimized further ?- Consider further increasing IV lasix tomorrow if output does not pick up and weight continues to increase ? ?2) Patient assistance: ?- Entresto copay $45 ?- Jardiance copay $45 ?- Farxiga copay $95 ? ?3)  Education  ?- To be completed prior to discharge ? ?Sharen Hones, PharmD, BCPS ?Heart Failure Stewardship Pharmacist ?Phone [phone] ? ? ?

## 2022-03-18 NOTE — Progress Notes (Addendum)
?  Progress Note ? ?VASCULAR SURGERY ASSESSMENT & PLAN:  ? ?PERIPHERAL ARTERIAL DISEASE: He has evidence of multilevel arterial occlusive disease.  He has a small wound in his right leg but this should improve as his leg swelling resolves.  We can consider arteriography once he is more stable medically.  This could either be done as an inpatient or an outpatient.  Vascular surgery will continue to follow. ? ?HISTORY OF BILATERAL CAROTID DISEASE: He had a left carotid endarterectomy by Dr. Darrick Penna in 2017.  He is due for a follow-up study which has been ordered.  He is on aspirin, a statin, and Plavix. ? ?[name] Caraway, MD ?8:56 AM ? ? ?03/18/2022 ?7:19 AM ?Hospital Day 1 ? ?Subjective:  says his legs feel good; says he did not follow up with [place] after carotid surgery.  Denies any stroke sx ? ?afebrile ? ?Vitals:  ? 03/18/22 0350 03/18/22 0352  ?BP: (!) 143/108 (!) 141/106  ?Pulse: (!) 131 75  ?Resp:  20  ?Temp:  97.6 ?F (36.4 ?C)  ?SpO2: 96% 95%  ? ? ?Physical Exam: ?general  sleeping but wakes easily ?Lungs:  non labored ?Extremities:  BLE edema ? ?CBC ?   ?Component Value Date/Time  ? WBC 8.3 03/18/2022 0331  ? RBC 4.27 03/18/2022 0331  ? HGB 15.0 03/18/2022 0331  ? HGB 15.1 06/06/2020 1142  ? HCT 44.7 03/18/2022 0331  ? HCT 44.6 06/06/2020 1142  ? PLT 175 03/18/2022 0331  ? PLT 149 (L) 06/06/2020 1142  ? MCV 104.7 (H) 03/18/2022 0331  ? MCV 95 06/06/2020 1142  ? MCH 35.1 (H) 03/18/2022 0331  ? MCHC 33.6 03/18/2022 0331  ? RDW 15.9 (H) 03/18/2022 0331  ? RDW 12.6 06/06/2020 1142  ? LYMPHSABS 0.9 03/24/2016 0241  ? MONOABS 1.4 (H) 03/24/2016 0241  ? EOSABS 0.4 03/24/2016 0241  ? BASOSABS 0.0 03/24/2016 0241  ? ? ?BMET ?   ?Component Value Date/Time  ? NA 140 03/18/2022 0331  ? NA 146 (H) 12/24/2020 1208  ? K 4.0 03/18/2022 0331  ? CL 108 03/18/2022 0331  ? CO2 24 03/18/2022 0331  ? GLUCOSE 83 03/18/2022 0331  ? BUN 27 (H) 03/18/2022 0331  ? BUN 15 12/24/2020 1208  ? CREATININE 1.48 (H) 03/18/2022 0331  ? CREATININE 1.20  (H) 04/13/2016 1449  ? CALCIUM 10.3 03/18/2022 0331  ? GFRNONAA 47 (L) 03/18/2022 0331  ? GFRAA 68 12/24/2020 1208  ? ? ?INR ?   ?Component Value Date/Time  ? INR 2.00 (H) 03/10/2016 1821  ? ? ? ?Intake/Output Summary (Last 24 hours) at 03/18/2022 0719 ?Last data filed at 03/18/2022 0440 ?Gross per 24 hour  ?Intake 201.24 ml  ?Output 1500 ml  ?Net -1298.76 ml  ? ? ? ?Assessment/Plan:  81 y.o. male with PAD in setting of CHF with BLE swelling Hospital Day 1 ? ?-pt states his legs feel good.  His legs are not elevated on any pillows.  I will place order for legs to be up on 1-2 pillows.   ?-he has hx of left CEA by Dr. Darrick Penna in 2017.  The right was ~ 60% at that time.  Will order carotid duplex while he is here in the hospital.  He denies any stroke sx.  ? ? ?[name], PA-C ?[facility] ?[phone] ?03/18/2022 ?7:19 AM ? ?

## 2022-03-18 NOTE — Progress Notes (Addendum)
? ?Progress Note ? ?Patient Name: [name] [name] ?Date of Encounter: 03/18/2022 ? ?[facility] Cardiologist: Shelva Majestic, MD  ? ?Subjective  ? ?Breathing has improved.  ? ?Inpatient Medications  ?  ?Scheduled Meds: ? aspirin  81 mg Oral Daily  ? atorvastatin  80 mg Oral q1800  ? carvedilol  25 mg Oral BID WC  ? clopidogrel  75 mg Oral Daily  ? enoxaparin (LOVENOX) injection  40 mg Subcutaneous Q24H  ? furosemide  40 mg Intravenous BID  ? irbesartan  150 mg Oral Daily  ? multivitamin with minerals  1 tablet Oral Daily  ? spironolactone  25 mg Oral Daily  ? ?Continuous Infusions: ? ?PRN Meds: ?acetaminophen **OR** acetaminophen, lidocaine  ? ?Vital Signs  ?  ?Vitals:  ? 03/18/22 0346 03/18/22 0350 03/18/22 0352 03/18/22 0724  ?BP:  (!) 143/108 (!) 141/106 (!) 150/95  ?Pulse:  (!) 131 75 83  ?Resp:   20 19  ?Temp:   97.6 ?F (36.4 ?C) 97.8 ?F (36.6 ?C)  ?TempSrc:   Oral Oral  ?SpO2:  96% 95% 97%  ?Weight: 65 kg     ?Height:      ? ? ?Intake/Output Summary (Last 24 hours) at 03/18/2022 1014 ?Last data filed at 03/18/2022 1012 ?Gross per 24 hour  ?Intake 441.24 ml  ?Output 1800 ml  ?Net -1358.76 ml  ? ? ?  03/18/2022  ?  3:46 AM 03/17/2022  ?  8:29 PM 03/17/2022  ?  7:59 AM  ?Last 3 Weights  ?Weight (lbs) 143 lb 6.4 oz 139 lb 5.3 oz 146 lb  ?Weight (kg) 65.046 kg 63.2 kg 66.225 kg  ?   ? ?Telemetry  ?  ?Afib, rates 70s - Personally Reviewed ? ?ECG  ?  ?No new tracing  ? ?Physical Exam  ? ?GEN: No acute distress.   ?Neck: + JVD ?Cardiac: Irreg Irreg, 3/6 systolic murmur,no rubs, or gallops.  ?Respiratory: Clear to auscultation bilaterally. ?GI: Soft, nontender, non-distended  ?MS: 2+ bilateral LE edema; No deformity. ?Neuro:  Nonfocal  ?Psych: Normal affect  ? ?Labs  ?  ?High Sensitivity Troponin:   ?Recent Labs  ?Lab 03/17/22 ?1034 03/17/22 ?1207  ?TROPONINIHS 39* 35*  ?   ?Chemistry ?Recent Labs  ?Lab 03/17/22 ?0807 03/17/22 ?1034 03/18/22 ?0331  ?NA 143  --  140  ?K 4.1  --  4.0  ?CL 113*  --  108  ?CO2 23  --  24  ?GLUCOSE 99   --  83  ?BUN 27*  --  27*  ?CREATININE 1.40*  --  1.48*  ?CALCIUM 10.1  --  10.3  ?MG  --   --  1.9  ?PROT  --  6.5  --   ?ALBUMIN  --  3.0*  --   ?AST  --  31  --   ?ALT  --  24  --   ?ALKPHOS  --  93  --   ?BILITOT  --  1.2  --   ?GFRNONAA 50*  --  47*  ?ANIONGAP 7  --  8  ?  ?Lipids No results for input(s): CHOL, TRIG, HDL, LABVLDL, LDLCALC, CHOLHDL in the last 168 hours.  ?Hematology ?Recent Labs  ?Lab 03/17/22 ?0807 03/18/22 ?0331  ?WBC 6.9 8.3  ?RBC 3.89* 4.27  ?HGB 13.6 15.0  ?HCT 42.1 44.7  ?MCV 108.2* 104.7*  ?MCH 35.0* 35.1*  ?MCHC 32.3 33.6  ?RDW 15.9* 15.9*  ?PLT 160 175  ? ?Thyroid No results for input(s): TSH, FREET4  in the last 168 hours.  ?BNP ?Recent Labs  ?Lab 03/17/22 ?1034  ?BNP 714.2*  ?  ?DDimer  ?Recent Labs  ?Lab 03/17/22 ?1034  ?DDIMER 0.75*  ?  ? ?Radiology  ?  ?DG Chest 1 View ? ?Result Date: 03/17/2022 ?CLINICAL DATA:  Status post right thoracentesis EXAM: CHEST  1 VIEW COMPARISON:  Prior chest x-ray 03/17/2022 FINDINGS: Significant interval reduction and right-sided pleural effusion. No evidence of pneumothorax. Persistent left pleural effusion. Stable cardiomegaly. Tortuous and atherosclerotic thoracic aorta. Surgical changes of prior median sternotomy for multivessel CABG. Left atrial appendage ligation clip in place. No acute osseous abnormality. IMPRESSION: No evidence of pneumothorax following right-sided pleural effusion. Significant decrease in right pleural fluid volume. Electronically Signed   By: Jacqulynn Cadet M.D.   On: 03/17/2022 15:31  ? ?DG Chest 2 View ? ?Result Date: 03/17/2022 ?CLINICAL DATA:  Shortness of breath and bilateral hand and leg swelling. EXAM: CHEST - 2 VIEW COMPARISON:  03/16/2022 FINDINGS: The heart is within normal limits in size. Stable tortuosity and calcification of the thoracic aorta. Stable surgical changes from triple bypass surgery and placement of left atrial appendage closure device. Persistent bilateral pleural effusions and overlying bibasilar  atelectasis. Mild vascular congestion and possible mild interstitial edema. The bony thorax is intact. IMPRESSION: Persistent bilateral pleural effusions and overlying bibasilar atelectasis. Probable mild interstitial edema. Electronically Signed   By: Marijo Sanes M.D.   On: 03/17/2022 08:36  ? ?DG Foot Complete Left ? ?Result Date: 03/17/2022 ?CLINICAL DATA:  foot infection EXAM: LEFT FOOT - COMPLETE 3+ VIEW COMPARISON:  None. FINDINGS: There is severe left foot soft tissue swelling. Osteopenia. There is a probable old posttraumatic deformity of the second digit proximal phalanx. There is no frank bony destruction or evidence of acute fracture. IMPRESSION: Severe left foot soft tissue swelling. No evidence of acute fracture or frank bony destruction. Electronically Signed   By: Maurine Simmering M.D.   On: 03/17/2022 11:54  ? ?VAS [place] ABI WITH/WO TBI ? ?Result Date: 03/17/2022 ? LOWER EXTREMITY DOPPLER STUDY Patient Name:  [name] [name]  Date of Exam:   03/17/2022 Medical Rec #: [id number]    Accession #:    [id number] Date of Birth: [birth year]    Patient Gender: M Patient Age:   50 years Exam Location:  [facility] Procedure:      VAS [place] ABI WITH/WO TBI Referring Phys: Regan Lemming --------------------------------------------------------------------------------  Indications: Peripheral artery disease.  Limitations: Today's exam was limited due to Significant pitting edema. Comparison Study: Pre- CABG Dopplers 03/04/2016- Right ABI=1.01, left ABI=0.53 Performing Technologist: Maudry Mayhew MHA, RVT, RDCS, RDMS  Examination Guidelines: A complete evaluation includes at minimum, Doppler waveform signals and systolic blood pressure reading at the level of bilateral brachial, anterior tibial, and posterior tibial arteries, when vessel segments are accessible. Bilateral testing is considered an integral part of a complete examination. Photoelectric Plethysmograph (PPG) waveforms and toe systolic pressure readings are  included as required and additional duplex testing as needed. Limited examinations for reoccurring indications may be performed as noted.  ABI Findings: +---------+------------------+-----+----------+------------------+ Right    Rt Pressure (mmHg)IndexWaveform  Comment            +---------+------------------+-----+----------+------------------+ Brachial 111                    monophasic                   +---------+------------------+-----+----------+------------------+ PTA  Unable to insonate +---------+------------------+-----+----------+------------------+ DP       103               0.63 monophasic                   +---------+------------------+-----+----------+------------------+ Great Toe58                0.35                              +---------+------------------+-----+----------+------------------+ +---------+------------------+-----+----------+-------+ Left     Lt Pressure (mmHg)IndexWaveform  Comment +---------+------------------+-----+----------+-------+ Brachial 164                    triphasic         +---------+------------------+-----+----------+-------+ DP       79                0.48 monophasic        +---------+------------------+-----+----------+-------+ Great Toe0                 0.00 Absent            +---------+------------------+-----+----------+-------+ +-------+-----------+-----------+------------+------------+ ABI/TBIToday's ABIToday's TBIPrevious ABIPrevious TBI +-------+-----------+-----------+------------+------------+ Right  0.63       0.5        1.01                     +-------+-----------+-----------+------------+------------+ Left   0.48       0.00                                +-------+-----------+-----------+------------+------------+ Unable to insonate bilateral posterior tibial arteries; unable to know if this is secondary to significant pitting edema, highly  prominent and pulsatile venous Doppler signals, or occlusion of arteries. Right ABIs appear decreased compared to prior study on 03/04/2016.  Summary: Right: Resting right ankle-brachial index indicates moderate right lower extremity arterial disease. The right toe-brachial index is

## 2022-03-18 NOTE — Hospital Course (Addendum)
[name] [name] is a 81 y.o. male who was admitted to the [facility] at [facility] for BLE swelling and SOB. Hospital course is outlined below by system.  ? ? ?Acute on chronic heart failure with reduce EF ?Patient presented fluid overloaded with significant right pleural effusion.  IR performed right thoracentesis which yielded 1.3 L.  Patient was diuresed with IV Lasix 40 twice daily until patient was more euvolemic.  Creatinine did increase progressively throughout hospitalization likely secondary to aggressive diuresis.  Patient's other medications that were potentially nephrotoxic were held in the setting of increased creatinine including spironolactone, ARB.  Coreg was decreased down to 3.125 BID with meals.  Cardiology was consulted as well as heart failure team this admission.  Patient was started on milrinone in order to continue diuresis. Patient had cumulative net output 12.2 L.  Patient had a LHC/RHC, which showed severe native 3 VD CAD, 4 of 4 grafts patent, mild PAH, mitral regurgitation.  Patient was recommended to continue medical therapy.  Patient also had a TEE which showed patient would qualify for a mitral clipping for his mitral regurgitation which will be further worked up outpatient. ? ?AKI on CKD 3b ?Patient's creatinine increased up to 2.08 during this admission.  This is likely in the setting of aggressive diuresis and cardiorenal syndrome.  Patient was started on milrinone in order to continue diuresis which allowed for a stable creatinine around 1.7.  Patient's baseline creatinine appears to be around 1.4. ? ?Atrial Fibrillation  ?Patient's heart rate very rarely exceeded 100 bpm throughout admission.  Patient was also counseled on use of DOAC for atrial fibrillation; however, patient refused both Eliquis and Coumadin due to bleeding risk and cost. ? ?Severe PAD  H/o CABG x4  Ischemic Cardiomyopathy  ?Hx of 60% carotid occlusion s/p left CEA ?Right Carotid artery occlusion   ?Patient initially came in due to severe lower extremity progressive worsening edema as well as some upper extremity edema.  ABI was performed which showed moderate arterial disease in right lower extremity and no discernible waveform in LLE. Edema did progressively reduce with IV diuresis.  VVS was consulted which recommended outpatient follow-up and possible outpatient arteriogram.  ? ?HTN ?Patient was normotensive or hypotensive during hospitalization so in context of patient's elevated creatinine, held his home ARB, Aldactone and reduce Coreg significantly. ? ?Other chronic conditions were medically managed with home medications and formulary alternatives as necessary (Macrocytosis, HLD,) ? ?PCP Follow-up Recommendations: ?BMP to assess creatinine ?CBC to assess Hgb ?Assess volume status ?Assess medication compliance ?

## 2022-03-18 NOTE — Progress Notes (Signed)
FPTS Night Rounding Progress Note ? ?S:Went to evaluate patient, he is asleep. I did not awake the patient.  ? ?O: ?BP (!) 146/87 (BP Location: Left Arm)   Pulse 69   Temp 98.6 ?F (37 ?C) (Oral)   Resp 18   Ht 5\' 2"  (1.575 m)   Wt 63.2 kg Comment: scale B  SpO2 97%   BMI 25.48 kg/m?   ?General: Patient resting comfortably in bed, in no acute distress. ?Resp: normal work of breathing ? ?A/P: ?Heart failure exacerbation with pleural effusion, now s/p right thoracentesis with 1.3L of clear yellow fluid. Continue lasix 40 mg bid, cardiology following and appreciate continued recommendations. Telemetry reviewed and appropriate. Consider repeat echo after drainage of pleural effusion, remainder of plan per day team.  ? , DO ?03/18/2022, 2:09 AM ?PGY-2, [facility] ?Service pager [phone]  ?

## 2022-03-18 NOTE — Evaluation (Signed)
Occupational Therapy Evaluation ?Patient Details ?Name: [name] [name] ?MRN: [id number] ?DOB: 24-Oct-1941 ?Today's Date: 03/18/2022 ? ? ?History of Present Illness 81 y.o. male with a hx of HTN, chronic systolic and diastolic heart failure, multivessel CAD s/p CABGx4, permeant atrial fibrillation, ischemic cardiomyopathy, HTN, PAD. Patient admitted with heart failure & volume overloaded. He is s/p right thoracentesis with 1.3L of clear yellow fluid  ? ?Clinical Impression ?  ?Pt admitted as above, presenting with deficits as stated below (please refer to OT problem list). Pt lives with his wife whom does the cooking and cleaning per his report. He states that he uses a RW/SPC or furniture surfs at home. He does not leave the house often per his report or do much walking. He reports independence with ADL's & functional mobility related to ADL's PTA. He is currently Min guard-Min A for functional mobility and transfers. He should benefit from acute OT to assist in maximizing independence with ADL's, self care and transfers prior to anticipated d/c home with PRN assist from his spouse. ?   ? ?Recommendations for follow up therapy are one component of a multi-disciplinary discharge planning process, led by the attending physician.  Recommendations may be updated based on patient status, additional functional criteria and insurance authorization.  ? ?Follow Up Recommendations ? No OT follow up  ?  ?Assistance Recommended at Discharge Intermittent Supervision/Assistance  ?Patient can return home with the following A little help with bathing/dressing/bathroom;Assistance with cooking/housework;A little help with walking and/or transfers ? ?  ?Functional Status Assessment ? Patient has had a recent decline in their functional status and demonstrates the ability to make significant improvements in function in a reasonable and predictable amount of time.  ?Equipment Recommendations ? Tub/shower seat  ?  ?Recommendations for Other  Services PT consult ? ? ?  ?Precautions / Restrictions Precautions ?Precautions: Fall ?Precaution Comments: Elevate bilateral LE's on 1-2 pillows ?Restrictions ?Weight Bearing Restrictions: No  ? ?  ? ?Mobility Bed Mobility ?Overal bed mobility: Needs Assistance ?Bed Mobility: Supine to Sit ?  ?  ?Supine to sit: Supervision, HOB elevated ?  ?  ?General bed mobility comments: Assist for lines, no hands on assist. ?Patient Response: Flat affect ? ?Transfers ?Overall transfer level: Needs assistance ?Equipment used: Rolling walker (2 wheels) ?Transfers: Sit to/from Stand ?Sit to Stand: Min assist, Min guard ?  ?  ?  ?General transfer comment: Pt stood with min guard-min assist for safety secondary to quickly bending over to pick up kleenex' that had fallen from his waist, w/o warning. Can be impulsive. ?  ? ?  ?Balance Overall balance assessment: Mild deficits observed, not formally tested, Needs assistance ?Sitting-balance support: No upper extremity supported, Single extremity supported, Feet supported ?Sitting balance-Leahy Scale: Good ?  ?  ?Standing balance support: Single extremity supported, During functional activity ?Standing balance-Leahy Scale: Fair ?   ? ?ADL either performed or assessed with clinical judgement  ? ?ADL Overall ADL's : Needs assistance/impaired ?Eating/Feeding: Set up;Bed level;Sitting ?  ?Grooming: Set up;Bed level ?  ?Upper Body Bathing: Set up;Sitting ?  ?Lower Body Bathing: Set up;Sit to/from stand;Minimal assistance ?  ?Upper Body Dressing : Set up;Sitting ?  ?Lower Body Dressing: Set up;Sit to/from stand;Minimal assistance ?  ?Toilet Transfer: Minimal assistance;Ambulation;Rolling walker (2 wheels);Cueing for safety ?Toilet Transfer Details (indicate cue type and reason): Simulated sit to stand from EOB, ambulated around bed to recliner and SPT to chair. Min Assist for safety, lines. ?Toileting- Water quality scientist and Hygiene: Min guard;Sit to/from stand ?  ?  ?  ?  Functional  mobility during ADLs: Min guard;Rolling walker (2 wheels);Cueing for safety ?General ADL Comments: Pt was seen for OT assessment, followed by functional mobility in room and transfers. Pt can be impulsive at times during mobility and was noted to pick up kleenex that had fallen from his waist to floor x2 while ambulating with RW in room. Overall Min guard-min A using RW for safety and lines. Pt was educated in role of OT and acute goals. He currently denies needs at this time. Pt appears to have decreased safety awareness/awareness of deficits - will continue to assess in functional context/therapy sessions. No family present. Pt lives with wife whom does cooking. Pt reports Mod I ADL's and states "I don't walk much or leave the house".  ? ? ? ?Vision Baseline Vision/History: 1 Wears glasses (PRN for reading only per pt report) ?Patient Visual Report: No change from baseline ?Vision Assessment?: No apparent visual deficits  ?   ?   ?   ? ?Pertinent Vitals/Pain Pain Assessment ?Pain Assessment: No/denies pain  ? ? ? ?Hand Dominance Right ?  ?Extremity/Trunk Assessment Upper Extremity Assessment ?Upper Extremity Assessment: Overall WFL for tasks assessed;Generalized weakness ?  ?Lower Extremity Assessment ?Lower Extremity Assessment: Defer to PT evaluation ?  ?  ?  ?Communication Communication ?Communication: No difficulties ?  ?Cognition Arousal/Alertness: Awake/alert ?Behavior During Therapy: [facility] for tasks assessed/performed, Flat affect, Impulsive ?Overall Cognitive Status: No family/caregiver present to determine baseline cognitive functioning ?  ?  ?General Comments: Pt can be impulsive with mobility at times. Quickly bending to pick things up off the floor (kleenex' that fell). No LOB. ??decreased awareness of safety/deficits ?  ?  ?General Comments  Pt LE's were elevated onto 2 pillows when seated in recliner chair as per MD orders in chart. ? ?  ?   ?   ? ? ?Home Living Family/patient expects to be discharged  to:: Private residence ?Living Arrangements: Spouse/significant other ?Available Help at Discharge: Family;Available 24 hours/day ?Type of Home: House ?Home Access: Level entry ?  ?  ?Home Layout: One level ?  ?  ?Bathroom Shower/Tub: Tub/shower unit ?  ?Bathroom Toilet: Handicapped height ?Bathroom Accessibility: Yes ?How Accessible: Accessible via walker ?Home Equipment: Conservation officer, nature (2 wheels);Cane - single point ?  ?  ? ?  ?Prior Functioning/Environment Prior Level of Function : Independent/Modified Independent ?  ?  ?Mobility Comments: Pt uses SPC or RW. Doesn't leave house often. Furniture surfs at home most often per pt report. ?ADLs Comments: Pt reports Mod I ADL's, no family present. Wife does cooking. Pt sometimes does laundry ?  ? ?  ?  ?OT Problem List: Decreased knowledge of use of DME or AE;Decreased knowledge of precautions;Decreased activity tolerance;Impaired balance (sitting and/or standing);Cardiopulmonary status limiting activity ?  ?   ?OT Treatment/Interventions: Self-care/ADL training;Patient/family education;Energy conservation;Therapeutic activities;DME and/or AE instruction  ?  ?OT Goals(Current goals can be found in the care plan section) Acute Rehab OT Goals ?Patient Stated Goal: Go home ?OT Goal Formulation: With patient ?Time For Goal Achievement: 04/01/22 ?Potential to Achieve Goals: Good  ?OT Frequency: Min 2X/week ?  ? ?   ?AM-PAC OT "6 Clicks" Daily Activity     ?Outcome Measure Help from another person eating meals?: None ?Help from another person taking care of personal grooming?: A Little ?Help from another person toileting, which includes using toliet, bedpan, or urinal?: A Little ?Help from another person bathing (including washing, rinsing, drying)?: A Little ?Help from another person to put on  and taking off regular upper body clothing?: None ?Help from another person to put on and taking off regular lower body clothing?: A Little ?6 Click Score: 20 ?  ?End of Session  Equipment Utilized During Treatment: Rolling walker (2 wheels);Other (comment) (iv/lines) ?Nurse Communication: Mobility status;Other (comment) (Pt can be impulsive) ? ?Activity Tolerance: Patient tolerated treatment

## 2022-03-18 NOTE — TOC Initial Note (Addendum)
Transition of Care (TOC) - Initial/Assessment Note  ? ? ?Patient Details  ?Name: [name] [name] ?MRN: [id number] ?Date of Birth: Apr 04, 1941 ? ?Transition of Care (TOC) CM/SW Contact:    ?Zenon Mayo, RN ?Phone Number: ?03/18/2022, 4:42 PM ? ?Clinical Narrative:                 ?From home with wife, his wife takes him to [facility] apts.  He has a walker and two canes at home.  NCM offered choice for [facility] services, he states he does not want HH.  He has PCP.  He states eliquis is too expensive for him it is around 400.00 will let MD know this information. Megan with HF will also try to asist him tomorrow with patient ast for eliquis.  He states wife will transport him home at discharge. TOC will continue to follow for dc needs.  ? ?Expected Discharge Plan: [place] ?Barriers to Discharge: Continued Medical Work up ? ? ?Patient Goals and CMS Choice ?Patient states their goals for this hospitalization and ongoing recovery are:: return home ?CMS Medicare.gov Compare Post Acute Care list provided to:: Patient ?Choice offered to / list presented to : Patient ? ?Expected Discharge Plan and Services ?Expected Discharge Plan: [place] ?  ?Discharge Planning Services: CM Consult ?Post Acute Care Choice: Home Health ?Living arrangements for the past 2 months: [place] ?                ?  ?DME Agency: NA ?  ?  ?  ?HH Arranged: Refused HH, PT ?  ?  ?  ?  ? ?Prior Living Arrangements/Services ?Living arrangements for the past 2 months: [place] ?Lives with:: Spouse ?Patient language and need for interpreter reviewed:: Yes ?Do you feel safe going back to the place where you live?: Yes      ?Need for Family Participation in Patient Care: Yes (Comment) ?Care giver support system in place?: Yes (comment) ?Current home services: DME (walker and two canes) ?Criminal Activity/Legal Involvement Pertinent to Current Situation/Hospitalization: No - Comment as needed ? ?Activities of  Daily Living ?Home Assistive Devices/Equipment: Eyeglasses, Dentures (specify type), Walker (specify type) (upper dentures only, reading glasses, front wheel walker) ?ADL Screening (condition at time of admission) ?Patient's cognitive ability adequate to safely complete daily activities?: Yes ?Is the patient deaf or have difficulty hearing?: No ?Does the patient have difficulty seeing, even when wearing glasses/contacts?: No ?Does the patient have difficulty concentrating, remembering, or making decisions?: No ?Patient able to express need for assistance with ADLs?: Yes ?Does the patient have difficulty dressing or bathing?: No ?Independently performs ADLs?: Yes (appropriate for developmental age) ?Does the patient have difficulty walking or climbing stairs?: Yes ?Weakness of Legs: Both ?Weakness of Arms/Hands: None ? ?Permission Sought/Granted ?  ?  ?   ?   ?   ?   ? ?Emotional Assessment ?Appearance:: Appears stated age ?Attitude/Demeanor/Rapport: Engaged ?Affect (typically observed): Appropriate ?Orientation: : Oriented to Situation, Oriented to  Time, Oriented to Place, Oriented to Self ?Alcohol / Substance Use: Not Applicable ?Psych Involvement: No (comment) ? ?Admission diagnosis:  Leg edema [R60.0] ?Cellulitis of left lower extremity [L03.116] ?Swelling of lower extremity [M79.89] ?Patient Active Problem List  ? Diagnosis Date Noted  ? Swelling of lower extremity 03/17/2022  ? Paroxysmal atrial fibrillation (HCC)   ? Atrial fibrillation ([place]) 03/21/2016  ? S/P CABG x 4 03/10/2016  ? CAD (coronary artery disease)   ?  PAD (peripheral artery disease) ([facility])   ? Bilateral carotid artery disease ([place])   ? Essential hypertension   ? CAD in native artery   ? Bruit   ? Hypertensive urgency 03/03/2016  ? Hypokalemia 03/03/2016  ? Bilateral pleural effusion 03/03/2016  ? Acute combined systolic and diastolic heart failure ([place])   ? Hypertensive heart disease with heart failure ([place])   ? ?PCP:  Orpah Melter,  MD ?Pharmacy:   ?[id number] - [place], [place] - [address] ?[address] ?[place] [address] ?Phone: [phone] Fax: [phone] ? ?[facility] ?[address] ?[place] [address] ?Phone: [phone] Fax: [phone] ? ? ? ? ?Social Determinants of Health (SDOH) Interventions ?  ? ?Readmission Risk Interventions ?   ? View : No data to display.  ?  ?  ?  ? ? ? ?

## 2022-03-18 NOTE — Plan of Care (Signed)

## 2022-03-18 NOTE — Evaluation (Signed)
Physical Therapy Evaluation ?Patient Details ?Name: [name] [name] ?MRN: [id number] ?DOB: Apr 30, 1941 ?Today's Date: 03/18/2022 ? ?History of Present Illness ? Pt is an 81 y.o. male who presented 03/17/22 with bil lower extremity swelling and pain. Pt with heart failure exacerbation and pleural effusions. He has evidence of multilevel arterial occlusive disease in bil legs. S/p R thoracentesis 4/4. PMH: HFmrEF, CAD, s/p CABG x4, PAD, Afib ?  ?Clinical Impression ? Pt presents with condition above and deficits mentioned below, see PT Problem List. PTA, he was mod I, intermittently not using an AD but rather holding onto furniture for household mobility and other times using a RW or SPC for mobility. He lives with his wife in a 1-level house with a ramped entrance option. He denies any recent falls, but displays balance deficits along with some mild impulsivity that place him at risk for falls. In addition, he demonstrates edema, erythema, and generalized weakness in his bil lower extremities. He was able to perform all functional mobility, including ambulating up to ~110 ft with a RW, at a min guard assist level this date. He would benefit from follow-up with HHPT to address his deficits and reduce his risk for falls. Will continue to follow acutely. ?   ? ?Recommendations for follow up therapy are one component of a multi-disciplinary discharge planning process, led by the attending physician.  Recommendations may be updated based on patient status, additional functional criteria and insurance authorization. ? ?Follow Up Recommendations Home health PT ? ?  ?Assistance Recommended at Discharge PRN  ?Patient can return home with the following ? Help with stairs or ramp for entrance;Assistance with cooking/housework;A little help with bathing/dressing/bathroom ? ?  ?Equipment Recommendations None recommended by PT  ?Recommendations for Other Services ?    ?  ?Functional Status Assessment Patient has had a recent decline in their  functional status and demonstrates the ability to make significant improvements in function in a reasonable and predictable amount of time.  ? ?  ?Precautions / Restrictions Precautions ?Precautions: Fall ?Precaution Comments: Elevate bilateral LE's on 1-2 pillows ?Restrictions ?Weight Bearing Restrictions: No  ? ?  ? ?Mobility ? Bed Mobility ?Overal bed mobility: Needs Assistance ?Bed Mobility: Supine to Sit, Sit to Supine ?  ?  ?Supine to sit: Supervision, HOB elevated ?Sit to supine: Supervision, HOB elevated ?  ?General bed mobility comments: Supervision for safety, extra time to manage legs, HOB slightly elevated ?  ? ?Transfers ?Overall transfer level: Needs assistance ?Equipment used: Rolling walker (2 wheels) ?Transfers: Sit to/from Stand ?Sit to Stand: Min guard ?  ?  ?  ?  ?  ?General transfer comment: Min guard for safety, no LOB, but mild unsteadiness noted. ?  ? ?Ambulation/Gait ?Ambulation/Gait assistance: Min guard ?Gait Distance (Feet): 110 Feet ?Assistive device: Rolling walker (2 wheels) ?Gait Pattern/deviations: Step-through pattern, Decreased stride length, Trunk flexed ?Gait velocity: reduced ?Gait velocity interpretation: <1.8 ft/sec, indicate of risk for recurrent falls ?  ?General Gait Details: Pt with slow gait and flexed posture, mild unsteadiness noted, but no LOB. Takes a few steps without UE support in room with increased trunk sway noted. ? ?Stairs ?  ?  ?  ?  ?  ? ?Wheelchair Mobility ?  ? ?Modified Rankin (Stroke Patients Only) ?  ? ?  ? ?Balance Overall balance assessment: Needs assistance ?Sitting-balance support: No upper extremity supported, Feet supported ?Sitting balance-Leahy Scale: Good ?  ?  ?Standing balance support: No upper extremity supported, Single extremity supported, Bilateral upper extremity  supported, During functional activity ?Standing balance-Leahy Scale: Fair ?Standing balance comment: Able to stand without UE support but displays trunk sway and benefits from  1-2 UE support ?  ?  ?  ?  ?  ?  ?  ?  ?  ?  ?  ?   ? ? ? ?Pertinent Vitals/Pain Pain Assessment ?Pain Assessment: Faces ?Faces Pain Scale: Hurts a little bit ?Pain Location: grimacing wiht leg movement ?Pain Descriptors / Indicators: Grimacing ?Pain Intervention(s): Limited activity within patient's tolerance, Monitored during session, Repositioned  ? ? ?Home Living Family/patient expects to be discharged to:: Private residence ?Living Arrangements: Spouse/significant other ?Available Help at Discharge: Family;Available 24 hours/day ?Type of Home: House ?Home Access: Ramped entrance (does not use stair entrance per pt) ?  ?  ?  ?Home Layout: One level ?Home Equipment: Agricultural consultant (2 wheels);Cane - single point;Shower seat ?   ?  ?Prior Function Prior Level of Function : Independent/Modified Independent;Driving ?  ?  ?  ?  ?  ?  ?Mobility Comments: Pt uses SPC or RW. Doesn't leave house often. Furniture surfs at home most often per pt report. Denies any recent falls. ?ADLs Comments: Pt reports Mod I ADL's, no family present. Wife does cooking. Pt sometimes does laundry ?  ? ? ?Hand Dominance  ?   ? ?  ?Extremity/Trunk Assessment  ? Upper Extremity Assessment ?Upper Extremity Assessment: Defer to OT evaluation ?  ? ?Lower Extremity Assessment ?Lower Extremity Assessment: Generalized weakness (edema +3 bil with erythema bil lower legs noted) ?  ? ?Cervical / Trunk Assessment ?Cervical / Trunk Assessment: Kyphotic  ?Communication  ? Communication: No difficulties;Prefers language other than English (can speak/understand English well)  ?Cognition Arousal/Alertness: Awake/alert ?Behavior During Therapy: [facility] for tasks assessed/performed, Flat affect, Impulsive ?Overall Cognitive Status: No family/caregiver present to determine baseline cognitive functioning ?  ?  ?  ?  ?  ?  ?  ?  ?  ?  ?  ?  ?  ?  ?  ?  ?General Comments: Pt can be impulsive with mobility at times and displays unsteadiness with him seeming to have  little or no awareness of this deficit. ?  ?  ? ?  ?General Comments General comments (skin integrity, edema, etc.): Bil lower extremities elevated on 1 pillow each in bed end of session per MD orders; DOE 3/4, but SpO2 and HR appeared stable on RA ? ?  ?Exercises    ? ?Assessment/Plan  ?  ?PT Assessment Patient needs continued PT services  ?PT Problem List Decreased strength;Decreased activity tolerance;Decreased balance;Decreased mobility;Decreased safety awareness;Cardiopulmonary status limiting activity ? ?   ?  ?PT Treatment Interventions DME instruction;Gait training;Stair training;Functional mobility training;Therapeutic activities;Therapeutic exercise;Balance training;Neuromuscular re-education;Patient/family education   ? ?PT Goals (Current goals can be found in the Care Plan section)  ?Acute Rehab PT Goals ?Patient Stated Goal: to get better and go home ?PT Goal Formulation: With patient ?Time For Goal Achievement: 04/01/22 ?Potential to Achieve Goals: Good ? ?  ?Frequency Min 3X/week ?  ? ? ?Co-evaluation   ?  ?  ?  ?  ? ? ?  ?AM-PAC PT "6 Clicks" Mobility  ?Outcome Measure Help needed turning from your back to your side while in a flat bed without using bedrails?: A Little ?Help needed moving from lying on your back to sitting on the side of a flat bed without using bedrails?: A Little ?Help needed moving to and from a bed to a chair (including  a wheelchair)?: A Little ?Help needed standing up from a chair using your arms (e.g., wheelchair or bedside chair)?: A Little ?Help needed to walk in hospital room?: A Little ?Help needed climbing 3-5 steps with a railing? : A Little ?6 Click Score: 18 ? ?  ?End of Session   ?Activity Tolerance: Patient tolerated treatment well ?Patient left: in bed;with call bell/phone within reach;with bed alarm set ?Nurse Communication: Mobility status ?PT Visit Diagnosis: Unsteadiness on feet (R26.81);Other abnormalities of gait and mobility (R26.89);Muscle weakness  (generalized) (M62.81);Difficulty in walking, not elsewhere classified (R26.2) ?  ? ?Time: [id number] ?PT Time Calculation (min) (ACUTE ONLY): 18 min ? ? ?Charges:   PT Evaluation ?$PT Eval Moderate Complexity: 1 Mo

## 2022-03-19 ENCOUNTER — Encounter (HOSPITAL_COMMUNITY): Payer: Self-pay | Admitting: Student

## 2022-03-19 DIAGNOSIS — I509 Heart failure, unspecified: Secondary | ICD-10-CM

## 2022-03-19 DIAGNOSIS — I340 Nonrheumatic mitral (valve) insufficiency: Secondary | ICD-10-CM | POA: Diagnosis not present

## 2022-03-19 DIAGNOSIS — M7989 Other specified soft tissue disorders: Secondary | ICD-10-CM | POA: Diagnosis not present

## 2022-03-19 DIAGNOSIS — I5041 Acute combined systolic (congestive) and diastolic (congestive) heart failure: Secondary | ICD-10-CM | POA: Diagnosis not present

## 2022-03-19 DIAGNOSIS — I4811 Longstanding persistent atrial fibrillation: Secondary | ICD-10-CM | POA: Diagnosis not present

## 2022-03-19 LAB — CBC
HCT: 37.8 % — ABNORMAL LOW (ref 39.0–52.0)
Hemoglobin: 12.6 g/dL — ABNORMAL LOW (ref 13.0–17.0)
MCH: 34.9 pg — ABNORMAL HIGH (ref 26.0–34.0)
MCHC: 33.3 g/dL (ref 30.0–36.0)
MCV: 104.7 fL — ABNORMAL HIGH (ref 80.0–100.0)
Platelets: 134 10*3/uL — ABNORMAL LOW (ref 150–400)
RBC: 3.61 MIL/uL — ABNORMAL LOW (ref 4.22–5.81)
RDW: 15.9 % — ABNORMAL HIGH (ref 11.5–15.5)
WBC: 6.3 10*3/uL (ref 4.0–10.5)
nRBC: 0 % (ref 0.0–0.2)

## 2022-03-19 LAB — BASIC METABOLIC PANEL
Anion gap: 6 (ref 5–15)
BUN: 33 mg/dL — ABNORMAL HIGH (ref 8–23)
CO2: 24 mmol/L (ref 22–32)
Calcium: 9.8 mg/dL (ref 8.9–10.3)
Chloride: 110 mmol/L (ref 98–111)
Creatinine, Ser: 1.78 mg/dL — ABNORMAL HIGH (ref 0.61–1.24)
GFR, Estimated: 38 mL/min — ABNORMAL LOW (ref >60)
Glucose, Bld: 81 mg/dL (ref 70–99)
Potassium: 3.6 mmol/L (ref 3.5–5.1)
Sodium: 140 mmol/L (ref 135–145)

## 2022-03-19 LAB — MAGNESIUM: Magnesium: 1.8 mg/dL (ref 1.7–2.4)

## 2022-03-19 MED ORDER — MAGNESIUM SULFATE 2 GM/50ML IV SOLN
2.0000 g | Freq: Once | INTRAVENOUS | Status: AC
  Administered 2022-03-19: 2 g via INTRAVENOUS
  Filled 2022-03-19: qty 50

## 2022-03-19 MED ORDER — ENOXAPARIN SODIUM 30 MG/0.3ML IJ SOSY
30.0000 mg | PREFILLED_SYRINGE | INTRAMUSCULAR | Status: DC
  Administered 2022-03-19 – 2022-03-23 (×5): 30 mg via SUBCUTANEOUS
  Filled 2022-03-19 (×5): qty 0.3

## 2022-03-19 MED ORDER — WARFARIN - PHARMACIST DOSING INPATIENT: Freq: Every day | Status: DC

## 2022-03-19 MED ORDER — WARFARIN SODIUM 5 MG PO TABS: 5.0000 mg | ORAL_TABLET | Freq: Once | ORAL | Status: DC

## 2022-03-19 MED ORDER — POTASSIUM CHLORIDE CRYS ER 20 MEQ PO TBCR
40.0000 meq | EXTENDED_RELEASE_TABLET | Freq: Two times a day (BID) | ORAL | Status: AC
  Administered 2022-03-19 (×2): 40 meq via ORAL
  Filled 2022-03-19 (×2): qty 2

## 2022-03-19 MED ORDER — CARVEDILOL 3.125 MG PO TABS
3.1250 mg | ORAL_TABLET | Freq: Two times a day (BID) | ORAL | Status: DC
  Administered 2022-03-20: 3.125 mg via ORAL
  Filled 2022-03-19: qty 1

## 2022-03-19 MED ORDER — WARFARIN SODIUM 5 MG PO TABS
5.0000 mg | ORAL_TABLET | Freq: Once | ORAL | Status: AC
  Administered 2022-03-19: 5 mg via ORAL
  Filled 2022-03-19: qty 1

## 2022-03-19 MED ORDER — FUROSEMIDE 10 MG/ML IJ SOLN
40.0000 mg | Freq: Two times a day (BID) | INTRAMUSCULAR | Status: DC
  Administered 2022-03-19 (×2): 40 mg via INTRAVENOUS
  Filled 2022-03-19 (×3): qty 4

## 2022-03-19 MED ORDER — CARVEDILOL 12.5 MG PO TABS: 12.5000 mg | ORAL_TABLET | Freq: Two times a day (BID) | ORAL | Status: DC

## 2022-03-19 NOTE — Progress Notes (Signed)
Occupational Therapy Treatment ?Patient Details ?Name: [name] [name] ?MRN: [id number] ?DOB: 1941-06-09 ?Today's Date: 03/19/2022 ? ? ?History of present illness Pt is an 81 y.o. male who presented 03/17/22 with bil lower extremity swelling and pain. Pt with heart failure exacerbation and pleural effusions. He has evidence of multilevel arterial occlusive disease in bil legs. S/p R thoracentesis 4/4. PMH: HFmrEF, CAD, s/p CABG x4, PAD, Afib ?  ?OT comments ? Patient received in bed and agreeable to OT session.  Once on EOB patient was discovered to be wet as was his bed. Patient required encouragement to change gown but was setup to supervision to perform. Patient performed mobility with RW and cues for safety.  Patient returned to bed and feet elevated. Acute OT to continue to follow.   ? ?Recommendations for follow up therapy are one component of a multi-disciplinary discharge planning process, led by the attending physician.  Recommendations may be updated based on patient status, additional functional criteria and insurance authorization. ?   ?Follow Up Recommendations ? No OT follow up  ?  ?Assistance Recommended at Discharge Intermittent Supervision/Assistance  ?Patient can return home with the following ? A little help with bathing/dressing/bathroom;Assistance with cooking/housework;A little help with walking and/or transfers ?  ?Equipment Recommendations ? Tub/shower seat  ?  ?Recommendations for Other Services   ? ?  ?Precautions / Restrictions Precautions ?Precautions: Fall ?Precaution Comments: Elevate bilateral LE's on 1-2 pillows ?Restrictions ?Weight Bearing Restrictions: No  ? ? ?  ? ?Mobility Bed Mobility ?Overal bed mobility: Needs Assistance ?Bed Mobility: Supine to Sit, Sit to Supine ?  ?  ?Supine to sit: Supervision, HOB elevated ?Sit to supine: Supervision, HOB elevated ?  ?General bed mobility comments: increased time ?  ? ?Transfers ?Overall transfer level: Needs assistance ?Equipment used: Rolling  walker (2 wheels) ?Transfers: Sit to/from Stand ?Sit to Stand: Min guard ?  ?  ?  ?  ?  ?General transfer comment: min guard for safety with no LOB ?  ?  ?Balance Overall balance assessment: Needs assistance ?Sitting-balance support: No upper extremity supported, Feet supported ?Sitting balance-Leahy Scale: Good ?  ?  ?Standing balance support: No upper extremity supported, Single extremity supported, Bilateral upper extremity supported, During functional activity ?Standing balance-Leahy Scale: Fair ?Standing balance comment: able to stand at sink for grooming with no UE support ?  ?  ?  ?  ?  ?  ?  ?  ?  ?  ?  ?   ? ?ADL either performed or assessed with clinical judgement  ? ?ADL Overall ADL's : Needs assistance/impaired ?  ?  ?Grooming: Wash/dry hands;Wash/dry face;Oral care;Min guard;Standing ?Grooming Details (indicate cue type and reason): at sink ?Upper Body Bathing: Set up;Sitting ?  ?Lower Body Bathing: Min guard;Sit to/from stand ?  ?Upper Body Dressing : Set up;Sitting ?Upper Body Dressing Details (indicate cue type and reason): changed gown ?  ?  ?  ?  ?  ?  ?  ?  ?Functional mobility during ADLs: Min guard;Rolling walker (2 wheels);Cueing for safety ?General ADL Comments: cues for safety with RW use ?  ? ?Extremity/Trunk Assessment   ?  ?  ?  ?  ?  ? ?Vision   ?  ?  ?Perception   ?  ?Praxis   ?  ? ?Cognition Arousal/Alertness: Awake/alert ?Behavior During Therapy: [facility] for tasks assessed/performed, Flat affect, Impulsive ?Overall Cognitive Status: No family/caregiver present to determine baseline cognitive functioning ?  ?  ?  ?  ?  ?  ?  ?  ?  ?  ?  ?  ?  ?  ?  ?  ?  General Comments: required cues for encouragement to change wet gown ?  ?  ?   ?Exercises   ? ?  ?Shoulder Instructions   ? ? ?  ?General Comments    ? ? ?Pertinent Vitals/ Pain       Pain Assessment ?Pain Assessment: Faces ?Faces Pain Scale: Hurts a little bit ?Pain Location: grimacing wiht leg movement ?Pain Descriptors / Indicators:  Grimacing ?Pain Intervention(s): Limited activity within patient's tolerance, Monitored during session, Repositioned ? ?Home Living   ?  ?  ?  ?  ?  ?  ?  ?  ?  ?  ?  ?  ?  ?  ?  ?  ?  ?  ? ?  ?Prior Functioning/Environment    ?  ?  ?  ?   ? ?Frequency ? Min 2X/week  ? ? ? ? ?  ?Progress Toward Goals ? ?OT Goals(current goals can now be found in the care plan section) ? Progress towards OT goals: Progressing toward goals ? ?Acute Rehab OT Goals ?Patient Stated Goal: get better ?OT Goal Formulation: With patient ?Time For Goal Achievement: 04/01/22 ?Potential to Achieve Goals: Good ?ADL Goals ?Pt Will Perform Lower Body Bathing: with modified independence;with set-up;with adaptive equipment;sitting/lateral leans;sit to/from stand ?Pt Will Perform Lower Body Dressing: with modified independence;with set-up;with adaptive equipment;sitting/lateral leans;sit to/from stand ?Pt Will Transfer to Toilet: with modified independence;ambulating ?Pt Will Perform Toileting - Clothing Manipulation and hygiene: with modified independence;sitting/lateral leans;sit to/from stand ?Pt Will Perform Tub/Shower Transfer: with supervision;Tub transfer;ambulating;rolling walker;shower seat ?Additional ADL Goal #1: Pt will independently state 1-2 energy conservation techniques and implement during ADL session (issue/review handout).  ?Plan Discharge plan remains appropriate   ? ?Co-evaluation ? ? ?   ?  ?  ?  ?  ? ?  ?AM-PAC OT "6 Clicks" Daily Activity     ?Outcome Measure ? ? Help from another person eating meals?: None ?Help from another person taking care of personal grooming?: A Little ?Help from another person toileting, which includes using toliet, bedpan, or urinal?: A Little ?Help from another person bathing (including washing, rinsing, drying)?: A Little ?Help from another person to put on and taking off regular upper body clothing?: None ?Help from another person to put on and taking off regular lower body clothing?: A Little ?6  Click Score: 20 ? ?  ?End of Session Equipment Utilized During Treatment: Rolling walker (2 wheels) ? ?OT Visit Diagnosis: Muscle weakness (generalized) (M62.81);Unsteadiness on feet (R26.81) ?  ?Activity Tolerance Patient tolerated treatment well ?  ?Patient Left in bed;with call bell/phone within reach;with bed alarm set ?  ?Nurse Communication Mobility status ?  ? ?   ? ?Time: [phone] ?OT Time Calculation (min): 35 min ? ?Charges: OT General Charges ?$OT Visit: 1 Visit ?OT Treatments ?$Self Care/Home Management : 23-37 mins ? ?Alfonse Flavors, OTA ?Acute Rehabilitation Services  ?Pager [phone] ?Office [phone] ? ? ?Dewain Penning ?03/19/2022, 9:17 AM ?

## 2022-03-19 NOTE — Progress Notes (Addendum)
FMTS Attending Daily Note: ?Dorris Singh, MD  ?Team Pager [phone] ?Pager [phone]  ?I have seen and examined this patient, reviewed their chart. I have discussed this patient with the resident physician. ? ?I agree with the remainder of the findings, exam, and plan below.  ? ?Disposition: Home, patient declining PT/OT at home, lives with wife, likely 2-3 more days in hospital  ? ? ?Family Medicine Teaching Service ?Daily Progress Note ?Intern Pager: [phone] ? ?Patient name: [name] [name] Medical record number: [id number] ?Date of birth: [birth year] Age: 81 y.o. Gender: male ? ?Primary Care Provider: Orpah Melter, MD ?Consultants: Cardiology, VVS, IR ?Code Status: Full ? ?Pt Overview and Major Events to Date:  ?4/4 admitted ? ?Assessment and Plan: ?[name] [name] is a 81 y.o. male presenting with acute exacerbation of HFrEF, slowly diuresing PMH is significant for HFmrEF, CAD on ASA and Plavis s/p CABG x4 (LIMA to LAD, SVG to OM, SVG to PDA, and SVG to diagonal), PAD, Afib.  ? ?Acute on chronic heart failure with reduce EF, currently exacerbation.  ?Minimal UOP yesterday and no changes in weight; however, of note, he had noted unmeasured incontinence. Creatinine 1.48>1.78.  ?-Cardiology following, appreciate recommendations ?-Strict I's/O's ?-Daily weights ?-IV Lasix 40 bid ?- Holding ARB and Spironolactone  ?-Repleting K and Mg given continued diuresis ?- Bladder scans to monitor for retention  ? ?Severe PAD  H/o CABG x4  Ischemic Cardiomyopathy  ?Hx of 60% carotid occlusion s/p left CEA ?VVS assessed patient and recommended for outpatient follow-up to monitor PAD and possible arteriogram ?- Will  need yearly carotid Duplex ?-Continue DAPT with Plavix and ASA ?-Continue elevation with 1-2 pillows ? ?Increased creatinine in context of CKD Stage 3b ?Creatinine bumped to 1.78 in setting of aggressive diuresis and antihypertensives. ?-Holding aldactone, ARB and decreasing coreg in order to continue diuresis ?-A.m.  BMP ?-Bladder scan q8 hours to assess for retention ? ?Atrial Fibrillation  ?Currently refusing anticoagulation due to cost as well as bleeding risk.  Currently not in RVR. ?-Cardiology consulted, appreciate recommendations ?-Continuous cardiac monitoring ? ?Urinary Frequency ?Denies having frequency yesterday. ?-We will continue to monitor ?-Consider PSA and starting tamsulosin once more euvolemic ? ?HLD: chronic, stable ?-Lipitor 80 mg qd ?  ?HTN: chronic, stable ?BP 90s/50s and bradycardia in 50s ?Home med: coreg 25 mg bid w/ meals, irbesartan 150 mg daily, Aldactone 25 mg daily. ?-Holding antihypertensives (ARB, aldactone, coreg) today ?-Decrease Coreg to 3.125 bid w/ meals starting tomorrow ?  ?Macrocytosis  ?-AM CBC ?-Multivitamin ? ?FEN/GI: regular ?PPx: lovenox ?Dispo:Home in 2-3 days. Barriers include ongoing diuresis.  ? ?Subjective:  ?Patient seen and assessed at bedside.  Patient denies any acute concerns at this time.  Patient reports swelling is still very much present in lower extremities.  Patient denies any increase in swelling anywhere.  Patient was short of breath after our conversation with increased work of breathing.  All questions addressed. ? ?Objective: ?Temp:  [97.6 ?F (36.4 ?C)-98.5 ?F (36.9 ?C)] 98.3 ?F (36.8 ?C) (04/06 0434) ?Pulse Rate:  [55-83] 56 (04/06 0434) ?Resp:  [15-20] 20 (04/06 0434) ?BP: (96-150)/(52-95) 96/52 (04/06 0434) ?SpO2:  [91 %-98 %] 97 % (04/06 0434) ?Weight:  [66 kg] 66 kg (04/06 0434) ?Physical Exam: ?General: Resting comfortably at bedside on room air ?Cardiovascular: Irregular rhythm normal rate ?Respiratory: CTA B ?Abdomen: Soft nontender ?Extremities: 2+ pitting edema in bilateral lower extremities with mild redness but appears to be improving ? ?Laboratory: ?Recent Labs  ?Lab 03/17/22 ?0807 03/18/22 ?0331 03/19/22 ?0506  ?  WBC 6.9 8.3 6.3  ?HGB 13.6 15.0 12.6*  ?HCT 42.1 44.7 37.8*  ?PLT 160 175 134*  ? ?Recent Labs  ?Lab 03/17/22 ?0807 03/17/22 ?1034  03/18/22 ?0331 03/19/22 ?0506  ?NA 143  --  140 140  ?K 4.1  --  4.0 3.6  ?CL 113*  --  108 110  ?CO2 23  --  24 24  ?BUN 27*  --  27* 33*  ?CREATININE 1.40*  --  1.48* 1.78*  ?CALCIUM 10.1  --  10.3 9.8  ?PROT  --  6.5  --   --   ?BILITOT  --  1.2  --   --   ?ALKPHOS  --  93  --   --   ?ALT  --  24  --   --   ?AST  --  31  --   --   ?GLUCOSE 99  --  83 81  ? ? ? ? ?Imaging/Diagnostic Tests: ?VAS US CAROTID ? ?Result Date: 03/18/2022 ?Carotid Arterial Duplex Study Patient Name:  OR RABELO  Date of Exam:   03/18/2022 Medical Rec #: [id number]    Accession #:    [id number] Date of Birth: 1941-10-04    Patient Gender: M Patient Age:   69 years Exam Location:  [facility] Procedure:      VAS US CAROTID Referring Phys: Aldona Bar RHYNE --------------------------------------------------------------------------------  Indications:       Carotid stenosis. Risk Factors:      Hypertension, hyperlipidemia, Diabetes, past history of                    smoking, PAD. Other Factors:     Left endarterectomy on 03/10/2016. Comparison Study:  11/12/2016 - Right ICA 60% stenosis, left CEA no evidence of                    restenosis. Performing Technologist: Archie Patten RVS  Examination Guidelines: A complete evaluation includes B-mode imaging, spectral Doppler, color Doppler, and power Doppler as needed of all accessible portions of each vessel. Bilateral testing is considered an integral part of a complete examination. Limited examinations for reoccurring indications may be performed as noted.  Right Carotid Findings: +----------+--------+--------+--------+----------------------+----------------+           PSV cm/sEDV cm/sStenosisPlaque Description    Comments         +----------+--------+--------+--------+----------------------+----------------+ CCA Prox  101                     heterogenous                           +----------+--------+--------+--------+----------------------+----------------+ CCA Distal94                       calcific and irregular                 +----------+--------+--------+--------+----------------------+----------------+ ICA Prox                  Occludedcalcific and irregularappears occluded +----------+--------+--------+--------+----------------------+----------------+ ICA Mid                   Occluded                      appears occluded +----------+--------+--------+--------+----------------------+----------------+ ICA Distal                Occluded  appears occluded +----------+--------+--------+--------+----------------------+----------------+ ECA       228                                                            +----------+--------+--------+--------+----------------------+----------------+ +----------+--------+-------+--------+-------------------+           PSV cm/sEDV cmsDescribeArm Pressure (mmHG) +----------+--------+-------+--------+-------------------+ [id number]            Stenotic                    +----------+--------+-------+--------+-------------------+ +---------+--------+--------+--------+ VertebralPSV cm/sEDV cm/soccluded +---------+--------+--------+--------+  Left Carotid Findings: +---------+--------+-------+--------+---------------------------------+--------+          PSV cm/sEDV    StenosisPlaque Description               Comments                  cm/s                                                     +---------+--------+-------+--------+---------------------------------+--------+ CCA Prox 162     17             heterogenous                              +---------+--------+-------+--------+---------------------------------+--------+ CCA      104     10             heterogenous                              Distal                                                                    +---------+--------+-------+--------+---------------------------------+--------+  ICA Prox 239     42     40-59%  heterogenous, irregular and                                               calcific                                  +---------+--------+-------+--------+---------------------------------+--------+ ICA Mid  142     40                                                       +---------+-----

## 2022-03-19 NOTE — Progress Notes (Addendum)
?Progress Note ? ?VASCULAR SURGERY ASSESSMENT & PLAN:  ? ?PERIPHERAL ARTERIAL DISEASE: He has evidence of multilevel arterial occlusive disease.  He has a small wound in his right leg but this should improve as his leg swelling resolves.  I have arranged follow-up as an outpatient so we can follow the wound on his right leg.  If this does not resolve we can discuss potential arteriography.  The situation is further complicated by his chronic kidney disease.  His arteriogram would have to be done with CO2 and limited contrast.  ? ? HISTORY OF BILATERAL CAROTID DISEASE: He had a left carotid endarterectomy by Dr. Oneida Alar in 2017.  I have independently interpreted his carotid duplex scan from yesterday.  He has a recurrent 40 to 59% stenosis on the left.  His right ICA is occluded.  Given that the right ICA is occluded I think we need to follow-up of recurrent stenosis on the left closely and I have ordered a follow-up carotid duplex scan in 6 months. ? ?Gae Gallop, MD ?8:24 AM ? ? ?03/19/2022 ?7:27 AM ?Hospital Day 2 ? ?Subjective:  no complaints ? ?afebrile ? ?Vitals:  ? 03/19/22 0400 03/19/22 0434  ?BP:  (!) 96/52  ?Pulse:  (!) 56  ?Resp:  20  ?Temp:  98.3 ?F (36.8 ?C)  ?SpO2: 96% 97%  ? ? ?Physical Exam: ?General  no distress ?Lungs:  non labored ?Extremities:  BLE swelling ? ?CBC ?   ?Component Value Date/Time  ? WBC 6.3 03/19/2022 0506  ? RBC 3.61 (L) 03/19/2022 0506  ? HGB 12.6 (L) 03/19/2022 0506  ? HGB 15.1 06/06/2020 1142  ? HCT 37.8 (L) 03/19/2022 0506  ? HCT 44.6 06/06/2020 1142  ? PLT 134 (L) 03/19/2022 0506  ? PLT 149 (L) 06/06/2020 1142  ? MCV 104.7 (H) 03/19/2022 0506  ? MCV 95 06/06/2020 1142  ? MCH 34.9 (H) 03/19/2022 0506  ? MCHC 33.3 03/19/2022 0506  ? RDW 15.9 (H) 03/19/2022 0506  ? RDW 12.6 06/06/2020 1142  ? LYMPHSABS 0.9 03/24/2016 0241  ? MONOABS 1.4 (H) 03/24/2016 0241  ? EOSABS 0.4 03/24/2016 0241  ? BASOSABS 0.0 03/24/2016 0241  ? ? ?BMET ?   ?Component Value Date/Time  ? NA 140 03/19/2022  0506  ? NA 146 (H) 12/24/2020 1208  ? K 3.6 03/19/2022 0506  ? CL 110 03/19/2022 0506  ? CO2 24 03/19/2022 0506  ? GLUCOSE 81 03/19/2022 0506  ? BUN 33 (H) 03/19/2022 0506  ? BUN 15 12/24/2020 1208  ? CREATININE 1.78 (H) 03/19/2022 0506  ? CREATININE 1.20 (H) 04/13/2016 1449  ? CALCIUM 9.8 03/19/2022 0506  ? GFRNONAA 38 (L) 03/19/2022 0506  ? GFRAA 68 12/24/2020 1208  ? ? ?INR ?   ?Component Value Date/Time  ? INR 2.00 (H) 03/10/2016 1821  ? ? ? ?Intake/Output Summary (Last 24 hours) at 03/19/2022 0727 ?Last data filed at 03/19/2022 0438 ?Gross per 24 hour  ?Intake 720 ml  ?Output 675 ml  ?Net 45 ml  ? ? ? ?Assessment/Plan:  81 y.o. male with PAD Hospital Day 2 ? ?PAD ?-pt with evidence of multilevel arterial occlusive disease.  He has a small wound in his right leg but this should improve as his leg swelling resolves.  His creatinine is up to 1.78 today.  Will need arteriogram at some point, either inpatient or outpatient pending pt's progress ? ?Carotid disease ?-right ICA is occluded and no intervention ?-left ICA s/p left CEA is in the low end  of 40-59% - will continue to monitor this yearly.   ? ?Leontine Locket, PA-C ?[facility] ?[id number] ?03/19/2022 ?7:27 AM ? ?

## 2022-03-19 NOTE — Progress Notes (Signed)
Heart Failure Stewardship Pharmacist Progress Note ? ? ?PCP: Orpah Melter, MD ?PCP-Cardiologist: Shelva Majestic, MD  ? ? ?HPI:  ?81 yo M with PMH of HTN, CHF, CAD s/p CABG, afib, and PAD. He presented to the ED on 4/4 with edema in legs and hands. CXR with persistent bilateral pleural effusions and probably mild interstitial edema. An ECHO was done on 4/4 and LVEF was 30-35% (was 45-50% in June 2021) with mild LVH and moderately reduced RV. ? ?Current HF Medications: ?Diuretic: furosemide 40 mg IV BID ?Beta Blocker: carvedilol 3.125 mg BID ? ?Prior to admission HF Medications: ?Diuretic: furosemide 20 mg daily ?Beta blocker: carvedilol 25 mg BID ?ACE/ARB/ARNI: valsartan 160 mg daily ?Aldosterone Antagonist: spironolactone 25 mg daily ? ?Pertinent Lab Values: ?Serum creatinine 1.78, BUN 33, Potassium 3.6, Sodium 140, BNP 714.2, Magnesium 1.8 ? ?Vital Signs: ?Weight: 145 lbs (admission weight: 139 lbs) ?Blood pressure: 90/50s ?Heart rate: 70s  ?I/O: -1.6L yesterday; net -1L ? ?Medication Assistance / Insurance Benefits Check: ?Does the patient have prescription insurance?  Yes ?Type of insurance plan: [employer] ? ?Outpatient Pharmacy:  ?Prior to admission outpatient pharmacy: [facility] ?Is the patient willing to use [facility] at discharge? Yes ?Is the patient willing to transition their outpatient pharmacy to utilize a [facility] outpatient pharmacy?   Pending ?  ? ?Assessment: ?1. Acute on chronic systolic and diastolic CHF (EF 99991111), due to ICM. NYHA class III symptoms. ?- Continue furosemide 40 mg IV BID - consider increasing to 80 mg if output remains low or weight continues to increase  ?- Agree with decreasing to carvedilol 3.125 mg BID ?- ARB stopped with low BP - unable to afford Entresto in the past, educated about pt assistance, and is unwilling to try again. ?- Off spironolactone 25 mg daily with low BP ?- Cannot afford SGLT2i and unwilling to start pt assistance process ?  ?Plan: ?1) Medication  changes recommended at this time: ?- Consider further increasing IV lasix tomorrow if output does not pick up and weight continues to increase ? ?2) Patient assistance: ?- Unwilling to attempt pt assistance for costly meds ?Delene Loll copay $45 ?- Jardiance copay $45 ?- Farxiga copay $95 ? ?3)  Education  ?- Patient has been educated on current HF medications and potential additions to HF medication regimen ?- Patient verbalizes understanding that over the next few months, these medication doses may change and more medications may be added to optimize HF regimen ?- Patient has been educated on basic disease state pathophysiology and goals of therapy ? ? ?Kerby Nora, PharmD, BCPS ?Heart Failure Stewardship Pharmacist ?Phone [phone] ? ? ?

## 2022-03-19 NOTE — Progress Notes (Addendum)
? ?Progress Note ? ?Patient Name: [name] [name] ?Date of Encounter: 03/19/2022 ? ?[facility] Cardiologist: Nicki Guadalajarahomas Kelly, MD  ? ?Subjective  ? ?Sitting up in bed. No complaints, was able to walk in the hallway yesterday afternoon.  ? ?Inpatient Medications  ?  ?Scheduled Meds: ? aspirin  81 mg Oral Daily  ? atorvastatin  80 mg Oral q1800  ? [START ON 03/20/2022] carvedilol  3.125 mg Oral BID WC  ? clopidogrel  75 mg Oral Daily  ? enoxaparin (LOVENOX) injection  30 mg Subcutaneous Q24H  ? multivitamin with minerals  1 tablet Oral Daily  ? ?Continuous Infusions: ? ?PRN Meds: ?acetaminophen **OR** acetaminophen, lidocaine  ? ?Vital Signs  ?  ?Vitals:  ? 03/19/22 0300 03/19/22 0330 03/19/22 0400 03/19/22 0434  ?BP:    (!) 96/52  ?Pulse:    (!) 56  ?Resp:    20  ?Temp:    98.3 ?F (36.8 ?C)  ?TempSrc:    Oral  ?SpO2: 96% 92% 96% 97%  ?Weight:    66 kg  ?Height:      ? ? ?Intake/Output Summary (Last 24 hours) at 03/19/2022 0934 ?Last data filed at 03/19/2022 [id number] ?Gross per 24 hour  ?Intake 720 ml  ?Output 675 ml  ?Net 45 ml  ? ? ?  03/19/2022  ?  4:34 AM 03/18/2022  ?  3:46 AM 03/17/2022  ?  8:29 PM  ?Last 3 Weights  ?Weight (lbs) 145 lb 9.6 oz 143 lb 6.4 oz 139 lb 5.3 oz  ?Weight (kg) 66.044 kg 65.046 kg 63.2 kg  ?   ? ?Telemetry  ?  ?Afib SVR in the 50s, several pauses noted mostly < 3 second, but one noted 4.3 at 1833 (4/5)  - Personally Reviewed ? ?ECG  ?  ?No new tracing ? ?Physical Exam  ? ?GEN: No acute distress.   ?Neck: + JVD ?Cardiac: Irreg Irreg, 3/6 systolic murmur LLSB, no rubs, or gallops.  ?Respiratory: Clear to auscultation bilaterally. ?GI: Soft, nontender, non-distended  ?MS: 2+ pitting LE edema bilaterally; No deformity. ?Neuro:  Nonfocal  ?Psych: Normal affect  ? ?Labs  ?  ?High Sensitivity Troponin:   ?Recent Labs  ?Lab 03/17/22 ?1034 03/17/22 ?1207  ?TROPONINIHS 39* 35*  ?   ?Chemistry ?Recent Labs  ?Lab 03/17/22 ?0807 03/17/22 ?1034 03/18/22 ?0331 03/19/22 ?0506  ?NA 143  --  140 140  ?K 4.1  --  4.0 3.6  ?CL  113*  --  108 110  ?CO2 23  --  24 24  ?GLUCOSE 99  --  83 81  ?BUN 27*  --  27* 33*  ?CREATININE 1.40*  --  1.48* 1.78*  ?CALCIUM 10.1  --  10.3 9.8  ?MG  --   --  1.9 1.8  ?PROT  --  6.5  --   --   ?ALBUMIN  --  3.0*  --   --   ?AST  --  31  --   --   ?ALT  --  24  --   --   ?ALKPHOS  --  93  --   --   ?BILITOT  --  1.2  --   --   ?GFRNONAA 50*  --  47* 38*  ?ANIONGAP 7  --  8 6  ?  ?Lipids No results for input(s): CHOL, TRIG, HDL, LABVLDL, LDLCALC, CHOLHDL in the last 168 hours.  ?Hematology ?Recent Labs  ?Lab 03/17/22 ?0807 03/18/22 ?0331 03/19/22 ?0506  ?WBC 6.9 8.3 6.3  ?  RBC 3.89* 4.27 3.61*  ?HGB 13.6 15.0 12.6*  ?HCT 42.1 44.7 37.8*  ?MCV 108.2* 104.7* 104.7*  ?MCH 35.0* 35.1* 34.9*  ?MCHC 32.3 33.6 33.3  ?RDW 15.9* 15.9* 15.9*  ?PLT 160 175 134*  ? ?Thyroid No results for input(s): TSH, FREET4 in the last 168 hours.  ?BNP ?Recent Labs  ?Lab 03/17/22 ?1034  ?BNP 714.2*  ?  ?DDimer  ?Recent Labs  ?Lab 03/17/22 ?1034  ?DDIMER 0.75*  ?  ? ?Radiology  ?  ?DG Chest 1 View ? ?Result Date: 03/17/2022 ?CLINICAL DATA:  Status post right thoracentesis EXAM: CHEST  1 VIEW COMPARISON:  Prior chest x-ray 03/17/2022 FINDINGS: Significant interval reduction and right-sided pleural effusion. No evidence of pneumothorax. Persistent left pleural effusion. Stable cardiomegaly. Tortuous and atherosclerotic thoracic aorta. Surgical changes of prior median sternotomy for multivessel CABG. Left atrial appendage ligation clip in place. No acute osseous abnormality. IMPRESSION: No evidence of pneumothorax following right-sided pleural effusion. Significant decrease in right pleural fluid volume. Electronically Signed   By: Malachy Moan M.D.   On: 03/17/2022 15:31  ? ?DG Foot Complete Left ? ?Result Date: 03/17/2022 ?CLINICAL DATA:  foot infection EXAM: LEFT FOOT - COMPLETE 3+ VIEW COMPARISON:  None. FINDINGS: There is severe left foot soft tissue swelling. Osteopenia. There is a probable old posttraumatic deformity of the second  digit proximal phalanx. There is no frank bony destruction or evidence of acute fracture. IMPRESSION: Severe left foot soft tissue swelling. No evidence of acute fracture or frank bony destruction. Electronically Signed   By: Caprice Renshaw M.D.   On: 03/17/2022 11:54  ? ?VAS [place] ABI WITH/WO TBI ? ?Result Date: 03/17/2022 ? LOWER EXTREMITY DOPPLER STUDY Patient Name:  [name] [name]  Date of Exam:   03/17/2022 Medical Rec #: [id number]    Accession #:    [id number] Date of Birth: 1941/04/06    Patient Gender: M Patient Age:   24 years Exam Location:  [facility] Procedure:      VAS [place] ABI WITH/WO TBI Referring Phys: Ernie Avena --------------------------------------------------------------------------------  Indications: Peripheral artery disease.  Limitations: Today's exam was limited due to Significant pitting edema. Comparison Study: Pre- CABG Dopplers 03/04/2016- Right ABI=1.01, left ABI=0.53 Performing Technologist: Gertie Fey MHA, RVT, RDCS, RDMS  Examination Guidelines: A complete evaluation includes at minimum, Doppler waveform signals and systolic blood pressure reading at the level of bilateral brachial, anterior tibial, and posterior tibial arteries, when vessel segments are accessible. Bilateral testing is considered an integral part of a complete examination. Photoelectric Plethysmograph (PPG) waveforms and toe systolic pressure readings are included as required and additional duplex testing as needed. Limited examinations for reoccurring indications may be performed as noted.  ABI Findings: +---------+------------------+-----+----------+------------------+ Right    Rt Pressure (mmHg)IndexWaveform  Comment            +---------+------------------+-----+----------+------------------+ Brachial 111                    monophasic                   +---------+------------------+-----+----------+------------------+ PTA                                       Unable to insonate  +---------+------------------+-----+----------+------------------+ DP       103               0.63 monophasic                   +---------+------------------+-----+----------+------------------+  Great Toe58                0.35                              +---------+------------------+-----+----------+------------------+ +---------+------------------+-----+----------+-------+ Left     Lt Pressure (mmHg)IndexWaveform  Comment +---------+------------------+-----+----------+-------+ Brachial 164                    triphasic         +---------+------------------+-----+----------+-------+ DP       79                0.48 monophasic        +---------+------------------+-----+----------+-------+ Great Toe0                 0.00 Absent            +---------+------------------+-----+----------+-------+ +-------+-----------+-----------+------------+------------+ ABI/TBIToday's ABIToday's TBIPrevious ABIPrevious TBI +-------+-----------+-----------+------------+------------+ Right  0.63       0.5        1.01                     +-------+-----------+-----------+------------+------------+ Left   0.48       0.00                                +-------+-----------+-----------+------------+------------+ Unable to insonate bilateral posterior tibial arteries; unable to know if this is secondary to significant pitting edema, highly prominent and pulsatile venous Doppler signals, or occlusion of arteries. Right ABIs appear decreased compared to prior study on 03/04/2016.  Summary: Right: Resting right ankle-brachial index indicates moderate right lower extremity arterial disease. The right toe-brachial index is abnormal. Left: Resting left ankle-brachial index indicates severe left lower extremity arterial disease. No discernable left great toe PPG waveform.  *See table(s) above for measurements and observations.  Electronically signed by Waverly Ferrari MD on 03/17/2022 at 1:17:35  PM.    Final   ? ?ECHOCARDIOGRAM COMPLETE ? ?Result Date: 03/17/2022 ?   ECHOCARDIOGRAM REPORT   Patient Name:   [name] [name] Date of Exam: 03/17/2022 Medical Rec #:  [id number]   Height:       62.0 in Accession #:    [id number]  Weight:       146.0 lb Date of Birth:  Aug 30, 1941   BSA:

## 2022-03-19 NOTE — Progress Notes (Addendum)
Q8 hour bladder scans ordered today due to uncalculated voids by patient. Day shift RN reported bladder scan volumes of 3 mL and 12 mL earlier today. Tonight's bladder scan = 38 mL. ?

## 2022-03-19 NOTE — Progress Notes (Addendum)
ANTICOAGULATION CONSULT NOTE - Initial Consult ? ?Pharmacy Consult for Warfarin ?Indication: atrial fibrillation ? ?No Known Allergies ? ?Patient Measurements: ?Height: 5\' 2"  (157.5 cm) ?Weight: 66 kg (145 lb 9.6 oz) ?IBW/kg (Calculated) : 54.6 ? ?Vital Signs: ?Temp: 97.8 ?F (36.6 ?C) (04/06 1147) ?Temp Source: Oral (04/06 1147) ?BP: 116/69 (04/06 1147) ?Pulse Rate: 55 (04/06 1147) ? ?Labs: ?Recent Labs  ?  03/17/22 ?0807 03/17/22 ?1034 03/17/22 ?1207 03/18/22 ?0331 03/19/22 ?0506  ?HGB 13.6  --   --  15.0 12.6*  ?HCT 42.1  --   --  44.7 37.8*  ?PLT 160  --   --  175 134*  ?CREATININE 1.40*  --   --  1.48* 1.78*  ?TROPONINIHS  --  39* 35*  --   --   ? ? ?Estimated Creatinine Clearance: 27.3 mL/min (A) (by C-G formula based on SCr of 1.78 mg/dL (H)). ? ? ?Medical History: ?Past Medical History:  ?Diagnosis Date  ? Atrial fibrillation ([place]) 03/21/2016  ? ? ?Medications:  ?Scheduled:  ? aspirin  81 mg Oral Daily  ? atorvastatin  80 mg Oral q1800  ? [START ON 03/20/2022] carvedilol  3.125 mg Oral BID WC  ? clopidogrel  75 mg Oral Daily  ? enoxaparin (LOVENOX) injection  30 mg Subcutaneous Q24H  ? furosemide  40 mg Intravenous BID  ? multivitamin with minerals  1 tablet Oral Daily  ? potassium chloride  40 mEq Oral BID  ? ? ?Assessment: ?Patient is a 81 yo M presenting with bilateral lower extremity pain and edema secondary to HFrEF exacerbation. Pharmacy was consulted to dose warfarin for Afib management.  ? ?Patient had previous trial of apixaban and was intolerant due to epistaxis. Patient is unable to afford Eliquis and agreed to switch to warfarin.  ? ?Hgb 12.6 and plt 134, no s/sx of bleeding was noted.  ? ?Goal of Therapy:  ?INR 2-3 ?Monitor platelets by anticoagulation protocol: Yes ?  ?Plan:  ?Warfarin 5mg  today x 1  ?Order CBC and INR for further dosing ?Monitor for s/s of bleeding ? ?Faustino Congress ?03/19/2022,3:18 PM ? ? ?

## 2022-03-19 NOTE — Progress Notes (Signed)
FPTS Interim Progress Note ? ?S:Went to bedside to check on patient, I did not wake the patient.  ? ?O: ?BP (!) 106/58 (BP Location: Left Arm)   Pulse (!) 57   Temp 98.5 ?F (36.9 ?C) (Oral)   Resp 20   Ht 5\' 2"  (1.575 m)   Wt 65 kg Comment: scale B  SpO2 95%   BMI 26.23 kg/m?   ?General: Patient resting comfortably, in no acute distress.  ?Resp: normal work of  breathing noted  ? ?A/P: ?Vitals, telemetry and orders reviewed. Continue lasix 40 mg bid, day team to reassess and redose lasix appropriately tomorrow. Remainder of plan per day team.  ? , DO ?03/19/2022, 2:05 AM ?PGY-2,  Family Medicine ?Service pager [phone]  ?

## 2022-03-19 NOTE — Progress Notes (Signed)
Heart Failure Nurse Navigator Progress Note ? ?PCP: Joycelyn Rua, MD ?PCP-Cardiologist: Tresa Endo ?Admission Diagnosis:  ?Admitted from: Home ? ?Presentation:   ?[name] [name] presented with decompensated heart failure, worsening edema to hands and feet for the last couple months. Chest xray showed bilateral pleural effusions. Being diuresed.  ?Spoke in depth with patient about the importance of taking his medications as prescribed, watching his fluid and diet restrictions, the importance of daily weights, which he stated he has not been doing so. Went over the Heart failure book and educated about the color zone for when to call his doctor or seek emergent care. He voiced his understanding. He stated he will be at his HF [facility] appointment on 04/01/22 @ 10 am and bring his wife so she can hear the information too.  ? ?ECHO/ LVEF: 30-35% ? ?Clinical Course: ? ?Past Medical History:  ?Diagnosis Date  ? Atrial fibrillation ([facility]) 03/21/2016  ?  ? ?Social History  ? ?Socioeconomic History  ? Marital status: Married  ?  Spouse name: Fionn Stracke  ? Number of children: 2  ? Years of education: Not on file  ? Highest education level: High school graduate  ?Occupational History  ? 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 Smoking status: Former  ? Smokeless tobacco: Former  ? Tobacco comments:  ?  Quit 1994  ?Vaping Use  ? Vaping Use: Never used  ?Substance and Sexual Activity  ? Alcohol use: Never  ? Drug use: Not Currently  ? Sexual activity: Not on file  ?Other Topics Concern  ? Not on file  ?Social History Narrative  ? Not on file  ? ?Social Determinants of Health  ? ?Financial Resource Strain: Low Risk   ? Difficulty of Paying Living Expenses: Not hard at all  ?Food Insecurity: No Food Insecurity  ? Worried About Programme researcher, broadcasting/film/video in the Last Year: Never true  ? Ran Out of Food in the Last Year: Never true  ?Transportation Needs: No Transportation Needs  ? Lack of Transportation (Medical): No  ? Lack of  Transportation (Non-Medical): No  ?Physical Activity: Not on file  ?Stress: Not on file  ?Social Connections: Not on file  ? ?Education Assessment and Provision: ? ?Detailed education and instructions provided on heart failure disease management including the following: ? ?Signs and symptoms of Heart Failure ?When to call the physician ?Importance of daily weights ?Low sodium diet ?Fluid restriction ?Medication management ?Anticipated future follow-up appointments ? ?Patient education given on each of the above topics.  Patient acknowledges understanding via teach back method and acceptance of all instructions. ? ?Education Materials:  "Living Better With Heart Failure" Booklet, HF zone tool, & Daily Weight Tracker Tool. ? ?Patient has scale at home: yes ?Patient has pill box at home: NA ?   ?High Risk Criteria for Readmission and/or Poor Patient Outcomes: ?Heart failure hospital admissions (last 6 months): 1  ?No Show rate: 5% ?Difficult social situation: No ?Demonstrates medication adherence: Yes ?Primary Language: [place], speaks [place] ?Literacy level: Reading, writing and comprehension ? ?Barriers of Care:   ?Daily weights ?Activity levels ? ?Considerations/Referrals:  ? ?Referral made to Heart Failure Pharmacist Stewardship: Yes ?Referral made to Heart Failure CSW/NCM TOC: NA ?Referral made to Heart & Vascular TOC clinic: Yes, 04/01/22 @ 10am. ? ?Items for Follow-up on DC/TOC: ?Optimize meds ?Hospital Follow up ?Education daily weights ? ? ?Rhae Hammock, BSN, RN ?Heart Failure Nurse Navigator ?Secure Chat Only  ?

## 2022-03-20 DIAGNOSIS — N289 Disorder of kidney and ureter, unspecified: Secondary | ICD-10-CM

## 2022-03-20 DIAGNOSIS — I4811 Longstanding persistent atrial fibrillation: Secondary | ICD-10-CM | POA: Diagnosis not present

## 2022-03-20 DIAGNOSIS — I272 Pulmonary hypertension, unspecified: Secondary | ICD-10-CM

## 2022-03-20 DIAGNOSIS — I5082 Biventricular heart failure: Secondary | ICD-10-CM | POA: Diagnosis not present

## 2022-03-20 DIAGNOSIS — I509 Heart failure, unspecified: Secondary | ICD-10-CM | POA: Diagnosis not present

## 2022-03-20 DIAGNOSIS — I255 Ischemic cardiomyopathy: Secondary | ICD-10-CM | POA: Diagnosis not present

## 2022-03-20 DIAGNOSIS — I5041 Acute combined systolic (congestive) and diastolic (congestive) heart failure: Secondary | ICD-10-CM | POA: Diagnosis not present

## 2022-03-20 DIAGNOSIS — M7989 Other specified soft tissue disorders: Secondary | ICD-10-CM | POA: Diagnosis not present

## 2022-03-20 LAB — BASIC METABOLIC PANEL
Anion gap: 4 — ABNORMAL LOW (ref 5–15)
BUN: 40 mg/dL — ABNORMAL HIGH (ref 8–23)
CO2: 29 mmol/L (ref 22–32)
Calcium: 10.2 mg/dL (ref 8.9–10.3)
Chloride: 105 mmol/L (ref 98–111)
Creatinine, Ser: 2.08 mg/dL — ABNORMAL HIGH (ref 0.61–1.24)
GFR, Estimated: 31 mL/min — ABNORMAL LOW (ref >60)
Glucose, Bld: 97 mg/dL (ref 70–99)
Potassium: 4.7 mmol/L (ref 3.5–5.1)
Sodium: 138 mmol/L (ref 135–145)

## 2022-03-20 LAB — PROTIME-INR
INR: 1.1 (ref 0.8–1.2)
Prothrombin Time: 14.4 seconds (ref 11.4–15.2)

## 2022-03-20 LAB — MAGNESIUM: Magnesium: 2.2 mg/dL (ref 1.7–2.4)

## 2022-03-20 MED ORDER — POTASSIUM CHLORIDE CRYS ER 20 MEQ PO TBCR
40.0000 meq | EXTENDED_RELEASE_TABLET | Freq: Two times a day (BID) | ORAL | Status: DC
  Administered 2022-03-20 – 2022-03-24 (×9): 40 meq via ORAL
  Filled 2022-03-20 (×9): qty 2

## 2022-03-20 MED ORDER — FUROSEMIDE 10 MG/ML IJ SOLN
80.0000 mg | Freq: Two times a day (BID) | INTRAMUSCULAR | Status: DC
  Administered 2022-03-20 – 2022-03-23 (×6): 80 mg via INTRAVENOUS
  Filled 2022-03-20 (×6): qty 8

## 2022-03-20 MED ORDER — FUROSEMIDE 10 MG/ML IJ SOLN
80.0000 mg | INTRAMUSCULAR | Status: AC
  Administered 2022-03-20: 80 mg via INTRAVENOUS
  Filled 2022-03-20: qty 8

## 2022-03-20 MED ORDER — MILRINONE LACTATE IN DEXTROSE 20-5 MG/100ML-% IV SOLN
0.1250 ug/kg/min | INTRAVENOUS | Status: DC
  Administered 2022-03-20 – 2022-03-23 (×2): 0.125 ug/kg/min via INTRAVENOUS
  Filled 2022-03-20 (×3): qty 100

## 2022-03-20 NOTE — Consult Note (Signed)
?HEART AND VASCULAR CENTER   ?MULTIDISCIPLINARY HEART VALVE TEAM ? ?Inpatient [facility] Consultation:  ? ?Patient ID: [name] [name]; [id number]; July 18, 1941  ? ?Admit date: 03/17/2022 ?Date of Consult: 03/20/2022 ? ?Primary Care Provider: Orpah Melter, MD ?Primary Cardiologist: Dr. Claiborne Billings, MD ? ?Patient Profile:  ? ?[name] [name] is a 81 y.o. male with a hx of CAD s/p CABG 2017 with LIMA to LAD, SVG to OM, SVG to PDA, and SVG to diagonal,  HTN, HLD, persistent atrial fibrillation (not on AC due to patient preference), CKD Stage IIIb, newly diagnosed peripheral arterial disease with multilevel arterial occlusive disease, and severe MR/TR who presented to [facility] with acute heart failure and is being seen today for the evaluation of severe mitral regurgitation at the request of Ellen Henri, [place]. ? ?History of Present Illness:  ? ?[name] [name] is an 81yo M from [place]. He lives  ? ? ?He presented to [facility] 03/17/22 with a 2 month hx of bilateral upper and lower extremity edema, progressive SOB, orthopnea, and weight gain. He was admitted to teaching service for acute HF with cardiology, vascular, and AHF consultations.  ? ?[name] [name] is followed by Dr. Claiborne Billings for his cardiology care. He initially presented with accelerated HTN and increased SOB 02/2016. Echocardiogram at that time showed an LVEF at 35%. R/LHC performed 03/04/2016 which showed severe multivessel CAD with totally occluded mid RCA with distal left to right collateral. He eventually underwent CABG x4 with LIMA to LAD, SVG to OM, SVG to PDA, SVG to diagonal by Dr. Prescott Gum. He also underwent clipping of the left atrial appendage and a left carotid enterectomy. He developed postop atrial fibrillation and pleural effusion which required thoracentesis in the outpatient setting. He later underwent OP DCCV 04/2016. Due to significant nosebleeds, his Eliquis was discontinued. Echocardiogram from 09/2017 showed further depression of LV function with LVEF of 25 to 30% with diffuse  hypokinesis and akinesis of the inferolateral wall, G3DD, moderate MR, severe LAE, moderate RA dilatation, moderate aortic insufficiency, and moderate mitral regurgitation.  ? ?In follow up with Dr. Claiborne Billings, there was discussion regarding the progression of his MR and potential to further evaluate with TEE. He essentially deferred this and plan was to follow with serial echo imaging. He was trialed on several GDMT therapies over the years. Repeat echo from 06/07/20 showed some improvement in LV function to 45-50% with severe bi-atrial enlargement, mild to moderate TR, and moderate to severe mitral regurgitation. He had stopped his Entresto due to cost and was placed back on valsartan, carvedilol, spironolactone, and lasix.  ? ?On most recent follow up 11/2020 with Dr. Claiborne Billings he had mild LE edema. His spironolactone was titrated with recommendations to add compression stockings. He has since been lost to follow up with cardiology.  ? ?On this admission, creatinine at 1.4, BNP at 714, HsT 39>>35. D-dimer at 0.75. CXR with persistent bilateral pleural effusions and overlying bibasilar atelectasis, probable mild interstitial edema. VAS/ABI showed presence of pulses with severe arterial disease without DVT. He has been treated with IV Lasix 40mg  BID. He underwent successful right thoracentesis with clear 1.3L pleural fluid drainage. Repeat echocardiogram this admission with LVEF at 30-35%, LVH, D shaped interventricular septum consistent with right ventricular volume overload. There was large pleural effusion in the left lateral region, and severe mitral regurgitation with mitral annular dilation and posterior leaflet tethering. Effective regurgitant orifice area is 0.35 cm sq, regurgitant volume is 9ml, regurgitant fraction is 61%. Carotid dopplers showed RICA occlusion, left ICA with  40-49% occlusion, occluded right vertebral, high grade stenosis with mophasic flow of the subclavian arteries. VVS was consulted who  recommended treatment of his heart failure and if stabilized, would consider arteriography. He was started on ASA, Lipitor. Primary team and cardiology have continued to diurese him well. Titration og GDMT has been difficult due to hypotension and rising creatinine. AHF and structural heart teams were consulted.  ? ? ?Past Medical History:  ?Diagnosis Date  ? Atrial fibrillation ([place]) 03/21/2016  ? ? ?Past Surgical History:  ?Procedure Laterality Date  ? CARDIAC CATHETERIZATION N/A 03/04/2016  ? Procedure: Right/Left Heart Cath and Coronary Angiography;  Surgeon: Troy Sine, MD;  Location: [place] CV LAB;  Service: Cardiovascular;  Laterality: N/A;  ? CARDIOVERSION N/A 04/16/2016  ? Procedure: CARDIOVERSION;  Surgeon: Jerline Pain, MD;  Location: [facility] ENDOSCOPY;  Service: Cardiovascular;  Laterality: N/A;  ? CLIPPING OF ATRIAL APPENDAGE Left 03/10/2016  ? Procedure: CLIPPING OF ATRIAL APPENDAGE;  Surgeon: Ivin Poot, MD;  Location: [place];  Service: Open Heart Surgery;  Laterality: Left;  ? CORONARY ARTERY BYPASS GRAFT N/A 03/10/2016  ? Procedure: CORONARY ARTERY BYPASS GRAFTING (CABG)TIMES 4 USING LEFT INTERNAL MAMMARY AND BILATERAL GREATER SAPHENOUS VEIN HARVESTED BY ENDOVIEN;  Surgeon: Ivin Poot, MD;  Location: [place];  Service: Open Heart Surgery;  Laterality: N/A;  ? ENDARTERECTOMY Left 03/10/2016  ? Procedure: LEFT ENDARTERECTOMY CAROTID DACRON PATCH ANGIOPLASTY;  Surgeon: Elam Dutch, MD;  Location: [place];  Service: Vascular;  Laterality: Left;  ? IR THORACENTESIS ASP PLEURAL SPACE W/IMG GUIDE  03/17/2022  ? TEE WITHOUT CARDIOVERSION N/A 03/10/2016  ? Procedure: TRANSESOPHAGEAL ECHOCARDIOGRAM (TEE);  Surgeon: Ivin Poot, MD;  Location: [place];  Service: Open Heart Surgery;  Laterality: N/A;  ?  ? ?Inpatient Medications: ?Scheduled Meds: ? aspirin  81 mg Oral Daily  ? atorvastatin  80 mg Oral q1800  ? carvedilol  3.125 mg Oral BID WC  ? clopidogrel  75 mg Oral Daily  ? enoxaparin (LOVENOX)  injection  30 mg Subcutaneous Q24H  ? furosemide  80 mg Intravenous BID  ? multivitamin with minerals  1 tablet Oral Daily  ? ?Continuous Infusions: ? milrinone 0.125 mcg/kg/min (03/20/22 1339)  ? ?PRN Meds: ?acetaminophen **OR** acetaminophen, lidocaine ? ?Allergies:   No Known Allergies ? ?Social History:   ?Social History  ? ?Socioeconomic History  ? Marital status: Married  ?  Spouse name: Aashish Eckhart  ? Number of children: 2  ? Years of education: Not on file  ? Highest education level: High school graduate  ?Occupational History  ? 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 Smoking status: Former  ? Smokeless tobacco: Former  ? Tobacco comments:  ?  Quit 1994  ?Vaping Use  ? Vaping Use: Never used  ?Substance and Sexual Activity  ? Alcohol use: Never  ? Drug use: Not Currently  ? Sexual activity: Not on file  ?Other Topics Concern  ? Not on file  ?Social History Narrative  ? Not on file  ? ?Social Determinants of Health  ? ?Financial Resource Strain: Low Risk   ? Difficulty of Paying Living Expenses: Not hard at all  ?Food Insecurity: No Food Insecurity  ? Worried About Charity fundraiser in the Last Year: Never true  ? Ran Out of Food in the Last Year: Never true  ?Transportation Needs: No Transportation Needs  ? Lack of Transportation (Medical): No  ? Lack of Transportation (  Non-Medical): No  ?Physical Activity: Not on file  ?Stress: Not on file  ?Social Connections: Not on file  ?Intimate Partner Violence: Not on file  ?  ?Family History:   ?The patient's family history is not on file. ? ?ROS:  ?Please see the history of present illness.  ?ROS  ?All other ROS reviewed and negative.    ? ?Physical Exam/Data:  ? ?Vitals:  ? 03/20/22 0454 03/20/22 1141 03/20/22 1200 03/20/22 1343  ?BP: 130/61 136/73  (!) 88/67  ?Pulse: 60 60 60   ?Resp: 18 18    ?Temp: 97.9 ?F (36.6 ?C) 97.8 ?F (36.6 ?C)    ?TempSrc: Oral Oral    ?SpO2: 96% 99% 99%   ?Weight: 65.3 kg     ?Height:      ? ? ?Intake/Output  Summary (Last 24 hours) at 03/20/2022 1442 ?Last data filed at 03/20/2022 1339 ?Gross per 24 hour  ?Intake 1413 ml  ?Output 1675 ml  ?Net -262 ml  ? ?Filed Weights  ? 03/18/22 0346 03/19/22 0434 03/20/22 0454  ?Weight: 65 k

## 2022-03-20 NOTE — Progress Notes (Signed)
Heart Failure Navigator Progress Note ? ?Assessed for Heart & Vascular TOC clinic readiness.  ?Patient does not meet criteria due to Advance Heart Failure team will be following..  ? ?Cancelled HF TOC appt. For 04/01/22. ? ?Rhae Hammock, BSN, RN ?Heart Failure Nurse Navigator ?Secure Chat only  ?

## 2022-03-20 NOTE — Progress Notes (Signed)
ANTICOAGULATION CONSULT NOTE - Follow-Up Consult ? ?Pharmacy Consult for Warfarin ?Indication: atrial fibrillation ? ?No Known Allergies ? ?Patient Measurements: ?Height: 5\' 2"  (157.5 cm) ?Weight: 65.3 kg (143 lb 14.4 oz) ?IBW/kg (Calculated) : 54.6 ? ?Vital Signs: ?Temp: 97.9 ?F (36.6 ?C) (04/07 0454) ?Temp Source: Oral (04/07 0454) ?BP: 130/61 (04/07 0454) ?Pulse Rate: 60 (04/07 0454) ? ?Labs: ?Recent Labs  ?  03/17/22 ?1034 03/17/22 ?1207 03/18/22 ?0331 03/19/22 ?0506 03/20/22 ?0300  ?HGB  --   --  15.0 12.6*  --   ?HCT  --   --  44.7 37.8*  --   ?PLT  --   --  175 134*  --   ?LABPROT  --   --   --   --  14.4  ?INR  --   --   --   --  1.1  ?CREATININE  --   --  1.48* 1.78* 2.08*  ?TROPONINIHS 39* 35*  --   --   --   ? ? ? ?Estimated Creatinine Clearance: 21.5 mL/min (A) (by C-G formula based on SCr of 2.08 mg/dL (H)). ? ? ?Medical History: ?Past Medical History:  ?Diagnosis Date  ? Atrial fibrillation ([place]) 03/21/2016  ? ? ?Medications:  ?Scheduled:  ? aspirin  81 mg Oral Daily  ? atorvastatin  80 mg Oral q1800  ? carvedilol  3.125 mg Oral BID WC  ? clopidogrel  75 mg Oral Daily  ? enoxaparin (LOVENOX) injection  30 mg Subcutaneous Q24H  ? furosemide  40 mg Intravenous BID  ? multivitamin with minerals  1 tablet Oral Daily  ? Warfarin - Pharmacist Dosing Inpatient   Does not apply [id number]  ? ? ?Assessment: ?Patient is a 81 yo M presenting with bilateral lower extremity pain and edema secondary to HFrEF exacerbation, with a PMH of Afib. Patient had previous trial of apixaban for Afib and was intolerant due to epistaxis. Patient is unable to afford DOACs and agreed to switch to warfarin this admission. Pharmacy was consulted to dose warfarin for Afib management.  ? ?Received first dose on 4/6. INR 1.1. No documented s/sx of bleeding was noted.  ? ?Goal of Therapy:  ?INR 2-3 ?Monitor platelets by anticoagulation protocol: Yes ?  ?Plan:  ?Warfarin 7.5mg  today x 1  ?Daily CBC and INR ?Monitor for s/s of bleeding ? ?Faustino Congress ?03/20/2022,8:41 AM ? ? ?

## 2022-03-20 NOTE — Consult Note (Addendum)
?  ?Advanced Heart Failure Team Consult Note ? ? ?Primary Physician: Orpah Melter, MD ?PCP-Cardiologist:  Shelva Majestic, MD ? ?Reason for Consultation: acute on chronic biventricular heart failure  ? ?HPI:   ? ?[name] [name] is seen today for evaluation of acute on chronic biventricular heart failure, at the request of Dr. Gwenlyn Found, Cardiology.  ? ?81 y/o male w/ h/o chronic systolic heart failure due to ischemic CM, CAD s/p CABG x 4, permanent atrial fibrillation, mitral regurgitation, HTN, HLD, PAD and Stage III CKD.  ? ?CABG took place in 2017 by Dr. Prescott Gum w/ LIMA to LAD, SVG to OM, SVG to PDA, and SVG to diagonal + LAA clip and left CEA for severe carotid artery stenosis.  ? ?He has had chronic Afib since at least 2017. Had cardioversion in 2017 but w/ early recurrence. Now rate controlled on ? blocker. No longer on a/c given h/o frequent nose bleeds. Has refused retrial.  ? ?Echo from 09/17/2017 showed depression of LV function with an EF of 25-30% with diffuse hypocontractility and akinesis of the inferolateral wall.  He had grade 3 diastolic dysfunction.  There was moderate mitral regurgitation, severe LA dilation, moderate RA dilation, and moderate aortic insufficiency.  PA pressure was significant increased at 85 mmHg ? ?His last echo, prior to this admit, was 6/21 which showed recovery of EF 45-50% with inferior to inferolateral hypokinesis.  Severe biatrial enlargement.  Moderate to severe mitral regurgitation, mild to moderate tricuspid regurgitation and mild aortic insufficiency.  ? ?He had been followed by Dr. Claiborne Billings, but unfortunately lost to f/u during the [place] pandemic but apparently getting refills for lasix, coreg and ARB through PCP.  ? ?He presented to the ED on 4/4 w/ complaints of gradually worsening LEE over the last 2-3 months + progressive dyspnea. Denies CP. In ED was found to be markedly fluid overloaded and in a/c CHF. EKG showed Afib 98 bpm, incomplete right bundle branch block, left  posterior fascicular block.  ? ?BNP 714. HS trop 39>>35. WBC 6.9, Hgb 13.6, SCr 1.40, K 4.1, Mg 1.9, D-dimer 0.75. CXR showed b/l pleural effusions and mild interstitial edema. He was admitted and started on IV Lasix and underwent rt sided thoracentesis by IR w/ 1.3L clear yellow fluid removal. He has had progressive worsening of renal function w/ attempts at diuresis, SCr 1.48>>1.78>>2.08.  ? ?Echo shows biventricular failure w/ severe MR/TR. LVEF 30-35%, mild LVH, D-shaped LV c/w RV volume overload, RV mildly enlarged w/ moderately reduced systolic function w/ severely elevated RVSP 66.0 mmHg. LA moderately moderately dilated, RA severely dilated, Mod AI, severe TR.  ? ? ?Mitral Valve: There is mitral annular dilation and posterior leaflet  ?tethering. Effective regurgitant orifice area is 0.35 cm sq, regurgitant  ?volume is 67 ml, regurgitant fraction is 61%. The mitral valve is abnormal. There is mild thickening of the  ?mitral valve leaflet(s). Severe mitral valve regurgitation, with  ?centrally-directed jet.  ? ?Also of note, he has LE PAD w/ evidence of multilevel arterial occlusive disease. ABI was performed which showed moderate arterial disease in right lower extremity and no discernible waveform in LLE. + small rt leg wound. Has been seen by Dr. Scot Dock this admit. Plan to f/u in his clinic.  ? ?He lives at home w/ his wife. Fairly independent but limited some due to recent progression to NYHA Class IIIb. Still drives.  ? ? ?Echo 03/17/22 ?Left ventricular ejection fraction, by estimation, is 30 to 35%. Left ventricular ejection ?fraction by  3D volume is 26 %. The left ventricle has moderately decreased function. ?The left ventricle demonstrates global hypokinesis. There is mild concentric left ?ventricular hypertrophy. Left ventricular diastolic function could not be evaluated. ?There is the interventricular septum is flattened in diastole ('D' shaped left ventricle), ?consistent with right ventricular  volume overload. ?1. ?Right ventricular systolic function is moderately reduced. The right ventricular size is ?mildly enlarged. Mildly increased right ventricular wall thickness. There is severely ?elevated pulmonary artery systolic pressure. ?2. ?3. Left atrial size was moderately dilated. ?4. Right atrial size was severely dilated. ?5. Large pleural effusion in the left lateral region. ?There is mitral annular dilation and posterior leaflet tethering. Effective regurgitant ?orifice area is 0.35 cm sq, regurgitant volume is 67 ml, regurgitant fraction is 61%. The ?mitral valve is abnormal. Severe mitral valve regurgitation. ?6. ?7. Tricuspid valve regurgitation is severe. ?The aortic valve is tricuspid. There is mild thickening of the aortic valve. Aortic valve ?regurgitation is moderate. Aortic valve sclerosis/calcification is present, without any ?evidence of aortic stenosis. ? ? ?Review of Systems: [y] = yes, [ ]  = no  ? ?General: Weight gain [ ] ; Weight loss [ ] ; Anorexia [ ] ; Fatigue [ ] ; Fever [ ] ; Chills [ ] ; Weakness [ ]   ?Cardiac: Chest pain/pressure [ ] ; Resting SOB [ Y]; Exertional SOB [Y ]; Orthopnea [ Y]; Pedal Edema [Y ]; Palpitations [ ] ; Syncope [ ] ; Presyncope [ ] ; Paroxysmal nocturnal dyspnea[ ]   ?Pulmonary: Cough [ ] ; Wheezing[ ] ; Hemoptysis[ ] ; Sputum [ ] ; Snoring [ ]   ?GI: Vomiting[ ] ; Dysphagia[ ] ; Melena[ ] ; Hematochezia [ ] ; Heartburn[ ] ; Abdominal pain [ ] ; Constipation [ ] ; Diarrhea [ ] ; BRBPR [ ]   ?GU: Hematuria[ ] ; Dysuria [ ] ; Nocturia[ ]   ?Vascular: Pain in legs with walking [ ] ; Pain in feet with lying flat [ ] ; Non-healing sores [ ] ; Stroke [ ] ; TIA [ ] ; Slurred speech [ ] ;  ?Neuro: Headaches[ ] ; Vertigo[ ] ; Seizures[ ] ; Paresthesias[ ] ;Blurred vision [ ] ; Diplopia [ ] ; Vision changes [ ]   ?Ortho/Skin: Arthritis [ ] ; Joint pain [ ] ; Muscle pain [ ] ; Joint swelling [ ] ; Back Pain [ ] ; Rash [ ]   ?Psych: Depression[ ] ; Anxiety[ ]   ?Heme: Bleeding problems [ ] ; Clotting disorders [ ] ;  Anemia [ ]   ?Endocrine: Diabetes [ ] ; Thyroid dysfunction[ ]  ? ?Home Medications ?Prior to Admission medications   ?Medication Sig Start Date End Date Taking? Authorizing Provider  ?Ascorbic Acid (VITAMIN C) 1000 MG tablet Take 1,000 mg by mouth daily.    [provider]  ?aspirin 81 MG tablet Take 81 mg by mouth every Monday, Wednesday, and Friday.    [provider]  ?atorvastatin (LIPITOR) 80 MG tablet TAKE 1 TABLET (80 MG TOTAL) BY MOUTH DAILY AT 6 PM. 07/01/21   Troy Sine, MD  ?carvedilol (COREG) 25 MG tablet Take 1 tablet (25 mg total) by mouth in the morning and at bedtime. 11/25/20   Troy Sine, MD  ?clopidogrel (PLAVIX) 75 MG tablet TAKE 1 TABLET BY MOUTH EVERY DAY 11/06/20   Troy Sine, MD  ?furosemide (LASIX) 20 MG tablet Take 1 tablet (20 mg total) by mouth daily. 07/23/20   Troy Sine, MD  ?magnesium 30 MG tablet Take 30 mg by mouth daily.    [provider]  ?pantoprazole (PROTONIX) 40 MG tablet Take 1 tablet (40 mg total) by mouth daily. PLEASE MAKE APPOINTMENT FOR FURTHER REFILLS 01/28/17   Troy Sine, MD  ?  spironolactone (ALDACTONE) 25 MG tablet TAKE 0.5 TABLETS (12.5 MG TOTAL) BY MOUTH 2 (TWO) TIMES DAILY. TAKE 0.5 TABLETS BY MOUTH DAILY. 11/26/20   Troy Sine, MD  ?valsartan (DIOVAN) 160 MG tablet TAKE 1/2 TABLET DAILY FOR ONE WEEK, THEN INCREASE TO 1 WHOLE TABLET DAILY. 05/17/20   Troy Sine, MD  ? ? ?Past Medical History: ?Past Medical History:  ?Diagnosis Date  ? Atrial fibrillation ([place]) 03/21/2016  ? ? ?Past Surgical History: ?Past Surgical History:  ?Procedure Laterality Date  ? CARDIAC CATHETERIZATION N/A 03/04/2016  ? Procedure: Right/Left Heart Cath and Coronary Angiography;  Surgeon: Troy Sine, MD;  Location: [facility];  Service: Cardiovascular;  Laterality: N/A;  ? CARDIOVERSION N/A 04/16/2016  ? Procedure: CARDIOVERSION;  Surgeon: Jerline Pain, MD;  Location: [facility];  Service: Cardiovascular;  Laterality: N/A;  ?  CLIPPING OF ATRIAL APPENDAGE Left 03/10/2016  ? Procedure: CLIPPING OF ATRIAL APPENDAGE;  Surgeon: Ivin Poot, MD;  Location: [facility];  Service: Open Heart Surgery;  Laterality: Left;  ? CORONARY ARTERY

## 2022-03-20 NOTE — Progress Notes (Signed)
Physical Therapy Treatment ?Patient Details ?Name: [name] [name] ?MRN: [id number] ?DOB: 12-22-1940 ?Today's Date: 03/20/2022 ? ? ?History of Present Illness Pt is an 81 y.o. male who presented 03/17/22 with bil lower extremity swelling and pain. Pt with heart failure exacerbation and pleural effusions. He has evidence of multilevel arterial occlusive disease in bil legs. S/p R thoracentesis 4/4. PMH: HFmrEF, CAD, s/p CABG x4, PAD, Afib ? ?  ?PT Comments  ? ? The pt was agreeable to session with continued focus on OOB mobility, dynamic stability, and LE strength. He completed hallway ambulation with less UE support (IV pole only), and with minG for safety. He then chose to focus on LE exercises as described below. Completed x5 sit-stand transfers without assist or need for UE support. Will continue to benefit from skilled PT acutely and after d/c home to maximize functional safety and independence.  ? ?Gait Speed: 0.45m/s. (Gait speed <0.45m/s indicates increased risk of falls and dependence in ADLs) ?  ?Recommendations for follow up therapy are one component of a multi-disciplinary discharge planning process, led by the attending physician.  Recommendations may be updated based on patient status, additional functional criteria and insurance authorization. ? ?Follow Up Recommendations ? Home health PT ?  ?  ?Assistance Recommended at Discharge PRN  ?Patient can return home with the following Help with stairs or ramp for entrance;Assistance with cooking/housework;A little help with bathing/dressing/bathroom ?  ?Equipment Recommendations ? None recommended by PT  ?  ?Recommendations for Other Services   ? ? ?  ?Precautions / Restrictions Precautions ?Precautions: Fall ?Precaution Comments: elevate BLE ?Restrictions ?Weight Bearing Restrictions: No  ?  ? ?Mobility ? Bed Mobility ?Overal bed mobility: Needs Assistance ?  ?  ?  ?  ?  ?  ?General bed mobility comments: OOB in recliner at start and end of session ?   ? ?Transfers ?Overall transfer level: Needs assistance ?Equipment used: None ?Transfers: Sit to/from Stand ?Sit to Stand: Min guard ?  ?  ?  ?  ?  ?General transfer comment: minG for safety, pt reaching for single UE support initially, completed x5 without UE support at end of session ?  ? ?Ambulation/Gait ?Ambulation/Gait assistance: Min guard ?Gait Distance (Feet): 75 Feet ?Assistive device: IV Pole ?Gait Pattern/deviations: Step-through pattern, Decreased stride length, Trunk flexed ?Gait velocity: 0.5 m/s ?Gait velocity interpretation: 1.31 - 2.62 ft/sec, indicative of limited community ambulator ?  ?General Gait Details: slow but steady gait, stable with single UE support on IV pole. ? ? ? ?  ?Balance Overall balance assessment: Needs assistance ?Sitting-balance support: No upper extremity supported, Feet supported ?Sitting balance-Leahy Scale: Good ?  ?  ?Standing balance support: Single extremity supported, During functional activity ?Standing balance-Leahy Scale: Fair ?Standing balance comment: gait with single UE support on IV pole ?  ?  ?  ?  ?  ?  ?  ?  ?  ?  ?  ?  ? ?  ?Cognition Arousal/Alertness: Awake/alert ?Behavior During Therapy: [facility] for tasks assessed/performed, Flat affect, Impulsive ?Overall Cognitive Status: No family/caregiver present to determine baseline cognitive functioning ?  ?  ?  ?  ?  ?  ?  ?  ?  ?  ?  ?  ?  ?  ?  ?  ?General Comments: pt needing cues to slow at times, but generally able to follow all instructions. slightly decreased awareness to deficits ?  ?  ? ?  ?Exercises General Exercises - Lower Extremity ?Long Arc Quad: AROM,  Both, 10 reps, Seated (2 x 10) ?Hip Flexion/Marching: AROM, Both, 10 reps, Seated (2 x 10) ?Heel Raises: AROM, Both, 15 reps, Seated (2 x 10) ?Other Exercises ?Other Exercises: 5x sit-stand from recliner without UE support ? ?  ?General Comments General comments (skin integrity, edema, etc.): VSS on RA. BP soft but map 74 and stable throughout ?  ?   ? ?Pertinent Vitals/Pain Pain Assessment ?Pain Assessment: No/denies pain ?Pain Intervention(s): Monitored during session  ? ? ? ?PT Goals (current goals can now be found in the care plan section) Acute Rehab PT Goals ?PT Goal Formulation: With patient ?Time For Goal Achievement: 04/01/22 ?Potential to Achieve Goals: Good ?Progress towards PT goals: Progressing toward goals ? ?  ?Frequency ? ? ? Min 3X/week ? ? ? ?  ?PT Plan Current plan remains appropriate  ? ? ?   ?AM-PAC PT "6 Clicks" Mobility   ?Outcome Measure ? Help needed turning from your back to your side while in a flat bed without using bedrails?: A Little ?Help needed moving from lying on your back to sitting on the side of a flat bed without using bedrails?: A Little ?Help needed moving to and from a bed to a chair (including a wheelchair)?: A Little ?Help needed standing up from a chair using your arms (e.g., wheelchair or bedside chair)?: A Little ?Help needed to walk in hospital room?: A Little ?Help needed climbing 3-5 steps with a railing? : A Little ?6 Click Score: 18 ? ?  ?End of Session Equipment Utilized During Treatment: Gait belt ?Activity Tolerance: Patient tolerated treatment well ?Patient left: in chair;with call bell/phone within reach ?Nurse Communication: Mobility status ?PT Visit Diagnosis: Unsteadiness on feet (R26.81);Other abnormalities of gait and mobility (R26.89);Muscle weakness (generalized) (M62.81);Difficulty in walking, not elsewhere classified (R26.2) ?  ? ? ?Time: [id number] ?PT Time Calculation (min) (ACUTE ONLY): 20 min ? ?Charges:  $Therapeutic Exercise: 8-22 mins          ?          ? ?West Carbo, PT, DPT  ? ?Acute Rehabilitation Department ?Pager #: [phone] - [phone] ? ? ?[name] [name] ?03/20/2022, 2:41 PM ? ?

## 2022-03-20 NOTE — Progress Notes (Signed)
FPTS Brief Progress Note ? ?S:Went to see patient, patient sleeping comfortably in bed. ? ? ?O: ?BP (!) 115/50 (BP Location: Left Arm)   Pulse 62   Temp 98.6 ?F (37 ?C) (Oral)   Resp 18   Ht 5\' 2"  (1.575 m)   Wt 66 kg   SpO2 96%   BMI 26.63 kg/m?   ? ? ?A/P: ?- Plans per day team ?- Orders reviewed. Labs for AM ordered, which was adjusted as needed.  ? ? ? , MD ?03/20/2022, 2:02 AM ?PGY-1, [place] [facility] Night Resident  ?Please page [phone] with questions.  ? ? ?

## 2022-03-20 NOTE — Progress Notes (Signed)
Family Medicine Teaching Service ?Daily Progress Note ?Intern Pager: [phone] ? ?Patient name: [name] [name] Medical record number: [id number] ?Date of birth: [birth year] Age: 81 y.o. Gender: male ? ?Primary Care Provider: Joycelyn Rua, MD ?Consultants: Cardiology, VVS, IR ?Code Status: Full ? ?Pt Overview and Major Events to Date:  ?4/4 admitted ?4/4 R thoracentesis ? ?Assessment and Plan: ?[name] [name] is a 81 y.o. male presenting with acute exacerbation of HFrEF, slowly diuresing PMH is significant for HFmrEF, CAD on ASA and Plavis s/p CABG x4 (LIMA to LAD, SVG to OM, SVG to PDA, and SVG to diagonal), PAD, Afib. ? ?Acute on chronic heart failure with reduce EF, currently exacerbation ?AKI on CKD 3b ?Weight 66 kg to 65.3 kg. UOP unclear due to ambulation to bathroom without notifying RN. Also has bouts of urinary incontinence. Cr 1.78>2.08. VSS on room air. Bladder scans show minimal retention postvoid. Still has significant JVD. ?-Holding Lasix today to allow for improve kidney function ?-HF following  ?-Strict I/O ?-Daily weights ?-Holding ARB and Spiro ?-Repleting K and Mg ?-Bladder Scans discontinued ?-AM BMP ? ?Severe PAD  H/o CABG x4  Ischemic Cardiomyopathy  ?Hx of 60% carotid occlusion s/p left CEA ?Right Carotid artery occlusion  ?-outpatient VVS follow up for PAD monitoring and possible arteriogram ?-On ASA+Plavix ?-Continue elevation with 1-2 pillows ? ?Atrial Fibrillation  ?Started on Warfarin per cardiology but patient refused today so warfarin d/c. Not in RVR. ?-Cardiology consulted, appreciate recs ?-Heart Failure team consulted per Cardiology, appreciate recs ?-Cardiac monitoring ? ?Urinary Frequency ?-Will consider flomax at discharge ? ?HLD: chronic, stable ?-Lipitor 80 mg qd ?  ?HTN: chronic, stable ?Normotensive. Holding ARB, aldactone, coreg ?-Continue Coreg 3.125 mg bid with meals ? ?Macrocytosis  ?-Multivitamin ? ?FEN/GI: regular ?PPx: lovenox ?Dispo:Home in 2-3 days. Barriers include ongoing  diuresis.  ? ?Subjective:  ?Patient seen and assessed at bedside.  Patient reports no acute complaints at this time.  Patient was agreeable to starting Coumadin but now refusing.  Reports urinating a lot yesterday. Patient had a bout of bowel incontinence before reaching the bathroom. ? ?Objective: ?Temp:  [97.8 ?F (36.6 ?C)-98.6 ?F (37 ?C)] 97.9 ?F (36.6 ?C) (04/07 0454) ?Pulse Rate:  [55-62] 60 (04/07 0454) ?Resp:  [18-19] 18 (04/07 0454) ?BP: (115-130)/(50-69) 130/61 (04/07 0454) ?SpO2:  [96 %] 96 % (04/07 0454) ?Weight:  [143 lb 14.4 oz (65.3 kg)] 143 lb 14.4 oz (65.3 kg) (04/07 0454) ?Physical Exam: ?General: Resting comfortable. NAD ?Cardiovascular: Irregular rate, regular rhythm ?Respiratory: CTAB ?Abdomen: soft, nontender ?Extremities: warm dry ? ?Laboratory: ?Recent Labs  ?Lab 03/17/22 ?0807 03/18/22 ?0331 03/19/22 ?0506  ?WBC 6.9 8.3 6.3  ?HGB 13.6 15.0 12.6*  ?HCT 42.1 44.7 37.8*  ?PLT 160 175 134*  ? ?Recent Labs  ?Lab 03/17/22 ?1034 03/18/22 ?0331 03/19/22 ?0506 03/20/22 ?0300  ?NA  --  140 140 138  ?K  --  4.0 3.6 4.7  ?CL  --  108 110 105  ?CO2  --  24 24 29   ?BUN  --  27* 33* 40*  ?CREATININE  --  1.48* 1.78* 2.08*  ?CALCIUM  --  10.3 9.8 10.2  ?PROT 6.5  --   --   --   ?BILITOT 1.2  --   --   --   ?ALKPHOS 93  --   --   --   ?ALT 24  --   --   --   ?AST 31  --   --   --   ?GLUCOSE  --  83 81 97  ? ? ? ? ?Imaging/Diagnostic Tests: ?No results found. ? ?Park Pope, MD ?03/20/2022, 6:09 AM ?PGY-1,  Family Medicine ?FPTS Intern pager: [phone], text pages welcome ?

## 2022-03-20 NOTE — Progress Notes (Addendum)
? ?Progress Note ? ?Patient Name: [name] [name] ?Date of Encounter: 03/20/2022 ? ?[facility] Cardiologist: Shelva Majestic, MD  ? ?Subjective  ? ?Denies any CP or SOB. Patient does not wish to try Eliquis again. Does not like to take coumadin ? ?Inpatient Medications  ?  ?Scheduled Meds: ? aspirin  81 mg Oral Daily  ? atorvastatin  80 mg Oral q1800  ? carvedilol  3.125 mg Oral BID WC  ? clopidogrel  75 mg Oral Daily  ? enoxaparin (LOVENOX) injection  30 mg Subcutaneous Q24H  ? multivitamin with minerals  1 tablet Oral Daily  ? Warfarin - Pharmacist Dosing Inpatient   Does not apply [id number]  ? ?Continuous Infusions: ? ?PRN Meds: ?acetaminophen **OR** acetaminophen, lidocaine  ? ?Vital Signs  ?  ?Vitals:  ? 03/19/22 0434 03/19/22 1147 03/19/22 1932 03/20/22 0454  ?BP: (!) 96/52 116/69 (!) 115/50 130/61  ?Pulse: (!) 56 (!) 55 62 60  ?Resp: 20 19 18 18   ?Temp: 98.3 ?F (36.8 ?C) 97.8 ?F (36.6 ?C) 98.6 ?F (37 ?C) 97.9 ?F (36.6 ?C)  ?TempSrc: Oral Oral Oral Oral  ?SpO2: 97% 96% 96% 96%  ?Weight: 66 kg   65.3 kg  ?Height:      ? ? ?Intake/Output Summary (Last 24 hours) at 03/20/2022 1029 ?Last data filed at 03/20/2022 [id number] ?Gross per 24 hour  ?Intake 1509 ml  ?Output 1675 ml  ?Net -166 ml  ? ? ?  03/20/2022  ?  4:54 AM 03/19/2022  ?  4:34 AM 03/18/2022  ?  3:46 AM  ?Last 3 Weights  ?Weight (lbs) 143 lb 14.4 oz 145 lb 9.6 oz 143 lb 6.4 oz  ?Weight (kg) 65.273 kg 66.044 kg 65.046 kg  ?   ? ?Telemetry  ?  ?Atrial fibrillation, rate controlled - Personally Reviewed ? ?ECG  ?  ?Atrial fibrillation with HR 98 - Personally Reviewed ? ?Physical Exam  ? ?GEN: No acute distress.   ?Neck: No JVD ?Cardiac: irregularly irregular, no murmurs, rubs, or gallops.  ?Respiratory: Clear to auscultation bilaterally. ?GI: Soft, nontender, non-distended  ?MS: 2-3+ edema; No deformity. ?Neuro:  Nonfocal  ?Psych: Normal affect  ? ?Labs  ?  ?High Sensitivity Troponin:   ?Recent Labs  ?Lab 03/17/22 ?1034 03/17/22 ?1207  ?TROPONINIHS 39* 35*  ?   ?Chemistry ?Recent  Labs  ?Lab 03/17/22 ?1034 03/18/22 ?0331 03/19/22 ?0506 03/20/22 ?0300  ?NA  --  140 140 138  ?K  --  4.0 3.6 4.7  ?CL  --  108 110 105  ?CO2  --  24 24 29   ?GLUCOSE  --  83 81 97  ?BUN  --  27* 33* 40*  ?CREATININE  --  1.48* 1.78* 2.08*  ?CALCIUM  --  10.3 9.8 10.2  ?MG  --  1.9 1.8 2.2  ?PROT 6.5  --   --   --   ?ALBUMIN 3.0*  --   --   --   ?AST 31  --   --   --   ?ALT 24  --   --   --   ?ALKPHOS 93  --   --   --   ?BILITOT 1.2  --   --   --   ?GFRNONAA  --  47* 38* 31*  ?ANIONGAP  --  8 6 4*  ?  ?Lipids No results for input(s): CHOL, TRIG, HDL, LABVLDL, LDLCALC, CHOLHDL in the last 168 hours.  ?Hematology ?Recent Labs  ?Lab 03/17/22 ?0807 03/18/22 ?0331 03/19/22 ?  0506  ?WBC 6.9 8.3 6.3  ?RBC 3.89* 4.27 3.61*  ?HGB 13.6 15.0 12.6*  ?HCT 42.1 44.7 37.8*  ?MCV 108.2* 104.7* 104.7*  ?MCH 35.0* 35.1* 34.9*  ?MCHC 32.3 33.6 33.3  ?RDW 15.9* 15.9* 15.9*  ?PLT 160 175 134*  ? ?Thyroid No results for input(s): TSH, FREET4 in the last 168 hours.  ?BNP ?Recent Labs  ?Lab 03/17/22 ?1034  ?BNP 714.2*  ?  ?DDimer  ?Recent Labs  ?Lab 03/17/22 ?1034  ?DDIMER 0.75*  ?  ? ?Radiology  ?  ?VAS US CAROTID ? ?Result Date: 03/18/2022 ?Carotid Arterial Duplex Study Patient Name:  [name] [name]  Date of Exam:   03/18/2022 Medical Rec #: [id number]    Accession #:    [id number] Date of Birth: 03/27/1941    Patient Gender: M Patient Age:   81 years Exam Location:  [facility] Procedure:      VAS US CAROTID Referring Phys: Aldona Bar RHYNE --------------------------------------------------------------------------------  Indications:       Carotid stenosis. Risk Factors:      Hypertension, hyperlipidemia, Diabetes, past history of                    smoking, PAD. Other Factors:     Left endarterectomy on 03/10/2016. Comparison Study:  11/12/2016 - Right ICA 60% stenosis, left CEA no evidence of                    restenosis. Performing Technologist: Archie Patten RVS  Examination Guidelines: A complete evaluation includes B-mode imaging,  spectral Doppler, color Doppler, and power Doppler as needed of all accessible portions of each vessel. Bilateral testing is considered an integral part of a complete examination. Limited examinations for reoccurring indications may be performed as noted.  Right Carotid Findings: +----------+--------+--------+--------+----------------------+----------------+           PSV cm/sEDV cm/sStenosisPlaque Description    Comments         +----------+--------+--------+--------+----------------------+----------------+ CCA Prox  101                     heterogenous                           +----------+--------+--------+--------+----------------------+----------------+ CCA Distal94                      calcific and irregular                 +----------+--------+--------+--------+----------------------+----------------+ ICA Prox                  Occludedcalcific and irregularappears occluded +----------+--------+--------+--------+----------------------+----------------+ ICA Mid                   Occluded                      appears occluded +----------+--------+--------+--------+----------------------+----------------+ ICA Distal                Occluded                      appears occluded +----------+--------+--------+--------+----------------------+----------------+ ECA       228                                                            +----------+--------+--------+--------+----------------------+----------------+ +----------+--------+-------+--------+-------------------+  PSV cm/sEDV cmsDescribeArm Pressure (mmHG) +----------+--------+-------+--------+-------------------+ [id number]            Stenotic                    +----------+--------+-------+--------+-------------------+ +---------+--------+--------+--------+ VertebralPSV cm/sEDV cm/soccluded +---------+--------+--------+--------+  Left Carotid Findings:  +---------+--------+-------+--------+---------------------------------+--------+          PSV cm/sEDV    StenosisPlaque Description               Comments                  cm/s                                                     +---------+--------+-------+--------+---------------------------------+--------+ CCA Prox 162     17             heterogenous                              +---------+--------+-------+--------+---------------------------------+--------+ CCA      104     10             heterogenous                              Distal                                                                    +---------+--------+-------+--------+---------------------------------+--------+ ICA Prox 239     42     40-59%  heterogenous, irregular and                                               calcific                                  +---------+--------+-------+--------+---------------------------------+--------+ ICA Mid  142     40                                                       +---------+--------+-------+--------+---------------------------------+--------+ ICA      112     31                                                       Distal                                                                    +---------+--------+-------+--------+---------------------------------+--------+  ECA      192                                                              +---------+--------+-------+--------+---------------------------------+--------+ +----------+--------+--------+--------+-------------------+           PSV cm/sEDV cm/sDescribeArm Pressure (mmHG) +----------+--------+--------+--------+-------------------+ [id number]                                          +----------+--------+--------+--------+-------------------+ +---------+--------+--+--------+--+---------+ VertebralPSV cm/s65EDV cm/s16Antegrade  +---------+--------+--+--------+--+---------+   Summary: Right Carotid: Right ICA appears occluded with heavy calcific, focal plaque                noted at the origin of the ICA. Left Carotid: Velocities in the left ICA are consistent with a 40-59% stenosis. Vertebrals:  Left vertebral artery demonstrates antegrade flow. Right vertebral              artery demonstrates an occlusion. Subclavians: High grade st

## 2022-03-21 DIAGNOSIS — I509 Heart failure, unspecified: Secondary | ICD-10-CM | POA: Diagnosis not present

## 2022-03-21 DIAGNOSIS — M7989 Other specified soft tissue disorders: Secondary | ICD-10-CM | POA: Diagnosis not present

## 2022-03-21 DIAGNOSIS — I5082 Biventricular heart failure: Secondary | ICD-10-CM | POA: Diagnosis not present

## 2022-03-21 DIAGNOSIS — I5021 Acute systolic (congestive) heart failure: Secondary | ICD-10-CM | POA: Diagnosis not present

## 2022-03-21 DIAGNOSIS — I272 Pulmonary hypertension, unspecified: Secondary | ICD-10-CM | POA: Diagnosis not present

## 2022-03-21 LAB — BASIC METABOLIC PANEL
Anion gap: 8 (ref 5–15)
BUN: 41 mg/dL — ABNORMAL HIGH (ref 8–23)
CO2: 25 mmol/L (ref 22–32)
Calcium: 9.8 mg/dL (ref 8.9–10.3)
Chloride: 102 mmol/L (ref 98–111)
Creatinine, Ser: 1.7 mg/dL — ABNORMAL HIGH (ref 0.61–1.24)
GFR, Estimated: 40 mL/min — ABNORMAL LOW (ref >60)
Glucose, Bld: 82 mg/dL (ref 70–99)
Potassium: 4 mmol/L (ref 3.5–5.1)
Sodium: 135 mmol/L (ref 135–145)

## 2022-03-21 NOTE — Progress Notes (Signed)
FPTS Brief Progress Note ? ?S:Went to round on patient, found patient sitting up in chair, awake. He has no complaints or concerns, and appreciates that he just recently woke up.  ? ? ?O: ?BP (!) 89/65 (BP Location: Right Arm)   Pulse 67   Temp 97.8 ?F (36.6 ?C) (Oral)   Resp 18   Ht 5\' 2"  (1.575 m)   Wt 65.3 kg   SpO2 98%   BMI 26.32 kg/m?   ?General: NAD, sitting comfortably ? ?A/P: ?- Plans per day team ?- Orders reviewed. Labs for AM ordered, which was adjusted as needed.  ? ? ? , MD ?03/21/2022, 1:21 AM ?PGY-1, [facility] Night Resident  ?Please page [phone] with questions.  ? ? ?

## 2022-03-21 NOTE — Progress Notes (Addendum)
? ?Progress Note ? ?Patient Name: [name] [name] ?Date of Encounter: 03/21/2022 ? ?[facility] Cardiologist: Shelva Majestic, MD  ? ?Subjective  ? ?[name] [name] is doing well. He slept lying flat. Denies CP or SOB. Still has some swelling in his feet ? ?Inpatient Medications  ?  ?Scheduled Meds: ? aspirin  81 mg Oral Daily  ? atorvastatin  80 mg Oral q1800  ? clopidogrel  75 mg Oral Daily  ? enoxaparin (LOVENOX) injection  30 mg Subcutaneous Q24H  ? furosemide  80 mg Intravenous BID  ? multivitamin with minerals  1 tablet Oral Daily  ? potassium chloride  40 mEq Oral BID  ? ?Continuous Infusions: ? milrinone 0.125 mcg/kg/min (03/20/22 1339)  ? ?PRN Meds: ?acetaminophen **OR** acetaminophen, lidocaine  ? ?Vital Signs  ?  ?Vitals:  ? 03/20/22 1550 03/20/22 1820 03/20/22 1940 03/21/22 0349  ?BP: 99/73 90/60 (!) 89/65 110/62  ?Pulse:   67 80  ?Resp:   18 18  ?Temp:   97.8 ?F (36.6 ?C) 97.6 ?F (36.4 ?C)  ?TempSrc:   Oral Oral  ?SpO2:   98% 95%  ?Weight:    65.3 kg  ?Height:      ? ? ?Intake/Output Summary (Last 24 hours) at 03/21/2022 0842 ?Last data filed at 03/21/2022 [id number] ?Gross per 24 hour  ?Intake 1110.51 ml  ?Output 4276 ml  ?Net -3165.49 ml  ? ? ?  03/21/2022  ?  3:49 AM 03/20/2022  ?  4:54 AM 03/19/2022  ?  4:34 AM  ?Last 3 Weights  ?Weight (lbs) 143 lb 14.4 oz 143 lb 14.4 oz 145 lb 9.6 oz  ?Weight (kg) 65.273 kg 65.273 kg 66.044 kg  ?   ? ?Telemetry  ?  ?Afib, rate controlled - Personally Reviewed ? ?ECG  ?  ?N/A- Personally Reviewed ? ?Physical Exam  ? ?GEN: Cachectic ?Neck: No JVD ?Cardiac: irregularly irregular, no murmurs, rubs, or gallops.  ?Respiratory: Clear to auscultation bilaterally. Mild decrease BS in the bases,No rales or rhonchi  ?GI: Soft, nontender, non-distended  ?MS: 2-3+ edema in the feet predominantly; No deformity. ?Neuro:  Nonfocal  ?Psych: Normal affect  ? ?Labs  ?  ?High Sensitivity Troponin:   ?Recent Labs  ?Lab 03/17/22 ?1034 03/17/22 ?1207  ?TROPONINIHS 39* 35*  ?   ?Chemistry ?Recent Labs  ?Lab  03/17/22 ?1034 03/18/22 ?0331 03/19/22 ?0506 03/20/22 ?0300 03/21/22 ?[id number]  ?NA  --  140 140 138 135  ?K  --  4.0 3.6 4.7 4.0  ?CL  --  108 110 105 102  ?CO2  --  24 24 29 25   ?GLUCOSE  --  83 81 97 82  ?BUN  --  27* 33* 40* 41*  ?CREATININE  --  1.48* 1.78* 2.08* 1.70*  ?CALCIUM  --  10.3 9.8 10.2 9.8  ?MG  --  1.9 1.8 2.2  --   ?PROT 6.5  --   --   --   --   ?ALBUMIN 3.0*  --   --   --   --   ?AST 31  --   --   --   --   ?ALT 24  --   --   --   --   ?ALKPHOS 93  --   --   --   --   ?BILITOT 1.2  --   --   --   --   ?GFRNONAA  --  47* 38* 31* 40*  ?ANIONGAP  --  8 6 4* 8  ?  ?  Lipids No results for input(s): CHOL, TRIG, HDL, LABVLDL, LDLCALC, CHOLHDL in the last 168 hours.  ?Hematology ?Recent Labs  ?Lab 03/17/22 ?0807 03/18/22 ?0331 03/19/22 ?0506  ?WBC 6.9 8.3 6.3  ?RBC 3.89* 4.27 3.61*  ?HGB 13.6 15.0 12.6*  ?HCT 42.1 44.7 37.8*  ?MCV 108.2* 104.7* 104.7*  ?MCH 35.0* 35.1* 34.9*  ?MCHC 32.3 33.6 33.3  ?RDW 15.9* 15.9* 15.9*  ?PLT 160 175 134*  ? ?Thyroid No results for input(s): TSH, FREET4 in the last 168 hours.  ?BNP ?Recent Labs  ?Lab 03/17/22 ?1034  ?BNP 714.2*  ?  ?DDimer  ?Recent Labs  ?Lab 03/17/22 ?1034  ?DDIMER 0.75*  ?  ? ?Radiology  ?  ?No results found. ? ?Cardiac Studies  ? ?Echo 06/07/2020 ?1. Left ventricular ejection fraction, by estimation, is 45 to 50% with  ?hypokinesis of the inferior, inferolateral walls (base, mid) The left  ?ventricle has mildly decreased function. Left ventricular diastolic  ?parameters are indeterminate.  ? 2. Right ventricular systolic function is normal. The right ventricular  ?size is normal. There is moderately elevated pulmonary artery systolic  ?pressure.  ? 3. Left atrial size was severely dilated.  ? 4. Right atrial size was severely dilated.  ? 5. The mitral valve is normal in structure. Moderate to severe mitral  ?valve regurgitation.  ? 6. Tricuspid valve regurgitation is mild to moderate.  ? 7. The aortic valve is normal in structure. Aortic valve regurgitation  is  ?mild.  ? 8. Aortic dilatation noted. There is mild dilatation of the ascending  ?aorta measuring 39 mm.  ? 9. The inferior vena cava is normal in size with greater than 50%  ?respiratory variability, suggesting right atrial pressure of 3 mmHg.  ? ? ?Echo 03/17/2022 ? 1. Left ventricular ejection fraction, by estimation, is 30 to 35%. Left  ?ventricular ejection fraction by 3D volume is 26 %. The left ventricle has  ?moderately decreased function. The left ventricle demonstrates global  ?hypokinesis. There is mild  ?concentric left ventricular hypertrophy. Left ventricular diastolic  ?function could not be evaluated. There is the interventricular septum is  ?flattened in diastole ('D' shaped left ventricle), consistent with right  ?ventricular volume overload.  ? 2. Right ventricular systolic function is moderately reduced. The right  ?ventricular size is mildly enlarged. Mildly increased right ventricular  ?wall thickness. There is severely elevated pulmonary artery systolic  ?pressure.  ? 3. Left atrial size was moderately dilated.  ? 4. Right atrial size was severely dilated.  ? 5. Large pleural effusion in the left lateral region.  ? 6. There is mitral annular dilation and posterior leaflet tethering.  ?Effective regurgitant orifice area is 0.35 cm sq, regurgitant volume is 67  ?ml, regurgitant fraction is 61%. The mitral valve is abnormal. Severe  ?mitral valve regurgitation.  ? 7. Tricuspid valve regurgitation is severe.  ? 8. The aortic valve is tricuspid. There is mild thickening of the aortic  ?valve. Aortic valve regurgitation is moderate. Aortic valve  ?sclerosis/calcification is present, without any evidence of aortic  ?stenosis.  ? ?Comparison(s): Prior images reviewed side by side. There is worsening of  ?left ventricular dysfunction, mitral insufficiency, tricuspid  ?insufficiency and pulmonary hypertension.  ? ?Patient Profile  ?   ?81 y.o. male with PMH of chronic combined systolic and diastolic  CHF, CAD s/p CABG x4, permanent atrial fibrillation not on anticoagulation, ICM, HTN, HLD and PAD who presented with acute CHF exacerbation.  ? ?Assessment & Plan  ?  ?Decompensated  BI-V Heart Failure, Unknown Etiology presumably ischemic. NYHA Class IIIb; Stage C->D: now on milrinonen ? - Echo 06/07/2020 EF 45-50%, hypokinesis of inferior and inferolateral wall, normal RV with moderately elevated PASP, severe biatrial enlargement, moderate to severe [name], mild to moderate TR ? - Echo 03/17/2022 EF 30-35%, mild LVH, flattened interventricular septum during diastole suggestive of RV volume overload, moderately reduced RV systolic function, severe [name] ? - Mitral valve regurgitation is now severe ; goal for afterload reduction and diuresis; Considering surgery/mitra clip; likely higher risk for surgery [ mitral valve replacement/repair is 16.5 % / 28.1 % respectively]; can discuss options after RHC ? - Seen by heart failure with recommendation for LHC/RHC when more euvolemic ? - continue milrinone 0.125 ; crt is stable improved from yesterday 2.08->1.7 ? - lasix 80 mg IV BID ? ?CAD s/p CABG x 4: denies any CP. EF worsened on echo in this admission when compare to the previous echo 2021.  On DAPT ? ?Acute on chronic renal insufficiency: Cr 1.40 is his baseline. Cardiorenal, improving on milrinone ? ?Permanent atrial fibrillation ? - rate controlled, previously had nose bleeding on Eliquis, not interested to try Eliquis again due to both cost and nose bleed ? - started on coumadin, however he says he does not like the idea of taking coumadin and control his diet. He likely will not be compliant with coumadin ? - he is aware of higher chance of stroke without blood thinner. Fortunately he has a h/o LAA clipping ? - Dr. Gwenlyn Found had long conversation with the patient, will stop coumadin, restart home ASA and plavix ? ?ICM ? ?HTN ? ?HLD ? ?   ? ?For questions or updates, please contact [facility] ?Please consult [email or web address]  for contact info under  ? ?  ?   ?Signed, ?Janina Mayo, MD  ?03/21/2022, 8:42 AM   ? ? ?

## 2022-03-21 NOTE — Progress Notes (Signed)
Family Medicine Teaching Service ?Daily Progress Note ?Intern Pager: [phone] ? ?Patient name: [name] [name] Medical record number: [id number] ?Date of birth: [birth year] Age: 81 y.o. Gender: male ? ?Primary Care Provider: Joycelyn Rua, MD ?Consultants: Cardiology, VVS, IR, HF ?Code Status: Full ? ?Pt Overview and Major Events to Date:  ?4/4 admitted ?4/4 right thoracentesis ? ?Assessment and Plan: ?[name] is a 81 year old male presenting with acute exacerbation of HFrEF.  Has no history significant for HFmrEF, CAD on ASA and Plavix status post CABG x4, PAD, A-fib ? ?Acute on chronic heart failure with reduce EF, currently exacerbation ?Weight unchanged 65.3 kg. UOP 3.87 L. Net output 2.86 L. VSS on room air. Still has JVD. Cr 2.08>1.7. ?-Heart failure team following, appreciate recommendations ?-Cardiology following, appreciate recs ?-Continue milrinone 0.125 for inotropic support ?-Increase IV Lasix to 80 mg twice daily ?-Gentle LE compression with TED hoses ?-Coreg discontinued ?-LHC/RHC once more euvolemic ?-Strict I/O ?-Daily weights ? ?AKI on CKD 3b ?Likely cardio renal. Improving Cr ?-Continue milrinone as above ?-Continue lasix as above ? ?Severe PAD ?CAD s/p CABG x4 ?Hx of 60% carotid occlusion s/p left CEA ?Right Carotid artery occlusion  ?-Continue ASA+Plavix ?-Continue elevation with 1-2 pillows ?-Outpatient VVS for PAD monitoring and arteriogram ? ?Permanent Atrial Fibrillation  ?Coreg rate controlled ?-Heart failure following, appreciate recs ?-Cardiology following, appreciate recs ?-Cardiac monitoring ? ?Urinary Frequency ?-Consider flomax at d/c.  ? ?HLD: chronic, stable ?-Lipitor 80 mg qd ?  ?HTN: chronic, stable ?Normotensive. Holding ARB, aldactone, coreg ?-Continue Coreg 3.125 mg bid with meals ?  ?Macrocytosis  ?-Multivitamin ? ?FEN/GI: regular ?PPx: lovenox ?Dispo:Home in 3 or more days. Barriers include HFrEF.  ? ?Subjective:  ?Patient seen and assessed in recliner.  Patient denies any acute  complaints.  Patient had another episode of bowel incontinence but denies diarrhea.  ? ?Objective: ?Temp:  [97.6 ?F (36.4 ?C)-97.8 ?F (36.6 ?C)] 97.6 ?F (36.4 ?C) (04/08 0349) ?Pulse Rate:  [60-80] 80 (04/08 0349) ?Resp:  [18] 18 (04/08 0349) ?BP: (88-136)/(60-73) 110/62 (04/08 0349) ?SpO2:  [95 %-99 %] 95 % (04/08 0349) ?Weight:  [65.3 kg] 65.3 kg (04/08 0349) ?Physical Exam: ?General: Resting comfortably in recliner. NAD ?Cardiovascular: irregular rhythm, regular rate, JVD still present but improved ?Respiratory: CTAB ?Abdomen: soft nontender ?Extremities: warm dry ? ?Laboratory: ?Recent Labs  ?Lab 03/17/22 ?0807 03/18/22 ?0331 03/19/22 ?0506  ?WBC 6.9 8.3 6.3  ?HGB 13.6 15.0 12.6*  ?HCT 42.1 44.7 37.8*  ?PLT 160 175 134*  ? ?Recent Labs  ?Lab 03/17/22 ?1034 03/18/22 ?0331 03/19/22 ?0506 03/20/22 ?0300 03/21/22 ?5009  ?NA  --    < > 140 138 135  ?K  --    < > 3.6 4.7 4.0  ?CL  --    < > 110 105 102  ?CO2  --    < > 24 29 25   ?BUN  --    < > 33* 40* 41*  ?CREATININE  --    < > 1.78* 2.08* 1.70*  ?CALCIUM  --    < > 9.8 10.2 9.8  ?PROT 6.5  --   --   --   --   ?BILITOT 1.2  --   --   --   --   ?ALKPHOS 93  --   --   --   --   ?ALT 24  --   --   --   --   ?AST 31  --   --   --   --   ?  GLUCOSE  --    < > 81 97 82  ? < > = values in this interval not displayed.  ? ? ? ? ?Imaging/Diagnostic Tests: ?No results found. ? ?Park Pope, MD ?03/21/2022, 8:28 AM ?PGY-1, [facility] ?FPTS Intern pager: [phone], text pages welcome ?

## 2022-03-22 DIAGNOSIS — I5082 Biventricular heart failure: Secondary | ICD-10-CM | POA: Diagnosis not present

## 2022-03-22 DIAGNOSIS — I272 Pulmonary hypertension, unspecified: Secondary | ICD-10-CM | POA: Diagnosis not present

## 2022-03-22 DIAGNOSIS — I5021 Acute systolic (congestive) heart failure: Secondary | ICD-10-CM | POA: Diagnosis not present

## 2022-03-22 DIAGNOSIS — M7989 Other specified soft tissue disorders: Secondary | ICD-10-CM | POA: Diagnosis not present

## 2022-03-22 DIAGNOSIS — I5041 Acute combined systolic (congestive) and diastolic (congestive) heart failure: Secondary | ICD-10-CM | POA: Diagnosis not present

## 2022-03-22 LAB — BASIC METABOLIC PANEL
Anion gap: 6 (ref 5–15)
BUN: 41 mg/dL — ABNORMAL HIGH (ref 8–23)
CO2: 31 mmol/L (ref 22–32)
Calcium: 9.8 mg/dL (ref 8.9–10.3)
Chloride: 98 mmol/L (ref 98–111)
Creatinine, Ser: 1.71 mg/dL — ABNORMAL HIGH (ref 0.61–1.24)
GFR, Estimated: 40 mL/min — ABNORMAL LOW (ref >60)
Glucose, Bld: 89 mg/dL (ref 70–99)
Potassium: 4.6 mmol/L (ref 3.5–5.1)
Sodium: 135 mmol/L (ref 135–145)

## 2022-03-22 LAB — CULTURE, BODY FLUID W GRAM STAIN -BOTTLE: Culture: NO GROWTH

## 2022-03-22 LAB — MAGNESIUM: Magnesium: 2.1 mg/dL (ref 1.7–2.4)

## 2022-03-22 NOTE — Progress Notes (Signed)
? ?Progress Note ? ?Patient Name: [name] [name] ?Date of Encounter: 03/22/2022 ? ?[facility] Cardiologist: Shelva Majestic, MD  ? ?Subjective  ? ?[name] [name] is doing well. He slept lying flat. Denies CP or SOB. Still has some swelling in his feet ? ?Inpatient Medications  ?  ?Scheduled Meds: ? aspirin  81 mg Oral Daily  ? atorvastatin  80 mg Oral q1800  ? clopidogrel  75 mg Oral Daily  ? enoxaparin (LOVENOX) injection  30 mg Subcutaneous Q24H  ? furosemide  80 mg Intravenous BID  ? multivitamin with minerals  1 tablet Oral Daily  ? potassium chloride  40 mEq Oral BID  ? ?Continuous Infusions: ? milrinone 0.125 mcg/kg/min (03/21/22 1320)  ? ?PRN Meds: ?acetaminophen **OR** acetaminophen, lidocaine  ? ?Vital Signs  ?  ?Vitals:  ? 03/21/22 0349 03/21/22 1054 03/21/22 2022 03/22/22 0250  ?BP: 110/62 132/72 (!) 135/58 (!) 158/63  ?Pulse: 80 67 68 73  ?Resp: 18 17 17 18   ?Temp: 97.6 ?F (36.4 ?C) 97.7 ?F (36.5 ?C) 98.3 ?F (36.8 ?C) 97.7 ?F (36.5 ?C)  ?TempSrc: Oral Oral Oral Oral  ?SpO2: 95% 99%  100%  ?Weight: 65.3 kg   62 kg  ?Height:      ? ? ?Intake/Output Summary (Last 24 hours) at 03/22/2022 0816 ?Last data filed at 03/22/2022 0756 ?Gross per 24 hour  ?Intake 887.11 ml  ?Output 4000 ml  ?Net -3112.89 ml  ? ? ?  03/22/2022  ?  2:50 AM 03/21/2022  ?  3:49 AM 03/20/2022  ?  4:54 AM  ?Last 3 Weights  ?Weight (lbs) 136 lb 9.6 oz 143 lb 14.4 oz 143 lb 14.4 oz  ?Weight (kg) 61.961 kg 65.273 kg 65.273 kg  ?   ? ?Telemetry  ?  ?Afib, rate controlled - Personally Reviewed ? ?ECG  ?  ?N/A- Personally Reviewed ? ?Physical Exam  ? ?GEN: Cachectic ?Neck: + JVD ?Cardiac: irregularly irregular, no murmurs, rubs, or gallops.  ?Respiratory: Clear to auscultation bilaterally. Mild decrease BS in the bases,No rales or rhonchi  ?GI: Soft, nontender, non-distended  ?MS: 2-3+ edema in the feet predominantly; No deformity. ?Neuro:  Nonfocal  ?Psych: Normal affect  ? ?Labs  ?  ?High Sensitivity Troponin:   ?Recent Labs  ?Lab 03/17/22 ?1034 03/17/22 ?1207   ?TROPONINIHS 39* 35*  ?   ?Chemistry ?Recent Labs  ?Lab 03/17/22 ?1034 03/18/22 ?0331 03/19/22 ?0506 03/20/22 ?0300 03/21/22 ?[id number] 03/22/22 ?[id number]  ?NA  --    < > 140 138 135 135  ?K  --    < > 3.6 4.7 4.0 4.6  ?CL  --    < > 110 105 102 98  ?CO2  --    < > 24 29 25 31   ?GLUCOSE  --    < > 81 97 82 89  ?BUN  --    < > 33* 40* 41* 41*  ?CREATININE  --    < > 1.78* 2.08* 1.70* 1.71*  ?CALCIUM  --    < > 9.8 10.2 9.8 9.8  ?MG  --    < > 1.8 2.2  --  2.1  ?PROT 6.5  --   --   --   --   --   ?ALBUMIN 3.0*  --   --   --   --   --   ?AST 31  --   --   --   --   --   ?ALT 24  --   --   --   --   --   ?  ALKPHOS 93  --   --   --   --   --   ?BILITOT 1.2  --   --   --   --   --   ?GFRNONAA  --    < > 38* 31* 40* 40*  ?ANIONGAP  --    < > 6 4* 8 6  ? < > = values in this interval not displayed.  ?  ?Lipids No results for input(s): CHOL, TRIG, HDL, LABVLDL, LDLCALC, CHOLHDL in the last 168 hours.  ?Hematology ?Recent Labs  ?Lab 03/17/22 ?0807 03/18/22 ?0331 03/19/22 ?0506  ?WBC 6.9 8.3 6.3  ?RBC 3.89* 4.27 3.61*  ?HGB 13.6 15.0 12.6*  ?HCT 42.1 44.7 37.8*  ?MCV 108.2* 104.7* 104.7*  ?MCH 35.0* 35.1* 34.9*  ?MCHC 32.3 33.6 33.3  ?RDW 15.9* 15.9* 15.9*  ?PLT 160 175 134*  ? ?Thyroid No results for input(s): TSH, FREET4 in the last 168 hours.  ?BNP ?Recent Labs  ?Lab 03/17/22 ?1034  ?BNP 714.2*  ?  ?DDimer  ?Recent Labs  ?Lab 03/17/22 ?1034  ?DDIMER 0.75*  ?  ? ?Radiology  ?  ?No results found. ? ?Cardiac Studies  ? ?Echo 06/07/2020 ?1. Left ventricular ejection fraction, by estimation, is 45 to 50% with  ?hypokinesis of the inferior, inferolateral walls (base, mid) The left  ?ventricle has mildly decreased function. Left ventricular diastolic  ?parameters are indeterminate.  ? 2. Right ventricular systolic function is normal. The right ventricular  ?size is normal. There is moderately elevated pulmonary artery systolic  ?pressure.  ? 3. Left atrial size was severely dilated.  ? 4. Right atrial size was severely dilated.  ? 5. The  mitral valve is normal in structure. Moderate to severe mitral  ?valve regurgitation.  ? 6. Tricuspid valve regurgitation is mild to moderate.  ? 7. The aortic valve is normal in structure. Aortic valve regurgitation is  ?mild.  ? 8. Aortic dilatation noted. There is mild dilatation of the ascending  ?aorta measuring 39 mm.  ? 9. The inferior vena cava is normal in size with greater than 50%  ?respiratory variability, suggesting right atrial pressure of 3 mmHg.  ? ? ?Echo 03/17/2022 ? 1. Left ventricular ejection fraction, by estimation, is 30 to 35%. Left  ?ventricular ejection fraction by 3D volume is 26 %. The left ventricle has  ?moderately decreased function. The left ventricle demonstrates global  ?hypokinesis. There is mild  ?concentric left ventricular hypertrophy. Left ventricular diastolic  ?function could not be evaluated. There is the interventricular septum is  ?flattened in diastole ('D' shaped left ventricle), consistent with right  ?ventricular volume overload.  ? 2. Right ventricular systolic function is moderately reduced. The right  ?ventricular size is mildly enlarged. Mildly increased right ventricular  ?wall thickness. There is severely elevated pulmonary artery systolic  ?pressure.  ? 3. Left atrial size was moderately dilated.  ? 4. Right atrial size was severely dilated.  ? 5. Large pleural effusion in the left lateral region.  ? 6. There is mitral annular dilation and posterior leaflet tethering.  ?Effective regurgitant orifice area is 0.35 cm sq, regurgitant volume is 67  ?ml, regurgitant fraction is 61%. The mitral valve is abnormal. Severe  ?mitral valve regurgitation.  ? 7. Tricuspid valve regurgitation is severe.  ? 8. The aortic valve is tricuspid. There is mild thickening of the aortic  ?valve. Aortic valve regurgitation is moderate. Aortic valve  ?sclerosis/calcification is present, without any evidence of aortic  ?stenosis.  ? ?  Comparison(s): Prior images reviewed side by side. There  is worsening of  ?left ventricular dysfunction, mitral insufficiency, tricuspid  ?insufficiency and pulmonary hypertension.  ? ?Patient Profile  ?   ?81 y.o. male with PMH of chronic combined systolic and diastolic CHF, CAD s/p CABG x4, permanent atrial fibrillation not on anticoagulation, ICM, HTN, HLD and PAD who presented with acute CHF exacerbation.  ? ?Assessment & Plan  ?  ?Decompensated BI-V Heart Failure, Unknown Etiology presumably ischemic. NYHA Class IIIb; Stage C->D: now on milrinone ? - Echo 06/07/2020 EF 45-50%, hypokinesis of inferior and inferolateral wall, normal RV with moderately elevated PASP, severe biatrial enlargement, moderate to severe MR, mild to moderate TR ? - Echo 03/17/2022 EF 30-35%, mild LVH, flattened interventricular septum during diastole suggestive of RV volume overload, moderately reduced RV systolic function, severe MR ? - Mitral valve regurgitation is now severe ; goal for afterload reduction and diuresis; Considering surgery/mitra clip; likely higher risk for surgery [ mitral valve replacement/repair is 16.5 % / 28.1 % respectively]; can discuss options after RHC ? - Seen by heart failure with recommendation for LHC/RHC when more euvolemic ? - continue milrinone 0.125 ; crt is stable  1.7 ? - lasix 80 mg IV BID ? ?CAD s/p CABG x 4: denies any CP. EF worsened on echo in this admission when compare to the previous echo 2021.  On DAPT ? ?Acute on chronic renal insufficiency: Cr 1.40 is his baseline. Cardiorenal, improving on milrinone ? ?Permanent atrial fibrillation ? - rate controlled, previously had nose bleeding on Eliquis, not interested to try Eliquis again due to both cost and nose bleed ? - started on coumadin, however he says he does not like the idea of taking coumadin and control his diet. He likely will not be compliant with coumadin ? - he is aware of higher chance of stroke without blood thinner. Fortunately he has a h/o LAA clipping ? - Dr. Gwenlyn Found had long conversation  with the patient, will stop coumadin, restart home ASA and plavix ? ?ICM ? ?HTN ? ?HLD ? ?   ? ?For questions or updates, please contact [facility] ?Please consult [email or web address] for contact info

## 2022-03-22 NOTE — Plan of Care (Signed)

## 2022-03-22 NOTE — Progress Notes (Signed)
FPTS Interim Night Progress Note ? ?S:Patient sleeping comfortably.  Rounded with primary night RN.  No concerns voiced.  No orders required.   ? ?O: ?Today's Vitals  ? 03/21/22 0349 03/21/22 0815 03/21/22 1054 03/21/22 2022  ?BP: 110/62  132/72 (!) 135/58  ?Pulse: 80  67 68  ?Resp: 18  17 17   ?Temp: 97.6 ?F (36.4 ?C)  97.7 ?F (36.5 ?C) 98.3 ?F (36.8 ?C)  ?TempSrc: Oral  Oral Oral  ?SpO2: 95%  99%   ?Weight: 65.3 kg     ?Height:      ?PainSc:  0-No pain    ? ? ? ? ?A/P: ?HFrEF- stable ?Afib- stable ?Continue current management ? ? MD ?PGY-3, [facility] ?Service pager [phone]   ?

## 2022-03-22 NOTE — Progress Notes (Signed)
Family Medicine Teaching Service ?Daily Progress Note ?Intern Pager: [phone] ? ?Patient name: [name] [name] Medical record number: [id number] ?Date of birth: 19-Jun-1941 Age: 81 y.o. Gender: male ? ?Primary Care Provider: Orpah Melter, MD ?Consultants: Cardiology, VVS, IR, HF ?Code Status: Full ? ?Pt Overview and Major Events to Date:  ?4/4 - Admitted, R thoracentesis ? ?Assessment and Plan: ?[name] [name] is a 81 y.o. male that presented with acute exacerbation of HFrEF, currently being aggressively diuresed.  PMH significant for-rest, CAD on ASA and Plavix s/p CABG x4, PAD, A-fib, hyperlipidemia, hypertension, CKD stage III ? ?Decompensated biventricular heart failure  HFrEF ?Improving with aggressive diuresis, UOP of 3 L in the prior 24 hours, weight change since yesterday documented as 7lbs, unsure if this is true weight change in 1 day.  ?- Cardiology following, appreciate recs ?- Heart failure team following, appreciate recs ?- Plan for LHC/RHC when more euvolemic ?- Continue milrinone ?- Lasix 80 mg IV twice daily ?- Strict I's and O's with daily weights ?- Continue TED hose ?- Scheduled potassium repletion of 40 mEq BID ? ?Acute on chronic renal insufficiency ?Stable, improved from admission.   ?- Monitor with daily BMPs ? ?Severe PAD ?CAD s/p CABG x4 ?Right carotid artery occlusion ?- Continue ASA and Plavix ?- Elevate BLE with 1-2 pillows ?- Outpatient follow-up ? ?Permanent A-fib, history of LAA clipping ?- Rate controlled ?- Continue home ASA and Plavix ?- No anticoagulation per patient preference ? ?Goals of care ?Patient with severe medical conditions and intermittently noncompliant with medications, currently full code.  Will benefit from goals of care discussions. ?- Consider palliative on 4/10 ? ?Hyperlipidemia ?- Continue Lipitor 80 mg daily ? ?HTN ?-Coreg 3.125 mg twice daily ?- Holding ARB, Aldactone ? ? ?FEN/GI: Heart healthy ?PPx: Lovenox ?Dispo:Home pending clinical improvement . Barriers  include continued cardiac work-up and aggressive diuresis.  ? ?Subjective:  ?Patient reports that he is not having any issues with his breathing.  He feels like his swelling is getting a lot better.  He has no concerns at this time. ? ?Objective: ?Temp:  [97.7 ?F (36.5 ?C)-98.3 ?F (36.8 ?C)] 97.7 ?F (36.5 ?C) (04/09 0250) ?Pulse Rate:  [67-73] 73 (04/09 0250) ?Resp:  [17-18] 18 (04/09 0250) ?BP: (132-158)/(58-72) 158/63 (04/09 0250) ?SpO2:  [99 %-100 %] 100 % (04/09 0250) ?Weight:  [62 kg] 62 kg (04/09 0250) ?Physical Exam: ?General: Elderly male, sitting up in chair next to bedside, NAD ?Cardiovascular: Irregularly irregular, no murmur appreciated ?Respiratory: Mildly decreased sounds in the bilateral bases, no rales or rhonchi, no increased work of breathing, on room air ?Abdomen: Soft, nontender, nondistended ?Extremities: 2-3+ edema in bilateral feet, full ROM of lower extremities ? ?Laboratory: ?Recent Labs  ?Lab 03/17/22 ?0807 03/18/22 ?0331 03/19/22 ?0506  ?WBC 6.9 8.3 6.3  ?HGB 13.6 15.0 12.6*  ?HCT 42.1 44.7 37.8*  ?PLT 160 175 134*  ? ?Recent Labs  ?Lab 03/17/22 ?1034 03/18/22 ?0331 03/20/22 ?0300 03/21/22 ?[id number] 03/22/22 ?[id number]  ?NA  --    < > 138 135 135  ?K  --    < > 4.7 4.0 4.6  ?CL  --    < > 105 102 98  ?CO2  --    < > 29 25 31   ?BUN  --    < > 40* 41* 41*  ?CREATININE  --    < > 2.08* 1.70* 1.71*  ?CALCIUM  --    < > 10.2 9.8 9.8  ?PROT 6.5  --   --   --   --   ?  BILITOT 1.2  --   --   --   --   ?ALKPHOS 93  --   --   --   --   ?ALT 24  --   --   --   --   ?AST 31  --   --   --   --   ?GLUCOSE  --    < > 97 82 89  ? < > = values in this interval not displayed.  ? ? ? ?Imaging/Diagnostic Tests: ?No results found.  ? ?Rise Patience, DO ?03/22/2022, 6:51 AM ?PGY-2, [place] ?[place] Intern pager: [phone], text pages welcome ? ?

## 2022-03-23 DIAGNOSIS — I340 Nonrheumatic mitral (valve) insufficiency: Secondary | ICD-10-CM | POA: Diagnosis not present

## 2022-03-23 DIAGNOSIS — I5082 Biventricular heart failure: Secondary | ICD-10-CM | POA: Diagnosis not present

## 2022-03-23 DIAGNOSIS — Z515 Encounter for palliative care: Secondary | ICD-10-CM | POA: Diagnosis not present

## 2022-03-23 DIAGNOSIS — Z7189 Other specified counseling: Secondary | ICD-10-CM | POA: Diagnosis not present

## 2022-03-23 DIAGNOSIS — I509 Heart failure, unspecified: Secondary | ICD-10-CM | POA: Diagnosis not present

## 2022-03-23 DIAGNOSIS — Z66 Do not resuscitate: Secondary | ICD-10-CM

## 2022-03-23 DIAGNOSIS — I272 Pulmonary hypertension, unspecified: Secondary | ICD-10-CM | POA: Diagnosis not present

## 2022-03-23 DIAGNOSIS — M7989 Other specified soft tissue disorders: Secondary | ICD-10-CM | POA: Diagnosis not present

## 2022-03-23 LAB — CBC
HCT: 38.7 % — ABNORMAL LOW (ref 39.0–52.0)
Hemoglobin: 13.5 g/dL (ref 13.0–17.0)
MCH: 35.7 pg — ABNORMAL HIGH (ref 26.0–34.0)
MCHC: 34.9 g/dL (ref 30.0–36.0)
MCV: 102.4 fL — ABNORMAL HIGH (ref 80.0–100.0)
Platelets: 151 10*3/uL (ref 150–400)
RBC: 3.78 MIL/uL — ABNORMAL LOW (ref 4.22–5.81)
RDW: 14.7 % (ref 11.5–15.5)
WBC: 7.7 10*3/uL (ref 4.0–10.5)
nRBC: 0 % (ref 0.0–0.2)

## 2022-03-23 LAB — BASIC METABOLIC PANEL
Anion gap: 7 (ref 5–15)
BUN: 41 mg/dL — ABNORMAL HIGH (ref 8–23)
CO2: 30 mmol/L (ref 22–32)
Calcium: 9.6 mg/dL (ref 8.9–10.3)
Chloride: 95 mmol/L — ABNORMAL LOW (ref 98–111)
Creatinine, Ser: 1.85 mg/dL — ABNORMAL HIGH (ref 0.61–1.24)
GFR, Estimated: 36 mL/min — ABNORMAL LOW (ref >60)
Glucose, Bld: 102 mg/dL — ABNORMAL HIGH (ref 70–99)
Potassium: 4.4 mmol/L (ref 3.5–5.1)
Sodium: 132 mmol/L — ABNORMAL LOW (ref 135–145)

## 2022-03-23 MED ORDER — ASPIRIN 81 MG PO CHEW
81.0000 mg | CHEWABLE_TABLET | ORAL | Status: AC
  Administered 2022-03-24: 81 mg via ORAL
  Filled 2022-03-23: qty 1

## 2022-03-23 MED ORDER — SODIUM CHLORIDE 0.9% FLUSH
3.0000 mL | INTRAVENOUS | Status: DC | PRN
Start: 2022-03-23 — End: 2022-03-24

## 2022-03-23 MED ORDER — SODIUM CHLORIDE 0.9% FLUSH
3.0000 mL | Freq: Two times a day (BID) | INTRAVENOUS | Status: DC
  Administered 2022-03-24 – 2022-03-25 (×2): 3 mL via INTRAVENOUS

## 2022-03-23 MED ORDER — FUROSEMIDE 10 MG/ML IJ SOLN
80.0000 mg | Freq: Two times a day (BID) | INTRAMUSCULAR | Status: DC
  Filled 2022-03-23: qty 8

## 2022-03-23 MED ORDER — SODIUM CHLORIDE 0.9 % IV SOLN
INTRAVENOUS | Status: DC
Start: 2022-03-24 — End: 2022-03-24

## 2022-03-23 MED ORDER — SODIUM CHLORIDE 0.9 % IV SOLN: INTRAVENOUS | Status: DC

## 2022-03-23 MED ORDER — SODIUM CHLORIDE 0.9 % IV SOLN: 250.0000 mL | INTRAVENOUS | Status: DC | PRN

## 2022-03-23 NOTE — Progress Notes (Signed)
PT Cancellation Note ? ?Patient Details ?Name: Kimmy Parish ?MRN: [id number] ?DOB: 04-Mar-1941 ? ? ?Cancelled Treatment:    Reason Eval/Treat Not Completed: Patient declined, no reason specified. Pt initially agreeable, then after organizing room, pt stating "I don't feel like it right now" I will plan to return later and attempt again as time/schedule allows.  ? ?Vickki Muff, PT, DPT  ? ?Acute Rehabilitation Department ?Pager #: [phone] - [phone] ? ? ?Ronnie Derby ?03/23/2022, 12:26 PM ?

## 2022-03-23 NOTE — Progress Notes (Addendum)
? ? Advanced Heart Failure Rounding Note ? ?PCP-Cardiologist: Shelva Majestic, MD  ? ?Subjective:   ? ?On Milrinone 0.125 + IV Lasix 80 mg bid  ? ?Diuresing well. 6.5L in UOP yesterday. Overall net negative 10.4L this admit. Wt down 13 lb. LEE much improved but still slightly edematous.  ? ?SCr improved initially, down from 2.08>>1.70>>1.71 w/ slight bump to 1.85 today. BUN stable at 41  ? ?Feels well. OOB sitting up in chair. No dyspnea. Has been ambulating w/ PT. Wife present at bedside.  ? ?Objective:   ?Weight Range: ?60.2 kg ?Body mass index is 24.29 kg/m?.  ? ?Vital Signs:   ?Temp:  [97.7 ?F (36.5 ?C)-98.6 ?F (37 ?C)] 97.8 ?F (36.6 ?C) (04/10 [id number]) ?Pulse Rate:  [70-86] 72 (04/10 0728) ?Resp:  [16-19] 18 (04/10 0728) ?BP: (96-155)/(59-77) 155/65 (04/10 0728) ?SpO2:  [96 %-100 %] 96 % (04/10 0428) ?Weight:  [60.2 kg] 60.2 kg (04/10 0428) ?Last BM Date : 03/20/22 ? ?Weight change: ?Filed Weights  ? 03/21/22 0349 03/22/22 0250 03/23/22 0428  ?Weight: 65.3 kg 62 kg 60.2 kg  ? ? ?Intake/Output:  ? ?Intake/Output Summary (Last 24 hours) at 03/23/2022 0944 ?Last data filed at 03/23/2022 0900 ?Gross per 24 hour  ?Intake 2280 ml  ?Output 5450 ml  ?Net -3170 ml  ?  ? ? ?Physical Exam  ?  ?General:  frail elderly male, sitting up in chair. No resp difficulty ?HEENT: Normal ?Neck: Supple. JVP not well visualized . Carotids 2+ bilat; no bruits. No lymphadenopathy or thyromegaly appreciated. ?Cor: PMI nondisplaced. Irregularly irregular rhythm. 3/6 MR/TR murmurs  ?Lungs: decreased BS at the bases bilaterally  ?Abdomen: Soft, nontender, nondistended. No hepatosplenomegaly. No bruits or masses. Good bowel sounds. ?Extremities: No cyanosis, clubbing, rash, 1-2+ b/l LE edema (much improved) + TED hoses  ?Neuro: Alert & orientedx3, cranial nerves grossly intact. moves all 4 extremities w/o difficulty. Affect pleasant ? ? ?Telemetry  ? ?Chronic Afib, 70s, personally reviewed  ? ?EKG  ?  ?No new EKG to review  ? ?Labs  ?   ?CBC ?Recent Labs  ?  03/23/22 ?0256  ?WBC 7.7  ?HGB 13.5  ?HCT 38.7*  ?MCV 102.4*  ?PLT 151  ? ?Basic Metabolic Panel ?Recent Labs  ?  03/22/22 ?[id number] 03/23/22 ?0256  ?NA 135 132*  ?K 4.6 4.4  ?CL 98 95*  ?CO2 31 30  ?GLUCOSE 89 102*  ?BUN 41* 41*  ?CREATININE 1.71* 1.85*  ?CALCIUM 9.8 9.6  ?MG 2.1  --   ? ?Liver Function Tests ?No results for input(s): AST, ALT, ALKPHOS, BILITOT, PROT, ALBUMIN in the last 72 hours. ?No results for input(s): LIPASE, AMYLASE in the last 72 hours. ?Cardiac Enzymes ?No results for input(s): CKTOTAL, CKMB, CKMBINDEX, TROPONINI in the last 72 hours. ? ?BNP: ?BNP (last 3 results) ?Recent Labs  ?  03/17/22 ?1034  ?BNP 714.2*  ? ? ?ProBNP (last 3 results) ?No results for input(s): PROBNP in the last 8760 hours. ? ? ?D-Dimer ?No results for input(s): DDIMER in the last 72 hours. ?Hemoglobin A1C ?No results for input(s): HGBA1C in the last 72 hours. ?Fasting Lipid Panel ?No results for input(s): CHOL, HDL, LDLCALC, TRIG, CHOLHDL, LDLDIRECT in the last 72 hours. ?Thyroid Function Tests ?No results for input(s): TSH, T4TOTAL, T3FREE, THYROIDAB in the last 72 hours. ? ?Invalid input(s): FREET3 ? ?Other results: ? ? ?Imaging  ? ? ?No results found. ? ? ?Medications:   ? ? ?Scheduled Medications: ? aspirin  81 mg Oral Daily  ?  atorvastatin  80 mg Oral q1800  ? clopidogrel  75 mg Oral Daily  ? enoxaparin (LOVENOX) injection  30 mg Subcutaneous Q24H  ? furosemide  80 mg Intravenous BID  ? multivitamin with minerals  1 tablet Oral Daily  ? potassium chloride  40 mEq Oral BID  ? ? ?Infusions: ? milrinone 0.125 mcg/kg/min (03/21/22 1320)  ? ? ?PRN Medications: ?acetaminophen **OR** acetaminophen, lidocaine ? ? ? ?Patient Profile  ? ?81 y/o male w/ h/o chronic systolic heart failure due to ischemic CM, CAD s/p CABG x 4, permanent atrial fibrillation, mitral regurgitation, HTN, HLD, PAD and Stage III CKD, admitted w/ a/c biventricular heart failure w/ progressive NYHA Class IIIb symptoms and marked  fluid overload + severe MR and TR, c/b worsening renal function. Started on empiric milrinone to argument diuresis.  ? ?Assessment/Plan  ? ?Acute on Chronic Biventricular Heart Failure ?- Ischemic CM, severe MVCAD s/p CABG in 2017 ?- Echo 2017 EF 25-30%, RV normal  ?- Echo 2018 EF 20-25%, RV mildly reduced ?- Echo 2021 EF improved 45-50%, RV normal  ?- Echo this admit EF 30-35%, diffuse HK, RV moderately reduced  ?- Cause of drop in EF uncertain as he had been lost to f/u. ? New coronary ischemia, though no recent CP. HS trend not c/w ACS. ? Tachy mediated. Has chronic afib but uncertain how well rate controlled he has been ?- Admitted w/ NYHA Class IIIb w/ marked fluid overload c/b worsening renal function w/ diuresis concerning for low output. Started on empiric milrinone 0.125 to augment diuresis  ?- remains on milrinone. Diuresing well w/ IV Lasix. Wt down 13 lb. Still w/ edema though much improved ?- continue IV Lasix x 1 more day, likely switch to PO diuretics tomorrow ?- continue milrinone until fully diuresed  ?- c/w gentle LE compression w/ TED hoses, avoiding UNNA boots w/ PAD  ?- off coreg w/ low output  ?- GDMT currently limited by elevated SCr.  May need hydral/nitrate once off milrinone   ?- Will need RHC once better diuresed +/- LHC if renal function improves  ?  ?2. Severe Mitral Regurgitation  ?- severe on echo w/ posterior leaflet tethering ?- suspect mostly functional, in the setting of severe dilated CM + chronic afib  ?- continue to optimize from CHF standpoint (see above) ?- will ask structural heart team to evaluate for possible MitraClip but not sure he is ideal candidate  ?  ?3. Severe Tricuspid Regurgitation  ?- functional, from dilated RV ?- HF optimization per above  ?  ?4. CAD ?- s/p CABG x 4 in 2017 (LIMA to LAD, SVG to OM, SVG to PDA, and SVG to diagonal) ?- denies recent CP ?- continue medical therapy, on asa + statin  ?  ?5. Permanent Atrial Fibrillation  ?- Chronic since 2017,  failed cardioversion ?- LAA clip at time of CABG in 2017 ?- no longer on a/c given h/o frequent nose bleeds ?- holding ? blocker w/ low output  ?- low threshold to add amiodarone gtt while on milrinone (v-rates currently stable)  ?  ?6. AKI on Stage IIIa ?- last baseline SCr ~1.2 ?- SCr 1.40 on admit, worsening w/ attempts at diuresis, up to 2.08 today  ?- suspect cardiorenal, low output from Biv failure and severe MR + renal vein congestion from fluid overload ?- improved w/ empiric milrinone + diuresis  ?- Scr 1.4>1.5>1.8>1.9>2.1>1.7>1.9  ?- continue to monitor  ?  ?7. Pleural Effusion  ?- s/p Rt Thoracentesis 4/4  w/ 1.3L fluid removal  ?  ?8. PAD ?- h/o severe Caroid artery stenosis, s/p left CEA ?- LE PAD w/ evidence of multilevel arterial occlusive disease ?- reduced ABI on rt, c/w moderate disease + LE wound ?- Seen by Dr. Scot Dock this admit, he will follow outpatient  ? ? ?Length of Stay: 6 ? ?Lyda Jester, PA-C  ?03/23/2022, 9:44 AM ? ?Advanced Heart Failure Team ?Pager [phone] (M-F; 7a - 5p)  ?Please contact [facility] for night-coverage after hours (5p -7a ) and weekends on [email or web address] ? ?Patient seen and examined with the above-signed Advanced Practice Provider and/or Housestaff. I personally reviewed laboratory data, imaging studies and relevant notes. I independently examined the patient and formulated the important aspects of the plan. I have edited the note to reflect any of my changes or salient points. I have personally discussed the plan with the patient and/or family. ? ?Looks much better on milrinone. Breathing better. No orthopnea or PND. SCr improved but now trending back up.  ? ?General:  Sitting in chair No resp difficulty ?HEENT: normal ?Neck: supple. no JVD. Carotids 2+ bilat; no bruits. No lymphadenopathy or thryomegaly appreciated. ?Cor: PMI nondisplaced. Irregular rate & rhythm. 2/6 MR ?Lungs: clear ?Abdomen: soft, nontender, nondistended. No hepatosplenomegaly. No bruits or  masses. Good bowel sounds. ?Extremities: no cyanosis, clubbing, rash, edema ?Neuro: alert & orientedx3, cranial nerves grossly intact. moves all 4 extremities w/o difficulty. Affect pleasant ? ?Much improved with milrinone th

## 2022-03-23 NOTE — Care Management Important Message (Signed)
Important Message ? ?Patient Details  ?Name: [name] [name] ?MRN: [id number] ?Date of Birth: [birth year] ? ? ?Medicare Important Message Given:  Yes ? ? ? ? ?[name] ?03/23/2022, 8:52 AM ?

## 2022-03-23 NOTE — Progress Notes (Signed)
Occupational Therapy Treatment ?Patient Details ?Name: [name] [name] ?MRN: [id number] ?DOB: 1941/02/14 ?Today's Date: 03/23/2022 ? ? ?History of present illness Pt is an 81 y.o. male who presented 03/17/22 with bil lower extremity swelling and pain. Pt with heart failure exacerbation and pleural effusions. He has evidence of multilevel arterial occlusive disease in bil legs. S/p R thoracentesis 4/4. PMH: HFmrEF, CAD, s/p CABG x4, PAD, Afib ?  ?OT comments ? Patient up in recliner asking to go to bathroom. Patient able to ambulate to bathroom with IV pole and min guard assist. Patient able to perform toilet hygiene with supervision while seated and hand and face hygiene while standing at sink with min guard assist for safety. Patient making good gains with OT and will continue to be followed in acute setting.   ? ?Recommendations for follow up therapy are one component of a multi-disciplinary discharge planning process, led by the attending physician.  Recommendations may be updated based on patient status, additional functional criteria and insurance authorization. ?   ?Follow Up Recommendations ? No OT follow up  ?  ?Assistance Recommended at Discharge Intermittent Supervision/Assistance  ?Patient can return home with the following ? A little help with bathing/dressing/bathroom;Assistance with cooking/housework;A little help with walking and/or transfers ?  ?Equipment Recommendations ? Tub/shower seat  ?  ?Recommendations for Other Services   ? ?  ?Precautions / Restrictions Precautions ?Precautions: Fall ?Precaution Comments: elevate BLE ?Restrictions ?Weight Bearing Restrictions: No  ? ? ?  ? ?Mobility Bed Mobility ?Overal bed mobility: Needs Assistance ?  ?  ?  ?  ?  ?  ?General bed mobility comments: OOB in recliner at start and end of session ?  ? ?Transfers ?Overall transfer level: Needs assistance ?Equipment used: None ?Transfers: Sit to/from Stand ?Sit to Stand: Min guard ?  ?  ?  ?  ?  ?General transfer comment:  min guard for safety with mobility and transfer ?  ?  ?Balance Overall balance assessment: Needs assistance ?Sitting-balance support: No upper extremity supported, Feet supported ?Sitting balance-Leahy Scale: Good ?  ?  ?Standing balance support: Single extremity supported, During functional activity ?Standing balance-Leahy Scale: Fair ?Standing balance comment: gait with single UE support on IV pole ?  ?  ?  ?  ?  ?  ?  ?  ?  ?  ?  ?   ? ?ADL either performed or assessed with clinical judgement  ? ?ADL Overall ADL's : Needs assistance/impaired ?  ?  ?Grooming: Wash/dry hands;Wash/dry face;Min guard;Standing ?Grooming Details (indicate cue type and reason): at sink ?  ?  ?  ?  ?  ?  ?  ?  ?Toilet Transfer: Min guard;Ambulation;Regular Toilet ?Toilet Transfer Details (indicate cue type and reason): no assistive device to ambulate to toilet ?Toileting- Clothing Manipulation and Hygiene: Supervision/safety;Sitting/lateral lean ?Toileting - Clothing Manipulation Details (indicate cue type and reason): performed toilet hygiene seated ?  ?  ?  ?General ADL Comments: cues for safety ?  ? ?Extremity/Trunk Assessment   ?  ?  ?  ?  ?  ? ?Vision   ?  ?  ?Perception   ?  ?Praxis   ?  ? ?Cognition Arousal/Alertness: Awake/alert ?Behavior During Therapy: [facility] for tasks assessed/performed, Flat affect, Impulsive ?Overall Cognitive Status: No family/caregiver present to determine baseline cognitive functioning ?  ?  ?  ?  ?  ?  ?  ?  ?  ?  ?  ?  ?  ?  ?  ?  ?  ?  ?  ?   ?  Exercises   ? ?  ?Shoulder Instructions   ? ? ?  ?General Comments    ? ? ?Pertinent Vitals/ Pain       Pain Assessment ?Pain Assessment: Faces ?Faces Pain Scale: Hurts a little bit ?Pain Location: BLE with movement ?Pain Descriptors / Indicators: Grimacing ?Pain Intervention(s): Monitored during session, Repositioned ? ?Home Living   ?  ?  ?  ?  ?  ?  ?  ?  ?  ?  ?  ?  ?  ?  ?  ?  ?  ?  ? ?  ?Prior Functioning/Environment    ?  ?  ?  ?   ? ?Frequency ? Min 2X/week   ? ? ? ? ?  ?Progress Toward Goals ? ?OT Goals(current goals can now be found in the care plan section) ? Progress towards OT goals: Progressing toward goals ? ?Acute Rehab OT Goals ?Patient Stated Goal: go home ?OT Goal Formulation: With patient ?Time For Goal Achievement: 04/01/22 ?Potential to Achieve Goals: Good ?ADL Goals ?Pt Will Perform Lower Body Bathing: with modified independence;with set-up;with adaptive equipment;sitting/lateral leans;sit to/from stand ?Pt Will Perform Lower Body Dressing: with modified independence;with set-up;with adaptive equipment;sitting/lateral leans;sit to/from stand ?Pt Will Transfer to Toilet: with modified independence;ambulating ?Pt Will Perform Toileting - Clothing Manipulation and hygiene: with modified independence;sitting/lateral leans;sit to/from stand ?Pt Will Perform Tub/Shower Transfer: with supervision;Tub transfer;ambulating;rolling walker;shower seat ?Additional ADL Goal #1: Pt will independently state 1-2 energy conservation techniques and implement during ADL session (issue/review handout).  ?Plan Discharge plan remains appropriate   ? ?Co-evaluation ? ? ?   ?  ?  ?  ?  ? ?  ?AM-PAC OT "6 Clicks" Daily Activity     ?Outcome Measure ? ? Help from another person eating meals?: None ?Help from another person taking care of personal grooming?: A Little ?Help from another person toileting, which includes using toliet, bedpan, or urinal?: A Little ?Help from another person bathing (including washing, rinsing, drying)?: A Little ?Help from another person to put on and taking off regular upper body clothing?: None ?Help from another person to put on and taking off regular lower body clothing?: A Little ?6 Click Score: 20 ? ?  ?End of Session Equipment Utilized During Treatment: Gait belt ? ?OT Visit Diagnosis: Muscle weakness (generalized) (M62.81);Unsteadiness on feet (R26.81) ?  ?Activity Tolerance Patient tolerated treatment well ?  ?Patient Left in chair;with call  bell/phone within reach;with chair alarm set ?  ?Nurse Communication Mobility status ?  ? ?   ? ?Time: [id number] ?OT Time Calculation (min): 19 min ? ?Charges: OT General Charges ?$OT Visit: 1 Visit ?OT Treatments ?$Self Care/Home Management : 8-22 mins ? ?Lodema Hong, OTA ?Acute Rehabilitation Services  ?Pager [phone] ?Office [phone] ? ? ?[place] ?03/23/2022, 11:08 AM ?

## 2022-03-23 NOTE — Progress Notes (Addendum)
Family Medicine Teaching Service ?Daily Progress Note ?Intern Pager: [phone] ? ?Patient name: [name] [name] Medical record number: [id number] ?Date of birth: 1940/12/30 Age: 81 y.o. Gender: male ? ?Primary Care Provider: Orpah Melter, MD ?Consultants: Cardiology, HF, IR, VVS ?Code Status: ? ? ?Pt Overview and Major Events to Date:  ?4/4-admitted, right thoracentesis ? ?Assessment and Plan: ?[name] [name] is a 81 year old male presenting with acute exacerbation of HFrEF, diuresing.  Past medical history significant for HFmrEF, CAD on aspirin and Plavix status post CABG x4, PAD, A-fib, hypertension, hyperlipidemia, CKD stage III ? ?Decompensated biventricular heart failure  HFrEF ?UOP 3.375 L, net output 1.9 L, weight down from 60.2 kg to 59.4.  ?-Cardiology/heart failure team following, appreciate recommendations ?-Plan for left heart/right heart cath this hospitalization ?-Continue milrinone ?-Transition oral Lasix 80 mg bid ?-Strict I's/O with daily weights ?-Continue TED hose ?-Decrease K repletion to 40 meq daily ? ?Acute on chronic renal insufficiency ?Creatinine 1.85 > 1.60. Baseline Cr ~1.4 ?-AM BMP ? ?Severe PAD ?CAD s/p CABG x4 ?Right carotid artery occlusion ?-Continue ASA+Plavix ?-Elevated BLE with 1-2 pillows ?-Outpatient VVS follow-up ? ?Permanent A-fib, history of LAA clipping ?-Rate controlled ?-Continue home aspirin and Plavix ?-Not on anticoagulation per patient preference ? ?Goals of care ?Patient continues to have poor insight regarding medication and current medical condition.  Patient agreeable to palliative care consult. ?-Palliative care consult, appreciate recommendaitons ? ?Hyperlipidemia ?-Continue home lipitor 80 mg daily ? ?Hypertension ?BP 117/50 ?-Holding home ARB, Aldactone, Coreg ? ?FEN/GI: Heart Healthy ?PPx: Lovenox ?Dispo:Home in 3 or more days. Barriers include ongoing cardiac workup and diuresis.  ? ?Subjective:  ?Seen and assessed at bedside. Unchanged from yesterday. Amenable to  RHC/TEE today.  ? ?Objective: ?Temp:  [97.8 ?F (36.6 ?C)-98.6 ?F (37 ?C)] 97.8 ?F (36.6 ?C) (04/10 [id number]) ?Pulse Rate:  [70-76] 72 (04/10 0728) ?Resp:  [16-19] 18 (04/10 0728) ?BP: (96-155)/(59-65) 155/65 (04/10 0728) ?SpO2:  [96 %] 96 % (04/10 0428) ?Weight:  [60.2 kg] 60.2 kg (04/10 0428) ?Physical Exam: ?General: resting comfortably ?Cardiovascular: RRR ?Respiratory: CTAB ?Abdomen: soft, nontender ?Extremities: warm, dry. Mild edema in left foot, improving BLE pitting edema ? ?Laboratory: ?Recent Labs  ?Lab 03/18/22 ?0331 03/19/22 ?0506 03/23/22 ?0256  ?WBC 8.3 6.3 7.7  ?HGB 15.0 12.6* 13.5  ?HCT 44.7 37.8* 38.7*  ?PLT 175 134* 151  ? ?Recent Labs  ?Lab 03/17/22 ?1034 03/18/22 ?0331 03/21/22 ?[id number] 03/22/22 ?[id number] 03/23/22 ?0256  ?NA  --    < > 135 135 132*  ?K  --    < > 4.0 4.6 4.4  ?CL  --    < > 102 98 95*  ?CO2  --    < > 25 31 30   ?BUN  --    < > 41* 41* 41*  ?CREATININE  --    < > 1.70* 1.71* 1.85*  ?CALCIUM  --    < > 9.8 9.8 9.6  ?PROT 6.5  --   --   --   --   ?BILITOT 1.2  --   --   --   --   ?ALKPHOS 93  --   --   --   --   ?ALT 24  --   --   --   --   ?AST 31  --   --   --   --   ?GLUCOSE  --    < > 82 89 102*  ? < > = values in this interval not displayed.  ? ? ? ? ?  Imaging/Diagnostic Tests: ?No results found. ? ?France Ravens, MD ?03/23/2022, 3:29 PM ?PGY-1, [place] ?[place] Intern pager: [phone], text pages welcome ?

## 2022-03-23 NOTE — Progress Notes (Signed)
FPTS Night Rounding Progress Note ? ?S:Went to bedside to check on patient, he was resting comfortably. I did not wake the patient.  ? ?O: ?BP (!) 96/59   Pulse 76   Temp 98.4 ?F (36.9 ?C) (Oral)   Resp 16   Ht 5\' 2"  (1.575 m)   Wt 62 kg   SpO2 96%   BMI 24.98 kg/m?   ?General: Patient resting comfortably, in no acute distress.  ? ?A/P: ?Vitals and orders reviewed. UOP 3.66L, will continue with lasix 80 bid. Cardiology following, appreciate continued involvement and recommendations. Remainder of plan per day team.  ? ? , DO ?03/23/2022, 1:20 AM ?PGY-2, [facility] ?Service pager [phone]  ?

## 2022-03-23 NOTE — Progress Notes (Signed)
Physical Therapy Treatment ?Patient Details ?Name: [name] [name] ?MRN: [id number] ?DOB: July 11, 1941 ?Today's Date: 03/23/2022 ? ? ?History of Present Illness Pt is an 81 y.o. male who presented 03/17/22 with bil lower extremity swelling and pain. Pt with heart failure exacerbation and pleural effusions. He has evidence of multilevel arterial occlusive disease in bil legs. S/p R thoracentesis 4/4. PMH: HFmrEF, CAD, s/p CABG x4, PAD, Afib ? ?  ?PT Comments  ? ? The pt was agreeable to session, able to ambulate in room and hallway without UE support. He continues to mobilize with slowed speed and at times drifting to the R with need for increased time to correct balance. The pt was able to demo good understanding of some LE exercises, declined further mobility or exercise after walking in hallway. Will continue to benefit from skilled PT acutely for balance and endurance, and HHPT to improve independence and safety at home.  ?   ?Recommendations for follow up therapy are one component of a multi-disciplinary discharge planning process, led by the attending physician.  Recommendations may be updated based on patient status, additional functional criteria and insurance authorization. ? ?Follow Up Recommendations ? Home health PT ?  ?  ?Assistance Recommended at Discharge PRN  ?Patient can return home with the following Help with stairs or ramp for entrance;Assistance with cooking/housework;A little help with bathing/dressing/bathroom ?  ?Equipment Recommendations ? None recommended by PT  ?  ?Recommendations for Other Services   ? ? ?  ?Precautions / Restrictions Precautions ?Precautions: Fall ?Precaution Comments: elevate BLE ?Restrictions ?Weight Bearing Restrictions: No  ?  ? ?Mobility ? Bed Mobility ?Overal bed mobility: Needs Assistance ?  ?  ?  ?  ?  ?  ?General bed mobility comments: OOB in recliner at start and end of session ?  ? ?Transfers ?Overall transfer level: Needs assistance ?Equipment used: None ?Transfers: Sit  to/from Stand ?Sit to Stand: Min guard ?  ?  ?  ?  ?  ?General transfer comment: min guard for safety with mobility and transfer ?  ? ?Ambulation/Gait ?Ambulation/Gait assistance: Min guard ?Gait Distance (Feet): 150 Feet ?Assistive device: None ?Gait Pattern/deviations: Step-through pattern, Decreased stride length, Trunk flexed ?Gait velocity: 0.5 m/s ?Gait velocity interpretation: 1.31 - 2.62 ft/sec, indicative of limited community ambulator ?  ?General Gait Details: slow gait with intermittent drifting to R with increased time to correct balance. ? ? ? ?  ?Balance Overall balance assessment: Needs assistance ?Sitting-balance support: No upper extremity supported, Feet supported ?Sitting balance-Leahy Scale: Good ?  ?  ?Standing balance support: No upper extremity supported, During functional activity ?Standing balance-Leahy Scale: Fair ?Standing balance comment: no UE support for gait but unsteady and unable to tolerate challenge ?  ?  ?  ?  ?  ?  ?  ?  ?  ?  ?  ?  ? ?  ?Cognition Arousal/Alertness: Awake/alert ?Behavior During Therapy: [facility] for tasks assessed/performed, Flat affect, Impulsive ?Overall Cognitive Status: No family/caregiver present to determine baseline cognitive functioning ?  ?  ?  ?  ?  ?  ?  ?  ?  ?  ?  ?  ?  ?  ?  ?  ?General Comments: pt following cues with slightly increased time, able to express needs. seems to have slight decreased awareness to incontinence ?  ?  ? ?  ?Exercises General Exercises - Lower Extremity ?Ankle Circles/Pumps: AROM, Both, 10 reps, Seated ?Long Arc Quad: AROM, Both, 10 reps, Seated ? ?  ?  General Comments General comments (skin integrity, edema, etc.): VSS on RA ?  ?  ? ?Pertinent Vitals/Pain Pain Assessment ?Pain Assessment: Faces ?Faces Pain Scale: Hurts a little bit ?Pain Location: BLE with movement ?Pain Descriptors / Indicators: Grimacing ?Pain Intervention(s): Limited activity within patient's tolerance, Monitored during session, Repositioned  ? ? ? ?PT Goals  (current goals can now be found in the care plan section) Acute Rehab PT Goals ?Patient Stated Goal: to get better and go home ?PT Goal Formulation: With patient ?Time For Goal Achievement: 04/01/22 ?Potential to Achieve Goals: Good ?Progress towards PT goals: Progressing toward goals ? ?  ?Frequency ? ? ? Min 3X/week ? ? ? ?  ?PT Plan Current plan remains appropriate  ? ? ?   ?AM-PAC PT "6 Clicks" Mobility   ?Outcome Measure ? Help needed turning from your back to your side while in a flat bed without using bedrails?: A Little ?Help needed moving from lying on your back to sitting on the side of a flat bed without using bedrails?: A Little ?Help needed moving to and from a bed to a chair (including a wheelchair)?: A Little ?Help needed standing up from a chair using your arms (e.g., wheelchair or bedside chair)?: A Little ?Help needed to walk in hospital room?: A Little ?Help needed climbing 3-5 steps with a railing? : A Little ?6 Click Score: 18 ? ?  ?End of Session Equipment Utilized During Treatment: Gait belt ?Activity Tolerance: Patient tolerated treatment well ?Patient left: in chair;with call bell/phone within reach ?Nurse Communication: Mobility status ?PT Visit Diagnosis: Unsteadiness on feet (R26.81);Other abnormalities of gait and mobility (R26.89);Muscle weakness (generalized) (M62.81);Difficulty in walking, not elsewhere classified (R26.2) ?  ? ? ?Time: 3664-4034 ?PT Time Calculation (min) (ACUTE ONLY): 19 min ? ?Charges:  $Therapeutic Exercise: 8-22 mins          ?          ? ?Vickki Muff, PT, DPT  ? ?Acute Rehabilitation Department ?Pager #: [phone] ? ? ?[name] [name] ?03/23/2022, 3:54 PM ? ?

## 2022-03-23 NOTE — H&P (View-Only) (Signed)
? ? Advanced Heart Failure Rounding Note ? ?PCP-Cardiologist: Shelva Majestic, MD  ? ?Subjective:   ? ?On Milrinone 0.125 + IV Lasix 80 mg bid  ? ?Diuresing well. 6.5L in UOP yesterday. Overall net negative 10.4L this admit. Wt down 13 lb. LEE much improved but still slightly edematous.  ? ?SCr improved initially, down from 2.08>>1.70>>1.71 w/ slight bump to 1.85 today. BUN stable at 41  ? ?Feels well. OOB sitting up in chair. No dyspnea. Has been ambulating w/ PT. Wife present at bedside.  ? ?Objective:   ?Weight Range: ?60.2 kg ?Body mass index is 24.29 kg/m?.  ? ?Vital Signs:   ?Temp:  [97.7 ?F (36.5 ?C)-98.6 ?F (37 ?C)] 97.8 ?F (36.6 ?C) (04/10 [id number]) ?Pulse Rate:  [70-86] 72 (04/10 0728) ?Resp:  [16-19] 18 (04/10 0728) ?BP: (96-155)/(59-77) 155/65 (04/10 0728) ?SpO2:  [96 %-100 %] 96 % (04/10 0428) ?Weight:  [60.2 kg] 60.2 kg (04/10 0428) ?Last BM Date : 03/20/22 ? ?Weight change: ?Filed Weights  ? 03/21/22 0349 03/22/22 0250 03/23/22 0428  ?Weight: 65.3 kg 62 kg 60.2 kg  ? ? ?Intake/Output:  ? ?Intake/Output Summary (Last 24 hours) at 03/23/2022 0944 ?Last data filed at 03/23/2022 0900 ?Gross per 24 hour  ?Intake 2280 ml  ?Output 5450 ml  ?Net -3170 ml  ?  ? ? ?Physical Exam  ?  ?General:  frail elderly male, sitting up in chair. No resp difficulty ?HEENT: Normal ?Neck: Supple. JVP not well visualized . Carotids 2+ bilat; no bruits. No lymphadenopathy or thyromegaly appreciated. ?Cor: PMI nondisplaced. Irregularly irregular rhythm. 3/6 MR/TR murmurs  ?Lungs: decreased BS at the bases bilaterally  ?Abdomen: Soft, nontender, nondistended. No hepatosplenomegaly. No bruits or masses. Good bowel sounds. ?Extremities: No cyanosis, clubbing, rash, 1-2+ b/l LE edema (much improved) + TED hoses  ?Neuro: Alert & orientedx3, cranial nerves grossly intact. moves all 4 extremities w/o difficulty. Affect pleasant ? ? ?Telemetry  ? ?Chronic Afib, 70s, personally reviewed  ? ?EKG  ?  ?No new EKG to review  ? ?Labs  ?   ?CBC ?Recent Labs  ?  03/23/22 ?0256  ?WBC 7.7  ?HGB 13.5  ?HCT 38.7*  ?MCV 102.4*  ?PLT 151  ? ?Basic Metabolic Panel ?Recent Labs  ?  03/22/22 ?[id number] 03/23/22 ?0256  ?NA 135 132*  ?K 4.6 4.4  ?CL 98 95*  ?CO2 31 30  ?GLUCOSE 89 102*  ?BUN 41* 41*  ?CREATININE 1.71* 1.85*  ?CALCIUM 9.8 9.6  ?MG 2.1  --   ? ?Liver Function Tests ?No results for input(s): AST, ALT, ALKPHOS, BILITOT, PROT, ALBUMIN in the last 72 hours. ?No results for input(s): LIPASE, AMYLASE in the last 72 hours. ?Cardiac Enzymes ?No results for input(s): CKTOTAL, CKMB, CKMBINDEX, TROPONINI in the last 72 hours. ? ?BNP: ?BNP (last 3 results) ?Recent Labs  ?  03/17/22 ?1034  ?BNP 714.2*  ? ? ?ProBNP (last 3 results) ?No results for input(s): PROBNP in the last 8760 hours. ? ? ?D-Dimer ?No results for input(s): DDIMER in the last 72 hours. ?Hemoglobin A1C ?No results for input(s): HGBA1C in the last 72 hours. ?Fasting Lipid Panel ?No results for input(s): CHOL, HDL, LDLCALC, TRIG, CHOLHDL, LDLDIRECT in the last 72 hours. ?Thyroid Function Tests ?No results for input(s): TSH, T4TOTAL, T3FREE, THYROIDAB in the last 72 hours. ? ?Invalid input(s): FREET3 ? ?Other results: ? ? ?Imaging  ? ? ?No results found. ? ? ?Medications:   ? ? ?Scheduled Medications: ? aspirin  81 mg Oral Daily  ?  atorvastatin  80 mg Oral q1800  ? clopidogrel  75 mg Oral Daily  ? enoxaparin (LOVENOX) injection  30 mg Subcutaneous Q24H  ? furosemide  80 mg Intravenous BID  ? multivitamin with minerals  1 tablet Oral Daily  ? potassium chloride  40 mEq Oral BID  ? ? ?Infusions: ? milrinone 0.125 mcg/kg/min (03/21/22 1320)  ? ? ?PRN Medications: ?acetaminophen **OR** acetaminophen, lidocaine ? ? ? ?Patient Profile  ? ?81 y/o male w/ h/o chronic systolic heart failure due to ischemic CM, CAD s/p CABG x 4, permanent atrial fibrillation, mitral regurgitation, HTN, HLD, PAD and Stage III CKD, admitted w/ a/c biventricular heart failure w/ progressive NYHA Class IIIb symptoms and marked  fluid overload + severe MR and TR, c/b worsening renal function. Started on empiric milrinone to argument diuresis.  ? ?Assessment/Plan  ? ?Acute on Chronic Biventricular Heart Failure ?- Ischemic CM, severe MVCAD s/p CABG in 2017 ?- Echo 2017 EF 25-30%, RV normal  ?- Echo 2018 EF 20-25%, RV mildly reduced ?- Echo 2021 EF improved 45-50%, RV normal  ?- Echo this admit EF 30-35%, diffuse HK, RV moderately reduced  ?- Cause of drop in EF uncertain as he had been lost to f/u. ? New coronary ischemia, though no recent CP. HS trend not c/w ACS. ? Tachy mediated. Has chronic afib but uncertain how well rate controlled he has been ?- Admitted w/ NYHA Class IIIb w/ marked fluid overload c/b worsening renal function w/ diuresis concerning for low output. Started on empiric milrinone 0.125 to augment diuresis  ?- remains on milrinone. Diuresing well w/ IV Lasix. Wt down 13 lb. Still w/ edema though much improved ?- continue IV Lasix x 1 more day, likely switch to PO diuretics tomorrow ?- continue milrinone until fully diuresed  ?- c/w gentle LE compression w/ TED hoses, avoiding UNNA boots w/ PAD  ?- off coreg w/ low output  ?- GDMT currently limited by elevated SCr.  May need hydral/nitrate once off milrinone   ?- Will need RHC once better diuresed +/- LHC if renal function improves  ?  ?2. Severe Mitral Regurgitation  ?- severe on echo w/ posterior leaflet tethering ?- suspect mostly functional, in the setting of severe dilated CM + chronic afib  ?- continue to optimize from CHF standpoint (see above) ?- will ask structural heart team to evaluate for possible MitraClip but not sure he is ideal candidate  ?  ?3. Severe Tricuspid Regurgitation  ?- functional, from dilated RV ?- HF optimization per above  ?  ?4. CAD ?- s/p CABG x 4 in 2017 (LIMA to LAD, SVG to OM, SVG to PDA, and SVG to diagonal) ?- denies recent CP ?- continue medical therapy, on asa + statin  ?  ?5. Permanent Atrial Fibrillation  ?- Chronic since 2017,  failed cardioversion ?- LAA clip at time of CABG in 2017 ?- no longer on a/c given h/o frequent nose bleeds ?- holding ? blocker w/ low output  ?- low threshold to add amiodarone gtt while on milrinone (v-rates currently stable)  ?  ?6. AKI on Stage IIIa ?- last baseline SCr ~1.2 ?- SCr 1.40 on admit, worsening w/ attempts at diuresis, up to 2.08 today  ?- suspect cardiorenal, low output from Biv failure and severe MR + renal vein congestion from fluid overload ?- improved w/ empiric milrinone + diuresis  ?- Scr 1.4>1.5>1.8>1.9>2.1>1.7>1.9  ?- continue to monitor  ?  ?7. Pleural Effusion  ?- s/p Rt Thoracentesis 4/4  w/ 1.3L fluid removal  ?  ?8. PAD ?- h/o severe Caroid artery stenosis, s/p left CEA ?- LE PAD w/ evidence of multilevel arterial occlusive disease ?- reduced ABI on rt, c/w moderate disease + LE wound ?- Seen by Dr. Scot Dock this admit, he will follow outpatient  ? ? ?Length of Stay: 6 ? ?Lyda Jester, PA-C  ?03/23/2022, 9:44 AM ? ?Advanced Heart Failure Team ?Pager [phone] (M-F; 7a - 5p)  ?Please contact [facility] for night-coverage after hours (5p -7a ) and weekends on [email or web address] ? ?Patient seen and examined with the above-signed Advanced Practice Provider and/or Housestaff. I personally reviewed laboratory data, imaging studies and relevant notes. I independently examined the patient and formulated the important aspects of the plan. I have edited the note to reflect any of my changes or salient points. I have personally discussed the plan with the patient and/or family. ? ?Looks much better on milrinone. Breathing better. No orthopnea or PND. SCr improved but now trending back up.  ? ?General:  Sitting in chair No resp difficulty ?HEENT: normal ?Neck: supple. no JVD. Carotids 2+ bilat; no bruits. No lymphadenopathy or thryomegaly appreciated. ?Cor: PMI nondisplaced. Irregular rate & rhythm. 2/6 MR ?Lungs: clear ?Abdomen: soft, nontender, nondistended. No hepatosplenomegaly. No bruits or  masses. Good bowel sounds. ?Extremities: no cyanosis, clubbing, rash, edema ?Neuro: alert & orientedx3, cranial nerves grossly intact. moves all 4 extremities w/o difficulty. Affect pleasant ? ?Much improved with milrinone th

## 2022-03-23 NOTE — Consult Note (Signed)
? ?Palliative Care Consult Note  ?                                ?Date: 03/23/2022  ? ?Patient Name: [name] [name]  ?DOB: 1941/05/19  MRN: [id number]  Age / Sex: 81 y.o., male  ?PCP: Orpah Melter, MD ?Referring Physician: Martyn Malay, MD ? ?Reason for Consultation: Establishing goals of care ? ?HPI/Patient Profile: 81 y.o. male  with past medical history of chronic systolic heart failure due to ischemic cardiomyopathy, CAD status post CABG in 2017, stage III CKD, permanent atrial fibrillation mitral regurgitation, peripheral artery disease, hypertension and hyperlipidemia who presented to the emergency department on 03/17/2022 with complaints of gradually worsening lower extremity edema over the last 2 to 3 months and progressive shortness of breath.  In the ED he was found to be markedly fluid overloaded.  He was admitted to family practice teaching service with acute on chronic heart failure. ? ?Past Medical History:  ?Diagnosis Date  ? Atrial fibrillation ([place]) 03/21/2016  ? ? ?Subjective:  ? ?I have reviewed medical records including EPIC notes, labs and imaging, and met at bedside with patient to discuss diagnosis, prognosis, GOC, EOL wishes, disposition, and options.  He remains on milrinone infusion.  He is out of bed to the recliner.  He has no acute complaints.  He reports feeling much better.  Note that he walked 150 feet with physical therapy earlier today. ? ?I introduced Palliative Medicine as specialized medical care for people living with serious illness. It focuses on providing relief from the symptoms and stress of a serious illness.  ? ?Life Review: ?[name] [name] was born in [place].  He tells me he immigrated to the [place] for the weather, as he was tired of the snow.  He and [name] [name] have been married 50+ years.  They have 2 sons - 1 who lives in [place] and 1 who lives in [place].  He is retired from working Mining engineer. ? ?Functional Status: ?[name] [name] lives at home with his wife.  Prior to admission he was completely independent.  He continues to drive.  We discussed current recommendation from PT/OT for home health and home PT, however [name] [name] states he does not need this. ? ?Discussion: ?We discussed his current illness and what it means in the larger context of his ongoing co-morbidities.  Provided education on the natural trajectory of chronic illness.  [name] [name] verbalizes understanding that heart failure is non-curable and progressive. Discussed that chronic illness results can result in decreased functional status over time, as patients may not return to previous baseline after having an exacerbation or illness.  ? ?Discussed that per cardiology, he is diuresing well.  He states that the medications have made him feel "much better".  Discussed that the fluid will return at some point, to which she verbalizes understanding.  Discussed the importance of compliance with all prescribed medications. ? ?I attempted to elicit values and goals of care important to the patient.  His goal of care is for continued improvement and to return home. ? ?The difference between aggressive medical intervention and comfort care was considered in light of the patient's goals of care. We did discuss code status. Encouraged patient to consider DNR/DNI status understanding evidenced based poor outcomes in similar hospitalized patients, as the cause of the arrest is likely associated with chronic/terminal disease rather than a reversible acute cardio-pulmonary event. I  explained that DNR/DNI is a protective measure to keep [place] from harming the patient in their last moments of life. He indicates that he agrees that DNR is appropriate.  ? ?Advanced directives, concepts specific to code status, artifical feeding and hydration, and rehospitalization were considered and discussed. I provided [name] [name] with an advanced directive packet for  review. He states that his wife [name] [name] would make decisions for him if needed.  ? ?Questions and concerns were addressed.  The patient was encouraged to call with questions or concerns.  ? ? ?Review of Systems  ?Respiratory:  Negative for shortness of breath.   ? ?Objective:  ? ?Primary Diagnoses: ?Present on Admission: ? Swelling of lower extremity ? Bilateral pleural effusion ? Acute combined systolic and diastolic heart failure ([place]) ? ? ?Physical Exam ?Vitals reviewed.  ?Constitutional:   ?   General: He is not in acute distress. ?   Appearance: He is ill-appearing.  ?Pulmonary:  ?   Effort: Pulmonary effort is normal.  ?Neurological:  ?   Mental Status: He is alert and oriented to person, place, and time.  ? ? ?Vital Signs:  ?BP (!) 155/65   Pulse 72   Temp 97.8 ?F (36.6 ?C) (Oral)   Resp 18   Ht '5\' 2"'  (1.575 m)   Wt 60.2 kg   SpO2 96%   BMI 24.29 kg/m?  ? ?Palliative Assessment/Data: PPS 50-60% ? ? ? ?Assessment & Plan:  ? ?SUMMARY OF RECOMMENDATIONS   ?CODE STATUS changed to DNR/DNI ?Continue all current interventions ?Goal of care is for continued improvement and to return home ?PMT will continue to follow ? ?Primary Decision Maker: ?PATIENT ? ?Prognosis:  ?Unable to determine ? ?Discharge Planning:  ?Home with Home Health  ? ? ?Thank you for allowing [place] to participate in the care of [name] [name] ? ? ?MDM - High ? ?Signed by: ?Elie Confer, NP ?Palliative Medicine Team ? ?Team Phone # [phone] (Nights/Weekends) ? ? ? ? ? ? ? ? ? ? ? ? ? ? ? ?

## 2022-03-23 NOTE — Progress Notes (Signed)
Family Medicine Teaching Service ?Daily Progress Note ?Intern Pager: [phone] ? ?Patient name: [name] [name] Medical record number: [id number] ?Date of birth: 02/21/1941 Age: 81 y.o. Gender: male ? ?Primary Care Provider: Orpah Melter, MD ?Consultants: Cardiology, HF, IR, VVS ?Code Status: Full ? ?Pt Overview and Major Events to Date:  ?4/4 - Admitted, R thoracentesis ? ?Assessment and Plan: ?Tumor markers 81 year old male presenting with acute exacerbation of HFrEF, aggressively diuresing.  Past medical history significant for HFmrEF, CAD on aspirin and Plavix status post CABG x4, PAD, A-fib, type anemia, hypertension, CKD stage III ? ?Decompensated biventricular heart failure  HFrEF ?UOP 6.35 L, net output 4.3 L, weight down from 62 kg to 60.2 kg. ?-Cardiology following, appreciate recommendations ?-Heart failure team following appreciate recommendations ?-Plan for left heart cath/right heart cath when more euvolemic ?-Continue milrinone ?-Continue Lasix 80 mg IV twice daily, plan to switch to PO tomorrow ?-Strict I's/O with daily weights ?-Continue TED hose ?-Scheduled repletion of potassium 40 mEq twice daily ? ?Acute on chronic renal insufficiency ?Mild increase in creatinine 1.71> 1.85. Baseline Cr ~1.4 ?-AM BMP ? ?Severe PAD ?CAD s/p CABG x4 ?Right carotid artery occlusion ?-Continue aspirin and Plavix ?-Elevated BLE with 1-2 pillows ?-Outpatient VVS follow-up ? ?Permanent A-fib, history of LAA clipping ?-Rate controlled ?-Continue home aspirin and Plavix ?-No anticoagulation per patient preference ? ?Goals of care ?Patient has multiple severe medical conditions and intermittently noncompliant with medications, currently full code.  Patient is agreeable to palliative care. ?-Palliative care consult, appreciate recommendations ? ?Hyperlipidemia ?-Continue home Lipitor 80 mg daily ? ?HTN ?BP 155/65 ?-Holding home ARB, Aldactone, Coreg ? ?FEN/GI: Heart healthy ?PPx: Lovenox ?Dispo:Home in 3 or more days.  Barriers include aggressive diuresis and cardiac work-up.  ? ?Subjective:  ?Patient seen and assessed at bedside.  Patient reports no acute complaints.  Patient amenable to palliative consult.  Reports wife will be coming in with patient's home medications. ? ?Objective: ?Temp:  [97.7 ?F (36.5 ?C)-98.6 ?F (37 ?C)] 98.6 ?F (37 ?C) (04/10 0428) ?Pulse Rate:  [70-86] 70 (04/10 0428) ?Resp:  [16-19] 19 (04/10 0428) ?BP: (96-147)/(59-77) 147/65 (04/10 0428) ?SpO2:  [96 %-100 %] 96 % (04/10 0428) ?Weight:  [60.2 kg] 60.2 kg (04/10 0428) ?Physical Exam: ?General: resting comfortable, NAD ?Cardiovascular: irregular rhythm, regular rate ?Respiratory: CTAB ?Abdomen: soft, nontender ?Extremities: improved BLE swelling, still prominent edema in left foot ? ?Laboratory: ?Recent Labs  ?Lab 03/18/22 ?0331 03/19/22 ?0506 03/23/22 ?0256  ?WBC 8.3 6.3 7.7  ?HGB 15.0 12.6* 13.5  ?HCT 44.7 37.8* 38.7*  ?PLT 175 134* 151  ? ?Recent Labs  ?Lab 03/17/22 ?1034 03/18/22 ?0331 03/21/22 ?[id number] 03/22/22 ?[id number] 03/23/22 ?0256  ?NA  --    < > 135 135 132*  ?K  --    < > 4.0 4.6 4.4  ?CL  --    < > 102 98 95*  ?CO2  --    < > 25 31 30   ?BUN  --    < > 41* 41* 41*  ?CREATININE  --    < > 1.70* 1.71* 1.85*  ?CALCIUM  --    < > 9.8 9.8 9.6  ?PROT 6.5  --   --   --   --   ?BILITOT 1.2  --   --   --   --   ?ALKPHOS 93  --   --   --   --   ?ALT 24  --   --   --   --   ?  AST 31  --   --   --   --   ?GLUCOSE  --    < > 82 89 102*  ? < > = values in this interval not displayed.  ? ? ? ? ?Imaging/Diagnostic Tests: ?No results found. ? ?France Ravens, MD ?03/23/2022, 7:33 AM ?PGY-1, [place] ?[place] Intern pager: [phone], text pages welcome ?

## 2022-03-24 ENCOUNTER — Encounter (HOSPITAL_COMMUNITY)
Admission: EM | Disposition: A | Discharge status: Home / Self Care | Payer: Self-pay | Source: Home / Self Care | Attending: Family Medicine

## 2022-03-24 ENCOUNTER — Inpatient Hospital Stay (HOSPITAL_COMMUNITY): Payer: Medicare HMO | Admitting: General Practice

## 2022-03-24 ENCOUNTER — Encounter (HOSPITAL_COMMUNITY): Payer: Self-pay | Admitting: Student

## 2022-03-24 ENCOUNTER — Inpatient Hospital Stay (HOSPITAL_COMMUNITY): Payer: Medicare HMO

## 2022-03-24 ENCOUNTER — Inpatient Hospital Stay (HOSPITAL_COMMUNITY)
Admission: EM | Disposition: A | Discharge status: Home / Self Care | Payer: Self-pay | Source: Home / Self Care | Attending: Family Medicine

## 2022-03-24 DIAGNOSIS — I340 Nonrheumatic mitral (valve) insufficiency: Secondary | ICD-10-CM

## 2022-03-24 DIAGNOSIS — I5082 Biventricular heart failure: Secondary | ICD-10-CM | POA: Diagnosis not present

## 2022-03-24 DIAGNOSIS — I2510 Atherosclerotic heart disease of native coronary artery without angina pectoris: Secondary | ICD-10-CM

## 2022-03-24 DIAGNOSIS — I509 Heart failure, unspecified: Secondary | ICD-10-CM | POA: Diagnosis not present

## 2022-03-24 DIAGNOSIS — I5021 Acute systolic (congestive) heart failure: Secondary | ICD-10-CM

## 2022-03-24 DIAGNOSIS — N1832 Chronic kidney disease, stage 3b: Secondary | ICD-10-CM

## 2022-03-24 DIAGNOSIS — I272 Pulmonary hypertension, unspecified: Secondary | ICD-10-CM | POA: Diagnosis not present

## 2022-03-24 DIAGNOSIS — I361 Nonrheumatic tricuspid (valve) insufficiency: Secondary | ICD-10-CM | POA: Diagnosis not present

## 2022-03-24 DIAGNOSIS — M7989 Other specified soft tissue disorders: Secondary | ICD-10-CM | POA: Diagnosis not present

## 2022-03-24 DIAGNOSIS — I5022 Chronic systolic (congestive) heart failure: Secondary | ICD-10-CM

## 2022-03-24 DIAGNOSIS — I130 Hypertensive heart and chronic kidney disease with heart failure and stage 1 through stage 4 chronic kidney disease, or unspecified chronic kidney disease: Secondary | ICD-10-CM

## 2022-03-24 HISTORY — PX: TEE WITHOUT CARDIOVERSION: SHX5443

## 2022-03-24 HISTORY — PX: RIGHT/LEFT HEART CATH AND CORONARY/GRAFT ANGIOGRAPHY: CATH118267

## 2022-03-24 LAB — POCT I-STAT EG7
Acid-Base Excess: 5 mmol/L — ABNORMAL HIGH (ref 0.0–2.0)
Acid-Base Excess: 7 mmol/L — ABNORMAL HIGH (ref 0.0–2.0)
Acid-Base Excess: 8 mmol/L — ABNORMAL HIGH (ref 0.0–2.0)
Bicarbonate: 30.7 mmol/L — ABNORMAL HIGH (ref 20.0–28.0)
Bicarbonate: 32.5 mmol/L — ABNORMAL HIGH (ref 20.0–28.0)
Bicarbonate: 33.4 mmol/L — ABNORMAL HIGH (ref 20.0–28.0)
Calcium, Ion: 1.24 mmol/L (ref 1.15–1.40)
Calcium, Ion: 1.4 mmol/L (ref 1.15–1.40)
Calcium, Ion: 1.39 mmol/L (ref 1.15–1.40)
HCT: 38 % — ABNORMAL LOW (ref 39.0–52.0)
HCT: 40 % (ref 39.0–52.0)
HCT: 39 % (ref 39.0–52.0)
Hemoglobin: 12.9 g/dL — ABNORMAL LOW (ref 13.0–17.0)
Hemoglobin: 13.6 g/dL (ref 13.0–17.0)
Hemoglobin: 13.3 g/dL (ref 13.0–17.0)
O2 Saturation: 72 %
O2 Saturation: 76 %
O2 Saturation: 74 %
Potassium: 4.2 mmol/L (ref 3.5–5.1)
Potassium: 4.6 mmol/L (ref 3.5–5.1)
Potassium: 4.5 mmol/L (ref 3.5–5.1)
Sodium: 132 mmol/L — ABNORMAL LOW (ref 135–145)
Sodium: 135 mmol/L (ref 135–145)
Sodium: 133 mmol/L — ABNORMAL LOW (ref 135–145)
TCO2: 32 mmol/L (ref 22–32)
TCO2: 34 mmol/L — ABNORMAL HIGH (ref 22–32)
TCO2: 35 mmol/L — ABNORMAL HIGH (ref 22–32)
pCO2, Ven: 46.4 mmHg (ref 44–60)
pCO2, Ven: 47.8 mmHg (ref 44–60)
pCO2, Ven: 48.1 mmHg (ref 44–60)
pH, Ven: 7.429 (ref 7.25–7.43)
pH, Ven: 7.44 — ABNORMAL HIGH (ref 7.25–7.43)
pH, Ven: 7.45 — ABNORMAL HIGH (ref 7.25–7.43)
pO2, Ven: 37 mmHg (ref 32–45)
pO2, Ven: 40 mmHg (ref 32–45)
pO2, Ven: 38 mmHg (ref 32–45)

## 2022-03-24 LAB — BASIC METABOLIC PANEL
Anion gap: 4 — ABNORMAL LOW (ref 5–15)
BUN: 37 mg/dL — ABNORMAL HIGH (ref 8–23)
CO2: 35 mmol/L — ABNORMAL HIGH (ref 22–32)
Calcium: 10 mg/dL (ref 8.9–10.3)
Chloride: 95 mmol/L — ABNORMAL LOW (ref 98–111)
Creatinine, Ser: 1.6 mg/dL — ABNORMAL HIGH (ref 0.61–1.24)
GFR, Estimated: 43 mL/min — ABNORMAL LOW (ref >60)
Glucose, Bld: 105 mg/dL — ABNORMAL HIGH (ref 70–99)
Potassium: 4.7 mmol/L (ref 3.5–5.1)
Sodium: 134 mmol/L — ABNORMAL LOW (ref 135–145)

## 2022-03-24 LAB — CBC
HCT: 40.9 % (ref 39.0–52.0)
Hemoglobin: 14.2 g/dL (ref 13.0–17.0)
MCH: 35.7 pg — ABNORMAL HIGH (ref 26.0–34.0)
MCHC: 34.7 g/dL (ref 30.0–36.0)
MCV: 102.8 fL — ABNORMAL HIGH (ref 80.0–100.0)
Platelets: 158 10*3/uL (ref 150–400)
RBC: 3.98 MIL/uL — ABNORMAL LOW (ref 4.22–5.81)
RDW: 14.6 % (ref 11.5–15.5)
WBC: 7.5 10*3/uL (ref 4.0–10.5)
nRBC: 0 % (ref 0.0–0.2)

## 2022-03-24 LAB — POCT I-STAT 7, (LYTES, BLD GAS, ICA,H+H)
Acid-Base Excess: 8 mmol/L — ABNORMAL HIGH (ref 0.0–2.0)
Bicarbonate: 31.9 mmol/L — ABNORMAL HIGH (ref 20.0–28.0)
Calcium, Ion: 1.16 mmol/L (ref 1.15–1.40)
HCT: 36 % — ABNORMAL LOW (ref 39.0–52.0)
Hemoglobin: 12.2 g/dL — ABNORMAL LOW (ref 13.0–17.0)
O2 Saturation: 99 %
Potassium: 4.1 mmol/L (ref 3.5–5.1)
Sodium: 136 mmol/L (ref 135–145)
TCO2: 33 mmol/L — ABNORMAL HIGH (ref 22–32)
pCO2 arterial: 41.8 mmHg (ref 32–48)
pH, Arterial: 7.49 — ABNORMAL HIGH (ref 7.35–7.45)
pO2, Arterial: 142 mmHg — ABNORMAL HIGH (ref 83–108)

## 2022-03-24 LAB — ECHO TEE
Area-P 1/2: 8.38 cm2
MV M vel: 5.68 m/s
MV Peak grad: 128.9 mmHg
Radius: 0.85 cm

## 2022-03-24 SURGERY — RIGHT/LEFT HEART CATH AND CORONARY/GRAFT ANGIOGRAPHY
Anesthesia: LOCAL

## 2022-03-24 SURGERY — ECHOCARDIOGRAM, TRANSESOPHAGEAL
Anesthesia: Monitor Anesthesia Care

## 2022-03-24 MED ORDER — CLOPIDOGREL BISULFATE 75 MG PO TABS
75.0000 mg | ORAL_TABLET | ORAL | Status: AC
  Administered 2022-03-24: 75 mg via ORAL
  Filled 2022-03-24: qty 1

## 2022-03-24 MED ORDER — LIDOCAINE 2% (20 MG/ML) 5 ML SYRINGE
INTRAMUSCULAR | Status: DC | PRN
  Administered 2022-03-24: 100 mg via INTRAVENOUS

## 2022-03-24 MED ORDER — LIDOCAINE HCL (PF) 1 % IJ SOLN
INTRAMUSCULAR | Status: AC
  Filled 2022-03-24: qty 30

## 2022-03-24 MED ORDER — HEPARIN SODIUM (PORCINE) 1000 UNIT/ML IJ SOLN
INTRAMUSCULAR | Status: AC
  Filled 2022-03-24: qty 10

## 2022-03-24 MED ORDER — HEPARIN SODIUM (PORCINE) 1000 UNIT/ML IJ SOLN
INTRAMUSCULAR | Status: DC | PRN
  Administered 2022-03-24: 3000 [IU] via INTRAVENOUS

## 2022-03-24 MED ORDER — HEPARIN (PORCINE) IN NACL 1000-0.9 UT/500ML-% IV SOLN
INTRAVENOUS | Status: AC
  Filled 2022-03-24: qty 1000

## 2022-03-24 MED ORDER — VERAPAMIL HCL 2.5 MG/ML IV SOLN
INTRAVENOUS | Status: AC
  Filled 2022-03-24: qty 2

## 2022-03-24 MED ORDER — PHENYLEPHRINE HCL-NACL 20-0.9 MG/250ML-% IV SOLN
INTRAVENOUS | Status: DC | PRN
  Administered 2022-03-24: 40 ug/min via INTRAVENOUS

## 2022-03-24 MED ORDER — LIDOCAINE HCL (PF) 1 % IJ SOLN
INTRAMUSCULAR | Status: DC | PRN
  Administered 2022-03-24: 4 mL
  Administered 2022-03-24: 1 mL

## 2022-03-24 MED ORDER — VERAPAMIL HCL 2.5 MG/ML IV SOLN
INTRAVENOUS | Status: DC | PRN
  Administered 2022-03-24 (×2): 10 mL via INTRA_ARTERIAL

## 2022-03-24 MED ORDER — SODIUM CHLORIDE 0.9 % IV SOLN
INTRAVENOUS | Status: AC | PRN
  Administered 2022-03-24: 10 mL/h via INTRAVENOUS

## 2022-03-24 MED ORDER — PHENYLEPHRINE 40 MCG/ML (10ML) SYRINGE FOR IV PUSH (FOR BLOOD PRESSURE SUPPORT)
PREFILLED_SYRINGE | INTRAVENOUS | Status: DC | PRN
  Administered 2022-03-24 (×2): 80 ug via INTRAVENOUS

## 2022-03-24 MED ORDER — HEPARIN (PORCINE) IN NACL 1000-0.9 UT/500ML-% IV SOLN
INTRAVENOUS | Status: DC | PRN
  Administered 2022-03-24 (×2): 500 mL

## 2022-03-24 MED ORDER — PROPOFOL 500 MG/50ML IV EMUL
INTRAVENOUS | Status: DC | PRN
  Administered 2022-03-24: 100 ug/kg/min via INTRAVENOUS

## 2022-03-24 MED ORDER — CLOPIDOGREL BISULFATE 75 MG PO TABS
75.0000 mg | ORAL_TABLET | Freq: Every day | ORAL | Status: DC
  Administered 2022-03-25: 75 mg via ORAL
  Filled 2022-03-24: qty 1

## 2022-03-24 SURGICAL SUPPLY — 16 items
CATH BALLN WEDGE 5F 110CM (CATHETERS) ×1 IMPLANT
CATH INFINITI 5 FR JL3.5 (CATHETERS) ×1 IMPLANT
CATH INFINITI 5 FR LCB (CATHETERS) ×1 IMPLANT
CATH INFINITI 5FR AL1 (CATHETERS) ×1 IMPLANT
CATH INFINITI 5FR MULTPACK ANG (CATHETERS) ×1 IMPLANT
DEVICE RAD COMP TR BAND LRG (VASCULAR PRODUCTS) ×2 IMPLANT
GLIDESHEATH SLEND SS 6F .021 (SHEATH) ×2 IMPLANT
GUIDEWIRE .025 260CM (WIRE) ×1 IMPLANT
GUIDEWIRE INQWIRE 1.5J.035X260 (WIRE) IMPLANT
INQWIRE 1.5J .035X260CM (WIRE) ×2
KIT HEART LEFT (KITS) ×1 IMPLANT
PACK CARDIAC CATHETERIZATION (CUSTOM PROCEDURE TRAY) ×2 IMPLANT
SHEATH GLIDE SLENDER 4/5FR (SHEATH) ×1 IMPLANT
TRANSDUCER W/STOPCOCK (MISCELLANEOUS) ×2 IMPLANT
TUBING CIL FLEX 10 FLL-RA (TUBING) ×1 IMPLANT
WIRE HI TORQ VERSACORE-J 145CM (WIRE) ×1 IMPLANT

## 2022-03-24 NOTE — H&P (View-Only) (Signed)
? ? Advanced Heart Failure Rounding Note ? ?PCP-Cardiologist: Shelva Majestic, MD  ? ?Subjective:   ?Admit weight 146-->130  ? ?Yesterday diuresed with IV lasix. Weight down another 2 pounds.  ? ?On Milrinone 0.125 .No PICC.  ? ?SCr improved initially, down from 2.08>>1.70>>1.71>>1.85>>1.6  today.  ? ?Feels ok. Denies SOB  ? ?Objective:   ?Weight Range: ?59.4 kg ?Body mass index is 23.95 kg/m?.  ? ?Vital Signs:   ?Temp:  [97.8 ?F (36.6 ?C)-98.6 ?F (37 ?C)] 98.6 ?F (37 ?C) (04/11 0348) ?Pulse Rate:  [74-79] 79 (04/11 0348) ?Resp:  [18] 18 (04/11 0348) ?BP: (98-110)/(68-72) 110/72 (04/11 0348) ?SpO2:  [97 %-98 %] 97 % (04/11 0348) ?Weight:  [59.4 kg] 59.4 kg (04/11 0348) ?Last BM Date : 03/23/22 ? ?Weight change: ?Filed Weights  ? 03/22/22 0250 03/23/22 0428 03/24/22 0348  ?Weight: 62 kg 60.2 kg 59.4 kg  ? ? ?Intake/Output:  ? ?Intake/Output Summary (Last 24 hours) at 03/24/2022 0745 ?Last data filed at 03/24/2022 0348 ?Gross per 24 hour  ?Intake 1455.13 ml  ?Output 3375 ml  ?Net -1919.87 ml  ?  ? ? ?Physical Exam  ? General:  Sitting in chair.  No resp difficulty ?HEENT: normal ?Neck: supple. JVP 8-9. Carotids 2+ bilat; no bruits. No lymphadenopathy or thryomegaly appreciated. ?Cor: PMI nondisplaced. Irregular rate & rhythm. No rubs, gallops . 3/6 MR/TR. ?Lungs: clear ?Abdomen: soft, nontender, nondistended. No hepatosplenomegaly. No bruits or masses. Good bowel sounds. ?Extremities: no cyanosis, clubbing, rash, R and LLE 1+ edema with ted hose.  ?Neuro: alert & orientedx3, cranial nerves grossly intact. moves all 4 extremities w/o difficulty. Affect pleasant ? ?Telemetry  ? ?A fib 70s  ? ?EKG  ?  ?No new EKG to review  ? ?Labs  ?  ?CBC ?Recent Labs  ?  03/23/22 ?0256 03/24/22 ?0406  ?WBC 7.7 7.5  ?HGB 13.5 14.2  ?HCT 38.7* 40.9  ?MCV 102.4* 102.8*  ?PLT 151 158  ? ?Basic Metabolic Panel ?Recent Labs  ?  03/22/22 ?[id number] 03/23/22 ?0256 03/24/22 ?0406  ?NA 135 132* 134*  ?K 4.6 4.4 4.7  ?CL 98 95* 95*  ?CO2 31 30 35*   ?GLUCOSE 89 102* 105*  ?BUN 41* 41* 37*  ?CREATININE 1.71* 1.85* 1.60*  ?CALCIUM 9.8 9.6 10.0  ?MG 2.1  --   --   ? ?Liver Function Tests ?No results for input(s): AST, ALT, ALKPHOS, BILITOT, PROT, ALBUMIN in the last 72 hours. ?No results for input(s): LIPASE, AMYLASE in the last 72 hours. ?Cardiac Enzymes ?No results for input(s): CKTOTAL, CKMB, CKMBINDEX, TROPONINI in the last 72 hours. ? ?BNP: ?BNP (last 3 results) ?Recent Labs  ?  03/17/22 ?1034  ?BNP 714.2*  ? ? ?ProBNP (last 3 results) ?No results for input(s): PROBNP in the last 8760 hours. ? ? ?D-Dimer ?No results for input(s): DDIMER in the last 72 hours. ?Hemoglobin A1C ?No results for input(s): HGBA1C in the last 72 hours. ?Fasting Lipid Panel ?No results for input(s): CHOL, HDL, LDLCALC, TRIG, CHOLHDL, LDLDIRECT in the last 72 hours. ?Thyroid Function Tests ?No results for input(s): TSH, T4TOTAL, T3FREE, THYROIDAB in the last 72 hours. ? ?Invalid input(s): FREET3 ? ?Other results: ? ? ?Imaging  ? ? ?No results found. ? ? ?Medications:   ? ? ?Scheduled Medications: ? aspirin  81 mg Oral Daily  ? atorvastatin  80 mg Oral q1800  ? [START ON 03/25/2022] clopidogrel  75 mg Oral Daily  ? enoxaparin (LOVENOX) injection  30 mg Subcutaneous Q24H  ?  multivitamin with minerals  1 tablet Oral Daily  ? potassium chloride  40 mEq Oral BID  ? sodium chloride flush  3 mL Intravenous Q12H  ? ? ?Infusions: ? sodium chloride    ? sodium chloride    ? sodium chloride    ? milrinone 0.125 mcg/kg/min (03/23/22 2209)  ? ? ?PRN Medications: ?sodium chloride, acetaminophen **OR** acetaminophen, lidocaine, sodium chloride flush ? ? ? ?Patient Profile  ? ?81 y/o male w/ h/o chronic systolic heart failure due to ischemic CM, CAD s/p CABG x 4, permanent atrial fibrillation, mitral regurgitation, HTN, HLD, PAD and Stage III CKD, admitted w/ a/c biventricular heart failure w/ progressive NYHA Class IIIb symptoms and marked fluid overload + severe MR and TR, c/b worsening renal  function. Started on empiric milrinone to argument diuresis.  ? ?Assessment/Plan  ? ?Acute on Chronic Biventricular Heart Failure ?- Ischemic CM, severe MVCAD s/p CABG in 2017 ?- Echo 2017 EF 25-30%, RV normal  ?- Echo 2018 EF 20-25%, RV mildly reduced ?- Echo 2021 EF improved 45-50%, RV normal  ?- Echo this admit EF 30-35%, diffuse HK, RV moderately reduced  ?- Cause of drop in EF uncertain as he had been lost to f/u. ? New coronary ischemia, though no recent CP. HS trend not c/w ACS. ? Tachy mediated. Has chronic afib but uncertain how well rate controlled he has been ?- Admitted w/ NYHA Class IIIb w/ marked fluid overload c/b worsening renal function w/ diuresis concerning for low output. Started on empiric milrinone 0.125 to augment diuresis  ?- remains on milrinone.  Wt down 16 lb.Adjust diuretics post cath.  ?- continue milrinone for now.  ?- c/w gentle LE compression w/ TED hoses, avoiding UNNA boots w/ PAD  ?- off coreg w/ low output  ?- GDMT currently limited by elevated SCr.  May need hydral/nitrate once off milrinone   ?- Will need RHC today  ?   ?2. Severe Mitral Regurgitation  ?- severe on echo w/ posterior leaflet tethering ?- suspect mostly functional, in the setting of severe dilated CM + chronic afib  ?- continue to optimize from CHF standpoint (see above) ?- Structural heart team to evaluate for possible MitraClip but not sure he is ideal candidate  ?- TEE today  ?  ?3. Severe Tricuspid Regurgitation  ?- functional, from dilated RV ?- HF optimization per above  ?  ?4. CAD ?- s/p CABG x 4 in 2017 (LIMA to LAD, SVG to OM, SVG to PDA, and SVG to diagonal) ?- No chest pain  ?- continue medical therapy, on asa + statin  ?  ?5. Permanent Atrial Fibrillation  ?- Chronic since 2017, failed cardioversion ?- LAA clip at time of CABG in 2017 ?- no longer on a/c given h/o frequent nose bleeds ?- holding ? blocker w/ low output  ?- low threshold to add amiodarone gtt while on milrinone (v-rates currently  stable)  ?  ?6. AKI on Stage IIIa ?- last baseline SCr ~1.2 ?- SCr 1.40 on admit, worsening w/ attempts at diuresis.   ?- suspect cardiorenal, low output from Biv failure and severe MR + renal vein congestion from fluid overload ?- improved w/ empiric milrinone + diuresis  ?- Scr 1.4>1.5>1.8>1.9>2.1>1.7>1.9 >1.6 ?- continue to monitor  ?  ?7. Pleural Effusion  ?- s/p Rt Thoracentesis 4/4 w/ 1.3L fluid removal  ?  ?8. PAD ?- h/o severe Caroid artery stenosis, s/p left CEA ?- LE PAD w/ evidence of multilevel arterial occlusive disease ?-  reduced ABI on rt, c/w moderate disease + LE wound ?- Seen by Dr. Scot Dock this admit, he will follow outpatient  ? ?[place] today. TEE later this afternoon.  ?Length of Stay: 7 ? ?Darrick Grinder, NP  ?03/24/2022, 7:45 AM ? ?Advanced Heart Failure Team ?Pager [phone] (M-F; 7a - 5p)  ?Please contact [facility] for night-coverage after hours (5p -7a ) and weekends on [email or web address] ? ?Patient seen and examined with the above-signed Advanced Practice Provider and/or Housestaff. I personally reviewed laboratory data, imaging studies and relevant notes. I independently examined the patient and formulated the important aspects of the plan. I have edited the note to reflect any of my changes or salient points. I have personally discussed the plan with the patient and/or family. ? ? ?Feels better. Denies CP or SOB. Remains on milrinone. Lasix off.  ? ?General:  Elderly appearing. No resp difficulty ?HEENT: normal ?Neck: supple. no JVD. Carotids 2+ bilat; no bruits. No lymphadenopathy or thryomegaly appreciated. ?Cor: PMI nondisplaced. Irregular rate & rhythm.soft MR ?Lungs: clear ?Abdomen: soft, nontender, nondistended. No hepatosplenomegaly. No bruits or masses. Good bowel sounds. ?Extremities: no cyanosis, clubbing, rash, edema ?Neuro: alert & orientedx3, cranial nerves grossly intact. moves all 4 extremities w/o difficulty. Affect pleasant ? ?Volume status much improved. Scr down to 1.6. Will  proceed with RHC as well coronary/graft angio today followed by TEE to assess MR. D/w Structural Team including Drs. Cooper and [employer].  ? ?Glori Bickers, MD  ?8:11 AM ? ? ?

## 2022-03-24 NOTE — Progress Notes (Signed)
PT Cancellation Note ? ?Patient Details ?Name: [name] [name] ?MRN: [id number] ?DOB: 05-24-1941 ? ? ?Cancelled Treatment:    Reason Eval/Treat Not Completed: Patient at procedure or test/unavailable. Patient off floor at this time, will re-attempt later as time allows.  ? ? ?Markhi Kleckner ?03/24/2022, 9:43 AM ? ? ?

## 2022-03-24 NOTE — Interval H&P Note (Signed)
History and Physical Interval Note: ? ?03/24/2022 ?10:12 AM ? ?[name] [name]  has presented today for surgery, with the diagnosis of heart failure.  The various methods of treatment have been discussed with the patient and family. After consideration of risks, benefits and other options for treatment, the patient has consented to  Procedure(s): ?RIGHT/LEFT HEART CATH AND CORONARY/GRAFT ANGIOGRAPHY (N/A) and possible coronary angioplasty as a surgical intervention.  The patient's history has been reviewed, patient examined, no change in status, stable for surgery.  I have reviewed the patient's chart and labs.  Questions were answered to the patient's satisfaction.   ? ? ?[name] [name] ? ? ?

## 2022-03-24 NOTE — Anesthesia Preprocedure Evaluation (Addendum)
Anesthesia Evaluation  ?Patient identified by MRN, date of birth, ID band ?Patient awake ? ? ? ?Reviewed: ?Allergy & Precautions, NPO status , Patient's Chart, lab work & pertinent test results, reviewed documented beta blocker date and time  ? ?Airway ?Mallampati: II ? ?TM Distance: >3 FB ?Neck ROM: Full ? ? ? Dental ? ?(+) Edentulous Upper, Poor Dentition, Chipped, Missing, Dental Advisory Given ?  ?Pulmonary ?neg pulmonary ROS, former smoker,  ?  ?Pulmonary exam normal ?breath sounds clear to auscultation ? ? ? ? ? ? Cardiovascular ?hypertension, Pt. on home beta blockers and Pt. on medications ?+ CAD, + Peripheral Vascular Disease and +CHF  ?+ dysrhythmias Atrial Fibrillation + Valvular Problems/Murmurs AI and MR  ?Rhythm:Irregular Rate:Normal ? ?Echo 03/2022 ??1. Left ventricular ejection fraction, by estimation, is 30 to 35%. Left ventricular ejection fraction by 3D volume is 26%. The left ventricle has moderately decreased function. The left ventricle demonstrates global hypokinesis. There is mild concentric left ventricular hypertrophy. Left ventricular diastolic function could not be evaluated. There is the interventricular septum is flattened in diastole ('D' shaped left ventricle), consistent with right ventricular volume overload.  ??2. Right ventricular systolic function is moderately reduced. The right ventricular size is mildly enlarged. Mildly increased right ventricular wall thickness. There is severely elevated pulmonary artery systolic pressure.  ??3. Left atrial size was moderately dilated.  ??4. Right atrial size was severely dilated.  ??5. Large pleural effusion in the left lateral region.  ??6. There is mitral annular dilation and posterior leaflet tethering. Effective regurgitant orifice area is 0.35 cm sq, regurgitant volume is 67 ml, regurgitant fraction is 61%. The mitral valve is abnormal. Severe mitral valve regurgitation.  ??7. Tricuspid valve  regurgitation is severe.  ??8. The aortic valve is tricuspid. There is mild thickening of the aortic valve. Aortic valve regurgitation is moderate. Aortic valve sclerosis/calcification is present, without any evidence of aortic stenosis.  ?  ?Neuro/Psych ?negative neurological ROS ?   ? GI/Hepatic ?negative GI ROS, Neg liver ROS,   ?Endo/Other  ?negative endocrine ROS ? Renal/GU ?negative Renal ROS  ? ?  ?Musculoskeletal ?negative musculoskeletal ROS ?(+)  ? Abdominal ?  ?Peds ? Hematology ?negative hematology ROS ?(+)   ?Anesthesia Other Findings ? ? Reproductive/Obstetrics ? ?  ? ? ? ? ? ? ? ? ? ? ? ? ? ?  ?  ? ? ? ? ? ? ? ?Anesthesia Physical ?Anesthesia Plan ? ?ASA: 4 ? ?Anesthesia Plan: MAC  ? ?Post-op Pain Management: Minimal or no pain anticipated  ? ?Induction: Intravenous ? ?PONV Risk Score and Plan: 1 and Propofol infusion, TIVA and Treatment may vary due to age or medical condition ? ?Airway Management Planned: Natural Airway ? ?Additional Equipment:  ? ?Intra-op Plan:  ? ?Post-operative Plan:  ? ?Informed Consent: I have reviewed the patients History and Physical, chart, labs and discussed the procedure including the risks, benefits and alternatives for the proposed anesthesia with the patient or authorized representative who has indicated his/her understanding and acceptance.  ? ?Patient has DNR.  ? ?Dental advisory given ? ?Plan Discussed with: CRNA ? ?Anesthesia Plan Comments:   ? ? ? ? ? ?Anesthesia Quick Evaluation ? ?

## 2022-03-24 NOTE — Progress Notes (Signed)
?  Patient seen for post cath f/u. Says he is doing well and wants to go home tomorrow am to eat breakfast at home.  ? ?I will stop milrinone tonight.  ? ?Arvilla Meres, MD  ?11:45 PM ? ?

## 2022-03-24 NOTE — Transfer of Care (Signed)
Immediate Anesthesia Transfer of Care Note ? ?Patient: [name] [name] ? ?Procedure(s) Performed: TRANSESOPHAGEAL ECHOCARDIOGRAM (TEE) ? ?Patient Location: Endoscopy Unit ? ?Anesthesia Type:MAC ? ?Level of Consciousness: awake and drowsy ? ?Airway & Oxygen Therapy: Patient Spontanous Breathing and Patient connected to nasal cannula oxygen ? ?Post-op Assessment: Report given to RN and Post -op Vital signs reviewed and stable ? ?Post vital signs: Reviewed and stable ? ?Last Vitals:  ?Vitals Value Taken Time  ?BP 98/53 03/24/22 1400  ?Temp    ?Pulse 69 03/24/22 1403  ?Resp 20 03/24/22 1403  ?SpO2 98 % 03/24/22 1403  ?Vitals shown include unvalidated device data. ? ?Last Pain:  ?Vitals:  ? 03/24/22 1400  ?TempSrc:   ?PainSc: Asleep  ?   ? ?  ? ?Complications: No notable events documented. ?

## 2022-03-24 NOTE — Op Note (Signed)
INDICATIONS: ?Mitral valve disease  ? ?PROCEDURE:  ? ?Informed consent was obtained prior to the procedure. The risks, benefits and alternatives for the procedure were discussed and the patient comprehended these risks.  Risks include, but are not limited to, cough, sore throat, vomiting, nausea, somnolence, esophageal and stomach trauma or perforation, bleeding, low blood pressure, aspiration, pneumonia, infection, trauma to the teeth and death.   ? ?After a procedural time-out, the oropharynx was anesthetized with 20% benzocaine spray.  ? ?During this procedure the patient was administered IV propofol by Anesthesiology, Dr. Renold Don. ? ?The transesophageal probe was inserted in the esophagus and stomach without difficulty and multiple views were obtained.  The patient was kept under observation until the patient left the procedure room.  The patient left the procedure room in stable condition.  ? ?Agitated microbubble saline contrast was not administered. ? ?COMPLICATIONS:   ? ?There were no immediate complications. ? ?FINDINGS:  ?Severe ischemic cardiomyopathy with restricted motion of the posterior mitral leaflet and mitral malcoaptation. ?Moderate to severe mitral insufficiency, mostly at A2-P2. ?Signs of low cardiac output. ? ?RECOMMENDATIONS:   ? ?Continue evaluation for mitraclip - appears logistically feasible, although cardiomyopathy is probably the major driver of his HF/low cardiac output state. ? ?Time Spent Directly with the Patient: ? ?30 minutes  ? ?Lanea Vankirk ?03/24/2022, 1:52 PM ? ?

## 2022-03-24 NOTE — Progress Notes (Addendum)
? ? Advanced Heart Failure Rounding Note ? ?PCP-Cardiologist: Shelva Majestic, MD  ? ?Subjective:   ?Admit weight 146-->130  ? ?Yesterday diuresed with IV lasix. Weight down another 2 pounds.  ? ?On Milrinone 0.125 .No PICC.  ? ?SCr improved initially, down from 2.08>>1.70>>1.71>>1.85>>1.6  today.  ? ?Feels ok. Denies SOB  ? ?Objective:   ?Weight Range: ?59.4 kg ?Body mass index is 23.95 kg/m?.  ? ?Vital Signs:   ?Temp:  [97.8 ?F (36.6 ?C)-98.6 ?F (37 ?C)] 98.6 ?F (37 ?C) (04/11 0348) ?Pulse Rate:  [74-79] 79 (04/11 0348) ?Resp:  [18] 18 (04/11 0348) ?BP: (98-110)/(68-72) 110/72 (04/11 0348) ?SpO2:  [97 %-98 %] 97 % (04/11 0348) ?Weight:  [59.4 kg] 59.4 kg (04/11 0348) ?Last BM Date : 03/23/22 ? ?Weight change: ?Filed Weights  ? 03/22/22 0250 03/23/22 0428 03/24/22 0348  ?Weight: 62 kg 60.2 kg 59.4 kg  ? ? ?Intake/Output:  ? ?Intake/Output Summary (Last 24 hours) at 03/24/2022 0745 ?Last data filed at 03/24/2022 0348 ?Gross per 24 hour  ?Intake 1455.13 ml  ?Output 3375 ml  ?Net -1919.87 ml  ?  ? ? ?Physical Exam  ? General:  Sitting in chair.  No resp difficulty ?HEENT: normal ?Neck: supple. JVP 8-9. Carotids 2+ bilat; no bruits. No lymphadenopathy or thryomegaly appreciated. ?Cor: PMI nondisplaced. Irregular rate & rhythm. No rubs, gallops . 3/6 MR/TR. ?Lungs: clear ?Abdomen: soft, nontender, nondistended. No hepatosplenomegaly. No bruits or masses. Good bowel sounds. ?Extremities: no cyanosis, clubbing, rash, R and LLE 1+ edema with ted hose.  ?Neuro: alert & orientedx3, cranial nerves grossly intact. moves all 4 extremities w/o difficulty. Affect pleasant ? ?Telemetry  ? ?A fib 70s  ? ?EKG  ?  ?No new EKG to review  ? ?Labs  ?  ?CBC ?Recent Labs  ?  03/23/22 ?0256 03/24/22 ?0406  ?WBC 7.7 7.5  ?HGB 13.5 14.2  ?HCT 38.7* 40.9  ?MCV 102.4* 102.8*  ?PLT 151 158  ? ?Basic Metabolic Panel ?Recent Labs  ?  03/22/22 ?[id number] 03/23/22 ?0256 03/24/22 ?0406  ?NA 135 132* 134*  ?K 4.6 4.4 4.7  ?CL 98 95* 95*  ?CO2 31 30 35*   ?GLUCOSE 89 102* 105*  ?BUN 41* 41* 37*  ?CREATININE 1.71* 1.85* 1.60*  ?CALCIUM 9.8 9.6 10.0  ?MG 2.1  --   --   ? ?Liver Function Tests ?No results for input(s): AST, ALT, ALKPHOS, BILITOT, PROT, ALBUMIN in the last 72 hours. ?No results for input(s): LIPASE, AMYLASE in the last 72 hours. ?Cardiac Enzymes ?No results for input(s): CKTOTAL, CKMB, CKMBINDEX, TROPONINI in the last 72 hours. ? ?BNP: ?BNP (last 3 results) ?Recent Labs  ?  03/17/22 ?1034  ?BNP 714.2*  ? ? ?ProBNP (last 3 results) ?No results for input(s): PROBNP in the last 8760 hours. ? ? ?D-Dimer ?No results for input(s): DDIMER in the last 72 hours. ?Hemoglobin A1C ?No results for input(s): HGBA1C in the last 72 hours. ?Fasting Lipid Panel ?No results for input(s): CHOL, HDL, LDLCALC, TRIG, CHOLHDL, LDLDIRECT in the last 72 hours. ?Thyroid Function Tests ?No results for input(s): TSH, T4TOTAL, T3FREE, THYROIDAB in the last 72 hours. ? ?Invalid input(s): FREET3 ? ?Other results: ? ? ?Imaging  ? ? ?No results found. ? ? ?Medications:   ? ? ?Scheduled Medications: ? aspirin  81 mg Oral Daily  ? atorvastatin  80 mg Oral q1800  ? [START ON 03/25/2022] clopidogrel  75 mg Oral Daily  ? enoxaparin (LOVENOX) injection  30 mg Subcutaneous Q24H  ?  multivitamin with minerals  1 tablet Oral Daily  ? potassium chloride  40 mEq Oral BID  ? sodium chloride flush  3 mL Intravenous Q12H  ? ? ?Infusions: ? sodium chloride    ? sodium chloride    ? sodium chloride    ? milrinone 0.125 mcg/kg/min (03/23/22 2209)  ? ? ?PRN Medications: ?sodium chloride, acetaminophen **OR** acetaminophen, lidocaine, sodium chloride flush ? ? ? ?Patient Profile  ? ?81 y/o male w/ h/o chronic systolic heart failure due to ischemic CM, CAD s/p CABG x 4, permanent atrial fibrillation, mitral regurgitation, HTN, HLD, PAD and Stage III CKD, admitted w/ a/c biventricular heart failure w/ progressive NYHA Class IIIb symptoms and marked fluid overload + severe MR and TR, c/b worsening renal  function. Started on empiric milrinone to argument diuresis.  ? ?Assessment/Plan  ? ?Acute on Chronic Biventricular Heart Failure ?- Ischemic CM, severe MVCAD s/p CABG in 2017 ?- Echo 2017 EF 25-30%, RV normal  ?- Echo 2018 EF 20-25%, RV mildly reduced ?- Echo 2021 EF improved 45-50%, RV normal  ?- Echo this admit EF 30-35%, diffuse HK, RV moderately reduced  ?- Cause of drop in EF uncertain as he had been lost to f/u. ? New coronary ischemia, though no recent CP. HS trend not c/w ACS. ? Tachy mediated. Has chronic afib but uncertain how well rate controlled he has been ?- Admitted w/ NYHA Class IIIb w/ marked fluid overload c/b worsening renal function w/ diuresis concerning for low output. Started on empiric milrinone 0.125 to augment diuresis  ?- remains on milrinone.  Wt down 16 lb.Adjust diuretics post cath.  ?- continue milrinone for now.  ?- c/w gentle LE compression w/ TED hoses, avoiding UNNA boots w/ PAD  ?- off coreg w/ low output  ?- GDMT currently limited by elevated SCr.  May need hydral/nitrate once off milrinone   ?- Will need RHC today  ?   ?2. Severe Mitral Regurgitation  ?- severe on echo w/ posterior leaflet tethering ?- suspect mostly functional, in the setting of severe dilated CM + chronic afib  ?- continue to optimize from CHF standpoint (see above) ?- Structural heart team to evaluate for possible MitraClip but not sure he is ideal candidate  ?- TEE today  ?  ?3. Severe Tricuspid Regurgitation  ?- functional, from dilated RV ?- HF optimization per above  ?  ?4. CAD ?- s/p CABG x 4 in 2017 (LIMA to LAD, SVG to OM, SVG to PDA, and SVG to diagonal) ?- No chest pain  ?- continue medical therapy, on asa + statin  ?  ?5. Permanent Atrial Fibrillation  ?- Chronic since 2017, failed cardioversion ?- LAA clip at time of CABG in 2017 ?- no longer on a/c given h/o frequent nose bleeds ?- holding ? blocker w/ low output  ?- low threshold to add amiodarone gtt while on milrinone (v-rates currently  stable)  ?  ?6. AKI on Stage IIIa ?- last baseline SCr ~1.2 ?- SCr 1.40 on admit, worsening w/ attempts at diuresis.   ?- suspect cardiorenal, low output from Biv failure and severe MR + renal vein congestion from fluid overload ?- improved w/ empiric milrinone + diuresis  ?- Scr 1.4>1.5>1.8>1.9>2.1>1.7>1.9 >1.6 ?- continue to monitor  ?  ?7. Pleural Effusion  ?- s/p Rt Thoracentesis 4/4 w/ 1.3L fluid removal  ?  ?8. PAD ?- h/o severe Caroid artery stenosis, s/p left CEA ?- LE PAD w/ evidence of multilevel arterial occlusive disease ?-  reduced ABI on rt, c/w moderate disease + LE wound ?- Seen by Dr. Scot Dock this admit, he will follow outpatient  ? ?[place] today. TEE later this afternoon.  ?Length of Stay: 7 ? ?Darrick Grinder, NP  ?03/24/2022, 7:45 AM ? ?Advanced Heart Failure Team ?Pager [phone] (M-F; 7a - 5p)  ?Please contact [facility] for night-coverage after hours (5p -7a ) and weekends on [email or web address] ? ?Patient seen and examined with the above-signed Advanced Practice Provider and/or Housestaff. I personally reviewed laboratory data, imaging studies and relevant notes. I independently examined the patient and formulated the important aspects of the plan. I have edited the note to reflect any of my changes or salient points. I have personally discussed the plan with the patient and/or family. ? ? ?Feels better. Denies CP or SOB. Remains on milrinone. Lasix off.  ? ?General:  Elderly appearing. No resp difficulty ?HEENT: normal ?Neck: supple. no JVD. Carotids 2+ bilat; no bruits. No lymphadenopathy or thryomegaly appreciated. ?Cor: PMI nondisplaced. Irregular rate & rhythm.soft MR ?Lungs: clear ?Abdomen: soft, nontender, nondistended. No hepatosplenomegaly. No bruits or masses. Good bowel sounds. ?Extremities: no cyanosis, clubbing, rash, edema ?Neuro: alert & orientedx3, cranial nerves grossly intact. moves all 4 extremities w/o difficulty. Affect pleasant ? ?Volume status much improved. Scr down to 1.6. Will  proceed with RHC as well coronary/graft angio today followed by TEE to assess MR. D/w Structural Team including Drs. Cooper and [employer].  ? ?Glori Bickers, MD  ?8:11 AM ? ? ?

## 2022-03-24 NOTE — Interval H&P Note (Signed)
History and Physical Interval Note: ? ?03/24/2022 ?11:58 AM ? ?[name] [name]  has presented today for surgery, with the diagnosis of mitral regirg.  The various methods of treatment have been discussed with the patient and family. After consideration of risks, benefits and other options for treatment, the patient has consented to  Procedure(s): ?TRANSESOPHAGEAL ECHOCARDIOGRAM (TEE) (N/A) as a surgical intervention.  The patient's history has been reviewed, patient examined, no change in status, stable for surgery.  I have reviewed the patient's chart and labs.  Questions were answered to the patient's satisfaction.   ? ? ?[name] ? ? ?

## 2022-03-24 NOTE — Progress Notes (Signed)
?  Echocardiogram ?Echocardiogram Transesophageal has been performed. ? ?Augustine Radar ?03/24/2022, 2:55 PM ?

## 2022-03-24 NOTE — Progress Notes (Signed)
FPTS Night Rounding Progress Note ? ?S:Went to bedside to check on patient, he was sleeping comfortably. I did not awake the patient.  ? ?O: ?BP 98/68 (BP Location: Right Arm)   Pulse 74   Temp 97.8 ?F (36.6 ?C) (Oral)   Resp 18   Ht 5\' 2"  (1.575 m)   Wt 60.2 kg   SpO2 98%   BMI 24.29 kg/m?   ?General: Patient sleeping comfortably, in no acute distress. ?Resp: normal work of breathing n oted ? ?A/P: ?Patient admitted with acute on chronic heart failure. Heart failure team following, lasix discontinued per cardiology. NPO since midnight in anticipating for right heart cath. I/O=-1.45 L. Vitals and orders reviewed. Remainder of plan per day team.  ? , DO ?03/24/2022, 12:35 AM ?PGY-2, [facility] ?Service pager [phone]  ?

## 2022-03-24 NOTE — Anesthesia Procedure Notes (Signed)
Procedure Name: [place] ?Date/Time: 03/24/2022 1:30 PM ?Performed by: Dorann Lodge, CRNA ?Pre-anesthesia Checklist: Patient identified, Emergency Drugs available, Suction available and Patient being monitored ?Patient Re-evaluated:Patient Re-evaluated prior to induction ?Oxygen Delivery Method: Nasal cannula ?Induction Type: IV induction ?Airway Equipment and Method: Bite block ? ? ? ? ?

## 2022-03-25 ENCOUNTER — Other Ambulatory Visit (HOSPITAL_COMMUNITY): Payer: Self-pay

## 2022-03-25 ENCOUNTER — Encounter (HOSPITAL_COMMUNITY): Payer: Self-pay | Admitting: Internal Medicine

## 2022-03-25 DIAGNOSIS — M7989 Other specified soft tissue disorders: Secondary | ICD-10-CM | POA: Diagnosis not present

## 2022-03-25 LAB — BASIC METABOLIC PANEL
Anion gap: 8 (ref 5–15)
BUN: 33 mg/dL — ABNORMAL HIGH (ref 8–23)
CO2: 27 mmol/L (ref 22–32)
Calcium: 10.2 mg/dL (ref 8.9–10.3)
Chloride: 100 mmol/L (ref 98–111)
Creatinine, Ser: 1.58 mg/dL — ABNORMAL HIGH (ref 0.61–1.24)
GFR, Estimated: 44 mL/min — ABNORMAL LOW (ref >60)
Glucose, Bld: 137 mg/dL — ABNORMAL HIGH (ref 70–99)
Potassium: 5 mmol/L (ref 3.5–5.1)
Sodium: 135 mmol/L (ref 135–145)

## 2022-03-25 MED ORDER — ATORVASTATIN CALCIUM 80 MG PO TABS
80.0000 mg | ORAL_TABLET | Freq: Every day | ORAL | 0 refills | Status: DC
  Filled 2022-03-25: qty 90, 90d supply, fill #0

## 2022-03-25 MED ORDER — PHENOL 1.4 % MT LIQD: 1.0000 | OROMUCOSAL | Status: DC | PRN

## 2022-03-25 MED ORDER — ASPIRIN 81 MG PO CHEW
81.0000 mg | CHEWABLE_TABLET | Freq: Every day | ORAL | 0 refills | Status: AC
  Filled 2022-03-25: qty 30, 30d supply, fill #0

## 2022-03-25 MED ORDER — CLOPIDOGREL BISULFATE 75 MG PO TABS
75.0000 mg | ORAL_TABLET | Freq: Every day | ORAL | 0 refills | Status: DC
  Filled 2022-03-25: qty 30, 30d supply, fill #0

## 2022-03-25 MED ORDER — FUROSEMIDE 40 MG PO TABS
40.0000 mg | ORAL_TABLET | Freq: Two times a day (BID) | ORAL | 0 refills | Status: DC
  Filled 2022-03-25: qty 60, 30d supply, fill #0

## 2022-03-25 MED ORDER — CARVEDILOL 3.125 MG PO TABS
25.0000 mg | ORAL_TABLET | Freq: Two times a day (BID) | ORAL | 0 refills | Status: DC
  Filled 2022-03-25: qty 60, 4d supply, fill #0

## 2022-03-25 MED ORDER — POTASSIUM CHLORIDE CRYS ER 20 MEQ PO TBCR
20.0000 meq | EXTENDED_RELEASE_TABLET | Freq: Every day | ORAL | 0 refills | Status: DC
  Filled 2022-03-25: qty 60, 60d supply, fill #0

## 2022-03-25 MED FILL — Verapamil HCl IV Soln 2.5 MG/ML: INTRAVENOUS | Qty: 2 | Status: AC

## 2022-03-25 NOTE — Discharge Summary (Addendum)
Family Medicine Teaching Service ?Hospital Discharge Summary ? ?Patient name: [name] [name] Medical record number: [id number] ?Date of birth: 05-Dec-1941 Age: 81 y.o. Gender: male ?Date of Admission: 03/17/2022  Date of Discharge: 03/25/22 ?Admitting Physician: Erskine Emery, MD ? ?Primary Care Provider: Orpah Melter, MD ?Consultants: VVS, Cardiology, IR, palliative, HF ? ?Indication for Hospitalization: HFrEF exacerbation ? ?Discharge Diagnoses/Problem List:  ?HFrEF exacerbation ?Mitral valve regurgitation ?Acute on chronic renal insufficiency ?Peripheral arterial disease ?Right carotid artery occlusion ?Coronary artery disease status post CABG x4 ?Atrial fibrillation ?Hyperlipidemia ?Hypertension ? ?Disposition: home ? ?Discharge Condition: stable ? ?Discharge Exam: Blood pressure (!) 151/60, pulse (!) 102, temperature 98.4 ?F (36.9 ?C), temperature source Oral, resp. rate 18, height 5\' 2"  (1.575 m), weight 58.4 kg, SpO2 95 %. ? ?General: Resting comfortably, NAD ?Cardiovascular: Normal rate, irregular rhythm ?Respiratory: CTAB ?Abdomen: soft, nontender ?Extremities: warm, dry. Mild pitting edema in left foot, much improved BLE edema ?Examination by Dr. France Ravens ? ?Brief Hospital Course:  ?[name] [name] is a 81 y.o. male who was admitted to the [place] [facility] for BLE swelling and SOB. Hospital course is outlined below by system.  ? ?Acute on chronic heart failure with reduce EF ?Patient presented fluid overloaded with significant right pleural effusion.  IR performed right thoracentesis which yielded 1.3 L.  Patient was diuresed with IV Lasix 40 twice daily until patient was more euvolemic.  Creatinine did increase progressively throughout hospitalization likely secondary to aggressive diuresis.  Patient's other medications that were potentially nephrotoxic were held in the setting of increased creatinine including spironolactone, ARB.  Coreg was decreased down to 3.125 BID with meals.  Cardiology  was consulted as well as heart failure team this admission.  Patient was started on milrinone in order to continue diuresis. Patient had cumulative net output 12.2 L, weight 58 kg at time of discharge.  Patient had a LHC/RHC, which showed severe native 3 VD CAD, 4 of 4 grafts patent, mild PAH, mitral regurgitation.  Patient was recommended to continue medical therapy.  Patient also had a TEE which showed patient would qualify for a mitral clipping for his mitral regurgitation which will be further worked up outpatient. ? ?AKI on CKD 3b ?Patient's creatinine increased up to 2.08 during this admission.  This is likely in the setting of aggressive diuresis and cardiorenal syndrome.  Patient was started on milrinone in order to continue diuresis which allowed for a stable creatinine around 1.7.  Patient's baseline creatinine appears to be around 1.4. ? ?Atrial Fibrillation  ?Patient's heart rate very rarely exceeded 100 bpm throughout admission.  Patient was also counseled on use of DOAC for atrial fibrillation; however, patient refused both Eliquis and Coumadin due to bleeding risk and cost. ? ?Severe PAD  H/o CABG x4  Ischemic Cardiomyopathy  ?Hx of 60% carotid occlusion s/p left CEA ?Right Carotid artery occlusion  ?Patient initially came in due to severe lower extremity progressive worsening edema as well as some upper extremity edema.  ABI was performed which showed moderate arterial disease in right lower extremity and no discernible waveform in LLE. Edema did progressively reduce with IV diuresis.  VVS was consulted which recommended outpatient follow-up and possible outpatient arteriogram.  ? ?HTN ?Patient was normotensive or hypotensive during hospitalization so in context of patient's elevated creatinine, held his home ARB, Aldactone and reduce Coreg significantly. ? ?Other chronic conditions were medically managed with home medications and formulary alternatives as necessary (Macrocytosis, HLD,) ? ?PCP  Follow-up Recommendations: ?BMP to assess  creatinine, electrolytes ?CBC to assess Hgb ?Assess volume status ?Assess medication compliance ? ?Significant Procedures: 03/24/22 TEE, LHC/RHC ? ?Significant Labs and Imaging:  ?Recent Labs  ?Lab 03/19/22 ?0506 03/23/22 ?0256 03/24/22 ?0406 03/24/22 ?1032 03/24/22 ?1037 03/24/22 ?1105  ?WBC 6.3 7.7 7.5  --   --   --   ?HGB 12.6* 13.5 14.2 12.2* 13.3  13.6 12.9*  ?HCT 37.8* 38.7* 40.9 36.0* 39.0  40.0 38.0*  ?PLT 134* 151 158  --   --   --   ? ?Recent Labs  ?Lab 03/19/22 ?0506 03/20/22 ?0300 03/21/22 ?[id number] 03/22/22 ?[id number] 03/23/22 ?0256 03/24/22 ?0406 03/24/22 ?1032 03/24/22 ?1037 03/24/22 ?1105 03/25/22 ?0904  ?NA 140 138 135 135 132* 134* 136 133*  132* 135 135  ?K 3.6 4.7 4.0 4.6 4.4 4.7 4.1 4.5  4.6 4.2 5.0  ?CL 110 105 102 98 95* 95*  --   --   --  100  ?CO2 24 29 25 31 30  35*  --   --   --  27  ?GLUCOSE 81 97 82 89 102* 105*  --   --   --  137*  ?BUN 33* 40* 41* 41* 41* 37*  --   --   --  33*  ?CREATININE 1.78* 2.08* 1.70* 1.71* 1.85* 1.60*  --   --   --  1.58*  ?CALCIUM 9.8 10.2 9.8 9.8 9.6 10.0  --   --   --  10.2  ?MG 1.8 2.2  --  2.1  --   --   --   --   --   --   ? ? ?Results/Tests Pending at Time of Discharge: none ? ?Discharge Medications:  ?Allergies as of 03/25/2022   ?No Known Allergies ?  ? ?  ?Medication List  ?  ? ?STOP taking these medications   ? ?aspirin 81 MG tablet ?Replaced by: aspirin 81 MG chewable tablet ?  ?magnesium 30 MG tablet ?  ?pantoprazole 40 MG tablet ?Commonly known as: PROTONIX ?  ?spironolactone 25 MG tablet ?Commonly known as: ALDACTONE ?  ?valsartan 160 MG tablet ?Commonly known as: DIOVAN ?  ?vitamin C 1000 MG tablet ?  ? ?  ? ?TAKE these medications   ? ?aspirin 81 MG chewable tablet ?Chew 1 tablet (81 mg total) by mouth daily. ?Start taking on: March 26, 2022 ?Replaces: aspirin 81 MG tablet ?  ?atorvastatin 80 MG tablet ?Commonly known as: LIPITOR ?Take 1 tablet (80 mg total) by mouth daily at 6 PM. ?  ?carvedilol 3.125 MG  tablet ?Commonly known as: COREG ?Take 8 tablets (25 mg total) by mouth in the morning and at bedtime. ?What changed: medication strength ?  ?clopidogrel 75 MG tablet ?Commonly known as: PLAVIX ?Take 1 tablet (75 mg total) by mouth daily. ?Start taking on: March 26, 2022 ?What changed:  ?how much to take ?how to take this ?when to take this ?additional instructions ?  ?furosemide 40 MG tablet ?Commonly known as: LASIX ?Take 1 tablet (40 mg total) by mouth 2 (two) times daily. ?Start taking on: March 26, 2022 ?What changed:  ?medication strength ?how much to take ?when to take this ?  ?potassium chloride SA 20 MEQ tablet ?Commonly known as: KLOR-CON M ?Take 1 tablet (20 mEq total) by mouth daily. ?  ? ?  ? ? ?Discharge Instructions: Please refer to Patient Instructions section of EMR for full details.  Patient was counseled important signs and symptoms that should prompt return to medical care, changes  in medications, dietary instructions, activity restrictions, and follow up appointments.  ? ?Follow-Up Appointments: ? Follow-up Information   ? ? [facility]. Go in 12 day(s).   ?Specialty: Cardiology ?Why: Heart Failure TOC appt. 04/01/2022 @ 10 am ?Please bring all medications with you ?Free Valet at Entrance C. ?Contact information: ?[address] ?[id number] mc ?[place] ?[phone] ? ?  ?  ? ?  ?  ? ?  ? ? ?Erskine Emery, MD ?03/25/2022, 2:46 PM ?PGY-1, [place] ? ?FPTS Upper-Level Resident Addendum ?  ?I have independently interviewed and examined the patient. I have discussed the above with the original author and agree with their documentation. My edits for correction/addition/clarification are included where appropriate. Please see also any attending notes.  ? ?Sharion Settler, DO ?PGY-2, [facility] ?03/25/2022 5:55 PM  ?[facility] Service pager: [phone] (text pages welcome through [facility]) ? ?

## 2022-03-25 NOTE — Progress Notes (Signed)
PT Cancellation Note ? ?Patient Details ?Name: [name] [name] ?MRN: [id number] ?DOB: [birth year] ? ? ?Cancelled Treatment:    Reason Eval/Treat Not Completed: Patient declined, no reason specified. Pt states he is eager to return home soon and therefore does not need or want any additional therapy at this time. Pt states all needs are met, no concerns about going home, and was agreeable to education regarding 2-3 walks each day with progressive increases in time as well as education regarding elevation of BLE outside of activity sessions. Pt and his wife agreeable with no further questions.  ? ?West Carbo, PT, DPT  ? ?Acute Rehabilitation Department ?Pager #: [phone] ? ? ?Sandra Cockayne ?03/25/2022, 10:29 AM ?

## 2022-03-25 NOTE — Anesthesia Postprocedure Evaluation (Signed)
Anesthesia Post Note ? ?Patient: [name] [name] ? ?Procedure(s) Performed: TRANSESOPHAGEAL ECHOCARDIOGRAM (TEE) ? ?  ? ?Patient location during evaluation: PACU ?Anesthesia Type: MAC ?Level of consciousness: awake and alert ?Pain management: pain level controlled ?Vital Signs Assessment: post-procedure vital signs reviewed and stable ?Respiratory status: spontaneous breathing ?Cardiovascular status: stable ?Anesthetic complications: no ? ? ?No notable events documented. ? ?Last Vitals:  ?Vitals:  ? 03/24/22 2000 03/25/22 0330  ?BP: 90/73 (!) 151/60  ?Pulse: 94 100  ?Resp: 18 18  ?Temp: 37 ?C 37.3 ?C  ?SpO2: 95% 95%  ?  ?Last Pain:  ?Vitals:  ? 03/24/22 2247  ?TempSrc:   ?PainSc: 0-No pain  ? ? ?  ?  ?  ?  ?  ?  ? ?Nolon Nations ? ? ? ? ?

## 2022-03-25 NOTE — Progress Notes (Signed)
OT Cancellation Note ? ?Patient Details ?Name: [name] [name] ?MRN: [id number] ?DOB: Jul 20, 1941 ? ? ?Cancelled Treatment:    Reason Eval/Treat Not Completed: Patient declined, no reason specified (Patient declined therapy stating he doesn't want anymore therapy and just wants to go home.) ? ?Alfonse Flavors, OTA ?Acute Rehabilitation Services  ?Pager [phone] ?Office [phone] ? ?Camisha Srey Jeannett Senior ?03/25/2022, 1:44 PM ?

## 2022-03-25 NOTE — Progress Notes (Signed)
FPTS Interim Progress Note ? ?S:Went to bedside to check on patient, he was asleep so I did not wake him. ? ?O: ?BP 90/73   Pulse 94   Temp 98.6 ?F (37 ?C) (Oral)   Resp 18   Ht 5\' 2"  (1.575 m)   Wt 59.4 kg   SpO2 95%   BMI 23.95 kg/m?   ?General: Patient sleeping comfortably, in no acute distress. ?Resp: normal work of breathing  ? ?A/P: ?Patient s/p RHC. failure team following, appreciate continued recommendations. Per Dr. Edwyna Shell, milrinone stopped tonight. Potential plan to switch to oral lasix in the morning. Vitals and orders reviewed. Remainder of plan per day team.  ? Gala Romney, DO ?03/25/2022, 1:15 AM ?PGY-2, [facility] ?Service pager [phone]  ?

## 2022-03-25 NOTE — Progress Notes (Addendum)
Family Medicine Teaching Service ?Daily Progress Note ?Intern Pager: [phone] ? ?Patient name: [name] [name] Medical record number: [id number] ?Date of birth: [birth year] Age: 81 y.o. Gender: male ? ?Primary Care Provider: Joycelyn Rua, MD ?Consultants: Cardiology, HF, IR, VVS ?Code Status: DNR ? ?Pt Overview and Major Events to Date:  ?4/4-admitted, r thoracentesis ?4/11-RHC ? ?Assessment and Plan: ?Stephone Mcclarnon is a 81 year old male presenting with acute exacerbation of HFrEF, diuresing.  Past medical history significant for HFmrEF, CAD on aspirin and Plavix status post CABG x4, PAD, A-fib, hypertension, hyperlipidemia, CKD stage III ? ?Decompensated biventricular heart failure  HFrEF ?UOP 1.3 L, Net output 0.5 L, weight 58.4 kg from 63.2 kg on admission. ?-HF following, greatly appreciate recs ?-Milrinone discontinued ?-Start oral Lasix 40 mg BID tomorrow ?-Start klorcon 40 meq tomorrow ?-Strict I/O w/ daily weights ?-TED hose ? ?Acute on chronic renal insufficiency ?Cr 1.7>1.58. Baseline Cr ~1.4 ? ?Severe PAD ?CAD s/p CABG x4 ?Right carotid artery occlusion ?-Continue ASA+Plavix ?-Elevated BLE with 1-2 pillows ?-Outpatient VVS follow-up ? ?Permanent Afib, hx of LAA clipping  ?-Rate controlled  ? ?Goals of care ?Patient agreeable to DNR.  ?-Palliative consult, appreciate recs ? ?Hyperlipidemia ?-Continue home lipitor 80 mg daily ?  ?Hypertension ?BP 144/65 ?-Continue to hold aldactone upon d/c ?-Resume coreg 3.125 mg BID with meals ? ?FEN/GI: Heart Healthy ?PPx: Lovenox ?Dispo:Home tomorrow. Barriers include ongoing cardiac workup.  ? ?Subjective:  ?Patient insistent on leaving today. Wants to eat breakfast at home. ? ?Objective: ?Temp:  [97.5 ?F (36.4 ?C)-99.2 ?F (37.3 ?C)] 99.2 ?F (37.3 ?C) (04/12 0330) ?Pulse Rate:  [65-100] 100 (04/12 0330) ?Resp:  [16-20] 18 (04/12 0330) ?BP: (90-151)/(50-73) 151/60 (04/12 0330) ?SpO2:  [95 %-100 %] 95 % (04/12 0330) ?Weight:  [58.4 kg-59.4 kg] 58.4 kg (04/12 0330) ?Physical  Exam: ?General: Resting comfortably, NAD ?Cardiovascular: Normal rate, irregular rhythm ?Respiratory: CTAB ?Abdomen: soft, nontender ?Extremities: warm, dry. Mild pitting edema in left foot, much improved BLE edema ? ?Laboratory: ?Recent Labs  ?Lab 03/19/22 ?0506 03/23/22 ?0256 03/24/22 ?0406 03/24/22 ?1032 03/24/22 ?1037 03/24/22 ?1105  ?WBC 6.3 7.7 7.5  --   --   --   ?HGB 12.6* 13.5 14.2 12.2* 13.3  13.6 12.9*  ?HCT 37.8* 38.7* 40.9 36.0* 39.0  40.0 38.0*  ?PLT 134* 151 158  --   --   --   ? ?Recent Labs  ?Lab 03/22/22 ?0177 03/23/22 ?0256 03/24/22 ?0406 03/24/22 ?1032 03/24/22 ?1037 03/24/22 ?1105  ?NA 135 132* 134* 136 133*  132* 135  ?K 4.6 4.4 4.7 4.1 4.5  4.6 4.2  ?CL 98 95* 95*  --   --   --   ?CO2 31 30 35*  --   --   --   ?BUN 41* 41* 37*  --   --   --   ?CREATININE 1.71* 1.85* 1.60*  --   --   --   ?CALCIUM 9.8 9.6 10.0  --   --   --   ?GLUCOSE 89 102* 105*  --   --   --   ? ? ? ? ?Imaging/Diagnostic Tests: ?ECHO TEE ? ?Result Date: 03/24/2022 ?   TRANSESOPHOGEAL ECHO REPORT   Patient Name:   HAMID HASER Date of Exam: 03/24/2022 Medical Rec #:  [id number]   Height:       62.0 in Accession #:    [id number]  Weight:       131.0 lb Date of Birth:  28-May-1941   BSA:  1.597 m? Patient Age:    45 years    BP:           110/72 mmHg Patient Gender: M           HR:           72 bpm. Exam Location:  Inpatient Procedure: Transesophageal Echo, Color Doppler, Cardiac Doppler and 3D Echo                                 MODIFIED REPORT:  This report was modified by Sanda Klein MD on 03/24/2022 due to Mitral valve                                 gradient added.  Indications:     Mitral Regurgitation  History:         Patient has prior history of Echocardiogram examinations, most                  recent 03/17/2022. Arrythmias:Atrial Fibrillation.  Sonographer:     Bernadene Person RDCS Referring Phys:  [id number] Wilmer Floor SIMMONS Diagnosing Phys: Sanda Klein MD PROCEDURE: After discussion of the risks and benefits  of a TEE, an informed consent was obtained from the patient. The transesophogeal probe was passed without difficulty through the esophogus of the patient. Sedation performed by different physician. The patient was monitored while under deep sedation. Anesthestetic sedation was provided intravenously by Anesthesiology: 112.86mg  of Propofol, 100mg  of Lidocaine. The patient's vital signs; including heart rate, blood pressure, and oxygen saturation; remained stable throughout the procedure. The patient developed no complications during the procedure. IMPRESSIONS  1. Left ventricular ejection fraction, by estimation, is 30 to 35%. The left ventricle has moderately decreased function. The left ventricle demonstrates regional wall motion abnormalities (see scoring diagram/findings for description). The left ventricular internal cavity size was moderately dilated. There is the interventricular septum is flattened in diastole ('D' shaped left ventricle), consistent with right ventricular volume overload.  2. Right ventricular systolic function is severely reduced. The right ventricular size is mildly enlarged. There is moderately elevated pulmonary artery systolic pressure.  3. The left atrial appendage is surgically clipped. A small amputated shallow stump remains. Left atrial size was severely dilated. No left atrial/left atrial appendage thrombus was detected.  4. Right atrial size was severely dilated.  5. The mitral valve is normal in structure. Moderate to severe mitral valve regurgitation.  6. The tricuspid valve is abnormal. Tricuspid valve regurgitation is severe.  7. The aortic valve is tricuspid. There is mild thickening of the aortic valve. Aortic valve regurgitation is mild. Aortic valve sclerosis is present, with no evidence of aortic valve stenosis.  8. There is mild (Grade II) layered plaque involving the descending aorta.  9. There is absent systolic flow in the left upper pulmonary vein. There is markedly  restricted diastolic venous flow bilaterally, consistent with markedly elevated filling pressures. Comparison(s): Left ventricular systolic function appears better than on the recent transthoracic echo (on IV milrinone). On both studies there are Doppler findings of low cardiac output and restrictive filling. FINDINGS  Left Ventricle: Left ventricular ejection fraction, by estimation, is 30 to 35%. The left ventricle has moderately decreased function. The left ventricle demonstrates regional wall motion abnormalities. The left ventricular internal cavity size was moderately dilated. There is no left ventricular hypertrophy. The interventricular septum is  flattened in diastole ('D' shaped left ventricle), consistent with right ventricular volume overload. Right Ventricle: The right ventricular size is mildly enlarged. No increase in right ventricular wall thickness. Right ventricular systolic function is severely reduced. There is moderately elevated pulmonary artery systolic pressure. The tricuspid regurgitant velocity is 3.41 m/s, and with an assumed right atrial pressure of 10 mmHg, the estimated right ventricular systolic pressure is 123XX123 mmHg. Left Atrium: The left atrial appendage is surgically clipped. A small amputated shallow stump remains. Left atrial size was severely dilated. No left atrial/left atrial appendage thrombus was detected. Right Atrium: Right atrial size was severely dilated. Pericardium: Trivial pericardial effusion is present. Mitral Valve: Posterior leaflet motion is restricted due to left ventricular remodeling/tethering. Mitral valve area is 5.8 cm sq by planimetry. Mean mitral valve gradient is 2 mm Hg. Posterior leaflet length is 16 mm. The regurgitant jet Regurgitant effective orifice area by PISA method (limited accuracy due to atrial fibrillation) is 0.35 cm sq, regurgitant volume 65 ml, regurgitant fraction 63%. The mitral valve is normal in structure. Moderate to severe mitral  valve regurgitation, with centrally-directed jet. Mitral valve area by planimetry is 5.83 cm?, and by the pressure half-time method is calculated to be 8.38 cm?Marland Kitchen The mean transmitral gradient is 2.4 mmHg. The me

## 2022-03-25 NOTE — Progress Notes (Addendum)
? ? Advanced Heart Failure Rounding Note ? ?PCP-Cardiologist: Shelva Majestic, MD  ? ?Subjective:   ?Admit weight 146-->130  ?4/11 S/P TEE RHC/LHC.  Milrinone stopped.  ? ?TEE- Moderate to severe mitral insufficiency, mostly at A2-P2.Continue work up for mitral clip.  ? ?RHC/LHC On milrinone 0.125 mcg/kg/min ? Ao = 158/64 (98) ?LV = 159/10 ?RA = 5 ?RV = 49/5 ?PA = 52/16 (13) ?PCW = 16 (v = 25) ?Fick cardiac output/index = 4.6/2.9 ?PVR = 3.7 WU ?FA sat = 99% ?PA sat = 73%, 75% ?SVC 74%  ?Assessment: ?1. Severe native 3v CAD ?2. 4/4 grafts patent ?3. Mild PAH  ?4. Relatively well compensated L-sided pressures and cardiac output on milrinone ?5. Prominent v-wave (25) in PCWP tracing c/w known MR ? ?Wants to go home.  ? ? ?Objective:   ?Weight Range: ?58.4 kg ?Body mass index is 23.56 kg/m?.  ? ?Vital Signs:   ?Temp:  [97.5 ?F (36.4 ?C)-99.2 ?F (37.3 ?C)] 99.2 ?F (37.3 ?C) (04/12 0330) ?Pulse Rate:  [65-100] 100 (04/12 0330) ?Resp:  [16-20] 18 (04/12 0330) ?BP: (90-151)/(50-73) 151/60 (04/12 0330) ?SpO2:  [95 %-100 %] 95 % (04/12 0330) ?Weight:  [58.4 kg-59.4 kg] 58.4 kg (04/12 0330) ?Last BM Date : 03/24/22 ? ?Weight change: ?Filed Weights  ? 03/24/22 0348 03/24/22 1202 03/25/22 0330  ?Weight: 59.4 kg 59.4 kg 58.4 kg  ? ? ?Intake/Output:  ? ?Intake/Output Summary (Last 24 hours) at 03/25/2022 0825 ?Last data filed at 03/25/2022 0335 ?Gross per 24 hour  ?Intake 800 ml  ?Output 1300 ml  ?Net -500 ml  ?  ? ? ?Physical Exam  ? General: . No resp difficulty ?HEENT: normal ?Neck: supple. no JVD. Carotids 2+ bilat; no bruits. No lymphadenopathy or thryomegaly appreciated. ?Cor: PMI nondisplaced. Irregular rate & rhythm. No rubs, gallops . 3/6 MR/TR ?Lungs: clear ?Abdomen: soft, nontender, nondistended. No hepatosplenomegaly. No bruits or masses. Good bowel sounds. ?Extremities: no cyanosis, clubbing, rash, edema ?Neuro: alert & orientedx3, cranial nerves grossly intact. moves all 4 extremities w/o difficulty. Affect  pleasant ? ?Telemetry  ? ?A fib 70s  ? ?EKG  ?  ?No new EKG to review  ? ?Labs  ?  ?CBC ?Recent Labs  ?  03/23/22 ?0256 03/24/22 ?0406 03/24/22 ?1032 03/24/22 ?1037 03/24/22 ?1105  ?WBC 7.7 7.5  --   --   --   ?HGB 13.5 14.2   < > 13.3  13.6 12.9*  ?HCT 38.7* 40.9   < > 39.0  40.0 38.0*  ?MCV 102.4* 102.8*  --   --   --   ?PLT 151 158  --   --   --   ? < > = values in this interval not displayed.  ? ?Basic Metabolic Panel ?Recent Labs  ?  03/23/22 ?0256 03/24/22 ?0406 03/24/22 ?1032 03/24/22 ?1037 03/24/22 ?1105  ?NA 132* 134*   < > 133*  132* 135  ?K 4.4 4.7   < > 4.5  4.6 4.2  ?CL 95* 95*  --   --   --   ?CO2 30 35*  --   --   --   ?GLUCOSE 102* 105*  --   --   --   ?BUN 41* 37*  --   --   --   ?CREATININE 1.85* 1.60*  --   --   --   ?CALCIUM 9.6 10.0  --   --   --   ? < > = values in this interval not displayed.  ? ?  Liver Function Tests ?No results for input(s): AST, ALT, ALKPHOS, BILITOT, PROT, ALBUMIN in the last 72 hours. ?No results for input(s): LIPASE, AMYLASE in the last 72 hours. ?Cardiac Enzymes ?No results for input(s): CKTOTAL, CKMB, CKMBINDEX, TROPONINI in the last 72 hours. ? ?BNP: ?BNP (last 3 results) ?Recent Labs  ?  03/17/22 ?1034  ?BNP 714.2*  ? ? ?ProBNP (last 3 results) ?No results for input(s): PROBNP in the last 8760 hours. ? ? ?D-Dimer ?No results for input(s): DDIMER in the last 72 hours. ?Hemoglobin A1C ?No results for input(s): HGBA1C in the last 72 hours. ?Fasting Lipid Panel ?No results for input(s): CHOL, HDL, LDLCALC, TRIG, CHOLHDL, LDLDIRECT in the last 72 hours. ?Thyroid Function Tests ?No results for input(s): TSH, T4TOTAL, T3FREE, THYROIDAB in the last 72 hours. ? ?Invalid input(s): FREET3 ? ?Other results: ? ? ?Imaging  ? ? ?CARDIAC CATHETERIZATION ? ?Result Date: 03/24/2022 ?  Prox RCA to Mid RCA lesion is 100% stenosed.   Prox Cx to Mid Cx lesion is 50% stenosed.   2nd Mrg lesion is 100% stenosed.   Prox Graft to Mid Graft lesion is 30% stenosed.   Mid LAD lesion is 70%  stenosed.   2nd Diag lesion is 100% stenosed. Findings: On milrinone 0.125 mcg/kg/min Ao = 158/64 (98) LV = 159/10 RA = 5 RV = 49/5 PA = 52/16 (13) PCW = 16 (v = 25) Fick cardiac output/index = 4.6/2.9 PVR = 3.7 WU FA sat = 99% PA sat = 73%, 75% SVC 74% Assessment: 1. Severe native 3v CAD 2. 4/4 grafts patent 3. Mild PAH 4. Relatively well compensated L-sided pressures and cardiac output on milrinone 5. Prominent v-wave (25) in PCWP tracing c/w known MR Plan/Discussion: Medical therapy. Proceed with TEE to further evaluate MV. Glori Bickers, MD 5:08 PM ? ?ECHO TEE ? ?Result Date: 03/24/2022 ?   TRANSESOPHOGEAL ECHO REPORT   Patient Name:   [name] [name] Date of Exam: 03/24/2022 Medical Rec #:  [id number]   Height:       62.0 in Accession #:    [id number]  Weight:       131.0 lb Date of Birth:  06/21/1941   BSA:          1.597 m? Patient Age:    81 years    BP:           110/72 mmHg Patient Gender: M           HR:           72 bpm. Exam Location:  Inpatient Procedure: Transesophageal Echo, Color Doppler, Cardiac Doppler and 3D Echo                                 MODIFIED REPORT:  This report was modified by Sanda Klein MD on 03/24/2022 due to Mitral valve                                 gradient added.  Indications:     Mitral Regurgitation  History:         Patient has prior history of Echocardiogram examinations, most                  recent 03/17/2022. Arrythmias:Atrial Fibrillation.  Sonographer:     Bernadene Person RDCS Referring Phys:  [place] Diagnosing Phys: Sanda Klein MD  PROCEDURE: After discussion of the risks and benefits of a TEE, an informed consent was obtained from the patient. The transesophogeal probe was passed without difficulty through the esophogus of the patient. Sedation performed by different physician. The patient was monitored while under deep sedation. Anesthestetic sedation was provided intravenously by Anesthesiology: 112.86mg  of Propofol, 100mg  of Lidocaine. The  patient's vital signs; including heart rate, blood pressure, and oxygen saturation; remained stable throughout the procedure. The patient developed no complications during the procedure. IMPRESSIONS  1. Left ventricular ejection fraction, by estimation, is 30 to 35%. The left ventricle has moderately decreased function. The left ventricle demonstrates regional wall motion abnormalities (see scoring diagram/findings for description). The left ventricular internal cavity size was moderately dilated. There is the interventricular septum is flattened in diastole ('D' shaped left ventricle), consistent with right ventricular volume overload.  2. Right ventricular systolic function is severely reduced. The right ventricular size is mildly enlarged. There is moderately elevated pulmonary artery systolic pressure.  3. The left atrial appendage is surgically clipped. A small amputated shallow stump remains. Left atrial size was severely dilated. No left atrial/left atrial appendage thrombus was detected.  4. Right atrial size was severely dilated.  5. The mitral valve is normal in structure. Moderate to severe mitral valve regurgitation.  6. The tricuspid valve is abnormal. Tricuspid valve regurgitation is severe.  7. The aortic valve is tricuspid. There is mild thickening of the aortic valve. Aortic valve regurgitation is mild. Aortic valve sclerosis is present, with no evidence of aortic valve stenosis.  8. There is mild (Grade II) layered plaque involving the descending aorta.  9. There is absent systolic flow in the left upper pulmonary vein. There is markedly restricted diastolic venous flow bilaterally, consistent with markedly elevated filling pressures. Comparison(s): Left ventricular systolic function appears better than on the recent transthoracic echo (on IV milrinone). On both studies there are Doppler findings of low cardiac output and restrictive filling. FINDINGS  Left Ventricle: Left ventricular ejection  fraction, by estimation, is 30 to 35%. The left ventricle has moderately decreased function. The left ventricle demonstrates regional wall motion abnormalities. The left ventricular internal cavity size was moderately dilated. The

## 2022-03-25 NOTE — TOC Transition Note (Signed)
Transition of Care (TOC) - CM/SW Discharge Note ? ? ?Patient Details  ?Name: Jp Delaney ?MRN: [id number] ?Date of Birth: 04/17/1941 ? ?Transition of Care (TOC) CM/SW Contact:  ?Zenon Mayo, RN ?Phone Number: ?03/25/2022, 3:15 PM ? ? ?Clinical Narrative:    ?Patient is for dc today, he does not want [facility] services.  [facility] is at the bedside.  He has no other needs. TOC to fill meds.  ? ? ?  ?Barriers to Discharge: Continued Medical Work up ? ? ?Patient Goals and CMS Choice ?Patient states their goals for this hospitalization and ongoing recovery are:: return home ?CMS Medicare.gov Compare Post Acute Care list provided to:: Patient ?Choice offered to / list presented to : Patient ? ?Discharge Placement ?  ?           ?  ?  ?  ?  ? ?Discharge Plan and Services ?  ?Discharge Planning Services: CM Consult ?Post Acute Care Choice: Home Health          ?  ?DME Agency: NA ?  ?  ?  ?HH Arranged: Refused HH, PT ?  ?  ?  ?  ? ?Social Determinants of Health (SDOH) Interventions ?Food Insecurity Interventions: Intervention Not Indicated ?Financial Strain Interventions: Intervention Not Indicated ?Housing Interventions: Intervention Not Indicated ?Transportation Interventions: Intervention Not Indicated ? ? ?Readmission Risk Interventions ?   ? View : No data to display.  ?  ?  ?  ? ? ? ? ? ?

## 2022-03-26 ENCOUNTER — Telehealth: Payer: Self-pay | Admitting: Cardiology

## 2022-03-26 NOTE — Telephone Encounter (Signed)
Attempted to call the patient to set up OP structural visit with Dr. Excell Seltzer however there was no answer with no availability to leave a message. Looking to add him onto Dr. Earmon Phoenix schedule 04/24/22 at 1120am. Will attempt later today  ? ? ?Georgie Chard NP-C ?Structural Heart Team  ?Pager: [phone] ?Phone: [phone] ?  ?

## 2022-03-30 ENCOUNTER — Ambulatory Visit (HOSPITAL_COMMUNITY)
Admission: RE | Admit: 2022-03-30 | Discharge: 2022-03-30 | Disposition: A | Discharge status: Home / Self Care | Payer: Medicare HMO | Source: Ambulatory Visit | Attending: Cardiology | Admitting: Cardiology

## 2022-03-30 ENCOUNTER — Other Ambulatory Visit (HOSPITAL_COMMUNITY): Payer: Self-pay | Admitting: *Deleted

## 2022-03-30 DIAGNOSIS — I5022 Chronic systolic (congestive) heart failure: Secondary | ICD-10-CM

## 2022-03-30 LAB — BASIC METABOLIC PANEL
Anion gap: 5 (ref 5–15)
BUN: 37 mg/dL — ABNORMAL HIGH (ref 8–23)
CO2: 25 mmol/L (ref 22–32)
Calcium: 9.9 mg/dL (ref 8.9–10.3)
Chloride: 104 mmol/L (ref 98–111)
Creatinine, Ser: 1.38 mg/dL — ABNORMAL HIGH (ref 0.61–1.24)
GFR, Estimated: 51 mL/min — ABNORMAL LOW (ref >60)
Glucose, Bld: 93 mg/dL (ref 70–99)
Potassium: 4.3 mmol/L (ref 3.5–5.1)
Sodium: 134 mmol/L — ABNORMAL LOW (ref 135–145)

## 2022-03-30 MED ORDER — CARVEDILOL 25 MG PO TABS: 25.0000 mg | ORAL_TABLET | Freq: Two times a day (BID) | ORAL | 3 refills | Status: DC

## 2022-03-30 NOTE — Addendum Note (Signed)
Encounter addended by: Kerry Dory, CMA on: 03/30/2022 10:00 AM ? Actions taken: Pharmacy for encounter modified, Order list changed

## 2022-04-01 ENCOUNTER — Encounter (HOSPITAL_COMMUNITY): Payer: Medicare HMO

## 2022-04-01 ENCOUNTER — Ambulatory Visit (HOSPITAL_COMMUNITY)
Admit: 2022-04-01 | Discharge: 2022-04-01 | Disposition: A | Discharge status: Home / Self Care | Payer: Medicare HMO | Source: Ambulatory Visit | Attending: Cardiology | Admitting: Cardiology

## 2022-04-01 ENCOUNTER — Telehealth: Payer: Self-pay | Admitting: Pharmacist

## 2022-04-01 ENCOUNTER — Encounter (HOSPITAL_COMMUNITY): Payer: Self-pay

## 2022-04-01 VITALS — BP 102/58 | HR 68 | Wt 139.0 lb

## 2022-04-01 DIAGNOSIS — Z79899 Other long term (current) drug therapy: Secondary | ICD-10-CM | POA: Insufficient documentation

## 2022-04-01 DIAGNOSIS — E1122 Type 2 diabetes mellitus with diabetic chronic kidney disease: Secondary | ICD-10-CM | POA: Insufficient documentation

## 2022-04-01 DIAGNOSIS — E785 Hyperlipidemia, unspecified: Secondary | ICD-10-CM | POA: Diagnosis not present

## 2022-04-01 DIAGNOSIS — I2510 Atherosclerotic heart disease of native coronary artery without angina pectoris: Secondary | ICD-10-CM | POA: Insufficient documentation

## 2022-04-01 DIAGNOSIS — I4821 Permanent atrial fibrillation: Secondary | ICD-10-CM | POA: Insufficient documentation

## 2022-04-01 DIAGNOSIS — I255 Ischemic cardiomyopathy: Secondary | ICD-10-CM | POA: Insufficient documentation

## 2022-04-01 DIAGNOSIS — N1831 Chronic kidney disease, stage 3a: Secondary | ICD-10-CM | POA: Insufficient documentation

## 2022-04-01 DIAGNOSIS — I5082 Biventricular heart failure: Secondary | ICD-10-CM | POA: Insufficient documentation

## 2022-04-01 DIAGNOSIS — Z951 Presence of aortocoronary bypass graft: Secondary | ICD-10-CM | POA: Diagnosis not present

## 2022-04-01 DIAGNOSIS — I5022 Chronic systolic (congestive) heart failure: Secondary | ICD-10-CM | POA: Diagnosis not present

## 2022-04-01 DIAGNOSIS — Z7901 Long term (current) use of anticoagulants: Secondary | ICD-10-CM | POA: Diagnosis not present

## 2022-04-01 DIAGNOSIS — I130 Hypertensive heart and chronic kidney disease with heart failure and stage 1 through stage 4 chronic kidney disease, or unspecified chronic kidney disease: Secondary | ICD-10-CM | POA: Insufficient documentation

## 2022-04-01 DIAGNOSIS — I340 Nonrheumatic mitral (valve) insufficiency: Secondary | ICD-10-CM

## 2022-04-01 DIAGNOSIS — J90 Pleural effusion, not elsewhere classified: Secondary | ICD-10-CM | POA: Diagnosis not present

## 2022-04-01 DIAGNOSIS — I083 Combined rheumatic disorders of mitral, aortic and tricuspid valves: Secondary | ICD-10-CM | POA: Insufficient documentation

## 2022-04-01 LAB — BASIC METABOLIC PANEL
Anion gap: 5 (ref 5–15)
BUN: 35 mg/dL — ABNORMAL HIGH (ref 8–23)
CO2: 28 mmol/L (ref 22–32)
Calcium: 9.9 mg/dL (ref 8.9–10.3)
Chloride: 103 mmol/L (ref 98–111)
Creatinine, Ser: 1.34 mg/dL — ABNORMAL HIGH (ref 0.61–1.24)
GFR, Estimated: 53 mL/min — ABNORMAL LOW (ref >60)
Glucose, Bld: 98 mg/dL (ref 70–99)
Potassium: 4.3 mmol/L (ref 3.5–5.1)
Sodium: 136 mmol/L (ref 135–145)

## 2022-04-01 MED ORDER — FUROSEMIDE 40 MG PO TABS: ORAL_TABLET | ORAL | 6 refills | Status: DC

## 2022-04-01 MED ORDER — POTASSIUM CHLORIDE CRYS ER 20 MEQ PO TBCR: 40.0000 meq | EXTENDED_RELEASE_TABLET | Freq: Every day | ORAL | 6 refills | Status: DC

## 2022-04-01 MED ORDER — DAPAGLIFLOZIN PROPANEDIOL 10 MG PO TABS: 10.0000 mg | ORAL_TABLET | Freq: Every day | ORAL | 11 refills | Status: AC

## 2022-04-01 NOTE — Progress Notes (Signed)
?Advanced Heart Failure Clinic Note  ? ?Referring Physician: ?PCP: Orpah Melter, MD ?PCP-Cardiologist: Shelva Majestic, MD  ?AHF: Dr. Haroldine Laws  ?Structural Heart: Dr. Burt Knack  ? ?HPI: ?81 y/o male w/ h/o chronic systolic heart failure due to ischemic CM, CAD s/p CABG x 4, permanent atrial fibrillation, mitral regurgitation, HTN, HLD, PAD and Stage III CKD.  ?  ?CABG took place in 2017 by Dr. Prescott Gum w/ LIMA to LAD, SVG to OM, SVG to PDA, and SVG to diagonal + LAA clip and left CEA for severe carotid artery stenosis.  ?  ?He has had chronic Afib since at least 2017. Had cardioversion in 2017 but w/ early recurrence. Now rate controlled on ? blocker. No longer on a/c given h/o frequent nose bleeds. Has refused retrial.  ?  ?Echo from 09/17/2017 showed depression of LV function with an EF of 25-30% with diffuse hypocontractility and akinesis of the inferolateral wall.  He had grade 3 diastolic dysfunction.  There was moderate mitral regurgitation, severe LA dilation, moderate RA dilation, and moderate aortic insufficiency.  PA pressure was significant increased at 85 mmHg ?  ?Echo 6/21 showed recovery of EF 45-50% with inferior to inferolateral hypokinesis.  Severe biatrial enlargement.  Moderate to severe mitral regurgitation, mild to moderate tricuspid regurgitation and mild aortic insufficiency.  ?  ?He had been followed by Dr. Claiborne Billings, but unfortunately lost to f/u during the [place] pandemic but apparently getting refills for lasix, coreg and ARB through PCP.  ? ?Recently admitted 4/23 w/ a/c CHF w/ marked fluid overload and NYHA Class IIIb symptoms. Echo showed biventricular failure w/ severe MR/TR. LVEF 30-35%, mild LVH, D-shaped LV c/w RV volume overload, RV mildly enlarged w/ moderately reduced systolic function w/ severely elevated RVSP 66.0 mmHg. LA moderately moderately dilated, RA severely dilated, Mod AI, severe TR. ? ?Initially had poor response to IV Lasix w/ development of AKI, concerning for low  output. HF team consulted. He was placed on empiric milrinone and lasix gtt w/ improved diuresis and SCr.  ? ?TEE showed moderate to severe mitral insufficiency, mostly at A2-P2. ? ?RHC/LHC on milrinone 0.125 mcg/kg/min, demonstrated severe native vessel CAD, 4/4 patent grafts, mild PAH, relatively well compensated L-sided pressures and cardiac output on milrinone and Prominent v-wave (25) in PCWP tracing c/w known MR. Structural heart team was consulted and he is being considered for MitraClip.  ? ?He was weaned off milrinone and transitioned to oral lasix. Discharged home on 4/12. D/c wt 128 lb.  ? ?He presents today for post hospital f/u. Here w/ wife. Reports doing well symptomatically but has redeveloped b/l LE. He is not very active at baseline and rarely leaves his hose but denies exertional dyspnea ambulating around his home. No orthopnea/PND. 2+ b/l LE on exam. Wearing compression hoses. Reports full med compliance. Reports urinary response to PO lasix is not robust. BP 102/58. Denies dizziness. No orthostatic symptoms. Wt is up 10 lb at 138 lb. Most recent SCr down to 1.4. K 4.3  ? ?Unfortunately there was a medication error on his discharge instructions. We had recommended he take Coreg 3.125 mg bid. He was given Rx for 3.125 mg tablets but dosage instructions were written to take 25 mg bid (8 tablets twice daily).  ? ?He has clinic f/u w/ Dr. Burt Knack on 5/15 to further discuss MitraClip  ? ? ?Review of Systems: [y] = yes, [ ]  = no  ? ?General: Weight gain [ Y]; Weight loss [ ] ; Anorexia [ ] ; Fatigue [ ] ;  Fever [ ] ; Chills [ ] ; Weakness [ ]   ?Cardiac: Chest pain/pressure [ ] ; Resting SOB [ ] ; Exertional SOB [ Y]; Orthopnea [ ] ; Pedal Edema [Y ]; Palpitations [ ] ; Syncope [ ] ; Presyncope [ ] ; Paroxysmal nocturnal dyspnea[ ]   ?Pulmonary: Cough [ ] ; Wheezing[ ] ; Hemoptysis[ ] ; Sputum [ ] ; Snoring [ ]   ?GI: Vomiting[ ] ; Dysphagia[ ] ; Melena[ ] ; Hematochezia [ ] ; Heartburn[ ] ; Abdominal pain [ ] ; Constipation [  ]; Diarrhea [ ] ; BRBPR [ ]   ?GU: Hematuria[ ] ; Dysuria [ ] ; Nocturia[ ]   ?Vascular: Pain in legs with walking [ ] ; Pain in feet with lying flat [ ] ; Non-healing sores [ ] ; Stroke [ ] ; TIA [ ] ; Slurred speech [ ] ;  ?Neuro: Headaches[ ] ; Vertigo[ ] ; Seizures[ ] ; Paresthesias[ ] ;Blurred vision [ ] ; Diplopia [ ] ; Vision changes [ ]   ?Ortho/Skin: Arthritis [ ] ; Joint pain [ ] ; Muscle pain [ ] ; Joint swelling [ ] ; Back Pain [ ] ; Rash [ ]   ?Psych: Depression[ ] ; Anxiety[ ]   ?Heme: Bleeding problems [ ] ; Clotting disorders [ ] ; Anemia [ ]   ?Endocrine: Diabetes [ ] ; Thyroid dysfunction[ ]  ? ? ?Past Medical History:  ?Diagnosis Date  ? Atrial fibrillation ([place]) 03/21/2016  ? ? ?Current Outpatient Medications  ?Medication Sig Dispense Refill  ? aspirin 81 MG chewable tablet Chew 1 tablet (81 mg total) by mouth daily. 30 tablet 0  ? atorvastatin (LIPITOR) 80 MG tablet Take 1 tablet (80 mg total) by mouth daily at 6 PM. 90 tablet 0  ? carvedilol (COREG) 25 MG tablet Take 1 tablet (25 mg total) by mouth in the morning and at bedtime. 60 tablet 3  ? clopidogrel (PLAVIX) 75 MG tablet Take 1 tablet (75 mg total) by mouth daily. 30 tablet 0  ? furosemide (LASIX) 40 MG tablet Take 1 tablet (40 mg total) by mouth 2 (two) times daily. 60 tablet 0  ? potassium chloride SA (KLOR-CON M) 20 MEQ tablet Take 1 tablet (20 mEq total) by mouth daily. 60 tablet 0  ? ?No current facility-administered medications for this encounter.  ? ? ?No Known Allergies ? ?  ?Social History  ? ?Socioeconomic History  ? Marital status: Married  ?  Spouse name: Obryan Rosberg  ? Number of children: 2  ? Years of education: Not on file  ? Highest education level: High school graduate  ?Occupational History  ? 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 Smoking status: Former  ? Smokeless tobacco: Former  ? Tobacco comments:  ?  Quit 1994  ?Vaping Use  ? Vaping Use: Never used  ?Substance and Sexual Activity  ? Alcohol use: Never  ? Drug use: Not  Currently  ? Sexual activity: Not on file  ?Other Topics Concern  ? Not on file  ?Social History Narrative  ? Not on file  ? ?Social Determinants of Health  ? ?Financial Resource Strain: Low Risk   ? Difficulty of Paying Living Expenses: Not hard at all  ?Food Insecurity: No Food Insecurity  ? Worried About Charity fundraiser in the Last Year: Never true  ? Ran Out of Food in the Last Year: Never true  ?Transportation Needs: No Transportation Needs  ? Lack of Transportation (Medical): No  ? Lack of Transportation (Non-Medical): No  ?Physical Activity: Not on file  ?Stress: Not on file  ?Social Connections: Not on file  ?Intimate Partner Violence: Not on file  ? ? ? No family history  on file. ? ?Vitals:  ? 04/01/22 1036  ?BP: (!) 102/58  ?Pulse: 68  ?SpO2: 98%  ?Weight: 63 kg  ? ? ? ?PHYSICAL EXAM: ?General:  Well appearing elderly male. No respiratory difficulty ?HEENT: normal ?Neck: supple. JVD 8 cm. Carotids 2+ bilat; no bruits. No lymphadenopathy or thyromegaly appreciated. ?Cor: PMI nondisplaced. Irregularly irregular rhythm. 3/6 MR murmur  ?Lungs: clear ?Abdomen: soft, nontender, nondistended. No hepatosplenomegaly. No bruits or masses. Good bowel sounds. ?Extremities: no cyanosis, clubbing, rash, 2+ b/l LE edema + TED hoses  ?Neuro: alert & oriented x 3, cranial nerves grossly intact. moves all 4 extremities w/o difficulty. Affect pleasant. ? ?ECG: Not performed  ? ? ?ASSESSMENT & PLAN: ? ?1. Acute on Chronic Biventricular Heart Failure ?- Ischemic CM, severe MVCAD s/p CABG in 2017 ?- Echo 2017 EF 25-30%, RV normal  ?- Echo 2018 EF 20-25%, RV mildly reduced ?- Echo 2021 EF improved 45-50%, RV normal  ?- Echo 4/23 EF 30-35%, diffuse HK, RV moderately reduced>>Admit for NYHA Class IIIb w/ marked fluid overload c/b worsening renal function w/ diuresis. Started on empiric milrinone 0.125 to augment diuresis. RHC/LHC on milrinone showed severe 3V disesae patent grafts and preserved cardiac output w/ compensated  filling pressures   ?- He is fluid overloaded post hospital w/ 10 lb wt gain and 2+ b/l LEE. NYHA Class II ?- Add Farxiga 10 mg daily  ?- Increase Lasix to 60 mg qam/ 40 mg qpm + increase KCl to 40 MEq daily

## 2022-04-01 NOTE — Patient Instructions (Signed)
START Farxiga 10 mg, one tab daily ?INCREASE Lasix to 60 mg (one and one half tab) in the AM and 40 mg one tab in the PM ?INCREASE Potassium to 40 meq daily ? ?Labs today ?We will only contact you if something comes back abnormal or we need to make some changes. ?Otherwise no news is good news! ? ?Labs needed in 7-10 days ? ?Your physician recommends that you schedule a follow-up appointment in: 3-4 weeks  in the Advanced Practitioners (PA/NP) Clinic  ? ? ?Do the following things EVERYDAY: ?Weigh yourself in the morning before breakfast. Write it down and keep it in a log. ?Take your medicines as prescribed ?Eat low salt foods--Limit salt (sodium) to 2000 mg per day.  ?Stay as active as you can everyday ?Limit all fluids for the day to less than 2 liters ? ?At the Advanced Heart Failure Clinic, you and your health needs are our priority. As part of our continuing mission to provide you with exceptional heart care, we have created designated Provider Care Teams. These Care Teams include your primary Cardiologist (physician) and Advanced Practice Providers (APPs- Physician Assistants and Nurse Practitioners) who all work together to provide you with the care you need, when you need it.  ? ?You may see any of the following providers on your designated Care Team at your next follow up: ?Dr Arvilla Meres ?Dr Marca Ancona ?Tonye Becket, NP ?Robbie Lis, PA ?Jessica Milford,NP ?Anna Genre, PA ?Karle Plumber, PharmD ? ? ?Please be sure to bring in all your medications bottles to every appointment.  ? ? ?If you have any questions or concerns before your next appointment please send [place] a message through [facility] or call our office at [phone].   ? ?TO LEAVE A MESSAGE FOR THE NURSE SELECT OPTION 2, PLEASE LEAVE A MESSAGE INCLUDING: ?YOUR NAME ?DATE OF BIRTH ?CALL BACK NUMBER ?REASON FOR CALL**this is important as we prioritize the call backs ? ?YOU WILL RECEIVE A CALL BACK THE SAME DAY AS LONG AS YOU CALL BEFORE  4:00 PM ? ? ?

## 2022-04-02 DIAGNOSIS — I10 Essential (primary) hypertension: Secondary | ICD-10-CM | POA: Diagnosis not present

## 2022-04-02 DIAGNOSIS — I240 Acute coronary thrombosis not resulting in myocardial infarction: Secondary | ICD-10-CM | POA: Diagnosis not present

## 2022-04-02 DIAGNOSIS — I5042 Chronic combined systolic (congestive) and diastolic (congestive) heart failure: Secondary | ICD-10-CM | POA: Diagnosis not present

## 2022-04-02 DIAGNOSIS — I340 Nonrheumatic mitral (valve) insufficiency: Secondary | ICD-10-CM | POA: Diagnosis not present

## 2022-04-02 DIAGNOSIS — I4891 Unspecified atrial fibrillation: Secondary | ICD-10-CM | POA: Diagnosis not present

## 2022-04-02 DIAGNOSIS — N1831 Chronic kidney disease, stage 3a: Secondary | ICD-10-CM | POA: Diagnosis not present

## 2022-04-02 DIAGNOSIS — I25810 Atherosclerosis of coronary artery bypass graft(s) without angina pectoris: Secondary | ICD-10-CM | POA: Diagnosis not present

## 2022-04-02 DIAGNOSIS — E7800 Pure hypercholesterolemia, unspecified: Secondary | ICD-10-CM | POA: Diagnosis not present

## 2022-04-07 ENCOUNTER — Other Ambulatory Visit (HOSPITAL_COMMUNITY): Payer: Medicare HMO

## 2022-04-08 ENCOUNTER — Ambulatory Visit (HOSPITAL_COMMUNITY)
Admission: RE | Admit: 2022-04-08 | Discharge: 2022-04-08 | Disposition: A | Discharge status: Home / Self Care | Payer: Medicare HMO | Source: Ambulatory Visit | Attending: Internal Medicine | Admitting: Internal Medicine

## 2022-04-08 DIAGNOSIS — I5022 Chronic systolic (congestive) heart failure: Secondary | ICD-10-CM | POA: Insufficient documentation

## 2022-04-08 LAB — BASIC METABOLIC PANEL
Anion gap: 5 (ref 5–15)
BUN: 33 mg/dL — ABNORMAL HIGH (ref 8–23)
CO2: 30 mmol/L (ref 22–32)
Calcium: 10.1 mg/dL (ref 8.9–10.3)
Chloride: 102 mmol/L (ref 98–111)
Creatinine, Ser: 1.4 mg/dL — ABNORMAL HIGH (ref 0.61–1.24)
GFR, Estimated: 50 mL/min — ABNORMAL LOW (ref >60)
Glucose, Bld: 102 mg/dL — ABNORMAL HIGH (ref 70–99)
Potassium: 4.2 mmol/L (ref 3.5–5.1)
Sodium: 137 mmol/L (ref 135–145)

## 2022-04-10 ENCOUNTER — Telehealth: Payer: Self-pay | Admitting: Cardiology

## 2022-04-10 NOTE — Telephone Encounter (Signed)
Attempted to call the patient to confirm his appointment with Dr. Excell Seltzer on 04/27/22 however there was no answer with no availability to leave a message. Our team has attempted to call him multiple times.  ? ?Georgie Chard NP-C ?Structural Heart Team  ?Pager: [phone] ?Phone: [phone] ? ?

## 2022-04-13 NOTE — Telephone Encounter (Signed)
Attempted to reach the pt, but phone just rings and no voicemail available.  ?

## 2022-04-16 ENCOUNTER — Encounter: Payer: Self-pay | Admitting: Vascular Surgery

## 2022-04-16 ENCOUNTER — Ambulatory Visit: Payer: Medicare HMO | Admitting: Vascular Surgery

## 2022-04-16 ENCOUNTER — Telehealth: Payer: Self-pay

## 2022-04-16 VITALS — BP 154/76 | HR 73 | Temp 97.9°F | Resp 20 | Ht 62.0 in | Wt 136.0 lb

## 2022-04-16 DIAGNOSIS — I739 Peripheral vascular disease, unspecified: Secondary | ICD-10-CM

## 2022-04-16 NOTE — Progress Notes (Signed)
? ? ?REASON FOR VISIT:  ? ?Follow-up of peripheral arterial disease. ? ?MEDICAL ISSUES:  ? ?PERIPHERAL ARTERIAL DISEASE: This patient does have evidence of multilevel arterial occlusive disease but has reasonable Doppler signals in both feet.  The wound on the right leg is now healed.  When he was in the hospital he had an ABI of 63% on the right with a toe pressure 58 mmHg which suggest adequate circulation for healing.  ABI in the left was 48%.  I would not recommend an aggressive work-up unless he developed recurrent ulceration or rest pain.  I offered to see him back in 6 months for follow-up studies however it is very difficult for him to drive to [facility] so he would like to follow-up with his primary care physician and be referred back if anything changes.  I think this is reasonable.  For this reason I will see him as needed. ? ? ?HPI:  ? ?[name] [name] is a pleasant 81 y.o. male who I saw in consultation in the hospital on 03/17/2022 with peripheral arterial disease.  He was admitted with bilateral lower extremity swelling and pain.  This was felt to be secondary to an exacerbation of his congestive heart failure.  He had arterial studies done which showed evidence of significant peripheral arterial disease.  He began having thigh and calf claudication about 2 months prior to this.  He denied any history of rest pain or nonhealing ulcers.  Of note he has undergone previous coronary revascularization and vein was taken from the right leg.  He had evidence of multilevel arterial occlusive disease on exam.  However the pressing issue at that time was his congestive heart failure and leg swelling.  His claudication was stable.  He did have a small wound on his right leg but I thought this would likely improve as the swelling improved.  If not we would have to consider arteriography.  We did discuss the importance of leg elevation.  Fortunately he was not a smoker.  He was on aspirin, statin Plavix, and a  statin. ? ?Since I saw him last, he states that the swelling in both legs has improved significantly.  I do not get any history of claudication although his activity is fairly limited.  He denies any history of rest pain.  He had a small wound on his right leg which has healed at this point. ? ?Past Medical History:  ?Diagnosis Date  ? Atrial fibrillation (HCC) 03/21/2016  ? ? ?History reviewed. No pertinent family history. ? ?SOCIAL HISTORY: ?Social History  ? ?Tobacco Use  ? Smoking status: Former  ? Smokeless tobacco: Former  ? Tobacco comments:  ?  Quit 1994  ?Substance Use Topics  ? Alcohol use: Never  ? ? ?No Known Allergies ? ?Current Outpatient Medications  ?Medication Sig Dispense Refill  ? aspirin 81 MG chewable tablet Chew 1 tablet (81 mg total) by mouth daily. 30 tablet 0  ? atorvastatin (LIPITOR) 80 MG tablet Take 1 tablet (80 mg total) by mouth daily at 6 PM. 90 tablet 0  ? carvedilol (COREG) 25 MG tablet Take 1 tablet (25 mg total) by mouth in the morning and at bedtime. 60 tablet 3  ? clopidogrel (PLAVIX) 75 MG tablet Take 1 tablet (75 mg total) by mouth daily. 30 tablet 0  ? dapagliflozin propanediol (FARXIGA) 10 MG TABS tablet Take 1 tablet (10 mg total) by mouth daily before breakfast. 30 tablet 11  ? furosemide (LASIX) 40 MG tablet  Take 1.5 tablets (60 mg total) by mouth in the morning AND 1 tablet (40 mg total) every evening. 75 tablet 6  ? potassium chloride SA (KLOR-CON M) 20 MEQ tablet Take 2 tablets (40 mEq total) by mouth daily. 60 tablet 6  ? ?No current facility-administered medications for this visit.  ? ? ?REVIEW OF SYSTEMS:  ?[X]  denotes positive finding, [ ]  denotes negative finding ?Cardiac  Comments:  ?Chest pain or chest pressure:    ?Shortness of breath upon exertion:    ?Short of breath when lying flat:    ?Irregular heart rhythm:    ?    ?Vascular    ?Pain in calf, thigh, or hip brought on by ambulation:    ?Pain in feet at night that wakes you up from your sleep:     ?Blood  clot in your veins:    ?Leg swelling:  x   ?    ?Pulmonary    ?Oxygen at home:    ?Productive cough:     ?Wheezing:     ?    ?Neurologic    ?Sudden weakness in arms or legs:     ?Sudden numbness in arms or legs:     ?Sudden onset of difficulty speaking or slurred speech:    ?Temporary loss of vision in one eye:     ?Problems with dizziness:     ?    ?Gastrointestinal    ?Blood in stool:     ?Vomited blood:     ?    ?Genitourinary    ?Burning when urinating:     ?Blood in urine:    ?    ?Psychiatric    ?Major depression:     ?    ?Hematologic    ?Bleeding problems:    ?Problems with blood clotting too easily:    ?    ?Skin    ?Rashes or ulcers:    ?    ?Constitutional    ?Fever or chills:    ? ?PHYSICAL EXAM:  ? ?Vitals:  ? 04/16/22 1426  ?BP: (!) 154/76  ?Pulse: 73  ?Resp: 20  ?Temp: 97.9 ?F (36.6 ?C)  ?SpO2: 98%  ?Weight: 136 lb (61.7 kg)  ?Height: 5\' 2"  (1.575 m)  ? ? ?GENERAL: The patient is a well-nourished male, in no acute distress. The vital signs are documented above. ?CARDIAC: There is a regular rate and rhythm.  ?VASCULAR: I do not detect carotid bruits. ?He has diminished femoral pulses with hernias in both groins.  I cannot palpate pedal pulses.  He does have an anterior tibial dorsalis pedis signal bilaterally with the Doppler.  Because of his foot swelling it is difficult to assess the posterior tibial artery. ?He has significant bilateral lower extremity swelling. ? ? ? ? ?PULMONARY: There is good air exchange bilaterally without wheezing or rales. ?ABDOMEN: Soft and non-tender with normal pitched bowel sounds.  ?MUSCULOSKELETAL: There are no major deformities or cyanosis. ?NEUROLOGIC: No focal weakness or paresthesias are detected. ?SKIN: There are no ulcers or rashes noted. ?PSYCHIATRIC: The patient has a normal affect. ? ?DATA:   ? ?No new data ? ? ?[facility] ?Office [phone] ?

## 2022-04-16 NOTE — Telephone Encounter (Signed)
Per [employer] review of 03/24/2022 TEE: ? ?"For patient TV: This is secondary MR and does not require a surgical consult ? ?This looks like a clippable valve. The fossa looks approachable for transseptal puncture in the SAXB view. LA dimensions are large enough for device steering and straddle. The MR jet is broad based and centrally located. The posterior leaflet measures 1.57 cm in the 127 LVOT grasping view. Gradient measures 1 mmHg and MVA measures 6.09 cm2. I'd recommend starting with an NTW or XTW and assessing for gradient." ? ? ?

## 2022-04-16 NOTE — Telephone Encounter (Signed)
Attempted to call both patient's number and his wife's number. His rings and rings, her VM is full. ? ?Unable to leave message- will try again later. ?

## 2022-04-17 ENCOUNTER — Telehealth (HOSPITAL_COMMUNITY): Payer: Self-pay

## 2022-04-17 ENCOUNTER — Other Ambulatory Visit (HOSPITAL_COMMUNITY): Payer: Self-pay

## 2022-04-17 NOTE — Telephone Encounter (Signed)
Transitions of Care Pharmacy   Call attempted for a pharmacy transitions of care follow-up. HIPAA appropriate voicemail was left with call back information provided.   Call attempt #2. Will follow-up in 2-3 days.    

## 2022-04-20 ENCOUNTER — Telehealth (HOSPITAL_COMMUNITY): Payer: Self-pay

## 2022-04-20 ENCOUNTER — Other Ambulatory Visit (HOSPITAL_COMMUNITY): Payer: Self-pay

## 2022-04-20 NOTE — Telephone Encounter (Signed)
Transitions of Care Pharmacy  ° °Call attempted for a pharmacy transitions of care follow-up.  ° °Call attempt #3. °  °

## 2022-04-22 NOTE — Telephone Encounter (Signed)
Attempted to call. Hung up after several rings as no VM answered to leave message. ? ?Will try again later. ?

## 2022-04-27 ENCOUNTER — Ambulatory Visit: Payer: Medicare HMO | Admitting: Cardiovascular Disease

## 2022-04-27 ENCOUNTER — Encounter: Payer: Self-pay | Admitting: Cardiovascular Disease

## 2022-04-27 VITALS — BP 140/70 | HR 71 | Ht 62.0 in | Wt 133.4 lb

## 2022-04-27 DIAGNOSIS — I340 Nonrheumatic mitral (valve) insufficiency: Secondary | ICD-10-CM | POA: Diagnosis not present

## 2022-04-27 DIAGNOSIS — I5022 Chronic systolic (congestive) heart failure: Secondary | ICD-10-CM | POA: Diagnosis not present

## 2022-04-27 DIAGNOSIS — I4819 Other persistent atrial fibrillation: Secondary | ICD-10-CM | POA: Diagnosis not present

## 2022-04-27 MED ORDER — CLOPIDOGREL BISULFATE 75 MG PO TABS: 75.0000 mg | ORAL_TABLET | Freq: Every day | ORAL | 3 refills | Status: DC

## 2022-04-27 NOTE — Progress Notes (Signed)
?Advanced Heart Failure Clinic Note  ? ? ?PCP: Orpah Melter, MD ?PCP-Cardiologist: Shelva Majestic, MD  ?AHF: Dr. Haroldine Laws  ?Structural Heart: Dr. Burt Knack  ? ?HPI: ?[name] [name] 81 y.o. male w/ h/o chronic systolic heart failure due to ischemic CM, CAD s/p CABG x 4, permanent atrial fibrillation, mitral regurgitation, HTN, HLD, PAD and Stage III CKD.  ?  ?CABG took place in 2017 by Dr. Prescott Gum w/ LIMA to LAD, SVG to OM, SVG to PDA, and SVG to diagonal + LAA clip and left CEA for severe carotid artery stenosis.  ?  ?He has had chronic Afib since at least 2017. Had cardioversion in 2017 but w/ early recurrence. Now rate controlled on ? blocker. No longer on a/c given h/o frequent nose bleeds. Has refused retrial.  ?  ?Echo from 09/17/2017 showed depression of LV function with an EF of 25-30% with diffuse hypocontractility and akinesis of the inferolateral wall.  He had grade 3 diastolic dysfunction.  There was moderate mitral regurgitation, severe LA dilation, moderate RA dilation, and moderate aortic insufficiency.  PA pressure was significant increased at 85 mmHg ?  ?Echo 6/21 showed recovery of EF 45-50% with inferior to inferolateral hypokinesis.  Severe biatrial enlargement. Moderate to severe mitral regurgitation, mild to moderate tricuspid regurgitation and mild aortic insufficiency.  ?  ?He had been followed by Dr. Claiborne Billings, but unfortunately lost to f/u during the [place] pandemic but apparently getting refills for lasix, coreg and ARB through PCP.  ? ?Admitted 4/23 w/ a/c CHF. Echo showed biventricular failure w/ severe MR/TR. LVEF 30-35%, mild LVH, D-shaped LV c/w RV volume overload, RV mildly enlarged w/ moderately reduced systolic function w/ severely elevated RVSP 66.0 mmHg. LA moderately moderately dilated, RA severely dilated, Mod AI, severe TR. Had poor response to IV Lasix w/ development of AKI, concerning for low output. HF team consulted. He was placed on empiric milrinone and lasix gtt w/ improved  diuresis. TEE showed moderate to severe mitral insufficiency, mostly at A2-P2. Underwent RHC/LHC on milrinone 0.125 mcg/kg/min, demonstrated severe native vessel CAD, 4/4 patent grafts, mild PAH, relatively well compensated L-sided pressures and cardiac output on milrinone and Prominent v-wave (25) in PCWP tracing c/w known MR. Structural heart team was consulted and he is being considered for MitraClip. Drips weaned off, discharged home, weight 128 lbs. ? ?Post hospital follow up, volume overloaded, weight up 10 lbs (139 lbs). He had been taking 25 mg bid instead of 3.125 of Coreg. Farxiga added, Lasix increased to 60/40 and Coreg weaned to 12.5 bid. ? ?Today he returns for HF follow up with his wife. Saw Dr. Burt Knack this week and patient decided against MitraClip. Overall feeling fine. He is able to get around the house OK and push grocery cart without SOB if he takes his time. Continues with some LE swelling. Denies palpitations, abnormal bleeding, CP, dizziness, or PND/Orthopnea. Appetite ok. No fever or chills. Weight at home 130 pounds. Taking all medications. Continues to take carvedilol 25 mg bid, unclear if he reduced at last visit. ? ?Cardiac Studies: ? ?- TEE (4/23): EF 30-35%, septum flattened in diastole, RV severely reduced, moderate to severe mitral insufficiency, mostly at A2-P2, severe TR ? ?- R/LHC (4/23): on milrinone 0.125 mcg/kg/min ? ?  Prox RCA to Mid RCA lesion is 100% stenosed. ?  Prox Cx to Mid Cx lesion is 50% stenosed. ?  2nd Mrg lesion is 100% stenosed. ?  Prox Graft to Mid Graft lesion is 30% stenosed. ?  Mid  LAD lesion is 70% stenosed. ?  2nd Diag lesion is 100% stenosed. ?  ?Ao = 158/64 (98) ?LV = 159/10 ?RA = 5 ?RV = 49/5 ?PA = 52/16 (13) ?PCW = 16 (v = 25) ?Fick cardiac output/index = 4.6/2.9 ?PVR = 3.7 WU ?FA sat = 99% ?PA sat = 73%, 75% ?SVC 74% ?  ?1. Severe native 3v CAD ?2. 4/4 grafts patent ?3. Mild PAH  ?4. Relatively well compensated L-sided pressures and cardiac output on  milrinone ?5. Prominent v-wave (25) in PCWP tracing c/w known MR ?  ?Plan/Discussion:  ?Medical therapy. Proceed with TEE to further evaluate MV. ? ?- Echo (4/23): EF 30-35%, mild LVH, D-shaped LV c/w RV volume overload, RV mildly enlarged w/ moderately reduced systolic function w/ severely elevated RVSP 66.0 mmHg. LA moderately moderately dilated, RA severely dilated, Mod AI, severe TR.  ? ?- Echo (6/21): EF 45-50% ? ?- Echo (10/18): EF 25-30% ? ?Past Medical History:  ?Diagnosis Date  ? Atrial fibrillation ([place]) 03/21/2016  ? ?Current Outpatient Medications  ?Medication Sig Dispense Refill  ? aspirin 81 MG chewable tablet Chew 1 tablet (81 mg total) by mouth daily. 30 tablet 0  ? atorvastatin (LIPITOR) 80 MG tablet Take 1 tablet (80 mg total) by mouth daily at 6 PM. 90 tablet 0  ? carvedilol (COREG) 25 MG tablet Take 1 tablet (25 mg total) by mouth in the morning and at bedtime. 60 tablet 3  ? clopidogrel (PLAVIX) 75 MG tablet Take 1 tablet (75 mg total) by mouth daily. 90 tablet 3  ? dapagliflozin propanediol (FARXIGA) 10 MG TABS tablet Take 1 tablet (10 mg total) by mouth daily before breakfast. 30 tablet 11  ? furosemide (LASIX) 40 MG tablet Take 1.5 tablets (60 mg total) by mouth in the morning AND 1 tablet (40 mg total) every evening. 75 tablet 6  ? potassium chloride SA (KLOR-CON M) 20 MEQ tablet Take 2 tablets (40 mEq total) by mouth daily. 60 tablet 6  ? ?No current facility-administered medications for this encounter.  ? ?No Known Allergies ? ?Social History  ? ?Socioeconomic History  ? Marital status: Married  ?  Spouse name: [name]  ? Number of children: 2  ? Years of education: Not on file  ? Highest education level: High school graduate  ?Occupational History  ? 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 Smoking status: Former  ? Smokeless tobacco: Former  ? Tobacco comments:  ?  Quit 1994  ?Vaping Use  ? Vaping Use: Never used  ?Substance and Sexual Activity  ? Alcohol use:  Never  ? Drug use: Not Currently  ? Sexual activity: Not on file  ?Other Topics Concern  ? Not on file  ?Social History Narrative  ? Not on file  ? ?Social Determinants of Health  ? ?Financial Resource Strain: Low Risk   ? Difficulty of Paying Living Expenses: Not hard at all  ?Food Insecurity: No Food Insecurity  ? Worried About Charity fundraiser in the Last Year: Never true  ? Ran Out of Food in the Last Year: Never true  ?Transportation Needs: No Transportation Needs  ? Lack of Transportation (Medical): No  ? Lack of Transportation (Non-Medical): No  ?Physical Activity: Not on file  ?Stress: Not on file  ?Social Connections: Not on file  ?Intimate Partner Violence: Not on file  ? ? No family history on file. ? ?BP 116/82   Pulse 82   Wt  59.1 kg (130 lb 6.4 oz)   SpO2 98%   BMI 23.85 kg/m?  ? ?Wt Readings from Last 3 Encounters:  ?04/29/22 59.1 kg (130 lb 6.4 oz)  ?04/27/22 60.5 kg (133 lb 6.4 oz)  ?04/16/22 61.7 kg (136 lb)  ? ?PHYSICAL EXAM: ?General:  NAD. No resp difficulty, walked into clinic with cane ?HEENT: Normal ?Neck: Supple. + v waves. Carotids 2+ bilat; no bruits. No lymphadenopathy or thryomegaly appreciated. ?Cor: PMI nondisplaced. Irregular rate & rhythm. No rubs, gallops, 3/6 + MR ?Lungs: Clear ?Abdomen: Soft, nontender, nondistended. No hepatosplenomegaly. No bruits or masses. Good bowel sounds. ?Extremities: No cyanosis, clubbing, rash, 2+ BLE edema, TED hose on ?Neuro: Alert & oriented x 3, cranial nerves grossly intact. Moves all 4 extremities w/o difficulty. Affect pleasant. ? ?ASSESSMENT & PLAN: ?1. Chronic Biventricular Heart Failure ?- Ischemic CM, severe MVCAD s/p CABG in 2017 ?- Echo 2017 EF 25-30%, RV normal  ?- Echo 2018 EF 20-25%, RV mildly reduced ?- Echo 2021 EF improved 45-50%, RV normal  ?- Echo 4/23 EF 30-35%, diffuse HK, RV moderately reduced>>Admit for NYHA Class IIIb w/ marked fluid overload c/b worsening renal function w/ diuresis. Started on empiric milrinone 0.125 to  augment diuresis. RHC/LHC on milrinone showed severe 3V disesae patent grafts and preserved cardiac output w/ compensated filling pressures   ?- Improved NYHA II, he remains mildly volume up today, but weight b

## 2022-04-27 NOTE — Patient Instructions (Signed)
Medication Instructions:  ?Your physician recommends that you continue on your current medications as directed. Please refer to the Current Medication list given to you today. ? ?*If you need a refill on your cardiac medications before your next appointment, please call your pharmacy* ? ? ?Lab Work: ?NONE ?If you have labs (blood work) drawn today and your tests are completely normal, you will receive your results only by: ?MyChart Message (if you have MyChart) OR ?A paper copy in the mail ?If you have any lab test that is abnormal or we need to change your treatment, we will call you to review the results. ? ? ?Testing/Procedures: ?NONE ? ? ?Follow-Up: ?At [facility], you and your health needs are our priority.  As part of our continuing mission to provide you with exceptional heart care, we have created designated Provider Care Teams.  These Care Teams include your primary Cardiologist (physician) and Advanced Practice Providers (APPs -  Physician Assistants and Nurse Practitioners) who all work together to provide you with the care you need, when you need it. ? ?We recommend signing up for the patient portal called "MyChart".  Sign up information is provided on this After Visit Summary.  MyChart is used to connect with patients for Virtual Visits (Telemedicine).  Patients are able to view lab/test results, encounter notes, upcoming appointments, etc.  Non-urgent messages can be sent to your provider as well.   ?To learn more about what you can do with MyChart, go to [email or web address].   ? ?Your next appointment:   ?As Needed ? ?Provider:   ?Michael Cooper, MD ? ?  ? ?Important Information About Sugar ? ? ? ? ?  ?

## 2022-04-27 NOTE — Progress Notes (Signed)
The Cardiology Office Note:    Date:  05/01/2022   ID:  [name] [name], DOB [birth year], MRN [id number]  PCP:  Orpah Melter, MD   [facility] Providers Cardiologist:  Shelva Majestic, MD     Referring MD: Orpah Melter, MD   Chief Complaint  Patient presents with   Mitral Regurgitation   History of Present Illness:    [name] [name] is a 81 y.o. male presenting for follow-up evaluation of mitral regurgitation.  He has a very complex cardiovascular history with multivessel CABG, permanent atrial fibrillation, recent hospitalization with decompensated heart failure requiring inotropic therapy, and severe mitral regurgitation.  The patient is here with his wife today. I met him in the hospital at the time of his initial structural heart consultation. Please see that consultation note for complete details of his history. This is my first meeting with his wife. The patient is doing fairly well since his discharge from the hospital. He has mild dyspnea with exertion, but denies chest pain, orthopnea, or PND. He continues to have leg swelling.  Past Medical History:  Diagnosis Date   Atrial fibrillation ([place]) 03/21/2016    Past Surgical History:  Procedure Laterality Date   CARDIAC CATHETERIZATION N/A 03/04/2016   Procedure: Right/Left Heart Cath and Coronary Angiography;  Surgeon: Troy Sine, MD;  Location: [place] [facility];  Service: Cardiovascular;  Laterality: N/A;   CARDIOVERSION N/A 04/16/2016   Procedure: CARDIOVERSION;  Surgeon: Jerline Pain, MD;  Location: [facility] ENDOSCOPY;  Service: Cardiovascular;  Laterality: N/A;   CLIPPING OF ATRIAL APPENDAGE Left 03/10/2016   Procedure: CLIPPING OF ATRIAL APPENDAGE;  Surgeon: Ivin Poot, MD;  Location: [place];  Service: Open Heart Surgery;  Laterality: Left;   CORONARY ARTERY BYPASS GRAFT N/A 03/10/2016   Procedure: CORONARY ARTERY BYPASS GRAFTING (CABG)TIMES 4 USING LEFT INTERNAL MAMMARY AND BILATERAL GREATER SAPHENOUS VEIN HARVESTED BY  ENDOVIEN;  Surgeon: Ivin Poot, MD;  Location: [place];  Service: Open Heart Surgery;  Laterality: N/A;   ENDARTERECTOMY Left 03/10/2016   Procedure: LEFT ENDARTERECTOMY CAROTID DACRON PATCH ANGIOPLASTY;  Surgeon: Elam Dutch, MD;  Location: [place];  Service: Vascular;  Laterality: Left;   IR THORACENTESIS ASP PLEURAL SPACE W/IMG GUIDE  03/17/2022   RIGHT/LEFT HEART CATH AND CORONARY/GRAFT ANGIOGRAPHY N/A 03/24/2022   Procedure: RIGHT/LEFT HEART CATH AND CORONARY/GRAFT ANGIOGRAPHY;  Surgeon: Jolaine Artist, MD;  Location: [place] [facility];  Service: Cardiovascular;  Laterality: N/A;   TEE WITHOUT CARDIOVERSION N/A 03/10/2016   Procedure: TRANSESOPHAGEAL ECHOCARDIOGRAM (TEE);  Surgeon: Ivin Poot, MD;  Location: [place];  Service: Open Heart Surgery;  Laterality: N/A;   TEE WITHOUT CARDIOVERSION N/A 03/24/2022   Procedure: TRANSESOPHAGEAL ECHOCARDIOGRAM (TEE);  Surgeon: Sanda Klein, MD;  Location: [facility] ENDOSCOPY;  Service: Cardiovascular;  Laterality: N/A;    Current Medications: Current Meds  Medication Sig   aspirin 81 MG chewable tablet Chew 1 tablet (81 mg total) by mouth daily.   atorvastatin (LIPITOR) 80 MG tablet Take 1 tablet (80 mg total) by mouth daily at 6 PM.   dapagliflozin propanediol (FARXIGA) 10 MG TABS tablet Take 1 tablet (10 mg total) by mouth daily before breakfast.   [DISCONTINUED] carvedilol (COREG) 25 MG tablet Take 1 tablet (25 mg total) by mouth in the morning and at bedtime.   [DISCONTINUED] clopidogrel (PLAVIX) 75 MG tablet Take 1 tablet (75 mg total) by mouth daily.   [DISCONTINUED] furosemide (LASIX) 40 MG tablet Take 1.5 tablets (60 mg total) by mouth  in the morning AND 1 tablet (40 mg total) every evening.   [DISCONTINUED] potassium chloride SA (KLOR-CON M) 20 MEQ tablet Take 2 tablets (40 mEq total) by mouth daily.     Allergies:   Patient has no known allergies.   Social History   Socioeconomic History   Marital status: Married    Spouse  name: Lekendrick Alpern   Number of children: 2   Years of education: Not on file   Highest education level: High school graduate  Occupational History   Occupation: Retired    Comment: Architect  Tobacco Use   Smoking status: Former   Smokeless tobacco: Former   Tobacco comments:    Quit Neurosurgeon Use: Never used  Substance and Sexual Activity   Alcohol use: Never   Drug use: Not Currently   Sexual activity: Not on file  Other Topics Concern   Not on file  Social History Narrative   Not on file   Social Determinants of Health   Financial Resource Strain: Low Risk    Difficulty of Paying Living Expenses: Not hard at all  Food Insecurity: No Food Insecurity   Worried About Charity fundraiser in the Last Year: Never true   Arboriculturist in the Last Year: Never true  Transportation Needs: No Transportation Needs   Lack of Transportation (Medical): No   Lack of Transportation (Non-Medical): No  Physical Activity: Not on file  Stress: Not on file  Social Connections: Not on file     Family History: The patient's family history is not on file.  ROS:   Please see the history of present illness.    All other systems reviewed and are negative.  EKGs/Labs/Other Studies Reviewed:    The following studies were reviewed today: TEE: 1. Left ventricular ejection fraction, by estimation, is 30 to 35%. The  left ventricle has moderately decreased function. The left ventricle  demonstrates regional wall motion abnormalities (see scoring  diagram/findings for description). The left  ventricular internal cavity size was moderately dilated. There is the  interventricular septum is flattened in diastole ('D' shaped left  ventricle), consistent with right ventricular volume overload.   2. Right ventricular systolic function is severely reduced. The right  ventricular size is mildly enlarged. There is moderately elevated  pulmonary artery systolic pressure.   3. The  left atrial appendage is surgically clipped. A small amputated  shallow stump remains. Left atrial size was severely dilated. No left  atrial/left atrial appendage thrombus was detected.   4. Right atrial size was severely dilated.   5. The mitral valve is normal in structure. Moderate to severe mitral  valve regurgitation.   6. The tricuspid valve is abnormal. Tricuspid valve regurgitation is  severe.   7. The aortic valve is tricuspid. There is mild thickening of the aortic  valve. Aortic valve regurgitation is mild. Aortic valve sclerosis is  present, with no evidence of aortic valve stenosis.   8. There is mild (Grade II) layered plaque involving the descending  aorta.   9. There is absent systolic flow in the left upper pulmonary vein. There  is markedly restricted diastolic venous flow bilaterally, consistent with  markedly elevated filling pressures.   Comparison(s): Left ventricular systolic function appears better than on  the recent transthoracic echo (on IV milrinone). On both studies there are  Doppler findings of low cardiac output and restrictive filling.   Echo: 1. Left ventricular ejection fraction,  by estimation, is 30 to 35%. Left  ventricular ejection fraction by 3D volume is 26 %. The left ventricle has  moderately decreased function. The left ventricle demonstrates global  hypokinesis. There is mild  concentric left ventricular hypertrophy. Left ventricular diastolic  function could not be evaluated. There is the interventricular septum is  flattened in diastole ('D' shaped left ventricle), consistent with right  ventricular volume overload.   2. Right ventricular systolic function is moderately reduced. The right  ventricular size is mildly enlarged. Mildly increased right ventricular  wall thickness. There is severely elevated pulmonary artery systolic  pressure.   3. Left atrial size was moderately dilated.   4. Right atrial size was severely dilated.   5.  Large pleural effusion in the left lateral region.   6. There is mitral annular dilation and posterior leaflet tethering.  Effective regurgitant orifice area is 0.35 cm sq, regurgitant volume is 67  ml, regurgitant fraction is 61%. The mitral valve is abnormal. Severe  mitral valve regurgitation.   7. Tricuspid valve regurgitation is severe.   8. The aortic valve is tricuspid. There is mild thickening of the aortic  valve. Aortic valve regurgitation is moderate. Aortic valve  sclerosis/calcification is present, without any evidence of aortic  stenosis.   Comparison(s): Prior images reviewed side by side. There is worsening of  left ventricular dysfunction, mitral insufficiency, tricuspid  insufficiency and pulmonary hypertension.   Cardiac Cath: Prox RCA to Mid RCA lesion is 100% stenosed.   Prox Cx to Mid Cx lesion is 50% stenosed.   2nd Mrg lesion is 100% stenosed.   Prox Graft to Mid Graft lesion is 30% stenosed.   Mid LAD lesion is 70% stenosed.   2nd Diag lesion is 100% stenosed.   Findings:   On milrinone 0.125 mcg/kg/min   Ao = 158/64 (98) LV = 159/10 RA = 5 RV = 49/5 PA = 52/16 (13) PCW = 16 (v = 25) Fick cardiac output/index = 4.6/2.9 PVR = 3.7 WU FA sat = 99% PA sat = 73%, 75% SVC 74%   Assessment: 1. Severe native 3v CAD 2. 4/4 grafts patent 3. Mild PAH  4. Relatively well compensated L-sided pressures and cardiac output on milrinone 5. Prominent v-wave (25) in PCWP tracing c/w known MR   Plan/Discussion:    Medical therapy. Proceed with TEE to further evaluate MV.   Recent Labs: 03/17/2022: ALT 24 03/22/2022: Magnesium 2.1 03/24/2022: Hemoglobin 12.9; Platelets 158 04/29/2022: B Natriuretic Peptide 551.5; BUN 35; Creatinine, Ser 1.30; Potassium 4.8; Sodium 137  Recent Lipid Panel    Component Value Date/Time   CHOL 112 06/06/2020 1142   TRIG 75 06/06/2020 1142   HDL 51 06/06/2020 1142   CHOLHDL 2.2 06/06/2020 1142   CHOLHDL 4.5 03/04/2016 0400    VLDL 13 03/04/2016 0400   LDLCALC 46 06/06/2020 1142     Risk Assessment/Calculations:    CHA2DS2-VASc Score = 5   This indicates a 7.2% annual risk of stroke. The patient's score is based upon: CHF History: 1 HTN History: 1 Diabetes History: 0 Stroke History: 0 Vascular Disease History: 1 Age Score: 2 Gender Score: 0          Physical Exam:    VS:  BP 140/70   Pulse 71   Ht '5\' 2"'  (1.575 m)   Wt 133 lb 6.4 oz (60.5 kg)   SpO2 99%   BMI 24.40 kg/m     Wt Readings from Last 3 Encounters:  04/29/22  130 lb 6.4 oz (59.1 kg)  04/27/22 133 lb 6.4 oz (60.5 kg)  04/16/22 136 lb (61.7 kg)     GEN:  elderly male in no acute distress HEENT: Normal NECK: prominent V waves LYMPHATICS: No lymphadenopathy CARDIAC: irregularly irregular with a 3/6 holosystolic murmur along the LSB and apex RESPIRATORY:  Clear to auscultation without rales, wheezing or rhonchi  ABDOMEN: Soft, non-tender, non-distended MUSCULOSKELETAL:  2+ BL leg edema; No deformity  SKIN: Warm and dry NEUROLOGIC:  Alert and oriented x 3 PSYCHIATRIC:  Normal affect   ASSESSMENT:    1. Nonrheumatic mitral valve regurgitation   2. Chronic systolic heart failure (HCC)   3. Persistent atrial fibrillation (HCC)    PLAN:    In order of problems listed above:  Discussed natural history of severe MR in the context of the patient's chronic heart failure with reduced LVEF, ischemic heart disease, and other comorbid conditions. Reviewed data around medical therapy and mitral valve intervention with TEER. Reviewed the TEER procedure at length, including procedural risks, expectations, and typical recovery. Discussed potential benefit of TEER with respect to reduction in HF hospitalization, QOL, and mortality. MR etiology is mix of Carpentier Class I with dilated annulus and class 3B with restriction of the posterior leaflet. Functional anatomy is amenable to MitraClip. After our discussion and shared decision making, the  patient is clear that he does not wish to proceed. He states 'I think we will skip it.' I advised him that I am happy to see him in the future if he changes his mind. He has RV dysfunction, pulmonary HTN, and severe left-sided dysfunction. I suspect he is headed towards a palliative approach to his care.  As above, management per HF team Pt has refused [place] after having problems with epistaxis. He is not a candidate for LAA closure - too comorbid.      Medication Adjustments/Labs and Tests Ordered: Current medicines are reviewed at length with the patient today.  Concerns regarding medicines are outlined above.  No orders of the defined types were placed in this encounter.  Meds ordered this encounter  Medications   clopidogrel (PLAVIX) 75 MG tablet    Sig: Take 1 tablet (75 mg total) by mouth daily.    Dispense:  90 tablet    Refill:  3    Patient Instructions  Medication Instructions:  Your physician recommends that you continue on your current medications as directed. Please refer to the Current Medication list given to you today.  *If you need a refill on your cardiac medications before your next appointment, please call your pharmacy*   Lab Work: NONE If you have labs (blood work) drawn today and your tests are completely normal, you will receive your results only by: [place] (if you have MyChart) OR A paper copy in the mail If you have any lab test that is abnormal or we need to change your treatment, we will call you to review the results.   Testing/Procedures: NONE   Follow-Up: At [facility], you and your health needs are our priority.  As part of our continuing mission to provide you with exceptional heart care, we have created designated Provider Care Teams.  These Care Teams include your primary Cardiologist (physician) and Advanced Practice Providers (APPs -  Physician Assistants and Nurse Practitioners) who all work together to provide you with the care  you need, when you need it.  We recommend signing up for the patient portal called "MyChart".  Sign up  information is provided on this After Visit Summary.  MyChart is used to connect with patients for Virtual Visits (Telemedicine).  Patients are able to view lab/test results, encounter notes, upcoming appointments, etc.  Non-urgent messages can be sent to your provider as well.   To learn more about what you can do with MyChart, go to [email or web address].    Your next appointment:   As Needed  Provider:   Sherren Mocha, MD     Important Information About Sugar        Signed, Sherren Mocha, MD  05/01/2022 1:17 PM    [place]

## 2022-04-29 ENCOUNTER — Ambulatory Visit (HOSPITAL_COMMUNITY)
Admission: RE | Admit: 2022-04-29 | Discharge: 2022-04-29 | Disposition: A | Discharge status: Home / Self Care | Payer: Medicare HMO | Source: Ambulatory Visit | Attending: Family Medicine | Admitting: Family Medicine

## 2022-04-29 ENCOUNTER — Telehealth (HOSPITAL_COMMUNITY): Payer: Self-pay | Admitting: Surgery

## 2022-04-29 ENCOUNTER — Telehealth (HOSPITAL_COMMUNITY): Payer: Self-pay

## 2022-04-29 ENCOUNTER — Encounter (HOSPITAL_COMMUNITY): Payer: Self-pay

## 2022-04-29 VITALS — BP 116/82 | HR 82 | Wt 130.4 lb

## 2022-04-29 DIAGNOSIS — I071 Rheumatic tricuspid insufficiency: Secondary | ICD-10-CM

## 2022-04-29 DIAGNOSIS — I4821 Permanent atrial fibrillation: Secondary | ICD-10-CM | POA: Diagnosis not present

## 2022-04-29 DIAGNOSIS — I4819 Other persistent atrial fibrillation: Secondary | ICD-10-CM | POA: Diagnosis not present

## 2022-04-29 DIAGNOSIS — I5022 Chronic systolic (congestive) heart failure: Secondary | ICD-10-CM | POA: Diagnosis not present

## 2022-04-29 DIAGNOSIS — N183 Chronic kidney disease, stage 3 unspecified: Secondary | ICD-10-CM

## 2022-04-29 DIAGNOSIS — I255 Ischemic cardiomyopathy: Secondary | ICD-10-CM | POA: Insufficient documentation

## 2022-04-29 DIAGNOSIS — I2510 Atherosclerotic heart disease of native coronary artery without angina pectoris: Secondary | ICD-10-CM | POA: Insufficient documentation

## 2022-04-29 DIAGNOSIS — I5082 Biventricular heart failure: Secondary | ICD-10-CM | POA: Diagnosis not present

## 2022-04-29 DIAGNOSIS — I739 Peripheral vascular disease, unspecified: Secondary | ICD-10-CM

## 2022-04-29 DIAGNOSIS — I083 Combined rheumatic disorders of mitral, aortic and tricuspid valves: Secondary | ICD-10-CM | POA: Insufficient documentation

## 2022-04-29 DIAGNOSIS — Z951 Presence of aortocoronary bypass graft: Secondary | ICD-10-CM | POA: Insufficient documentation

## 2022-04-29 DIAGNOSIS — I130 Hypertensive heart and chronic kidney disease with heart failure and stage 1 through stage 4 chronic kidney disease, or unspecified chronic kidney disease: Secondary | ICD-10-CM | POA: Diagnosis not present

## 2022-04-29 DIAGNOSIS — I340 Nonrheumatic mitral (valve) insufficiency: Secondary | ICD-10-CM

## 2022-04-29 DIAGNOSIS — I25810 Atherosclerosis of coronary artery bypass graft(s) without angina pectoris: Secondary | ICD-10-CM

## 2022-04-29 DIAGNOSIS — N1831 Chronic kidney disease, stage 3a: Secondary | ICD-10-CM | POA: Diagnosis not present

## 2022-04-29 DIAGNOSIS — J90 Pleural effusion, not elsewhere classified: Secondary | ICD-10-CM

## 2022-04-29 DIAGNOSIS — E785 Hyperlipidemia, unspecified: Secondary | ICD-10-CM | POA: Insufficient documentation

## 2022-04-29 DIAGNOSIS — Z9889 Other specified postprocedural states: Secondary | ICD-10-CM | POA: Insufficient documentation

## 2022-04-29 LAB — BRAIN NATRIURETIC PEPTIDE: B Natriuretic Peptide: 551.5 pg/mL — ABNORMAL HIGH (ref 0.0–100.0)

## 2022-04-29 LAB — BASIC METABOLIC PANEL
Anion gap: 6 (ref 5–15)
BUN: 35 mg/dL — ABNORMAL HIGH (ref 8–23)
CO2: 30 mmol/L (ref 22–32)
Calcium: 10.3 mg/dL (ref 8.9–10.3)
Chloride: 101 mmol/L (ref 98–111)
Creatinine, Ser: 1.3 mg/dL — ABNORMAL HIGH (ref 0.61–1.24)
GFR, Estimated: 55 mL/min — ABNORMAL LOW (ref >60)
Glucose, Bld: 111 mg/dL — ABNORMAL HIGH (ref 70–99)
Potassium: 4.8 mmol/L (ref 3.5–5.1)
Sodium: 137 mmol/L (ref 135–145)

## 2022-04-29 MED ORDER — POTASSIUM CHLORIDE CRYS ER 20 MEQ PO TBCR: 20.0000 meq | EXTENDED_RELEASE_TABLET | Freq: Every day | ORAL | 6 refills | Status: DC

## 2022-04-29 MED ORDER — CARVEDILOL 25 MG PO TABS: 12.5000 mg | ORAL_TABLET | Freq: Two times a day (BID) | ORAL | 3 refills | Status: DC

## 2022-04-29 MED ORDER — FUROSEMIDE 40 MG PO TABS: 60.0000 mg | ORAL_TABLET | Freq: Two times a day (BID) | ORAL | 6 refills | Status: DC

## 2022-04-29 MED ORDER — SPIRONOLACTONE 25 MG PO TABS: 12.5000 mg | ORAL_TABLET | Freq: Every day | ORAL | 1 refills | Status: DC

## 2022-04-29 NOTE — Telephone Encounter (Signed)
-----   Message from Jacklynn Ganong, [place] sent at 04/29/2022 11:50 AM EDT ----- ?Labs ok. Stop ALL KCL supplementation. He has repeat labs next week ? ?Please call. ?

## 2022-04-29 NOTE — Telephone Encounter (Signed)
I attempted to reach patient to review results and recommendations per provider. I was unable to leave a message. Someone from HF team will attempt again to reach patient. ?

## 2022-04-29 NOTE — Patient Instructions (Addendum)
Thank you for coming in today ? ?Labs were done today, if any labs are abnormal the clinic will call you ?No news is good news ? ?START Spironolactone 12.5 mg 1/2 tablet  ? ?INCREASE Lasix to 60 mg twice daily  ? ?DECREASE Potassium to 20 meq 1 tablet daily  ? ?DECREASE Coreg to 12.5 mg 1/2 tablet daily  ? ?Your physician recommends that you return for lab work in:  ?1 week ? ?Your physician recommends that you schedule a follow-up appointment in:  ?3-4 weeks in clinic  ?3 month with Dr. Gala Romney with echocardiogram ? ?Your physician has requested that you have an echocardiogram. Echocardiography is a painless test that uses sound waves to create images of your heart. It provides your doctor with information about the size and shape of your heart and how well your heart?s chambers and valves are working. This procedure takes approximately one hour. There are no restrictions for this procedure. ?  ? ?At the [facility], you and your health needs are our priority. As part of our continuing mission to provide you with exceptional heart care, we have created designated Provider Care Teams. These Care Teams include your primary Cardiologist (physician) and Advanced Practice Providers (APPs- Physician Assistants and Nurse Practitioners) who all work together to provide you with the care you need, when you need it.  ? ?You may see any of the following providers on your designated Care Team at your next follow up: ?Dr Arvilla Meres ?Dr Marca Ancona ?Tonye Becket, NP ?Robbie Lis, PA ?Jessica Milford,NP ?Anna Genre, PA ?Karle Plumber, PharmD ? ? ?Please be sure to bring in all your medications bottles to every appointment.  ? ?If you have any questions or concerns before your next appointment please send [place] a message through [place] or call our office at [phone].   ? ?TO LEAVE A MESSAGE FOR THE NURSE SELECT OPTION 2, PLEASE LEAVE A MESSAGE INCLUDING: ?YOUR NAME ?DATE OF BIRTH ?CALL BACK  NUMBER ?REASON FOR CALL**this is important as we prioritize the call backs ? ?YOU WILL RECEIVE A CALL BACK THE SAME DAY AS LONG AS YOU CALL BEFORE 4:00 PM ? ?

## 2022-04-29 NOTE — Telephone Encounter (Signed)
Called and was unable to leave a voice message to confirm/remind patient of their appointment at the [facility] on 04/29/22.  ? ?  ? ?

## 2022-05-04 ENCOUNTER — Telehealth (HOSPITAL_COMMUNITY): Payer: Self-pay | Admitting: *Deleted

## 2022-05-04 NOTE — Telephone Encounter (Signed)
Patient called. Unable to reach patient. No answer- all phone does is ring. Unable to leave message.  Alyce Pagan RN VAD Coordinator  Office: [phone]  24/7 Pager: [phone]

## 2022-05-06 ENCOUNTER — Ambulatory Visit (HOSPITAL_COMMUNITY)
Admission: RE | Admit: 2022-05-06 | Discharge: 2022-05-06 | Disposition: A | Discharge status: Home / Self Care | Payer: Medicare HMO | Source: Ambulatory Visit | Attending: Cardiology | Admitting: Cardiology

## 2022-05-06 DIAGNOSIS — I5022 Chronic systolic (congestive) heart failure: Secondary | ICD-10-CM | POA: Diagnosis not present

## 2022-05-06 LAB — BASIC METABOLIC PANEL
Anion gap: 11 (ref 5–15)
BUN: 27 mg/dL — ABNORMAL HIGH (ref 8–23)
CO2: 27 mmol/L (ref 22–32)
Calcium: 10.8 mg/dL — ABNORMAL HIGH (ref 8.9–10.3)
Chloride: 99 mmol/L (ref 98–111)
Creatinine, Ser: 1.35 mg/dL — ABNORMAL HIGH (ref 0.61–1.24)
GFR, Estimated: 53 mL/min — ABNORMAL LOW (ref >60)
Glucose, Bld: 84 mg/dL (ref 70–99)
Potassium: 4.1 mmol/L (ref 3.5–5.1)
Sodium: 137 mmol/L (ref 135–145)

## 2022-05-26 ENCOUNTER — Telehealth (HOSPITAL_COMMUNITY): Payer: Self-pay

## 2022-05-26 NOTE — Telephone Encounter (Signed)
Called and was unable to leave a voice message to confirm/remind patient of their appointment at the [facility] on 05/27/22.

## 2022-05-26 NOTE — Telephone Encounter (Signed)
Error

## 2022-05-27 ENCOUNTER — Ambulatory Visit (HOSPITAL_COMMUNITY)
Admission: RE | Admit: 2022-05-27 | Discharge: 2022-05-27 | Disposition: A | Discharge status: Home / Self Care | Payer: Medicare HMO | Source: Ambulatory Visit | Attending: Family Medicine | Admitting: Family Medicine

## 2022-05-27 ENCOUNTER — Encounter (HOSPITAL_COMMUNITY): Payer: Self-pay

## 2022-05-27 VITALS — BP 126/60 | HR 73 | Wt 121.2 lb

## 2022-05-27 DIAGNOSIS — I130 Hypertensive heart and chronic kidney disease with heart failure and stage 1 through stage 4 chronic kidney disease, or unspecified chronic kidney disease: Secondary | ICD-10-CM | POA: Diagnosis not present

## 2022-05-27 DIAGNOSIS — Z951 Presence of aortocoronary bypass graft: Secondary | ICD-10-CM | POA: Diagnosis not present

## 2022-05-27 DIAGNOSIS — Z8679 Personal history of other diseases of the circulatory system: Secondary | ICD-10-CM | POA: Insufficient documentation

## 2022-05-27 DIAGNOSIS — Z7982 Long term (current) use of aspirin: Secondary | ICD-10-CM | POA: Insufficient documentation

## 2022-05-27 DIAGNOSIS — Z7902 Long term (current) use of antithrombotics/antiplatelets: Secondary | ICD-10-CM | POA: Insufficient documentation

## 2022-05-27 DIAGNOSIS — I2510 Atherosclerotic heart disease of native coronary artery without angina pectoris: Secondary | ICD-10-CM | POA: Insufficient documentation

## 2022-05-27 DIAGNOSIS — I340 Nonrheumatic mitral (valve) insufficiency: Secondary | ICD-10-CM

## 2022-05-27 DIAGNOSIS — I25810 Atherosclerosis of coronary artery bypass graft(s) without angina pectoris: Secondary | ICD-10-CM

## 2022-05-27 DIAGNOSIS — I071 Rheumatic tricuspid insufficiency: Secondary | ICD-10-CM

## 2022-05-27 DIAGNOSIS — I255 Ischemic cardiomyopathy: Secondary | ICD-10-CM | POA: Insufficient documentation

## 2022-05-27 DIAGNOSIS — J90 Pleural effusion, not elsewhere classified: Secondary | ICD-10-CM | POA: Diagnosis not present

## 2022-05-27 DIAGNOSIS — N183 Chronic kidney disease, stage 3 unspecified: Secondary | ICD-10-CM | POA: Diagnosis not present

## 2022-05-27 DIAGNOSIS — Z7984 Long term (current) use of oral hypoglycemic drugs: Secondary | ICD-10-CM | POA: Insufficient documentation

## 2022-05-27 DIAGNOSIS — Z79899 Other long term (current) drug therapy: Secondary | ICD-10-CM | POA: Diagnosis not present

## 2022-05-27 DIAGNOSIS — I5022 Chronic systolic (congestive) heart failure: Secondary | ICD-10-CM

## 2022-05-27 DIAGNOSIS — I6522 Occlusion and stenosis of left carotid artery: Secondary | ICD-10-CM | POA: Diagnosis not present

## 2022-05-27 DIAGNOSIS — I081 Rheumatic disorders of both mitral and tricuspid valves: Secondary | ICD-10-CM | POA: Insufficient documentation

## 2022-05-27 DIAGNOSIS — I739 Peripheral vascular disease, unspecified: Secondary | ICD-10-CM | POA: Diagnosis not present

## 2022-05-27 DIAGNOSIS — I4821 Permanent atrial fibrillation: Secondary | ICD-10-CM | POA: Diagnosis not present

## 2022-05-27 DIAGNOSIS — N1831 Chronic kidney disease, stage 3a: Secondary | ICD-10-CM | POA: Insufficient documentation

## 2022-05-27 DIAGNOSIS — I5082 Biventricular heart failure: Secondary | ICD-10-CM | POA: Diagnosis not present

## 2022-05-27 DIAGNOSIS — I4819 Other persistent atrial fibrillation: Secondary | ICD-10-CM

## 2022-05-27 LAB — BRAIN NATRIURETIC PEPTIDE: B Natriuretic Peptide: 382.2 pg/mL — ABNORMAL HIGH (ref 0.0–100.0)

## 2022-05-27 LAB — BASIC METABOLIC PANEL
Anion gap: 4 — ABNORMAL LOW (ref 5–15)
BUN: 38 mg/dL — ABNORMAL HIGH (ref 8–23)
CO2: 29 mmol/L (ref 22–32)
Calcium: 10.1 mg/dL (ref 8.9–10.3)
Chloride: 102 mmol/L (ref 98–111)
Creatinine, Ser: 1.38 mg/dL — ABNORMAL HIGH (ref 0.61–1.24)
GFR, Estimated: 51 mL/min — ABNORMAL LOW (ref >60)
Glucose, Bld: 99 mg/dL (ref 70–99)
Potassium: 4.5 mmol/L (ref 3.5–5.1)
Sodium: 135 mmol/L (ref 135–145)

## 2022-05-27 MED ORDER — LOSARTAN POTASSIUM 25 MG PO TABS: 12.5000 mg | ORAL_TABLET | Freq: Every day | ORAL | 3 refills | Status: DC

## 2022-05-27 NOTE — Patient Instructions (Signed)
START Losartan 12.5 mg one half tab nightly at bedtime  Labs today We will only contact you if something comes back abnormal or we need to make some changes. Otherwise no news is good news!  Labs needed in 10*14 days  Keep cardiology follow up as scheduled  Do the following things EVERYDAY: Weigh yourself in the morning before breakfast. Write it down and keep it in a log. Take your medicines as prescribed Eat low salt foods--Limit salt (sodium) to 2000 mg per day.  Stay as active as you can everyday Limit all fluids for the day to less than 2 liters  At the [facility], you and your health needs are our priority. As part of our continuing mission to provide you with exceptional heart care, we have created designated Provider Care Teams. These Care Teams include your primary Cardiologist (physician) and Advanced Practice Providers (APPs- Physician Assistants and Nurse Practitioners) who all work together to provide you with the care you need, when you need it.   You may see any of the following providers on your designated Care Team at your next follow up: Dr Arvilla Meres Dr Carron Curie, NP Robbie Lis, [place] [facility] [place], [place] Karle Plumber, PharmD   Please be sure to bring in all your medications bottles to every appointment.

## 2022-05-27 NOTE — Progress Notes (Signed)
Advanced Heart Failure Clinic Note    PCP: Orpah Melter, MD PCP-Cardiologist: Shelva Majestic, MD  AHF: Dr. Haroldine Laws  Structural Heart: Dr. Burt Knack   HPI: [name] [name] 81 y.o. male w/ h/o chronic systolic heart failure due to ischemic CM, CAD s/p CABG x 4, permanent atrial fibrillation, mitral regurgitation, HTN, HLD, PAD and Stage III CKD.    CABG took place in 2017 by Dr. Prescott Gum w/ LIMA to LAD, SVG to OM, SVG to PDA, and SVG to diagonal + LAA clip and left CEA for severe carotid artery stenosis.    He has had chronic Afib since at least 2017. Had cardioversion in 2017 but w/ early recurrence. Now rate controlled on ? blocker. No longer on a/c given h/o frequent nose bleeds. Has refused retrial.    Echo from 09/17/2017 showed depression of LV function with an EF of 25-30% with diffuse hypocontractility and akinesis of the inferolateral wall.  He had grade 3 diastolic dysfunction.  There was moderate mitral regurgitation, severe LA dilation, moderate RA dilation, and moderate aortic insufficiency.  PA pressure was significant increased at 85 mmHg   Echo 6/21 showed recovery of EF 45-50% with inferior to inferolateral hypokinesis.  Severe biatrial enlargement. Moderate to severe mitral regurgitation, mild to moderate tricuspid regurgitation and mild aortic insufficiency.    He had been followed by Dr. Claiborne Billings, but unfortunately lost to f/u during the [place] pandemic but apparently getting refills for lasix, coreg and ARB through PCP.   Admitted 4/23 w/ a/c CHF. Echo showed biventricular failure w/ severe MR/TR. LVEF 30-35%, mild LVH, D-shaped LV c/w RV volume overload, RV mildly enlarged w/ moderately reduced systolic function w/ severely elevated RVSP 66.0 mmHg. LA moderately moderately dilated, RA severely dilated, Mod AI, severe TR. Had poor response to IV Lasix w/ development of AKI, concerning for low output. HF team consulted. He was placed on empiric milrinone and lasix gtt w/ improved  diuresis. TEE showed moderate to severe mitral insufficiency, mostly at A2-P2. Underwent RHC/LHC on milrinone 0.125 mcg/kg/min, demonstrated severe native vessel CAD, 4/4 patent grafts, mild PAH, relatively well compensated L-sided pressures and cardiac output on milrinone and Prominent v-wave (25) in PCWP tracing c/w known MR. Structural heart team was consulted and he is being considered for MitraClip. Drips weaned off, discharged home, weight 128 lbs.  Post hospital follow up, volume overloaded, weight up 10 lbs (139 lbs). He had been taking 25 mg bid instead of 3.125 of Coreg. Farxiga added, Lasix increased to 60/40 and Coreg weaned to 12.5 bid.  Saw Dr. Burt Knack 5/23 and patient decided against MitraClip.   Today he returns for HF follow up with his wife. Overall feeling better on higher diuretic dose. Swelling has improved and he is not short of breath walking on flat ground or with ADLs. Denies palpitations, CP, dizziness, edema, or PND/Orthopnea. Appetite ok. No fever or chills. Weight at home 120 pounds. Taking all medications.   Cardiac Studies:  - TEE (4/23): EF 30-35%, septum flattened in diastole, RV severely reduced, moderate to severe mitral insufficiency, mostly at A2-P2, severe TR  - R/LHC (4/23): on milrinone 0.125 mcg/kg/min    Prox RCA to Mid RCA lesion is 100% stenosed.   Prox Cx to Mid Cx lesion is 50% stenosed.   2nd Mrg lesion is 100% stenosed.   Prox Graft to Mid Graft lesion is 30% stenosed.   Mid LAD lesion is 70% stenosed.   2nd Diag lesion is 100% stenosed.   Ao =  158/64 (98) LV = 159/10 RA = 5 RV = 49/5 PA = 52/16 (13) PCW = 16 (v = 25) Fick cardiac output/index = 4.6/2.9 PVR = 3.7 WU FA sat = 99% PA sat = 73%, 75% SVC 74%   1. Severe native 3v CAD 2. 4/4 grafts patent 3. Mild PAH  4. Relatively well compensated L-sided pressures and cardiac output on milrinone 5. Prominent v-wave (25) in PCWP tracing c/w known MR   Plan/Discussion:  Medical  therapy. Proceed with TEE to further evaluate MV.  - Echo (4/23): EF 30-35%, mild LVH, D-shaped LV c/w RV volume overload, RV mildly enlarged w/ moderately reduced systolic function w/ severely elevated RVSP 66.0 mmHg. LA moderately moderately dilated, RA severely dilated, Mod AI, severe TR.   - Echo (6/21): EF 45-50%  - Echo (10/18): EF 25-30%  Past Medical History:  Diagnosis Date   Atrial fibrillation ([place]) 03/21/2016   Current Outpatient Medications  Medication Sig Dispense Refill   aspirin 81 MG chewable tablet Chew 1 tablet (81 mg total) by mouth daily. 30 tablet 0   atorvastatin (LIPITOR) 80 MG tablet Take 1 tablet (80 mg total) by mouth daily at 6 PM. 90 tablet 0   carvedilol (COREG) 25 MG tablet Patient takes 1/2 tablet by mouth daily.     clopidogrel (PLAVIX) 75 MG tablet Take 1 tablet (75 mg total) by mouth daily. 90 tablet 3   dapagliflozin propanediol (FARXIGA) 10 MG TABS tablet Take 1 tablet (10 mg total) by mouth daily before breakfast. 30 tablet 11   furosemide (LASIX) 40 MG tablet Take 1.5 tablets (60 mg total) by mouth 2 (two) times daily. 75 tablet 6   potassium chloride SA (KLOR-CON M) 20 MEQ tablet Take 1 tablet (20 mEq total) by mouth daily. 30 tablet 6   spironolactone (ALDACTONE) 25 MG tablet Take 0.5 tablets (12.5 mg total) by mouth at bedtime. 90 tablet 1   No current facility-administered medications for this encounter.   No Known Allergies  Social History   Socioeconomic History   Marital status: Married    Spouse name: [name] [name]   Number of children: 2   Years of education: Not on file   Highest education level: High school graduate  Occupational History   Occupation: Retired    Comment: Architect  Tobacco Use   Smoking status: Former   Smokeless tobacco: Former   Tobacco comments:    Quit Neurosurgeon Use: Never used  Substance and Sexual Activity   Alcohol use: Never   Drug use: Not Currently   Sexual activity: Not on  file  Other Topics Concern   Not on file  Social History Narrative   Not on file   Social Determinants of Health   Financial Resource Strain: Low Risk  (03/19/2022)   Overall Financial Resource Strain (CARDIA)    Difficulty of Paying Living Expenses: Not hard at all  Food Insecurity: No Food Insecurity (03/19/2022)   Hunger Vital Sign    Worried About Running Out of Food in the Last Year: Never true    [place] in the Last Year: Never true  Transportation Needs: No Transportation Needs (03/19/2022)   PRAPARE - Hydrologist (Medical): No    Lack of Transportation (Non-Medical): No  Physical Activity: Not on file  Stress: Not on file  Social Connections: Not on file  Intimate Partner Violence: Not on file    No family  history on file.  BP 126/60   Pulse 73   Wt 55 kg (121 lb 3.2 oz)   SpO2 98%   BMI 22.17 kg/m   Wt Readings from Last 3 Encounters:  05/27/22 55 kg (121 lb 3.2 oz)  04/29/22 59.1 kg (130 lb 6.4 oz)  04/27/22 60.5 kg (133 lb 6.4 oz)   PHYSICAL EXAM: General:  NAD. No resp difficulty, walked into clinic with cane, elderly HEENT: Normal Neck: Supple. No JVD. Carotids 2+ bilat; no bruits. No lymphadenopathy or thryomegaly appreciated. Cor: PMI nondisplaced. Irregular rate & rhythm. No rubs, gallops, 2/6 MR Lungs: Clear Abdomen: Soft, nontender, nondistended. No hepatosplenomegaly. No bruits or masses. Good bowel sounds. Extremities: No cyanosis, clubbing, rash, edema; compression hose on. Neuro: Alert & oriented x 3, cranial nerves grossly intact. Moves all 4 extremities w/o difficulty. Affect pleasant.   ASSESSMENT & PLAN: 1. Chronic Biventricular Heart Failure - Ischemic CM, severe MVCAD s/p CABG in 2017 - Echo 2017 EF 25-30%, RV normal  - Echo 2018 EF 20-25%, RV mildly reduced - Echo 2021 EF improved 45-50%, RV normal  - Echo 4/23 EF 30-35%, diffuse HK, RV moderately reduced>>Admit for NYHA Class IIIb w/ marked fluid  overload c/b worsening renal function w/ diuresis. Started on empiric milrinone 0.125 to augment diuresis. RHC/LHC on milrinone showed severe 3V disease patent grafts and preserved cardiac output w/ compensated filling pressures   - Improved NYHA II, he is not volume overloaded today. - Start losartan 12.5 mg qhs (I'm unsure BP will tolerate Entresto). - Continue spiro 12.5 mg daily. - Continue Lasix 60 mg bid. - Continue carvedilol 12.5 mg bid. - Continue Farxiga 10 mg daily  - Continue TED hose. - BMET and BNP today, repeat BMET in 7-10 days.    2. Severe Mitral Regurgitation  - severe on 2D echo w/ posterior leaflet tethering - suspect mostly functional, in the setting of severe dilated CM + chronic afib  - TEE (4/23) confirmed severe mitral insufficiency - He has seen Structural Team and declined MitraClip. - Continue to optimize from CHF standpoint   3. Severe Tricuspid Regurgitation  - Functional, from dilated RV - HF optimization per above.    4. CAD - s/p CABG x 4 in 2017 (LIMA to LAD, SVG to OM, SVG to PDA, and SVG to diagonal) - LHC (4/23) w/ 4/4 patent grafts  - No chest pain.  - Continue medical therapy, on asa + statin    5. Permanent Atrial Fibrillation  - Chronic since 2017, failed cardioversion - LAA clip at time of CABG in 2017 - No longer on a/c given h/o frequent nose bleeds - Rate controlled.   6. Stage IIIa CKD - Last baseline SCr ~1.2 - Labs today.     7. H/o Pleural Effusion  - s/p Rt Thoracentesis 03/17/22 w/ 1.3L fluid removal  - Lung exam clear today. Oxygen 98% on room air.   8. PAD - h/o severe carotid artery stenosis, s/p left CEA - LE PAD w/ evidence of multilevel arterial occlusive disease - Reduced ABI on rt, c/w moderate disease + LE wound - Followed by Dr. Scot Dock.  Follow up in 2 months with Dr. Haroldine Laws + echo, as scheduled.  [place], FNP 05/27/22

## 2022-06-04 DIAGNOSIS — I340 Nonrheumatic mitral (valve) insufficiency: Secondary | ICD-10-CM | POA: Diagnosis not present

## 2022-06-04 DIAGNOSIS — I4819 Other persistent atrial fibrillation: Secondary | ICD-10-CM | POA: Diagnosis not present

## 2022-06-04 DIAGNOSIS — I25810 Atherosclerosis of coronary artery bypass graft(s) without angina pectoris: Secondary | ICD-10-CM | POA: Diagnosis not present

## 2022-06-04 DIAGNOSIS — E7800 Pure hypercholesterolemia, unspecified: Secondary | ICD-10-CM | POA: Diagnosis not present

## 2022-06-04 DIAGNOSIS — N1831 Chronic kidney disease, stage 3a: Secondary | ICD-10-CM | POA: Diagnosis not present

## 2022-06-04 DIAGNOSIS — I5042 Chronic combined systolic (congestive) and diastolic (congestive) heart failure: Secondary | ICD-10-CM | POA: Diagnosis not present

## 2022-06-04 DIAGNOSIS — I240 Acute coronary thrombosis not resulting in myocardial infarction: Secondary | ICD-10-CM | POA: Diagnosis not present

## 2022-06-10 ENCOUNTER — Telehealth (HOSPITAL_COMMUNITY): Payer: Self-pay | Admitting: Surgery

## 2022-06-10 ENCOUNTER — Ambulatory Visit (HOSPITAL_COMMUNITY)
Admission: RE | Admit: 2022-06-10 | Discharge: 2022-06-10 | Disposition: A | Discharge status: Home / Self Care | Payer: Medicare HMO | Source: Ambulatory Visit | Attending: Cardiology | Admitting: Cardiology

## 2022-06-10 DIAGNOSIS — I5022 Chronic systolic (congestive) heart failure: Secondary | ICD-10-CM | POA: Insufficient documentation

## 2022-06-10 LAB — BASIC METABOLIC PANEL
Anion gap: 5 (ref 5–15)
BUN: 47 mg/dL — ABNORMAL HIGH (ref 8–23)
CO2: 29 mmol/L (ref 22–32)
Calcium: 10.3 mg/dL (ref 8.9–10.3)
Chloride: 106 mmol/L (ref 98–111)
Creatinine, Ser: 1.77 mg/dL — ABNORMAL HIGH (ref 0.61–1.24)
GFR, Estimated: 38 mL/min — ABNORMAL LOW (ref >60)
Glucose, Bld: 82 mg/dL (ref 70–99)
Potassium: 4.5 mmol/L (ref 3.5–5.1)
Sodium: 140 mmol/L (ref 135–145)

## 2022-06-10 NOTE — Telephone Encounter (Signed)
I attempted to reach patient regarding results and recommendations.  I was unable to leave a message.  Someone from AHF clinic will attempt to reach patient again.

## 2022-06-10 NOTE — Telephone Encounter (Signed)
-----   Message from Jacklynn Ganong, [place] sent at 06/10/2022  9:24 AM EDT ----- Kidney function elevated. Please stop losartan and repeat BMET in 10 days.

## 2022-06-25 ENCOUNTER — Other Ambulatory Visit (HOSPITAL_COMMUNITY): Payer: Self-pay | Admitting: Internal Medicine

## 2022-06-29 ENCOUNTER — Encounter (HOSPITAL_COMMUNITY): Payer: Self-pay

## 2022-08-04 ENCOUNTER — Encounter (HOSPITAL_COMMUNITY): Payer: Self-pay | Admitting: Internal Medicine

## 2022-08-04 ENCOUNTER — Ambulatory Visit (HOSPITAL_BASED_OUTPATIENT_CLINIC_OR_DEPARTMENT_OTHER)
Admission: RE | Admit: 2022-08-04 | Discharge: 2022-08-04 | Disposition: A | Discharge status: Home / Self Care | Payer: Medicare HMO | Source: Ambulatory Visit | Attending: Internal Medicine | Admitting: Internal Medicine

## 2022-08-04 ENCOUNTER — Ambulatory Visit (HOSPITAL_COMMUNITY)
Admission: RE | Admit: 2022-08-04 | Discharge: 2022-08-04 | Disposition: A | Discharge status: Home / Self Care | Payer: Medicare HMO | Source: Ambulatory Visit | Attending: Internal Medicine | Admitting: Internal Medicine

## 2022-08-04 VITALS — BP 130/66 | HR 64 | Wt 113.8 lb

## 2022-08-04 DIAGNOSIS — I340 Nonrheumatic mitral (valve) insufficiency: Secondary | ICD-10-CM | POA: Diagnosis not present

## 2022-08-04 DIAGNOSIS — I743 Embolism and thrombosis of arteries of the lower extremities: Secondary | ICD-10-CM | POA: Insufficient documentation

## 2022-08-04 DIAGNOSIS — I255 Ischemic cardiomyopathy: Secondary | ICD-10-CM | POA: Insufficient documentation

## 2022-08-04 DIAGNOSIS — I4819 Other persistent atrial fibrillation: Secondary | ICD-10-CM | POA: Diagnosis not present

## 2022-08-04 DIAGNOSIS — Z7984 Long term (current) use of oral hypoglycemic drugs: Secondary | ICD-10-CM | POA: Diagnosis not present

## 2022-08-04 DIAGNOSIS — I2510 Atherosclerotic heart disease of native coronary artery without angina pectoris: Secondary | ICD-10-CM | POA: Insufficient documentation

## 2022-08-04 DIAGNOSIS — I130 Hypertensive heart and chronic kidney disease with heart failure and stage 1 through stage 4 chronic kidney disease, or unspecified chronic kidney disease: Secondary | ICD-10-CM | POA: Diagnosis not present

## 2022-08-04 DIAGNOSIS — I5022 Chronic systolic (congestive) heart failure: Secondary | ICD-10-CM

## 2022-08-04 DIAGNOSIS — I5082 Biventricular heart failure: Secondary | ICD-10-CM | POA: Insufficient documentation

## 2022-08-04 DIAGNOSIS — I083 Combined rheumatic disorders of mitral, aortic and tricuspid valves: Secondary | ICD-10-CM | POA: Insufficient documentation

## 2022-08-04 DIAGNOSIS — I25810 Atherosclerosis of coronary artery bypass graft(s) without angina pectoris: Secondary | ICD-10-CM | POA: Diagnosis not present

## 2022-08-04 DIAGNOSIS — Z79899 Other long term (current) drug therapy: Secondary | ICD-10-CM | POA: Insufficient documentation

## 2022-08-04 DIAGNOSIS — Z8679 Personal history of other diseases of the circulatory system: Secondary | ICD-10-CM | POA: Diagnosis not present

## 2022-08-04 DIAGNOSIS — Z951 Presence of aortocoronary bypass graft: Secondary | ICD-10-CM | POA: Diagnosis not present

## 2022-08-04 DIAGNOSIS — E785 Hyperlipidemia, unspecified: Secondary | ICD-10-CM | POA: Insufficient documentation

## 2022-08-04 DIAGNOSIS — I4821 Permanent atrial fibrillation: Secondary | ICD-10-CM | POA: Diagnosis not present

## 2022-08-04 DIAGNOSIS — N1831 Chronic kidney disease, stage 3a: Secondary | ICD-10-CM | POA: Diagnosis not present

## 2022-08-04 LAB — ECHOCARDIOGRAM COMPLETE
Calc EF: 28.4 %
MV M vel: 5.45 m/s
MV Peak grad: 118.8 mmHg
P 1/2 time: 331 msec
S' Lateral: 4.3 cm
Single Plane A2C EF: 30.1 %
Single Plane A4C EF: 30.4 %

## 2022-08-04 LAB — BASIC METABOLIC PANEL
Anion gap: 10 (ref 5–15)
BUN: 53 mg/dL — ABNORMAL HIGH (ref 8–23)
CO2: 26 mmol/L (ref 22–32)
Calcium: 10.8 mg/dL — ABNORMAL HIGH (ref 8.9–10.3)
Chloride: 101 mmol/L (ref 98–111)
Creatinine, Ser: 2.2 mg/dL — ABNORMAL HIGH (ref 0.61–1.24)
GFR, Estimated: 29 mL/min — ABNORMAL LOW (ref >60)
Glucose, Bld: 87 mg/dL (ref 70–99)
Potassium: 4.9 mmol/L (ref 3.5–5.1)
Sodium: 137 mmol/L (ref 135–145)

## 2022-08-04 NOTE — Patient Instructions (Signed)
Medication Changes:  No changes, continue current regimen.  Lab Work:  Labs done today, your results will be available in MyChart, we will contact you for abnormal readings.  Testing/Procedures:  None.  Referrals:  None.  Special Instructions // Education:  Do the following things EVERYDAY: Weigh yourself in the morning before breakfast. Write it down and keep it in a log. Take your medicines as prescribed Eat low salt foods--Limit salt (sodium) to 2000 mg per day.  Stay as active as you can everyday Limit all fluids for the day to less than 2 liters  Follow-Up in: 4 months. Dec 6 @ 10AM  At the [facility], you and your health needs are our priority. We have a designated team specialized in the treatment of Heart Failure. This Care Team includes your primary Heart Failure Specialized Cardiologist (physician), Advanced Practice Providers (APPs- Physician Assistants and Nurse Practitioners), and Pharmacist who all work together to provide you with the care you need, when you need it.   You may see any of the following providers on your designated Care Team at your next follow up:  Dr Arvilla Meres Dr Carron Curie, NP Robbie Lis, [place] [facility] [place], [place] Karle Plumber, PharmD   Please be sure to bring in all your medications bottles to every appointment.   Need to Contact us:  If you have any questions or concerns before your next appointment please send [place] a message through [place] or call our office at [phone].    TO LEAVE A MESSAGE FOR THE NURSE SELECT OPTION 2, PLEASE LEAVE A MESSAGE INCLUDING: YOUR NAME DATE OF BIRTH CALL BACK NUMBER REASON FOR CALL**this is important as we prioritize the call backs  YOU WILL RECEIVE A CALL BACK THE SAME DAY AS LONG AS YOU CALL BEFORE 4:00 PM

## 2022-08-04 NOTE — Progress Notes (Signed)
Advanced Heart Failure Clinic Note    PCP: Orpah Melter, MD PCP-Cardiologist: Shelva Majestic, MD  AHF: Dr. Haroldine Laws  Structural Heart: Dr. Burt Knack   HPI: [name] [name] 81 y.o. male w/ h/o chronic systolic heart failure due to ischemic CM, CAD s/p CABG x 4, permanent atrial fibrillation, mitral regurgitation, HTN, HLD, PAD and Stage III CKD.    CABG took place in 2017 by Dr. Prescott Gum w/ LIMA to LAD, SVG to OM, SVG to PDA, and SVG to diagonal + LAA clip and left CEA for severe carotid artery stenosis.    He has had chronic Afib since at least 2017. Had cardioversion in 2017 but w/ early recurrence. Now rate controlled on ? blocker. No longer on a/c given h/o frequent nose bleeds. Has refused retrial.    Echo from 09/17/2017 showed depression of LV function with an EF of 25-30% with diffuse hypocontractility and akinesis of the inferolateral wall. He had grade 3 diastolic dysfunction.  There was moderate mitral regurgitation, severe LA dilation, moderate RA dilation, and moderate aortic insufficiency.  PA pressure was significant increased at 85 mmHg   Echo 6/21 showed recovery of EF 45-50% with inferior to inferolateral hypokinesis.  Severe biatrial enlargement. Moderate to severe mitral regurgitation, mild to moderate tricuspid regurgitation and mild aortic insufficiency.    He had been followed by Dr. Claiborne Billings, but unfortunately lost to f/u during the [place] pandemic but apparently getting refills for lasix, coreg and ARB through PCP.   Admitted 4/23 w/ a/c CHF. Echo showed biventricular failure w/ severe MR/TR. LVEF 30-35%, mild LVH, D-shaped LV c/w RV volume overload, RV mildly enlarged w/ moderately reduced systolic function w/ severely elevated RVSP 66.0 mmHg. LA moderately moderately dilated, RA severely dilated, Mod AI, severe TR. Had poor response to IV Lasix w/ development of AKI, concerning for low output. HF team consulted. He was placed on empiric milrinone and lasix gtt w/ improved  diuresis. TEE showed moderate to severe mitral insufficiency, mostly at A2-P2. Underwent RHC/LHC on milrinone 0.125 mcg/kg/min, demonstrated severe native vessel CAD, 4/4 patent grafts, mild PAH, relatively well compensated L-sided pressures and cardiac output on milrinone and Prominent v-wave (25) in PCWP tracing c/w known MR. Structural heart team was consulted and was being considered for MitraClip. Drips weaned off, discharged home, weight 128 lbs.  Post hospital follow up, volume overloaded, weight up 10 lbs (139 lbs). He had been taking 25 mg bid instead of 3.125 of Coreg. Farxiga added, Lasix increased to 60/40 and Coreg weaned to 12.5 bid.  Saw Dr. Burt Knack 5/23 and patient decided against MitraClip.   Today he returns for HF follow up and repeat echo. He is here with his wife. Echo shows EF ~25-30% w/ severe MR but PA pressures much improved.   He reports felling well. No very active but able to do basic ADLs w/ dyspnea. Volume status good on exam. Wt actually down 7 lb since last visit. Notes appetite is not great. BP 130/66. No orthostatic symptoms. HR controlled in the 60s. Compliant w/ meds.   Still not interested in MitraClip.   Cardiac Studies:  - TEE (4/23): EF 30-35%, septum flattened in diastole, RV severely reduced, moderate to severe mitral insufficiency, mostly at A2-P2, severe TR  - R/LHC (4/23): on milrinone 0.125 mcg/kg/min    Prox RCA to Mid RCA lesion is 100% stenosed.   Prox Cx to Mid Cx lesion is 50% stenosed.   2nd Mrg lesion is 100% stenosed.   Prox Graft to  Mid Graft lesion is 30% stenosed.   Mid LAD lesion is 70% stenosed.   2nd Diag lesion is 100% stenosed.   Ao = 158/64 (98) LV = 159/10 RA = 5 RV = 49/5 PA = 52/16 (13) PCW = 16 (v = 25) Fick cardiac output/index = 4.6/2.9 PVR = 3.7 WU FA sat = 99% PA sat = 73%, 75% SVC 74%   1. Severe native 3v CAD 2. 4/4 grafts patent 3. Mild PAH  4. Relatively well compensated L-sided pressures and cardiac  output on milrinone 5. Prominent v-wave (25) in PCWP tracing c/w known MR   Plan/Discussion:  Medical therapy. Proceed with TEE to further evaluate MV.  - Echo (4/23): EF 30-35%, mild LVH, D-shaped LV c/w RV volume overload, RV mildly enlarged w/ moderately reduced systolic function w/ severely elevated RVSP 66.0 mmHg. LA moderately moderately dilated, RA severely dilated, Mod AI, severe TR.   - Echo (6/21): EF 45-50%  - Echo (10/18): EF 25-30%  - Echo (8/23) Today: EF 25-30%, Severe MR   Past Medical History:  Diagnosis Date   Atrial fibrillation ([facility]) 03/21/2016   Current Outpatient Medications  Medication Sig Dispense Refill   aspirin 81 MG chewable tablet Chew 1 tablet (81 mg total) by mouth daily. 30 tablet 0   atorvastatin (LIPITOR) 80 MG tablet Take 1 tablet (80 mg total) by mouth daily at 6 PM. 90 tablet 0   carvedilol (COREG) 25 MG tablet TAKE 1 TABLET (25 MG) BY MOUTH IN THE MORNING AND AT BEDTIME 180 tablet 1   clopidogrel (PLAVIX) 75 MG tablet Take 1 tablet (75 mg total) by mouth daily. 90 tablet 3   dapagliflozin propanediol (FARXIGA) 10 MG TABS tablet Take 1 tablet (10 mg total) by mouth daily before breakfast. 30 tablet 11   furosemide (LASIX) 40 MG tablet Take 1.5 tablets (60 mg total) by mouth 2 (two) times daily. 75 tablet 6   losartan (COZAAR) 25 MG tablet Take 0.5 tablets (12.5 mg total) by mouth at bedtime. 45 tablet 3   potassium chloride SA (KLOR-CON M) 20 MEQ tablet Take 1 tablet (20 mEq total) by mouth daily. 30 tablet 6   spironolactone (ALDACTONE) 25 MG tablet Take 0.5 tablets (12.5 mg total) by mouth at bedtime. 90 tablet 1   No current facility-administered medications for this encounter.   No Known Allergies  Social History   Socioeconomic History   Marital status: Married    Spouse name: Ilai Hiller   Number of children: 2   Years of education: Not on file   Highest education level: High school graduate  Occupational History   Occupation:  Retired    Comment: Holiday representative  Tobacco Use   Smoking status: Former   Smokeless tobacco: Former   Tobacco comments:    Quit Hotel manager Use: Never used  Substance and Sexual Activity   Alcohol use: Never   Drug use: Not Currently   Sexual activity: Not on file  Other Topics Concern   Not on file  Social History Narrative   Not on file   Social Determinants of Health   Financial Resource Strain: Low Risk  (03/19/2022)   Overall Financial Resource Strain (CARDIA)    Difficulty of Paying Living Expenses: Not hard at all  Food Insecurity: No Food Insecurity (03/19/2022)   Hunger Vital Sign    Worried About Running Out of Food in the Last Year: Never true    Ran Out of Food  in the Last Year: Never true  Transportation Needs: No Transportation Needs (03/19/2022)   PRAPARE - Hydrologist (Medical): No    Lack of Transportation (Non-Medical): No  Physical Activity: Not on file  Stress: Not on file  Social Connections: Not on file  Intimate Partner Violence: Not on file    History reviewed. No pertinent family history.  BP 130/66   Pulse 64   Wt 51.6 kg (113 lb 12.8 oz)   SpO2 97%   BMI 20.81 kg/m   Wt Readings from Last 3 Encounters:  08/04/22 51.6 kg (113 lb 12.8 oz)  05/27/22 55 kg (121 lb 3.2 oz)  04/29/22 59.1 kg (130 lb 6.4 oz)   PHYSICAL EXAM: General:  elderly, frail WM. No respiratory difficulty HEENT: normal Neck: supple. no JVD. Carotids 2+ bilat; no bruits. No lymphadenopathy or thyromegaly appreciated. Cor: PMI nondisplaced. Irregularly irregular rhythm and rate. 2/6 MR murmur PMI laterally displaced  Lungs: clear Abdomen: soft, nontender, nondistended. No hepatosplenomegaly. No bruits or masses. Good bowel sounds. Extremities: no cyanosis, clubbing, rash, edema Neuro: alert & oriented x 3, cranial nerves grossly intact. moves all 4 extremities w/o difficulty. Affect pleasant.   ASSESSMENT & PLAN: 1. Chronic  Biventricular Heart Failure - Ischemic CM, severe MVCAD s/p CABG in 2017 - Echo 2017 EF 25-30%, RV normal  - Echo 2018 EF 20-25%, RV mildly reduced - Echo 2021 EF improved 45-50%, RV normal  - Echo 4/23 EF 30-35%, diffuse HK, RV moderately reduced>>Admit for NYHA Class IIIb w/ marked fluid overload c/b worsening renal function w/ diuresis. Started on empiric milrinone 0.125 to augment diuresis. RHC/LHC on milrinone showed severe 3V disease patent grafts and preserved cardiac output w/ compensated filling pressures   - Improved NYHA II, he is not volume overloaded today. - Echo done today, EF 25-30%, severe MR (continues to refuse MitraClip)  - Continue losartan 12.5 mg qhs.   - Continue spiro 12.5 mg daily. - Continue Lasix 60 mg bid. - Continue carvedilol 25 mg bid. - Continue Farxiga 10 mg daily  - Check BMP today     2. Severe Mitral Regurgitation  - severe on 2D echo 6/21 w/ posterior leaflet tethering - suspect mostly functional, in the setting of severe dilated CM + chronic afib  - TEE (4/23) confirmed severe mitral insufficiency - He has seen Structural Team and declined MitraClip. - Echo today shows persistent severe MR, though PA pressures much improved, 37.4 mmHg. Continues to decline MitraClip  - Continue to optimize from CHF standpoint   3. Severe Tricuspid Regurgitation  - Functional, from dilated RV - HF optimization per above.    4. CAD - s/p CABG x 4 in 2017 (LIMA to LAD, SVG to OM, SVG to PDA, and SVG to diagonal) - LHC (4/23) w/ 4/4 patent grafts  - No chest pain.  - Continue medical therapy, on asa + statin    5. Permanent Atrial Fibrillation  - Chronic since 2017, failed cardioversion - LAA clip at time of CABG in 2017 - No longer on a/c given h/o frequent nose bleeds - Rate controlled. HR 60s   6. Stage IIIa CKD - Check BMP today     7. H/o Pleural Effusion  - s/p Rt Thoracentesis 03/17/22 w/ 1.3L fluid removal  - Lung exam clear today. Oxygen 97% on  room air.   8. PAD - h/o severe carotid artery stenosis, s/p left CEA - LE PAD w/ evidence of multilevel arterial  occlusive disease - Reduced ABI on rt, c/w moderate disease. Stable w/o claudication  - Followed by Dr. Edilia Bo.   F/u in 3 months w/ APP   Robbie Lis, PA-C 08/04/22   Patient seen and examined with the above-signed Advanced Practice Provider and/or Housestaff. I personally reviewed laboratory data, imaging studies and relevant notes. I independently examined the patient and formulated the important aspects of the plan. I have edited the note to reflect any of my changes or salient points. I have personally discussed the plan with the patient and/or family.  81 y/o male as above. Returns for hospital f/u.   Doing much better. NYHA II. Denies SOB, orthopnea or PND. No CP. Compliant with meds  Echo today 25-30% severe MR due to severe LAE and posterior leaflet restriction. PA pressures much improved from previous.   General:  Elderly thin No resp difficulty HEENT: normal Neck: supple. no JVD. Carotids 2+ bilat; no bruits. No lymphadenopathy or thryomegaly appreciated. Cor: PMI nondisplaced. Irregular rate & rhythm. No rubs, gallops or murmurs. Lungs: clear Abdomen: soft, nontender, nondistended. No hepatosplenomegaly. No bruits or masses. Good bowel sounds. Extremities: no cyanosis, clubbing, rash, edema Neuro: alert & orientedx3, cranial nerves grossly intact. moves all 4 extremities w/o difficulty. Affect pleasant  Overall doing quite well. Not interested in mitral clip (or any other procedures). Continue current therapy. Unable to obtain BP in RUE despite multiple attempts but LUE easy to measure.  Arvilla Meres, MD  1:38 PM

## 2022-08-11 ENCOUNTER — Encounter (HOSPITAL_COMMUNITY): Payer: Self-pay | Admitting: Cardiology

## 2022-09-11 ENCOUNTER — Other Ambulatory Visit: Payer: Self-pay | Admitting: Cardiovascular Disease

## 2022-10-03 ENCOUNTER — Other Ambulatory Visit (HOSPITAL_COMMUNITY): Payer: Self-pay | Admitting: Family Medicine

## 2022-11-16 NOTE — Progress Notes (Signed)
[facility] Note    PCP: Joycelyn Rua, MD Primary Cardiologist: Nicki Guadalajara, MD  HF Cardiologist: Dr. Gala Romney  Structural Heart: Dr. Excell Seltzer   HPI: [name] [name] 81 y.o. male w/ h/o chronic systolic heart failure due to ischemic CM, CAD s/p CABG x 4, permanent atrial fibrillation, mitral regurgitation, HTN, HLD, PAD and Stage III CKD.    CABG took place in 2017 by Dr. Donata Clay w/ LIMA to LAD, SVG to OM, SVG to PDA, and SVG to diagonal + LAA clip and left CEA for severe carotid artery stenosis.    He has had chronic Afib since at least 2017. Had cardioversion in 2017 but w/ early recurrence. Now rate controlled on ? blocker. No longer on a/c given h/o frequent nose bleeds. Has refused retrial.    Echo from 09/17/2017 showed depression of LV function with an EF of 25-30% with diffuse hypocontractility and akinesis of the inferolateral wall. He had grade 3 diastolic dysfunction.  There was moderate mitral regurgitation, severe LA dilation, moderate RA dilation, and moderate aortic insufficiency.  PA pressure was significant increased at 85 mmHg   Echo 6/21 showed recovery of EF 45-50% with inferior to inferolateral hypokinesis.  Severe biatrial enlargement. Moderate to severe mitral regurgitation, mild to moderate tricuspid regurgitation and mild aortic insufficiency.    He had been followed by Dr. Tresa Endo, but unfortunately lost to f/u during the COVID pandemic but apparently getting refills for lasix, coreg and ARB through PCP.   Admitted 4/23 w/ a/c CHF. Echo showed biventricular failure w/ severe MR/TR. LVEF 30-35%, mild LVH, D-shaped LV c/w RV volume overload, RV mildly enlarged w/ moderately reduced systolic function w/ severely elevated RVSP 66.0 mmHg. LA moderately moderately dilated, RA severely dilated, Mod AI, severe TR. Had poor response to IV Lasix w/ development of AKI, concerning for low output. HF team consulted. He was placed on empiric milrinone and lasix gtt  w/ improved diuresis. TEE showed moderate to severe mitral insufficiency, mostly at A2-P2. Underwent RHC/LHC on milrinone 0.125 mcg/kg/min, demonstrated severe native vessel CAD, 4/4 patent grafts, mild PAH, relatively well compensated L-sided pressures and cardiac output on milrinone and Prominent v-wave (25) in PCWP tracing c/w known MR. Structural heart team was consulted and was being considered for MitraClip. Drips weaned off, discharged home, weight 128 lbs.  Post hospital follow up, volume overloaded, weight up 10 lbs (139 lbs). He had been taking 25 mg bid instead of 3.125 of Coreg. Farxiga added, Lasix increased to 60/40 and Coreg weaned to 12.5 bid.  Saw Dr. Excell Seltzer 5/23 and patient decided against MitraClip.   Echo 8/23 EF ~25-30% w/ severe MR but PA pressures much improved.   Today he returns for HF follow up with his wife. Overall feeling fine. He is not SOB with ADLs or walking on flat ground. Wife says he is not very physically active but gets around the house OK. Denies palpitations, CP, dizziness, edema, or PND/Orthopnea. Appetite ok. No fever or chills. Weight at home 122 pounds. Taking all medications. Still not interested in MitraClip.   Cardiac Studies: - Echo (8/23): EF 25-30%, severe MR due to severe LAE and posterior leaflet restriction.  - TEE (4/23): EF 30-35%, septum flattened in diastole, RV severely reduced, moderate to severe mitral insufficiency, mostly at A2-P2, severe TR  - R/LHC (4/23): on milrinone 0.125 mcg/kg/min    Prox RCA to Mid RCA lesion is 100% stenosed.   Prox Cx to Mid Cx lesion is 50% stenosed.  2nd Mrg lesion is 100% stenosed.   Prox Graft to Mid Graft lesion is 30% stenosed.   Mid LAD lesion is 70% stenosed.   2nd Diag lesion is 100% stenosed.   Ao = 158/64 (98) LV = 159/10 RA = 5 RV = 49/5 PA = 52/16 (13) PCW = 16 (v = 25) Fick cardiac output/index = 4.6/2.9 PVR = 3.7 WU FA sat = 99% PA sat = 73%, 75% SVC 74%   1. Severe native 3v  CAD 2. 4/4 grafts patent 3. Mild PAH  4. Relatively well compensated L-sided pressures and cardiac output on milrinone 5. Prominent v-wave (25) in PCWP tracing c/w known MR   Plan/Discussion:  Medical therapy. Proceed with TEE to further evaluate MV.  - Echo (4/23): EF 30-35%, mild LVH, D-shaped LV c/w RV volume overload, RV mildly enlarged w/ moderately reduced systolic function w/ severely elevated RVSP 66.0 mmHg. LA moderately moderately dilated, RA severely dilated, Mod AI, severe TR.   - Echo (6/21): EF 45-50%  - Echo (10/18): EF 25-30%  Past Medical History:  Diagnosis Date   Atrial fibrillation ([place]) 03/21/2016   Current Outpatient Medications  Medication Sig Dispense Refill   aspirin 81 MG chewable tablet Chew 1 tablet (81 mg total) by mouth daily. 30 tablet 0   atorvastatin (LIPITOR) 80 MG tablet TAKE 1 TABLET BY MOUTH DAILY AT 6 PM. 90 tablet 3   carvedilol (COREG) 25 MG tablet TAKE 1 TABLET (25 MG) BY MOUTH IN THE MORNING AND AT BEDTIME 180 tablet 1   clopidogrel (PLAVIX) 75 MG tablet Take 1 tablet (75 mg total) by mouth daily. 90 tablet 3   dapagliflozin propanediol (FARXIGA) 10 MG TABS tablet Take 1 tablet (10 mg total) by mouth daily before breakfast. 30 tablet 11   furosemide (LASIX) 40 MG tablet TAKE 1.5 TABLETS (60 MG TOTAL) BY MOUTH 2 (TWO) TIMES DAILY. 270 tablet 1   losartan (COZAAR) 25 MG tablet Take 0.5 tablets (12.5 mg total) by mouth at bedtime. 45 tablet 3   potassium chloride SA (KLOR-CON M) 20 MEQ tablet Take 1 tablet (20 mEq total) by mouth daily. 30 tablet 6   spironolactone (ALDACTONE) 25 MG tablet Take 0.5 tablets (12.5 mg total) by mouth at bedtime. 90 tablet 1   No current facility-administered medications for this encounter.   No Known Allergies  Social History   Socioeconomic History   Marital status: Married    Spouse name: Jong Nells   Number of children: 2   Years of education: Not on file   Highest education level: High school graduate   Occupational History   Occupation: Retired    Comment: Architect  Tobacco Use   Smoking status: Former   Smokeless tobacco: Former   Tobacco comments:    Quit Neurosurgeon Use: Never used  Substance and Sexual Activity   Alcohol use: Never   Drug use: Not Currently   Sexual activity: Not on file  Other Topics Concern   Not on file  Social History Narrative   Not on file   Social Determinants of Health   Financial Resource Strain: Low Risk  (03/19/2022)   Overall Financial Resource Strain (CARDIA)    Difficulty of Paying Living Expenses: Not hard at all  Food Insecurity: No Food Insecurity (03/19/2022)   Hunger Vital Sign    Worried About Running Out of Food in the Last Year: Never true    [place] in the  Last Year: Never true  Transportation Needs: No Transportation Needs (03/19/2022)   PRAPARE - Hydrologist (Medical): No    Lack of Transportation (Non-Medical): No  Physical Activity: Not on file  Stress: Not on file  Social Connections: Not on file  Intimate Partner Violence: Not on file    No family history on file.  BP 120/80   Pulse 71   Wt 55.8 kg (123 lb)   SpO2 97%   BMI 22.50 kg/m   Wt Readings from Last 3 Encounters:  11/18/22 55.8 kg (123 lb)  08/04/22 51.6 kg (113 lb 12.8 oz)  05/27/22 55 kg (121 lb 3.2 oz)   PHYSICAL EXAM: General:  NAD. No resp difficulty, walked into clinic, elderly HEENT: Normal Neck: Supple. No JVD. Carotids 2+ bilat; no bruits. No lymphadenopathy or thryomegaly appreciated. Cor: PMI  nondisplaced. Irregular rate & rhythm. No rubs, gallops, 2/6 MR  Lungs: Clear Abdomen: Soft, nontender, nondistended. No hepatosplenomegaly. No bruits or masses. Good bowel sounds. Extremities: No cyanosis, clubbing, rash, edema Neuro: Alert & oriented x 3, cranial nerves grossly intact. Moves all 4 extremities w/o difficulty. Affect pleasant.  REDs: 25%  ASSESSMENT & PLAN: 1. Chronic  Biventricular Heart Failure - Ischemic CM, severe MVCAD s/p CABG in 2017 - Echo 2017 EF 25-30%, RV normal  - Echo 2018 EF 20-25%, RV mildly reduced - Echo 2021 EF improved 45-50%, RV normal  - Echo 4/23 EF 30-35%, diffuse HK, RV moderately reduced>>Admit for NYHA Class IIIb w/ marked fluid overload c/b worsening renal function w/ diuresis. Started on empiric milrinone 0.125 to augment diuresis. RHC/LHC on milrinone showed severe 3V disease patent grafts and preserved cardiac output w/ compensated filling pressures   - Echo 8/23, EF 25-30%, severe MR (continues to refuse MitraClip)  - Improved NYHA II, he is not volume overloaded today, weight up 10 lbs but his appetite is better. ReDs 25% - Continue losartan 12.5 mg qhs.   - Continue spiro 12.5 mg qhs. - Continue Lasix 60 mg bid. - Continue carvedilol 25 mg bid. - Continue Farxiga 10 mg daily  - Labs today.  2. Severe Mitral Regurgitation  - severe on 2D echo 6/21 w/ posterior leaflet tethering - suspect mostly functional, in the setting of severe dilated CM + chronic afib  - TEE (4/23) confirmed severe mitral insufficiency - He has seen Structural Team and declined MitraClip. - Echo 8/23 shows persistent severe MR, though PA pressures much improved, 37.4 mmHg. Continues to decline MitraClip  - Continue to optimize from CHF standpoint   3. Severe Tricuspid Regurgitation  - Functional, from dilated RV - HF optimization per above.    4. CAD - s/p CABG x 4 in 2017 (LIMA to LAD, SVG to OM, SVG to PDA, and SVG to diagonal) - LHC (4/23) w/ 4/4 patent grafts  - No chest pain.  - Continue medical therapy, on asa + statin    5. Permanent Atrial Fibrillation  - Chronic since 2017, failed cardioversion - LAA clip at time of CABG in 2017 - No longer on a/c given h/o frequent nose bleeds - Rate controlled. HR 70s   6. Stage IIIa CKD - SCr baseline  1.3-1.7 - BMET today.  7. H/o Pleural Effusion  - s/p Rt Thoracentesis 03/17/22 w/ 1.3L  fluid removal  - Lung exam clear today. Oxygen 97% on room air.   8. PAD - h/o severe carotid artery stenosis, s/p left CEA - LE PAD w/ evidence  of multilevel arterial occlusive disease - Reduced ABI on rt, c/w moderate disease. Stable w/o claudication  - Followed by Dr. Scot Dock. - Check BP in LUE  Follow up in 3-4 months with Dr. Haroldine Laws.  [place], FNP 11/18/22

## 2022-11-18 ENCOUNTER — Ambulatory Visit (HOSPITAL_COMMUNITY)
Admission: RE | Admit: 2022-11-18 | Discharge: 2022-11-18 | Disposition: A | Discharge status: Home / Self Care | Payer: Medicare HMO | Source: Ambulatory Visit | Attending: Family Medicine | Admitting: Family Medicine

## 2022-11-18 ENCOUNTER — Encounter (HOSPITAL_COMMUNITY): Payer: Self-pay

## 2022-11-18 VITALS — BP 120/80 | HR 71 | Wt 123.0 lb

## 2022-11-18 DIAGNOSIS — I071 Rheumatic tricuspid insufficiency: Secondary | ICD-10-CM | POA: Diagnosis not present

## 2022-11-18 DIAGNOSIS — I25810 Atherosclerosis of coronary artery bypass graft(s) without angina pectoris: Secondary | ICD-10-CM | POA: Diagnosis not present

## 2022-11-18 DIAGNOSIS — I255 Ischemic cardiomyopathy: Secondary | ICD-10-CM | POA: Insufficient documentation

## 2022-11-18 DIAGNOSIS — I4821 Permanent atrial fibrillation: Secondary | ICD-10-CM | POA: Insufficient documentation

## 2022-11-18 DIAGNOSIS — I5082 Biventricular heart failure: Secondary | ICD-10-CM | POA: Insufficient documentation

## 2022-11-18 DIAGNOSIS — Z951 Presence of aortocoronary bypass graft: Secondary | ICD-10-CM | POA: Diagnosis not present

## 2022-11-18 DIAGNOSIS — N1831 Chronic kidney disease, stage 3a: Secondary | ICD-10-CM | POA: Diagnosis not present

## 2022-11-18 DIAGNOSIS — I739 Peripheral vascular disease, unspecified: Secondary | ICD-10-CM

## 2022-11-18 DIAGNOSIS — Z79899 Other long term (current) drug therapy: Secondary | ICD-10-CM | POA: Diagnosis not present

## 2022-11-18 DIAGNOSIS — I2510 Atherosclerotic heart disease of native coronary artery without angina pectoris: Secondary | ICD-10-CM | POA: Diagnosis not present

## 2022-11-18 DIAGNOSIS — I5022 Chronic systolic (congestive) heart failure: Secondary | ICD-10-CM | POA: Diagnosis not present

## 2022-11-18 DIAGNOSIS — Z9889 Other specified postprocedural states: Secondary | ICD-10-CM | POA: Insufficient documentation

## 2022-11-18 DIAGNOSIS — I4819 Other persistent atrial fibrillation: Secondary | ICD-10-CM

## 2022-11-18 DIAGNOSIS — I083 Combined rheumatic disorders of mitral, aortic and tricuspid valves: Secondary | ICD-10-CM | POA: Insufficient documentation

## 2022-11-18 DIAGNOSIS — N183 Chronic kidney disease, stage 3 unspecified: Secondary | ICD-10-CM | POA: Diagnosis not present

## 2022-11-18 DIAGNOSIS — I340 Nonrheumatic mitral (valve) insufficiency: Secondary | ICD-10-CM

## 2022-11-18 DIAGNOSIS — I130 Hypertensive heart and chronic kidney disease with heart failure and stage 1 through stage 4 chronic kidney disease, or unspecified chronic kidney disease: Secondary | ICD-10-CM | POA: Diagnosis not present

## 2022-11-18 LAB — BASIC METABOLIC PANEL
Anion gap: 10 (ref 5–15)
BUN: 62 mg/dL — ABNORMAL HIGH (ref 8–23)
CO2: 23 mmol/L (ref 22–32)
Calcium: 10.1 mg/dL (ref 8.9–10.3)
Chloride: 103 mmol/L (ref 98–111)
Creatinine, Ser: 1.74 mg/dL — ABNORMAL HIGH (ref 0.61–1.24)
GFR, Estimated: 39 mL/min — ABNORMAL LOW (ref >60)
Glucose, Bld: 79 mg/dL (ref 70–99)
Potassium: 4.3 mmol/L (ref 3.5–5.1)
Sodium: 136 mmol/L (ref 135–145)

## 2022-11-18 NOTE — Patient Instructions (Signed)
There has been no changes to your medications..  Labs done today, your results will be available in MyChart, we will contact you for abnormal readings.  Your physician recommends that you schedule a follow-up appointment in: 3 months   If you have any questions or concerns before your next appointment please send us a message through mychart or call our office at [phone].    TO LEAVE A MESSAGE FOR THE NURSE SELECT OPTION 2, PLEASE LEAVE A MESSAGE INCLUDING: YOUR NAME DATE OF BIRTH CALL BACK NUMBER REASON FOR CALL**this is important as we prioritize the call backs  YOU WILL RECEIVE A CALL BACK THE SAME DAY AS LONG AS YOU CALL BEFORE 4:00 PM  At the [facility], you and your health needs are our priority. As part of our continuing mission to provide you with exceptional heart care, we have created designated Provider Care Teams. These Care Teams include your primary Cardiologist (physician) and Advanced Practice Providers (APPs- Physician Assistants and Nurse Practitioners) who all work together to provide you with the care you need, when you need it.   You may see any of the following providers on your designated Care Team at your next follow up: Dr Daniel Bensimhon Dr Dalton McLean Dr. Aditya Sabharwal Amy Clegg, NP Brittainy Simmons, PA Jessica Milford,NP Lindsay Finch, PA Alma Diaz, NP Lauren Kemp, PharmD   Please be sure to bring in all your medications bottles to every appointment.    

## 2022-11-18 NOTE — Progress Notes (Signed)
ReDS Vest / Clip - 11/18/22 1000       ReDS Vest / Clip   Station Marker A    Ruler Value 23    ReDS Value Range Moderate volume overload    ReDS Actual Value 25

## 2022-12-03 DIAGNOSIS — I25810 Atherosclerosis of coronary artery bypass graft(s) without angina pectoris: Secondary | ICD-10-CM | POA: Diagnosis not present

## 2022-12-03 DIAGNOSIS — I240 Acute coronary thrombosis not resulting in myocardial infarction: Secondary | ICD-10-CM | POA: Diagnosis not present

## 2022-12-03 DIAGNOSIS — I4819 Other persistent atrial fibrillation: Secondary | ICD-10-CM | POA: Diagnosis not present

## 2022-12-03 DIAGNOSIS — I340 Nonrheumatic mitral (valve) insufficiency: Secondary | ICD-10-CM | POA: Diagnosis not present

## 2022-12-03 DIAGNOSIS — I5042 Chronic combined systolic (congestive) and diastolic (congestive) heart failure: Secondary | ICD-10-CM | POA: Diagnosis not present

## 2022-12-03 DIAGNOSIS — N1831 Chronic kidney disease, stage 3a: Secondary | ICD-10-CM | POA: Diagnosis not present

## 2022-12-03 DIAGNOSIS — Z0000 Encounter for general adult medical examination without abnormal findings: Secondary | ICD-10-CM | POA: Diagnosis not present

## 2022-12-03 DIAGNOSIS — E7800 Pure hypercholesterolemia, unspecified: Secondary | ICD-10-CM | POA: Diagnosis not present

## 2022-12-28 ENCOUNTER — Other Ambulatory Visit (HOSPITAL_COMMUNITY): Payer: Self-pay | Admitting: Cardiology

## 2023-03-07 ENCOUNTER — Other Ambulatory Visit (HOSPITAL_COMMUNITY): Payer: Self-pay | Admitting: Internal Medicine

## 2023-03-07 NOTE — Progress Notes (Signed)
Advanced Heart Failure Clinic Note    PCP: Orpah Melter, MD Primary Cardiologist: Shelva Majestic, MD  HF Cardiologist: Dr. Haroldine Laws  Structural Heart: Dr. Burt Knack   HPI: [name] [name] 82 y.o. male w/ chronic systolic heart failure due to [facility], CAD s/p CABG x 4 (2017) permanent AF, severe MR, HTN, PAD and Stage III CKD.    CABG 2017 by Dr. Prescott Gum (LIMA-LAD, SVG-OM, SVG-PDA, SVG -D1+ LAA clip and left CEA for severe carotid stenosis.    Has had chronic Afib.. Had cardioversion in 2017 but w/ early recurrence. Now rate controlled on ? blocker. No longer on a/c given frequent epistqxis. Has refused retrial.    Echo 10/18 EF 25-30% G3DD Mod MR/AI. RVSP 85   Echo 6/21 EF 45-50% with inferior to inferolateral hypokinesis.  Severe biatrial enlargement. Moderate to severe mitral regurgitation, mild to moderate tricuspid regurgitation and mild aortic insufficiency.  Admitted 4/23 ADHF. Echo EF 30-35% sev MR/TR, mod AI D-shaped septum. RV mod HK. RVSP 66. Poor response to IV Lasix. Placed on empiric milrinone. TEE showed mod-sev MR mostly at A2-P2. RHC/LHC on milrinone 0.125, with 3vCAD 4/4 patent grafts, mild PAH, relatively well compensated L-sided pressures and CO on milrinone. v-wave (25) in PCWP tracing c/w known MR. Structural heart team consulted.   Saw Dr. Burt Knack 5/23 and patient decided against MitraClip.   Echo 8/23 EF ~25-30% w/ severe MR but PA pressures much improved.   Today he returns for HF follow up with his wife.   Cardiac Studies: - Echo (8/23): EF 25-30%, severe MR due to severe LAE and posterior leaflet restriction.  - TEE (4/23): EF 30-35%, septum flattened in diastole, RV severely reduced, moderate to severe mitral insufficiency, mostly at A2-P2, severe TR  - R/LHC (4/23): on milrinone 0.125 mcg/kg/min    Prox RCA to Mid RCA lesion is 100% stenosed.   Prox Cx to Mid Cx lesion is 50% stenosed.   2nd Mrg lesion is 100% stenosed.   Prox Graft to Mid Graft lesion is  30% stenosed.   Mid LAD lesion is 70% stenosed.   2nd Diag lesion is 100% stenosed.   Ao = 158/64 (98) LV = 159/10 RA = 5 RV = 49/5 PA = 52/16 (13) PCW = 16 (v = 25) Fick cardiac output/index = 4.6/2.9 PVR = 3.7 WU FA sat = 99% PA sat = 73%, 75% SVC 74%   1. Severe native 3v CAD 2. 4/4 grafts patent 3. Mild PAH  4. Relatively well compensated L-sided pressures and cardiac output on milrinone 5. Prominent v-wave (25) in PCWP tracing c/w known MR   Plan/Discussion:  Medical therapy. Proceed with TEE to further evaluate MV.  - Echo (4/23): EF 30-35%, mild LVH, D-shaped LV c/w RV volume overload, RV mildly enlarged w/ moderately reduced systolic function w/ severely elevated RVSP 66.0 mmHg. LA moderately moderately dilated, RA severely dilated, Mod AI, severe TR.   - Echo (6/21): EF 45-50%  - Echo (10/18): EF 25-30%  Past Medical History:  Diagnosis Date   Atrial fibrillation ([place]) 03/21/2016   Current Outpatient Medications  Medication Sig Dispense Refill   aspirin 81 MG chewable tablet Chew 1 tablet (81 mg total) by mouth daily. 30 tablet 0   atorvastatin (LIPITOR) 80 MG tablet TAKE 1 TABLET BY MOUTH DAILY AT 6 PM. 90 tablet 3   carvedilol (COREG) 25 MG tablet TAKE 1 TABLET (25 MG) BY MOUTH IN THE MORNING AND AT BEDTIME 180 tablet 1   clopidogrel (PLAVIX)  75 MG tablet Take 1 tablet (75 mg total) by mouth daily. 90 tablet 3   dapagliflozin propanediol (FARXIGA) 10 MG TABS tablet Take 1 tablet (10 mg total) by mouth daily before breakfast. 30 tablet 11   furosemide (LASIX) 40 MG tablet TAKE 1.5 TABLETS (60 MG TOTAL) BY MOUTH 2 (TWO) TIMES DAILY. 270 tablet 1   losartan (COZAAR) 25 MG tablet Take 0.5 tablets (12.5 mg total) by mouth at bedtime. 45 tablet 3   potassium chloride SA (KLOR-CON M) 20 MEQ tablet Take 1 tablet (20 mEq total) by mouth daily. 30 tablet 6   spironolactone (ALDACTONE) 25 MG tablet Take 0.5 tablets (12.5 mg total) by mouth at bedtime. 90 tablet 1   No  current facility-administered medications for this encounter.   No Known Allergies  Social History   Socioeconomic History   Marital status: Married    Spouse name: [name] [name]   Number of children: 2   Years of education: Not on file   Highest education level: High school graduate  Occupational History   Occupation: Retired    Comment: Architect  Tobacco Use   Smoking status: Former   Smokeless tobacco: Former   Tobacco comments:    Quit Neurosurgeon Use: Never used  Substance and Sexual Activity   Alcohol use: Never   Drug use: Not Currently   Sexual activity: Not on file  Other Topics Concern   Not on file  Social History Narrative   Not on file   Social Determinants of Health   Financial Resource Strain: Low Risk  (03/19/2022)   Overall Financial Resource Strain (CARDIA)    Difficulty of Paying Living Expenses: Not hard at all  Food Insecurity: No Food Insecurity (03/19/2022)   Hunger Vital Sign    Worried About Running Out of Food in the Last Year: Never true    [place] in the Last Year: Never true  Transportation Needs: No Transportation Needs (03/19/2022)   PRAPARE - Hydrologist (Medical): No    Lack of Transportation (Non-Medical): No  Physical Activity: Not on file  Stress: Not on file  Social Connections: Not on file  Intimate Partner Violence: Not on file    No family history on file.  BP 120/80   Pulse 71   Wt 55.8 kg (123 lb)   SpO2 97%   BMI 22.50 kg/m   Wt Readings from Last 3 Encounters:  11/18/22 55.8 kg (123 lb)  08/04/22 51.6 kg (113 lb 12.8 oz)  05/27/22 55 kg (121 lb 3.2 oz)   PHYSICAL EXAM: General:  NAD. No resp difficulty, walked into clinic, elderly HEENT: Normal Neck: Supple. No JVD. Carotids 2+ bilat; no bruits. No lymphadenopathy or thryomegaly appreciated. Cor: PMI  nondisplaced. Irregular rate & rhythm. No rubs, gallops, 2/6 MR  Lungs: Clear Abdomen: Soft, nontender,  nondistended. No hepatosplenomegaly. No bruits or masses. Good bowel sounds. Extremities: No cyanosis, clubbing, rash, edema Neuro: Alert & oriented x 3, cranial nerves grossly intact. Moves all 4 extremities w/o difficulty. Affect pleasant.  REDs: 25%  ASSESSMENT & PLAN: 1. Chronic Biventricular Heart Failure - Ischemic CM, severe MVCAD s/p CABG in 2017 - Echo 2017 EF 25-30%, RV normal  - Echo 2018 EF 20-25%, RV mildly reduced - Echo 2021 EF improved 45-50%, RV normal  - Echo 4/23 EF 30-35%, diffuse HK, RV moderately reduced>>Admit for NYHA Class IIIb w/ marked fluid overload c/b worsening renal  function w/ diuresis. Started on empiric milrinone 0.125 to augment diuresis. RHC/LHC on milrinone showed severe 3V disease patent grafts and preserved cardiac output w/ compensated filling pressures   - Echo 8/23, EF 25-30%, severe MR (continues to refuse MitraClip)  - Improved NYHA II, he is not volume overloaded today, weight up 10 lbs but his appetite is better. ReDs 25% - Continue losartan 12.5 mg qhs.   - Continue spiro 12.5 mg qhs. - Continue Lasix 60 mg bid. - Continue carvedilol 25 mg bid. - Continue Farxiga 10 mg daily  - Labs today.  2. Severe Mitral Regurgitation  - severe on 2D echo 6/21 w/ posterior leaflet tethering - suspect mostly functional, in the setting of severe dilated CM + chronic afib  - TEE (4/23) confirmed severe mitral insufficiency - He has seen Structural Team and declined MitraClip. - Echo 8/23 shows persistent severe MR, though PA pressures much improved, 37.4 mmHg. Continues to decline MitraClip  - Continue to optimize from CHF standpoint   3. Severe Tricuspid Regurgitation  - Functional, from dilated RV - HF optimization per above.    4. CAD - s/p CABG x 4 in 2017 (LIMA to LAD, SVG to OM, SVG to PDA, and SVG to diagonal) - LHC (4/23) w/ 4/4 patent grafts  - No chest pain.  - Continue medical therapy, on asa + statin    5. Permanent Atrial Fibrillation   - Chronic since 2017, failed cardioversion - LAA clip at time of CABG in 2017 - No longer on a/c given h/o frequent nose bleeds - Rate controlled. HR 70s   6. Stage IIIa CKD - SCr baseline  1.3-1.7 - BMET today.  7. H/o Pleural Effusion  - s/p Rt Thoracentesis 03/17/22 w/ 1.3L fluid removal  - Lung exam clear today. Oxygen 97% on room air.   8. PAD - h/o severe carotid artery stenosis, s/p left CEA - LE PAD w/ evidence of multilevel arterial occlusive disease - Reduced ABI on rt, c/w moderate disease. Stable w/o claudication  - Followed by Dr. Scot Dock. - Check BP in LUE  Glori Bickers, MD  10:58 PM

## 2023-03-08 ENCOUNTER — Inpatient Hospital Stay (HOSPITAL_COMMUNITY)
Admission: RE | Admit: 2023-03-08 | Discharge: 2023-03-08 | Disposition: A | Discharge status: Home / Self Care | Payer: Medicare HMO | Source: Ambulatory Visit | Attending: Internal Medicine | Admitting: Internal Medicine

## 2023-03-26 ENCOUNTER — Telehealth (HOSPITAL_COMMUNITY): Payer: Self-pay | Admitting: Licensed Clinical Social Worker

## 2023-03-26 NOTE — Telephone Encounter (Signed)
Entered in error

## 2023-03-29 ENCOUNTER — Encounter (HOSPITAL_COMMUNITY): Payer: Medicare HMO | Admitting: Internal Medicine

## 2023-05-31 ENCOUNTER — Inpatient Hospital Stay (HOSPITAL_COMMUNITY)
Admission: EM | Admit: 2023-05-31 | Discharge: 2023-06-03 | DRG: 871 | Disposition: A | Discharge status: Home Health | Payer: Medicare HMO | Attending: Family Medicine | Admitting: Family Medicine

## 2023-05-31 ENCOUNTER — Emergency Department (HOSPITAL_COMMUNITY): Payer: Medicare HMO

## 2023-05-31 ENCOUNTER — Other Ambulatory Visit: Payer: Self-pay

## 2023-05-31 ENCOUNTER — Encounter (HOSPITAL_COMMUNITY): Payer: Self-pay

## 2023-05-31 DIAGNOSIS — N3001 Acute cystitis with hematuria: Secondary | ICD-10-CM

## 2023-05-31 DIAGNOSIS — W1830XA Fall on same level, unspecified, initial encounter: Secondary | ICD-10-CM | POA: Diagnosis present

## 2023-05-31 DIAGNOSIS — R7989 Other specified abnormal findings of blood chemistry: Secondary | ICD-10-CM | POA: Diagnosis not present

## 2023-05-31 DIAGNOSIS — I2510 Atherosclerotic heart disease of native coronary artery without angina pectoris: Secondary | ICD-10-CM | POA: Diagnosis present

## 2023-05-31 DIAGNOSIS — I482 Chronic atrial fibrillation, unspecified: Secondary | ICD-10-CM | POA: Diagnosis not present

## 2023-05-31 DIAGNOSIS — N1832 Chronic kidney disease, stage 3b: Secondary | ICD-10-CM | POA: Diagnosis not present

## 2023-05-31 DIAGNOSIS — A419 Sepsis, unspecified organism: Principal | ICD-10-CM | POA: Diagnosis present

## 2023-05-31 DIAGNOSIS — E860 Dehydration: Secondary | ICD-10-CM | POA: Diagnosis not present

## 2023-05-31 DIAGNOSIS — I4891 Unspecified atrial fibrillation: Secondary | ICD-10-CM | POA: Diagnosis not present

## 2023-05-31 DIAGNOSIS — X30XXXA Exposure to excessive natural heat, initial encounter: Secondary | ICD-10-CM

## 2023-05-31 DIAGNOSIS — N19 Unspecified kidney failure: Secondary | ICD-10-CM | POA: Diagnosis present

## 2023-05-31 DIAGNOSIS — Z66 Do not resuscitate: Secondary | ICD-10-CM | POA: Diagnosis not present

## 2023-05-31 DIAGNOSIS — E785 Hyperlipidemia, unspecified: Secondary | ICD-10-CM | POA: Diagnosis present

## 2023-05-31 DIAGNOSIS — R000 Tachycardia, unspecified: Secondary | ICD-10-CM | POA: Diagnosis not present

## 2023-05-31 DIAGNOSIS — Z87891 Personal history of nicotine dependence: Secondary | ICD-10-CM

## 2023-05-31 DIAGNOSIS — Z7982 Long term (current) use of aspirin: Secondary | ICD-10-CM | POA: Diagnosis not present

## 2023-05-31 DIAGNOSIS — R6520 Severe sepsis without septic shock: Secondary | ICD-10-CM | POA: Diagnosis present

## 2023-05-31 DIAGNOSIS — S060X9A Concussion with loss of consciousness of unspecified duration, initial encounter: Secondary | ICD-10-CM | POA: Diagnosis not present

## 2023-05-31 DIAGNOSIS — Z951 Presence of aortocoronary bypass graft: Secondary | ICD-10-CM

## 2023-05-31 DIAGNOSIS — I5022 Chronic systolic (congestive) heart failure: Secondary | ICD-10-CM | POA: Diagnosis present

## 2023-05-31 DIAGNOSIS — R55 Syncope and collapse: Principal | ICD-10-CM

## 2023-05-31 DIAGNOSIS — Y92007 Garden or yard of unspecified non-institutional (private) residence as the place of occurrence of the external cause: Secondary | ICD-10-CM

## 2023-05-31 DIAGNOSIS — Z9183 Wandering in diseases classified elsewhere: Secondary | ICD-10-CM

## 2023-05-31 DIAGNOSIS — G928 Other toxic encephalopathy: Secondary | ICD-10-CM | POA: Diagnosis present

## 2023-05-31 DIAGNOSIS — Z043 Encounter for examination and observation following other accident: Secondary | ICD-10-CM | POA: Diagnosis not present

## 2023-05-31 DIAGNOSIS — N3000 Acute cystitis without hematuria: Secondary | ICD-10-CM

## 2023-05-31 DIAGNOSIS — I480 Paroxysmal atrial fibrillation: Secondary | ICD-10-CM | POA: Diagnosis not present

## 2023-05-31 DIAGNOSIS — Z7902 Long term (current) use of antithrombotics/antiplatelets: Secondary | ICD-10-CM

## 2023-05-31 DIAGNOSIS — G9341 Metabolic encephalopathy: Secondary | ICD-10-CM | POA: Diagnosis not present

## 2023-05-31 DIAGNOSIS — T675XXA Heat exhaustion, unspecified, initial encounter: Secondary | ICD-10-CM | POA: Diagnosis present

## 2023-05-31 DIAGNOSIS — R54 Age-related physical debility: Secondary | ICD-10-CM | POA: Diagnosis present

## 2023-05-31 DIAGNOSIS — E8809 Other disorders of plasma-protein metabolism, not elsewhere classified: Secondary | ICD-10-CM | POA: Diagnosis present

## 2023-05-31 DIAGNOSIS — Z79899 Other long term (current) drug therapy: Secondary | ICD-10-CM

## 2023-05-31 DIAGNOSIS — N179 Acute kidney failure, unspecified: Secondary | ICD-10-CM | POA: Diagnosis not present

## 2023-05-31 DIAGNOSIS — E782 Mixed hyperlipidemia: Secondary | ICD-10-CM

## 2023-05-31 DIAGNOSIS — R61 Generalized hyperhidrosis: Secondary | ICD-10-CM | POA: Diagnosis not present

## 2023-05-31 HISTORY — DX: Chronic systolic (congestive) heart failure: I50.22

## 2023-05-31 LAB — URINALYSIS, W/ REFLEX TO CULTURE (INFECTION SUSPECTED)
Bilirubin Urine: NEGATIVE
Glucose, UA: NEGATIVE mg/dL
Ketones, ur: NEGATIVE mg/dL
Nitrite: NEGATIVE
Protein, ur: 30 mg/dL — AB
Specific Gravity, Urine: 1.015 (ref 1.005–1.030)
pH: 5 (ref 5.0–8.0)

## 2023-05-31 LAB — CK: Total CK: 45 U/L — ABNORMAL LOW (ref 49–397)

## 2023-05-31 LAB — CBC WITH DIFFERENTIAL/PLATELET
Abs Immature Granulocytes: 0.08 10*3/uL — ABNORMAL HIGH (ref 0.00–0.07)
Basophils Absolute: 0.1 10*3/uL (ref 0.0–0.1)
Basophils Relative: 0 %
Eosinophils Absolute: 0 10*3/uL (ref 0.0–0.5)
Eosinophils Relative: 0 %
HCT: 42.4 % (ref 39.0–52.0)
Hemoglobin: 14 g/dL (ref 13.0–17.0)
Immature Granulocytes: 0 %
Lymphocytes Relative: 4 %
Lymphs Abs: 0.8 10*3/uL (ref 0.7–4.0)
MCH: 33.2 pg (ref 26.0–34.0)
MCHC: 33 g/dL (ref 30.0–36.0)
MCV: 100.5 fL — ABNORMAL HIGH (ref 80.0–100.0)
Monocytes Absolute: 0.8 10*3/uL (ref 0.1–1.0)
Monocytes Relative: 4 %
Neutro Abs: 17.8 10*3/uL — ABNORMAL HIGH (ref 1.7–7.7)
Neutrophils Relative %: 92 %
Platelets: 215 10*3/uL (ref 150–400)
RBC: 4.22 MIL/uL (ref 4.22–5.81)
RDW: 13.9 % (ref 11.5–15.5)
WBC: 19.6 10*3/uL — ABNORMAL HIGH (ref 4.0–10.5)
nRBC: 0 % (ref 0.0–0.2)

## 2023-05-31 LAB — COMPREHENSIVE METABOLIC PANEL
ALT: 22 U/L (ref 0–44)
AST: 31 U/L (ref 15–41)
Albumin: 3.5 g/dL (ref 3.5–5.0)
Alkaline Phosphatase: 86 U/L (ref 38–126)
Anion gap: 8 (ref 5–15)
BUN: 41 mg/dL — ABNORMAL HIGH (ref 8–23)
CO2: 19 mmol/L — ABNORMAL LOW (ref 22–32)
Calcium: 10.4 mg/dL — ABNORMAL HIGH (ref 8.9–10.3)
Chloride: 110 mmol/L (ref 98–111)
Creatinine, Ser: 1.67 mg/dL — ABNORMAL HIGH (ref 0.61–1.24)
GFR, Estimated: 41 mL/min — ABNORMAL LOW (ref >60)
Glucose, Bld: 118 mg/dL — ABNORMAL HIGH (ref 70–99)
Potassium: 4.5 mmol/L (ref 3.5–5.1)
Sodium: 137 mmol/L (ref 135–145)
Total Bilirubin: 0.6 mg/dL (ref 0.3–1.2)
Total Protein: 7.5 g/dL (ref 6.5–8.1)

## 2023-05-31 LAB — TROPONIN I (HIGH SENSITIVITY): Troponin I (High Sensitivity): 79 ng/L — ABNORMAL HIGH (ref <18)

## 2023-05-31 LAB — LACTIC ACID, PLASMA: Lactic Acid, Venous: 1.5 mmol/L (ref 0.5–1.9)

## 2023-05-31 LAB — BRAIN NATRIURETIC PEPTIDE: B Natriuretic Peptide: 410.6 pg/mL — ABNORMAL HIGH (ref 0.0–100.0)

## 2023-05-31 MED ORDER — LACTATED RINGERS IV BOLUS
500.0000 mL | Freq: Once | INTRAVENOUS | Status: AC
  Administered 2023-05-31: 500 mL via INTRAVENOUS

## 2023-05-31 MED ORDER — ASPIRIN 81 MG PO CHEW
81.0000 mg | CHEWABLE_TABLET | Freq: Every day | ORAL | Status: DC
  Administered 2023-06-01 – 2023-06-03 (×3): 81 mg via ORAL
  Filled 2023-05-31 (×3): qty 1

## 2023-05-31 MED ORDER — MELATONIN 3 MG PO TABS
3.0000 mg | ORAL_TABLET | Freq: Every evening | ORAL | Status: DC | PRN
  Filled 2023-05-31: qty 1

## 2023-05-31 MED ORDER — ATORVASTATIN CALCIUM 80 MG PO TABS
80.0000 mg | ORAL_TABLET | Freq: Every day | ORAL | Status: DC
  Administered 2023-06-01 – 2023-06-03 (×3): 80 mg via ORAL
  Filled 2023-05-31 (×3): qty 1

## 2023-05-31 MED ORDER — SODIUM CHLORIDE 0.9 % IV SOLN
1.0000 g | Freq: Once | INTRAVENOUS | Status: AC
  Administered 2023-05-31: 1 g via INTRAVENOUS
  Filled 2023-05-31: qty 10

## 2023-05-31 MED ORDER — ONDANSETRON HCL 4 MG/2ML IJ SOLN: 4.0000 mg | Freq: Four times a day (QID) | INTRAMUSCULAR | Status: DC | PRN

## 2023-05-31 MED ORDER — ACETAMINOPHEN 325 MG PO TABS: 650.0000 mg | ORAL_TABLET | Freq: Four times a day (QID) | ORAL | Status: DC | PRN

## 2023-05-31 MED ORDER — LACTATED RINGERS IV SOLN: INTRAVENOUS | Status: AC

## 2023-05-31 MED ORDER — SODIUM CHLORIDE 0.9 % IV SOLN
1.0000 g | INTRAVENOUS | Status: DC
  Administered 2023-06-01: 1 g via INTRAVENOUS
  Filled 2023-05-31: qty 10

## 2023-05-31 MED ORDER — ACETAMINOPHEN 650 MG RE SUPP: 650.0000 mg | Freq: Four times a day (QID) | RECTAL | Status: DC | PRN

## 2023-05-31 MED ORDER — CARVEDILOL 25 MG PO TABS
25.0000 mg | ORAL_TABLET | Freq: Two times a day (BID) | ORAL | Status: DC
  Administered 2023-06-01: 25 mg via ORAL
  Filled 2023-05-31: qty 1

## 2023-05-31 NOTE — ED Provider Notes (Signed)
[place] EMERGENCY DEPARTMENT AT [facility] Provider Note   HPI: [name] [name] is an 82 year old male presenting today after an unwitnessed fall in his backyard by his neighbor.  The neighbor reports the patient was digging a hole for 30 to 40 minutes and the patient was last seen around noon.  He lives by himself.  The patient was then found in the backyard by his neighbor.  Patient at present denies chest pain, shortness of breath.  He denies abdominal pain, nausea, vomiting, diarrhea.  He is unsure what happened earlier.  He denies any hip pain.  He denies lower extremity pain.  He reports his son made him come in today.  Past Medical History:  Diagnosis Date   Atrial fibrillation (HCC) 03/21/2016    Past Surgical History:  Procedure Laterality Date   CARDIAC CATHETERIZATION N/A 03/04/2016   Procedure: Right/Left Heart Cath and Coronary Angiography;  Surgeon: Lennette Bihari, MD;  Location: [facility] INVASIVE CV LAB;  Service: Cardiovascular;  Laterality: N/A;   CARDIOVERSION N/A 04/16/2016   Procedure: CARDIOVERSION;  Surgeon: Jake Bathe, MD;  Location: [facility] ENDOSCOPY;  Service: Cardiovascular;  Laterality: N/A;   CLIPPING OF ATRIAL APPENDAGE Left 03/10/2016   Procedure: CLIPPING OF ATRIAL APPENDAGE;  Surgeon: Kerin Perna, MD;  Location: [facility] OR;  Service: Open Heart Surgery;  Laterality: Left;   CORONARY ARTERY BYPASS GRAFT N/A 03/10/2016   Procedure: CORONARY ARTERY BYPASS GRAFTING (CABG)TIMES 4 USING LEFT INTERNAL MAMMARY AND BILATERAL GREATER SAPHENOUS VEIN HARVESTED BY ENDOVIEN;  Surgeon: Kerin Perna, MD;  Location: [facility] OR;  Service: Open Heart Surgery;  Laterality: N/A;   ENDARTERECTOMY Left 03/10/2016   Procedure: LEFT ENDARTERECTOMY CAROTID DACRON PATCH ANGIOPLASTY;  Surgeon: Sherren Kerns, MD;  Location: [facility] OR;  Service: Vascular;  Laterality: Left;   IR THORACENTESIS ASP PLEURAL SPACE W/IMG GUIDE  03/17/2022   RIGHT/LEFT HEART CATH AND CORONARY/GRAFT ANGIOGRAPHY N/A  03/24/2022   Procedure: RIGHT/LEFT HEART CATH AND CORONARY/GRAFT ANGIOGRAPHY;  Surgeon: Dolores Patty, MD;  Location: [facility] INVASIVE CV LAB;  Service: Cardiovascular;  Laterality: N/A;   TEE WITHOUT CARDIOVERSION N/A 03/10/2016   Procedure: TRANSESOPHAGEAL ECHOCARDIOGRAM (TEE);  Surgeon: Kerin Perna, MD;  Location: [facility] OR;  Service: Open Heart Surgery;  Laterality: N/A;   TEE WITHOUT CARDIOVERSION N/A 03/24/2022   Procedure: TRANSESOPHAGEAL ECHOCARDIOGRAM (TEE);  Surgeon: Thurmon Fair, MD;  Location: [facility] ENDOSCOPY;  Service: Cardiovascular;  Laterality: N/A;     Social History   Tobacco Use   Smoking status: Former   Smokeless tobacco: Former   Tobacco comments:    [employer] manager Use: Never used  Substance Use Topics   Alcohol use: Never   Drug use: Not Currently      Review of Systems  A complete ROS was performed with pertinent positives/negatives noted in the HPI.   Vitals:   05/31/23 1845 05/31/23 1945  BP: 103/76 106/88  Pulse: 98 95  Resp: (!) 28 (!) 21  Temp:    SpO2: 98% 99%    Physical Exam Vitals and nursing note reviewed.  Constitutional:      General: He is not in acute distress.    Appearance: He is well-developed.  Cardiovascular:     Rate and Rhythm: Normal rate and regular rhythm.     Pulses: Normal pulses.     Heart sounds: Normal heart sounds. No murmur heard.    No friction rub. No gallop.  Pulmonary:  Effort: Pulmonary effort is normal. No respiratory distress.     Breath sounds: Normal breath sounds. No stridor. No wheezing, rhonchi or rales.  Abdominal:     General: Abdomen is flat. There is no distension.     Palpations: Abdomen is soft.     Tenderness: There is no abdominal tenderness. There is no guarding or rebound.  Musculoskeletal:        General: No swelling.     Cervical back: Neck supple.     Comments: No anterior or lateral chest wall tenderness.  No hip tenderness to palpation.  No lower extremity  tenderness palpation.  No upper extremity tenderness palpation.  No C/C/L-spine tenderness to palpation/step-off/deformity.  Skin:    General: Skin is warm and dry.     Capillary Refill: Capillary refill takes less than 2 seconds.  Neurological:     Mental Status: He is alert.  Psychiatric:        Mood and Affect: Mood normal.     Procedures  MDM:  Imaging/radiology results:  DG Pelvis Portable  Result Date: 05/31/2023 CLINICAL DATA:  Syncope and fall. EXAM: PORTABLE PELVIS 1-2 VIEWS COMPARISON:  None Available. FINDINGS: Faint lucency through the neck of the left femur may be artifactual or represent a fracture. Clinical correlation is recommended. No other acute fracture or dislocation on this single provided image. The bones are osteopenic. Mild bilateral hip arthritic changes. Degenerative changes of the lower lumbar spine. Atherosclerotic calcification of the aorta and iliac arteries. The soft tissues are unremarkable IMPRESSION: Artifact versus possible left femoral neck fracture. Clinical correlation is recommended. Electronically Signed   By: Elgie Collard M.D.   On: 05/31/2023 19:42   CT Head Wo Contrast  Result Date: 05/31/2023 CLINICAL DATA:  Fall, loss of consciousness EXAM: CT HEAD WITHOUT CONTRAST CT CERVICAL SPINE WITHOUT CONTRAST TECHNIQUE: Multidetector CT imaging of the head and cervical spine was performed following the standard protocol without intravenous contrast. Multiplanar CT image reconstructions of the cervical spine were also generated. RADIATION DOSE REDUCTION: This exam was performed according to the departmental dose-optimization program which includes automated exposure control, adjustment of the mA and/or kV according to patient size and/or use of iterative reconstruction technique. COMPARISON:  None Available. FINDINGS: CT HEAD FINDINGS Brain: No evidence of acute infarction, hemorrhage, hydrocephalus, extra-axial collection or mass lesion/mass effect.  Extensive periventricular and deep white matter hypodensity. Nonacute encephalomalacia of the right occipital lobe (series 3, image 12). Vascular: No hyperdense vessel or unexpected calcification. Skull: Normal. Negative for fracture or focal lesion. Sinuses/Orbits: No acute finding. Other: None. CT CERVICAL SPINE FINDINGS Alignment: Normal. Skull base and vertebrae: No acute fracture. No primary bone lesion or focal pathologic process. Soft tissues and spinal canal: No prevertebral fluid or swelling. No visible canal hematoma. Disc levels: Severe multilevel disc degenerative disease and osteophytosis throughout the cervical spine, worst from C3-C7. Upper chest: Negative. Other: None. IMPRESSION: 1. No acute intracranial pathology. 2. Advanced small-vessel white matter disease and nonacute encephalomalacia of the right occipital lobe. 3. No fracture or static subluxation of the cervical spine. 4. Severe multilevel disc degenerative disease and osteophytosis throughout the cervical spine, worst from C3-C7. Electronically Signed   By: Jearld Lesch M.D.   On: 05/31/2023 19:41   CT Cervical Spine Wo Contrast  Result Date: 05/31/2023 CLINICAL DATA:  Fall, loss of consciousness EXAM: CT HEAD WITHOUT CONTRAST CT CERVICAL SPINE WITHOUT CONTRAST TECHNIQUE: Multidetector CT imaging of the head and cervical spine was performed following the standard protocol  without intravenous contrast. Multiplanar CT image reconstructions of the cervical spine were also generated. RADIATION DOSE REDUCTION: This exam was performed according to the departmental dose-optimization program which includes automated exposure control, adjustment of the mA and/or kV according to patient size and/or use of iterative reconstruction technique. COMPARISON:  None Available. FINDINGS: CT HEAD FINDINGS Brain: No evidence of acute infarction, hemorrhage, hydrocephalus, extra-axial collection or mass lesion/mass effect. Extensive periventricular and deep  white matter hypodensity. Nonacute encephalomalacia of the right occipital lobe (series 3, image 12). Vascular: No hyperdense vessel or unexpected calcification. Skull: Normal. Negative for fracture or focal lesion. Sinuses/Orbits: No acute finding. Other: None. CT CERVICAL SPINE FINDINGS Alignment: Normal. Skull base and vertebrae: No acute fracture. No primary bone lesion or focal pathologic process. Soft tissues and spinal canal: No prevertebral fluid or swelling. No visible canal hematoma. Disc levels: Severe multilevel disc degenerative disease and osteophytosis throughout the cervical spine, worst from C3-C7. Upper chest: Negative. Other: None. IMPRESSION: 1. No acute intracranial pathology. 2. Advanced small-vessel white matter disease and nonacute encephalomalacia of the right occipital lobe. 3. No fracture or static subluxation of the cervical spine. 4. Severe multilevel disc degenerative disease and osteophytosis throughout the cervical spine, worst from C3-C7. Electronically Signed   By: Jearld Lesch M.D.   On: 05/31/2023 19:41   DG Chest Portable 1 View  Result Date: 05/31/2023 CLINICAL DATA:  Syncope and fall. EXAM: PORTABLE CHEST 1 VIEW COMPARISON:  Chest radiograph dated 03/17/2022. FINDINGS: No focal consolidation, pleural effusion, or pneumothorax. Mild cardiomegaly. Median sternotomy wires and CABG vascular clips. Left atrial appendage occlusion device. Atherosclerotic calcification of the aorta. No acute osseous pathology. IMPRESSION: 1. No acute cardiopulmonary process. 2. Mild cardiomegaly. Electronically Signed   By: Elgie Collard M.D.   On: 05/31/2023 19:40    EKG results: EKG on my interpretation demonstrates atrial fibrillation with nonspecific ST changes.  No evidence of STEMI or ischemic equivalent.    Lab results:  Results for orders placed or performed during the hospital encounter of 05/31/23 (from the past 24 hour(s))  Urinalysis, w/ Reflex to Culture (Infection  Suspected) -Urine, Clean Catch     Status: Abnormal   Collection Time: 05/31/23  7:34 PM  Result Value Ref Range   Specimen Source URINE, CLEAN CATCH    Color, Urine YELLOW YELLOW   APPearance HAZY (A) CLEAR   Specific Gravity, Urine 1.015 1.005 - 1.030   pH 5.0 5.0 - 8.0   Glucose, UA NEGATIVE NEGATIVE mg/dL   Hgb urine dipstick MODERATE (A) NEGATIVE   Bilirubin Urine NEGATIVE NEGATIVE   Ketones, ur NEGATIVE NEGATIVE mg/dL   Protein, ur 30 (A) NEGATIVE mg/dL   Nitrite NEGATIVE NEGATIVE   Leukocytes,Ua MODERATE (A) NEGATIVE   RBC / HPF 0-5 0 - 5 RBC/hpf   WBC, UA 21-50 0 - 5 WBC/hpf   Bacteria, UA MANY (A) NONE SEEN   Squamous Epithelial / HPF 0-5 0 - 5 /HPF   Mucus PRESENT   Urine Culture     Status: None (Preliminary result)   Collection Time: 05/31/23  7:34 PM   Specimen: Urine, Clean Catch  Result Value Ref Range   Specimen Description URINE, CLEAN CATCH    Special Requests      NONE Reflexed from [id number] Performed at [facility], [address] [place], [place] [address]    Culture PENDING    Report Status PENDING   CBC with Differential/Platelet     Status: Abnormal   Collection  Time: 05/31/23  7:36 PM  Result Value Ref Range   WBC 19.6 (H) 4.0 - 10.5 K/uL   RBC 4.22 4.22 - 5.81 MIL/uL   Hemoglobin 14.0 13.0 - 17.0 g/dL   HCT 16.1 09.6 - 04.5 %   MCV 100.5 (H) 80.0 - 100.0 fL   MCH 33.2 26.0 - 34.0 pg   MCHC 33.0 30.0 - 36.0 g/dL   RDW 40.9 81.1 - 91.4 %   Platelets 215 150 - 400 K/uL   nRBC 0.0 0.0 - 0.2 %   Neutrophils Relative % 92 %   Neutro Abs 17.8 (H) 1.7 - 7.7 K/uL   Lymphocytes Relative 4 %   Lymphs Abs 0.8 0.7 - 4.0 K/uL   Monocytes Relative 4 %   Monocytes Absolute 0.8 0.1 - 1.0 K/uL   Eosinophils Relative 0 %   Eosinophils Absolute 0.0 0.0 - 0.5 K/uL   Basophils Relative 0 %   Basophils Absolute 0.1 0.0 - 0.1 K/uL   Immature Granulocytes 0 %   Abs Immature Granulocytes 0.08 (H) 0.00 - 0.07 K/uL  Comprehensive metabolic panel     Status:  Abnormal   Collection Time: 05/31/23  7:36 PM  Result Value Ref Range   Sodium 137 135 - 145 mmol/L   Potassium 4.5 3.5 - 5.1 mmol/L   Chloride 110 98 - 111 mmol/L   CO2 19 (L) 22 - 32 mmol/L   Glucose, Bld 118 (H) 70 - 99 mg/dL   BUN 41 (H) 8 - 23 mg/dL   Creatinine, Ser 7.82 (H) 0.61 - 1.24 mg/dL   Calcium 95.6 (H) 8.9 - 10.3 mg/dL   Total Protein 7.5 6.5 - 8.1 g/dL   Albumin 3.5 3.5 - 5.0 g/dL   AST 31 15 - 41 U/L   ALT 22 0 - 44 U/L   Alkaline Phosphatase 86 38 - 126 U/L   Total Bilirubin 0.6 0.3 - 1.2 mg/dL   GFR, Estimated 41 (L) >60 mL/min   Anion gap 8 5 - 15  Troponin I (High Sensitivity)     Status: Abnormal   Collection Time: 05/31/23  7:36 PM  Result Value Ref Range   Troponin I (High Sensitivity) 79 (H) <18 ng/L  CK     Status: Abnormal   Collection Time: 05/31/23  7:36 PM  Result Value Ref Range   Total CK 45 (L) 49 - 397 U/L  Brain natriuretic peptide     Status: Abnormal   Collection Time: 05/31/23  7:36 PM  Result Value Ref Range   B Natriuretic Peptide 410.6 (H) 0.0 - 100.0 pg/mL  Lactic acid, plasma     Status: None   Collection Time: 05/31/23  8:24 PM  Result Value Ref Range   Lactic Acid, Venous 1.5 0.5 - 1.9 mmol/L     Key medications administered in the ER:  Medications  cefTRIAXone (ROCEPHIN) 1 g in sodium chloride 0.9 % 100 mL IVPB (1 g Intravenous New Bag/Given 05/31/23 2033)  lactated ringers bolus 500 mL (500 mLs Intravenous New Bag/Given 05/31/23 2032)   Medical decision making: -Vital signs stable. Patient afebrile, hemodynamically stable, and non-toxic appearing. -Patient's presentation is most consistent with acute presentation with potential threat to life or bodily function.. Mordecai Oo is a 82 y.o. male presenting to the emergency department with an unwitnessed fall.  -Additional history obtained from EMS.  I have attempted to contact the patient's son whose number is listed in the chart however upon multiple calls the patient's  family  member will not answer. -Patient presenting after numerous fall found down.  Will proceed with trauma workup.  His airway intact he has bilateral breath sounds and is hemodynamically stable.  Trauma workup is negative including CT head, CT C-spine, chest x-ray, pelvic x-ray.  Pelvic x-ray does show questionable left hip abnormality however patient does not have pain to palpation of her lower suspicion for acute fracture.  CBC is obtained and shows evidence of leukocytosis.  CMP is notable for baseline kidney function.  UA is consistent with UTI.  Vital signs show initial tachycardia in setting of leukocytosis concerns for possible sepsis.  Additionally, patient is awake and alert but oriented to person and place but not time.  Concerning for metabolic encephalopathy from UTI.  CK but was only mildly elevated at 45 do not suspect rhabdo.  EKG is without arrhythmia or ischemic changes however troponin obtained and is elevated to 79.  Patient denies chest pain or shortness of breath.  Due to UTI with elevated troponin I believe he is high risk and will need admission for further workup.  Will proceed with IV antibiotics, blood cultures, and admission.  I discussed the patient with medicine will admit him.  Patient admitted to the medicine service.   Medical Decision Making Amount and/or Complexity of Data Reviewed Labs: ordered. Decision-making details documented in ED Course. Radiology: ordered. Decision-making details documented in ED Course. ECG/medicine tests: ordered and independent interpretation performed. Decision-making details documented in ED Course.  Risk Decision regarding hospitalization.     The plan for this patient was discussed with Dr. Jodi Mourning, who voiced agreement and who oversaw evaluation and treatment of this patient.  Marta Lamas, MD Emergency Medicine, PGY-3  Note: Dragon medical dictation software was used in the creation of this note.   Clinical Impression:  1.  Syncope and collapse   2. Acute cystitis with hematuria          Chase Caller, MD 06/01/23 1610    Blane Ohara, MD 06/03/23 [phone]

## 2023-05-31 NOTE — H&P (Signed)
History and Physical      [name] [name] ZOX:[id number] DOB: 1941/11/24 DOA: 05/31/2023; DOS: 05/31/2023  PCP: Joycelyn Rua, MD *** Patient coming from: home ***  I have personally briefly reviewed patient's old medical records in [facility]  Chief Complaint: ***  HPI: [name] [name] is a 82 y.o. male with medical history significant for *** who is admitted to [facility] on 05/31/2023 with *** after presenting from home*** to [facility] ED complaining of ***.    ***       ***   ED Course:  Vital signs in the ED were notable for the following: ***  Labs were notable for the following: ***  Per my interpretation, EKG in ED demonstrated the following:  ***  Imaging and additional notable ED work-up: ***  While in the ED, the following were administered: ***  Subsequently, the patient was admitted  ***  ***red    Review of Systems: As per HPI otherwise 10 point review of systems negative.   Past Medical History:  Diagnosis Date   Atrial fibrillation (HCC) 03/21/2016   Chronic systolic heart failure [facility])     Past Surgical History:  Procedure Laterality Date   CARDIAC CATHETERIZATION N/A 03/04/2016   Procedure: Right/Left Heart Cath and Coronary Angiography;  Surgeon: Lennette Bihari, MD;  Location: [facility] INVASIVE CV LAB;  Service: Cardiovascular;  Laterality: N/A;   CARDIOVERSION N/A 04/16/2016   Procedure: CARDIOVERSION;  Surgeon: Jake Bathe, MD;  Location: [facility] ENDOSCOPY;  Service: Cardiovascular;  Laterality: N/A;   CLIPPING OF ATRIAL APPENDAGE Left 03/10/2016   Procedure: CLIPPING OF ATRIAL APPENDAGE;  Surgeon: Kerin Perna, MD;  Location: [facility] OR;  Service: Open Heart Surgery;  Laterality: Left;   CORONARY ARTERY BYPASS GRAFT N/A 03/10/2016   Procedure: CORONARY ARTERY BYPASS GRAFTING (CABG)TIMES 4 USING LEFT INTERNAL MAMMARY AND BILATERAL GREATER SAPHENOUS VEIN HARVESTED BY ENDOVIEN;  Surgeon: Kerin Perna, MD;  Location: [facility] OR;  Service: Open Heart  Surgery;  Laterality: N/A;   ENDARTERECTOMY Left 03/10/2016   Procedure: LEFT ENDARTERECTOMY CAROTID DACRON PATCH ANGIOPLASTY;  Surgeon: Sherren Kerns, MD;  Location: [facility] OR;  Service: Vascular;  Laterality: Left;   IR THORACENTESIS ASP PLEURAL SPACE W/IMG GUIDE  03/17/2022   RIGHT/LEFT HEART CATH AND CORONARY/GRAFT ANGIOGRAPHY N/A 03/24/2022   Procedure: RIGHT/LEFT HEART CATH AND CORONARY/GRAFT ANGIOGRAPHY;  Surgeon: Dolores Patty, MD;  Location: [facility] INVASIVE CV LAB;  Service: Cardiovascular;  Laterality: N/A;   TEE WITHOUT CARDIOVERSION N/A 03/10/2016   Procedure: TRANSESOPHAGEAL ECHOCARDIOGRAM (TEE);  Surgeon: Kerin Perna, MD;  Location: [facility] OR;  Service: Open Heart Surgery;  Laterality: N/A;   TEE WITHOUT CARDIOVERSION N/A 03/24/2022   Procedure: TRANSESOPHAGEAL ECHOCARDIOGRAM (TEE);  Surgeon: Thurmon Fair, MD;  Location: [facility] ENDOSCOPY;  Service: Cardiovascular;  Laterality: N/A;    Social History:  reports that he has quit smoking. He has quit using smokeless tobacco. He reports that he does not currently use drugs. He reports that he does not drink alcohol.   No Known Allergies  History reviewed. No pertinent family history.  Family history reviewed and not pertinent ***   Prior to Admission medications   Medication Sig Start Date End Date Taking? Authorizing Provider  aspirin 81 MG chewable tablet Chew 1 tablet (81 mg total) by mouth daily. 03/26/22   Alfredo Martinez, MD  atorvastatin (LIPITOR) 80 MG tablet TAKE 1 TABLET BY MOUTH DAILY AT 6 PM. 09/11/22   Lennette Bihari, MD  carvedilol (COREG) 25  MG tablet TAKE 1 TABLET BY MOUTH IN THE MORNING AND AT BEDTIME 03/08/23   Bensimhon, Bevelyn Buckles, MD  clopidogrel (PLAVIX) 75 MG tablet Take 1 tablet (75 mg total) by mouth daily. 04/27/22   Tonny Bollman, MD  dapagliflozin propanediol (FARXIGA) 10 MG TABS tablet Take 1 tablet (10 mg total) by mouth daily before breakfast. 04/01/22   Robbie Lis M, PA-C  furosemide (LASIX) 40 MG  tablet TAKE 1 AND 1/2 TABLETS BY MOUTH IN THE MORNING AND 1 TABLET (40 MG TOTAL) EVERY EVENING 12/28/22   Bensimhon, Bevelyn Buckles, MD  KLOR-CON M20 20 MEQ tablet TAKE 2 TABLETS BY MOUTH DAILY 12/28/22   Bensimhon, Bevelyn Buckles, MD  losartan (COZAAR) 25 MG tablet Take 0.5 tablets (12.5 mg total) by mouth at bedtime. 05/27/22   Jacklynn Ganong, FNP  spironolactone (ALDACTONE) 25 MG tablet Take 0.5 tablets (12.5 mg total) by mouth at bedtime. 04/29/22 04/24/23  Jacklynn Ganong, FNP     Objective    Physical Exam: Vitals:   05/31/23 1839 05/31/23 1845 05/31/23 1945 05/31/23 2300  BP:  103/76 106/88 136/85  Pulse:  98 95 83  Resp:  (!) 28 (!) 21 13  Temp: 98.2 F (36.8 C)   98.2 F (36.8 C)  TempSrc: Oral   Oral  SpO2:  98% 99% 97%    General: appears to be stated age; alert, oriented Skin: warm, dry, no rash Head:  AT/[place] Mouth:  Oral mucosa membranes appear moist, normal dentition Neck: supple; trachea midline Heart:  RRR; did not appreciate any M/R/G Lungs: CTAB, did not appreciate any wheezes, rales, or rhonchi Abdomen: + BS; soft, ND, NT Vascular: 2+ pedal pulses b/l; 2+ radial pulses b/l Extremities: no peripheral edema, no muscle wasting Neuro: strength and sensation intact in upper and lower extremities b/l ***   *** Neuro: 5/5 strength of the proximal and distal flexors and extensors of the upper and lower extremities bilaterally; sensation intact in upper and lower extremities b/l; cranial nerves II through XII grossly intact; no pronator drift; no evidence suggestive of slurred speech, dysarthria, or facial droop; Normal muscle tone. No tremors.  *** Neuro: In the setting of the patient's current mental status and associated inability to follow instructions, unable to perform full neurologic exam at this time.  As such, assessment of strength, sensation, and cranial nerves is limited at this time. Patient noted to spontaneously move all 4 extremities. No tremors.   ***    Labs on Admission: I have personally reviewed following labs and imaging studies  CBC: Recent Labs  Lab 05/31/23 1936  WBC 19.6*  NEUTROABS 17.8*  HGB 14.0  HCT 42.4  MCV 100.5*  PLT 215   Basic Metabolic Panel: Recent Labs  Lab 05/31/23 1936  NA 137  K 4.5  CL 110  CO2 19*  GLUCOSE 118*  BUN 41*  CREATININE 1.67*  CALCIUM 10.4*   GFR: CrCl cannot be calculated (Unknown ideal weight.). Liver Function Tests: Recent Labs  Lab 05/31/23 1936  AST 31  ALT 22  ALKPHOS 86  BILITOT 0.6  PROT 7.5  ALBUMIN 3.5   No results for input(s): "LIPASE", "AMYLASE" in the last 168 hours. No results for input(s): "AMMONIA" in the last 168 hours. Coagulation Profile: No results for input(s): "INR", "PROTIME" in the last 168 hours. Cardiac Enzymes: Recent Labs  Lab 05/31/23 1936  CKTOTAL 45*   BNP (last 3 results) No results for input(s): "PROBNP" in the last 8760 hours. HbA1C: No  results for input(s): "HGBA1C" in the last 72 hours. CBG: No results for input(s): "GLUCAP" in the last 168 hours. Lipid Profile: No results for input(s): "CHOL", "HDL", "LDLCALC", "TRIG", "CHOLHDL", "LDLDIRECT" in the last 72 hours. Thyroid Function Tests: No results for input(s): "TSH", "T4TOTAL", "FREET4", "T3FREE", "THYROIDAB" in the last 72 hours. Anemia Panel: No results for input(s): "VITAMINB12", "FOLATE", "FERRITIN", "TIBC", "IRON", "RETICCTPCT" in the last 72 hours. Urine analysis:    Component Value Date/Time   COLORURINE YELLOW 05/31/2023 1934   APPEARANCEUR HAZY (A) 05/31/2023 1934   LABSPEC 1.015 05/31/2023 1934   PHURINE 5.0 05/31/2023 1934   GLUCOSEU NEGATIVE 05/31/2023 1934   HGBUR MODERATE (A) 05/31/2023 1934   BILIRUBINUR NEGATIVE 05/31/2023 1934   KETONESUR NEGATIVE 05/31/2023 1934   PROTEINUR 30 (A) 05/31/2023 1934   NITRITE NEGATIVE 05/31/2023 1934   LEUKOCYTESUR MODERATE (A) 05/31/2023 1934    Radiological Exams on Admission: DG Pelvis  Portable  Result Date: 05/31/2023 CLINICAL DATA:  Syncope and fall. EXAM: PORTABLE PELVIS 1-2 VIEWS COMPARISON:  None Available. FINDINGS: Faint lucency through the neck of the left femur may be artifactual or represent a fracture. Clinical correlation is recommended. No other acute fracture or dislocation on this single provided image. The bones are osteopenic. Mild bilateral hip arthritic changes. Degenerative changes of the lower lumbar spine. Atherosclerotic calcification of the aorta and iliac arteries. The soft tissues are unremarkable IMPRESSION: Artifact versus possible left femoral neck fracture. Clinical correlation is recommended. Electronically Signed   By: Elgie Collard M.D.   On: 05/31/2023 19:42   CT Head Wo Contrast  Result Date: 05/31/2023 CLINICAL DATA:  Fall, loss of consciousness EXAM: CT HEAD WITHOUT CONTRAST CT CERVICAL SPINE WITHOUT CONTRAST TECHNIQUE: Multidetector CT imaging of the head and cervical spine was performed following the standard protocol without intravenous contrast. Multiplanar CT image reconstructions of the cervical spine were also generated. RADIATION DOSE REDUCTION: This exam was performed according to the departmental dose-optimization program which includes automated exposure control, adjustment of the mA and/or kV according to patient size and/or use of iterative reconstruction technique. COMPARISON:  None Available. FINDINGS: CT HEAD FINDINGS Brain: No evidence of acute infarction, hemorrhage, hydrocephalus, extra-axial collection or mass lesion/mass effect. Extensive periventricular and deep white matter hypodensity. Nonacute encephalomalacia of the right occipital lobe (series 3, image 12). Vascular: No hyperdense vessel or unexpected calcification. Skull: Normal. Negative for fracture or focal lesion. Sinuses/Orbits: No acute finding. Other: None. CT CERVICAL SPINE FINDINGS Alignment: Normal. Skull base and vertebrae: No acute fracture. No primary bone  lesion or focal pathologic process. Soft tissues and spinal canal: No prevertebral fluid or swelling. No visible canal hematoma. Disc levels: Severe multilevel disc degenerative disease and osteophytosis throughout the cervical spine, worst from C3-C7. Upper chest: Negative. Other: None. IMPRESSION: 1. No acute intracranial pathology. 2. Advanced small-vessel white matter disease and nonacute encephalomalacia of the right occipital lobe. 3. No fracture or static subluxation of the cervical spine. 4. Severe multilevel disc degenerative disease and osteophytosis throughout the cervical spine, worst from C3-C7. Electronically Signed   By: Jearld Lesch M.D.   On: 05/31/2023 19:41   CT Cervical Spine Wo Contrast  Result Date: 05/31/2023 CLINICAL DATA:  Fall, loss of consciousness EXAM: CT HEAD WITHOUT CONTRAST CT CERVICAL SPINE WITHOUT CONTRAST TECHNIQUE: Multidetector CT imaging of the head and cervical spine was performed following the standard protocol without intravenous contrast. Multiplanar CT image reconstructions of the cervical spine were also generated. RADIATION DOSE REDUCTION: This exam was performed according  to the departmental dose-optimization program which includes automated exposure control, adjustment of the mA and/or kV according to patient size and/or use of iterative reconstruction technique. COMPARISON:  None Available. FINDINGS: CT HEAD FINDINGS Brain: No evidence of acute infarction, hemorrhage, hydrocephalus, extra-axial collection or mass lesion/mass effect. Extensive periventricular and deep white matter hypodensity. Nonacute encephalomalacia of the right occipital lobe (series 3, image 12). Vascular: No hyperdense vessel or unexpected calcification. Skull: Normal. Negative for fracture or focal lesion. Sinuses/Orbits: No acute finding. Other: None. CT CERVICAL SPINE FINDINGS Alignment: Normal. Skull base and vertebrae: No acute fracture. No primary bone lesion or focal pathologic  process. Soft tissues and spinal canal: No prevertebral fluid or swelling. No visible canal hematoma. Disc levels: Severe multilevel disc degenerative disease and osteophytosis throughout the cervical spine, worst from C3-C7. Upper chest: Negative. Other: None. IMPRESSION: 1. No acute intracranial pathology. 2. Advanced small-vessel white matter disease and nonacute encephalomalacia of the right occipital lobe. 3. No fracture or static subluxation of the cervical spine. 4. Severe multilevel disc degenerative disease and osteophytosis throughout the cervical spine, worst from C3-C7. Electronically Signed   By: Jearld Lesch M.D.   On: 05/31/2023 19:41   DG Chest Portable 1 View  Result Date: 05/31/2023 CLINICAL DATA:  Syncope and fall. EXAM: PORTABLE CHEST 1 VIEW COMPARISON:  Chest radiograph dated 03/17/2022. FINDINGS: No focal consolidation, pleural effusion, or pneumothorax. Mild cardiomegaly. Median sternotomy wires and CABG vascular clips. Left atrial appendage occlusion device. Atherosclerotic calcification of the aorta. No acute osseous pathology. IMPRESSION: 1. No acute cardiopulmonary process. 2. Mild cardiomegaly. Electronically Signed   By: Elgie Collard M.D.   On: 05/31/2023 19:40      Assessment/Plan   Principal Problem:   Acute metabolic encephalopathy   ***       ***            ***                ***               ***               ***               ***                ***               ***               ***               ***               ***              ***          ***  DVT prophylaxis: SCD's ***  Code Status: Full code*** Family Communication: none*** Disposition Plan: Per Rounding Team Consults called: none***;  Admission status: ***     I SPENT GREATER THAN 75 *** MINUTES IN CLINICAL CARE  TIME/MEDICAL DECISION-MAKING IN COMPLETING THIS ADMISSION.      Chaney Born Zacharie Portner DO [facility]  From 7PM - 7AM   05/31/2023, 11:41 PM   ***

## 2023-05-31 NOTE — ED Triage Notes (Signed)
Pt BIB [facility] from home d/t an unwitnessed fall . Pt was fond in his back yard by his neighbor. Per pt, he was digging a hole in the back yard for about 30-65mins. Neighbor last seen him was around 12pm. Pt lives by himself, appears to be demented. VS stable.

## 2023-06-01 ENCOUNTER — Inpatient Hospital Stay (HOSPITAL_COMMUNITY): Payer: Medicare HMO

## 2023-06-01 DIAGNOSIS — I5022 Chronic systolic (congestive) heart failure: Secondary | ICD-10-CM | POA: Diagnosis present

## 2023-06-01 DIAGNOSIS — E785 Hyperlipidemia, unspecified: Secondary | ICD-10-CM | POA: Diagnosis present

## 2023-06-01 DIAGNOSIS — G9341 Metabolic encephalopathy: Secondary | ICD-10-CM | POA: Diagnosis not present

## 2023-06-01 DIAGNOSIS — N19 Unspecified kidney failure: Secondary | ICD-10-CM | POA: Diagnosis present

## 2023-06-01 DIAGNOSIS — N3000 Acute cystitis without hematuria: Secondary | ICD-10-CM | POA: Diagnosis present

## 2023-06-01 DIAGNOSIS — R6520 Severe sepsis without septic shock: Secondary | ICD-10-CM | POA: Diagnosis present

## 2023-06-01 DIAGNOSIS — R7989 Other specified abnormal findings of blood chemistry: Secondary | ICD-10-CM | POA: Diagnosis not present

## 2023-06-01 DIAGNOSIS — E860 Dehydration: Secondary | ICD-10-CM | POA: Diagnosis present

## 2023-06-01 DIAGNOSIS — N1832 Chronic kidney disease, stage 3b: Secondary | ICD-10-CM | POA: Diagnosis present

## 2023-06-01 LAB — BLOOD CULTURE ID PANEL (REFLEXED) - BCID2

## 2023-06-01 LAB — URINE CULTURE

## 2023-06-01 LAB — CBC WITH DIFFERENTIAL/PLATELET
Abs Immature Granulocytes: 0.06 10*3/uL (ref 0.00–0.07)
Basophils Absolute: 0.1 10*3/uL (ref 0.0–0.1)
Basophils Relative: 0 %
Eosinophils Absolute: 0 10*3/uL (ref 0.0–0.5)
Eosinophils Relative: 0 %
HCT: 40.7 % (ref 39.0–52.0)
Hemoglobin: 13.3 g/dL (ref 13.0–17.0)
Immature Granulocytes: 0 %
Lymphocytes Relative: 8 %
Lymphs Abs: 1.2 10*3/uL (ref 0.7–4.0)
MCH: 33.1 pg (ref 26.0–34.0)
MCHC: 32.7 g/dL (ref 30.0–36.0)
MCV: 101.2 fL — ABNORMAL HIGH (ref 80.0–100.0)
Monocytes Absolute: 1 10*3/uL (ref 0.1–1.0)
Monocytes Relative: 6 %
Neutro Abs: 13.1 10*3/uL — ABNORMAL HIGH (ref 1.7–7.7)
Neutrophils Relative %: 86 %
Platelets: 205 10*3/uL (ref 150–400)
RBC: 4.02 MIL/uL — ABNORMAL LOW (ref 4.22–5.81)
RDW: 13.9 % (ref 11.5–15.5)
WBC: 15.4 10*3/uL — ABNORMAL HIGH (ref 4.0–10.5)
nRBC: 0 % (ref 0.0–0.2)

## 2023-06-01 LAB — I-STAT VENOUS BLOOD GAS, ED
Acid-base deficit: 4 mmol/L — ABNORMAL HIGH (ref 0.0–2.0)
Bicarbonate: 21.1 mmol/L (ref 20.0–28.0)
Calcium, Ion: 1.35 mmol/L (ref 1.15–1.40)
HCT: 38 % — ABNORMAL LOW (ref 39.0–52.0)
Hemoglobin: 12.9 g/dL — ABNORMAL LOW (ref 13.0–17.0)
O2 Saturation: 65 %
Potassium: 4.3 mmol/L (ref 3.5–5.1)
Sodium: 141 mmol/L (ref 135–145)
TCO2: 22 mmol/L (ref 22–32)
pCO2, Ven: 36.2 mmHg — ABNORMAL LOW (ref 44–60)
pH, Ven: 7.374 (ref 7.25–7.43)
pO2, Ven: 34 mmHg (ref 32–45)

## 2023-06-01 LAB — ECHOCARDIOGRAM COMPLETE
AR max vel: 2.04 cm2
AV Peak grad: 6.7 mmHg
Ao pk vel: 1.29 m/s
Area-P 1/2: 3.4 cm2
Calc EF: 47.2 %
MV M vel: 5.23 m/s
MV Peak grad: 109.6 mmHg
P 1/2 time: 486 msec
Radius: 0.66 cm
S' Lateral: 3.8 cm
Single Plane A2C EF: 48 %
Single Plane A4C EF: 46.1 %

## 2023-06-01 LAB — PROCALCITONIN: Procalcitonin: <0.1 ng/mL

## 2023-06-01 LAB — MAGNESIUM
Magnesium: 2.3 mg/dL (ref 1.7–2.4)
Magnesium: 2.4 mg/dL (ref 1.7–2.4)

## 2023-06-01 LAB — COMPREHENSIVE METABOLIC PANEL
ALT: 41 U/L (ref 0–44)
AST: 53 U/L — ABNORMAL HIGH (ref 15–41)
Albumin: 3.2 g/dL — ABNORMAL LOW (ref 3.5–5.0)
Alkaline Phosphatase: 81 U/L (ref 38–126)
Anion gap: 8 (ref 5–15)
BUN: 39 mg/dL — ABNORMAL HIGH (ref 8–23)
CO2: 20 mmol/L — ABNORMAL LOW (ref 22–32)
Calcium: 10.2 mg/dL (ref 8.9–10.3)
Chloride: 109 mmol/L (ref 98–111)
Creatinine, Ser: 1.42 mg/dL — ABNORMAL HIGH (ref 0.61–1.24)
GFR, Estimated: 49 mL/min — ABNORMAL LOW (ref >60)
Glucose, Bld: 111 mg/dL — ABNORMAL HIGH (ref 70–99)
Potassium: 4.2 mmol/L (ref 3.5–5.1)
Sodium: 137 mmol/L (ref 135–145)
Total Bilirubin: 0.5 mg/dL (ref 0.3–1.2)
Total Protein: 6.9 g/dL (ref 6.5–8.1)

## 2023-06-01 LAB — TROPONIN I (HIGH SENSITIVITY)
Troponin I (High Sensitivity): 131 ng/L (ref <18)
Troponin I (High Sensitivity): 124 ng/L (ref <18)
Troponin I (High Sensitivity): 104 ng/L (ref <18)

## 2023-06-01 LAB — AMMONIA: Ammonia: 17 umol/L (ref 9–35)

## 2023-06-01 LAB — PHOSPHORUS: Phosphorus: 2.7 mg/dL (ref 2.5–4.6)

## 2023-06-01 LAB — VITAMIN B12: Vitamin B-12: 734 pg/mL (ref 180–914)

## 2023-06-01 LAB — CULTURE, BLOOD (ROUTINE X 2)

## 2023-06-01 LAB — TSH: TSH: 1.168 u[IU]/mL (ref 0.350–4.500)

## 2023-06-01 NOTE — Progress Notes (Signed)
PROGRESS NOTE    [name] [name]  UJW:[id number] DOB: 1941-01-14 DOA: 05/31/2023 PCP: Joycelyn Rua, MD  Chief Complaint  Patient presents with   Loss of Consciousness    Brief Narrative:   82 yo with atrial fibrillation, systolic heart failure, CAD s/p CABG in 2017, dyslipidemia, CKD IIIb and multiple other medical problems who presented with altered mental status.  Apparently patient was found in the backyard by his neighbor.  Was wandering around and noted to be confused relative to his baseline.  Neighbor called EMS who brought him for further evaluation.   He lives by himself at baseline.  He was admitted with altered mental status.  Discharge pending culture results and discussion with family to establish baseline.   Assessment & Plan:   Principal Problem:   Acute metabolic encephalopathy Active Problems:   Paroxysmal atrial fibrillation (HCC)   Severe sepsis (HCC)   Acute cystitis   Hypercalcemia   Elevated troponin   Dehydration   Acute prerenal azotemia   Chronic systolic CHF (congestive heart failure) (HCC)   Hyperlipidemia   CKD stage 3b, GFR 30-44 ml/min (HCC)  Acute Metabolic Encephalopathy Possibly related to UTI?  Seems improved today, awaiting final urine culture Continue abx for now Delirium precautions I called son x2 for collateral as history of presentation is Lamerle Jabs bit unclear.  I think hearing from son what happened will be important to plan Lindzie Boxx safe discharge.  Pyuria  Sepsis due to UTI  This could represent sepsis from Treyana Sturgell UTI, with WBC's, tachycardia, tachypnea Follow pending urine culture Blood cultures are notable for coag negative staph, suspect this is Yarielys Beed contaminant Ceftriaxone  Atrial Fibrillation with Slow Ventricular Response Asymptomatic - hold beta blocker  Consider cards c/s if symptomatic Not on anticoagulation, unclear reason  Systolic Heart Failure Noted, appears euvolemic Repeat echo pending On lasix at home - holding coreg due to  brady - lisinopril on hold   CKD IIIB Continue to monitor  Dyslipidemia Statin     DVT prophylaxis: scd Code Status: DNR Family Communication: none, called son x2 Disposition:   Status is: Inpatient Remains inpatient appropriate because: need for discharge plan   Consultants:  stable  Procedures:  Echo   Antimicrobials:  Anti-infectives (From admission, onward)    Start     Dose/Rate Route Frequency Ordered Stop   06/01/23 1800  cefTRIAXone (ROCEPHIN) 1 g in sodium chloride 0.9 % 100 mL IVPB        1 g 200 mL/hr over 30 Minutes Intravenous Every 24 hours 05/31/23 2307     05/31/23 2015  cefTRIAXone (ROCEPHIN) 1 g in sodium chloride 0.9 % 100 mL IVPB        1 g 200 mL/hr over 30 Minutes Intravenous  Once 05/31/23 2014 05/31/23 2323       Subjective: No complaints Asking about home  Objective: Vitals:   06/01/23 0505 06/01/23 0609 06/01/23 0755 06/01/23 1100  BP: (!) 166/69 121/78 (!) 162/61 (!) 128/48  Pulse: 75 69 64 66  Resp: 16 16 18 17   Temp: (!) 97.3 F (36.3 C) (!) 97.4 F (36.3 C) 97.6 F (36.4 C) (!) 97.4 F (36.3 C)  TempSrc: Oral Oral Oral Oral  SpO2: 94% 94% 96% 99%    Intake/Output Summary (Last 24 hours) at 06/01/2023 1348 Last data filed at 06/01/2023 1224 Gross per 24 hour  Intake 842.23 ml  Output 250 ml  Net 592.23 ml   There were no vitals filed for this visit.  Examination:  General exam: Appears calm and comfortable  Respiratory system: unlabored Cardiovascular system: RRR Gastrointestinal system: Abdomen is nondistended, soft and nontender. Central nervous system: CN 2-12 intact, 5/5 strength to upper and lowers, FNF intact Extremities: no LEE    Data Reviewed: I have personally reviewed following labs and imaging studies  CBC: Recent Labs  Lab 05/31/23 1936 06/01/23 0035 06/01/23 0057  WBC 19.6* 15.4*  --   NEUTROABS 17.8* 13.1*  --   HGB 14.0 13.3 12.9*  HCT 42.4 40.7 38.0*  MCV 100.5* 101.2*  --   PLT 215  205  --     Basic Metabolic Panel: Recent Labs  Lab 05/31/23 1936 06/01/23 0035 06/01/23 0057  NA 137 137 141  K 4.5 4.2 4.3  CL 110 109  --   CO2 19* 20*  --   GLUCOSE 118* 111*  --   BUN 41* 39*  --   CREATININE 1.67* 1.42*  --   CALCIUM 10.4* 10.2  --   MG 2.4 2.3  --   PHOS  --  2.7  --     GFR: CrCl cannot be calculated (Unknown ideal weight.).  Liver Function Tests: Recent Labs  Lab 05/31/23 1936 06/01/23 0035  AST 31 53*  ALT 22 41  ALKPHOS 86 81  BILITOT 0.6 0.5  PROT 7.5 6.9  ALBUMIN 3.5 3.2*    CBG: No results for input(s): "GLUCAP" in the last 168 hours.   Recent Results (from the past 240 hour(s))  Urine Culture     Status: None (Preliminary result)   Collection Time: 05/31/23  7:34 PM   Specimen: Urine, Clean Catch  Result Value Ref Range Status   Specimen Description URINE, CLEAN CATCH  Final   Special Requests   Final    NONE Reflexed from [id number] Performed at [facility], [address] [place], [place] [address]    Culture PENDING  Incomplete   Report Status PENDING  Incomplete  Blood culture (routine x 2)     Status: None (Preliminary result)   Collection Time: 05/31/23  8:15 PM   Specimen: BLOOD LEFT ARM  Result Value Ref Range Status   Specimen Description BLOOD LEFT ARM  Final   Special Requests   Final    BOTTLES DRAWN AEROBIC AND ANAEROBIC Blood Culture results may not be optimal due to an inadequate volume of blood received in culture bottles   Culture   Final    NO GROWTH < 12 HOURS Performed at [facility], [address] [place], [place] [address]    Report Status PENDING  Incomplete  Blood culture (routine x 2)     Status: None (Preliminary result)   Collection Time: 05/31/23  8:24 PM   Specimen: BLOOD RIGHT ARM  Result Value Ref Range Status   Specimen Description BLOOD RIGHT ARM  Final   Special Requests   Final    BOTTLES DRAWN AEROBIC AND ANAEROBIC Blood Culture results may not be optimal due to an  inadequate volume of blood received in culture bottles   Culture  Setup Time   Final    GRAM POSITIVE COCCI IN CLUSTERS ANAEROBIC BOTTLE ONLY Organism ID to follow Performed at [facility], [address], [place], [place] [address]    Culture [facility] POSITIVE COCCI  Final   Report Status PENDING  Incomplete         Radiology Studies: DG Pelvis Portable  Result Date: 05/31/2023 CLINICAL DATA:  Syncope  and fall. EXAM: PORTABLE PELVIS 1-2 VIEWS COMPARISON:  None Available. FINDINGS: Faint lucency through the neck of the left femur may be artifactual or represent Artice Bergerson fracture. Clinical correlation is recommended. No other acute fracture or dislocation on this single provided image. The bones are osteopenic. Mild bilateral hip arthritic changes. Degenerative changes of the lower lumbar spine. Atherosclerotic calcification of the aorta and iliac arteries. The soft tissues are unremarkable IMPRESSION: Artifact versus possible left femoral neck fracture. Clinical correlation is recommended. Electronically Signed   By: Elgie Collard M.D.   On: 05/31/2023 19:42   CT Head Wo Contrast  Result Date: 05/31/2023 CLINICAL DATA:  Fall, loss of consciousness EXAM: CT HEAD WITHOUT CONTRAST CT CERVICAL SPINE WITHOUT CONTRAST TECHNIQUE: Multidetector CT imaging of the head and cervical spine was performed following the standard protocol without intravenous contrast. Multiplanar CT image reconstructions of the cervical spine were also generated. RADIATION DOSE REDUCTION: This exam was performed according to the departmental dose-optimization program which includes automated exposure control, adjustment of the mA and/or kV according to patient size and/or use of iterative reconstruction technique. COMPARISON:  None Available. FINDINGS: CT HEAD FINDINGS Brain: No evidence of acute infarction, hemorrhage, hydrocephalus, extra-axial collection or mass lesion/mass effect. Extensive periventricular and deep white  matter hypodensity. Nonacute encephalomalacia of the right occipital lobe (series 3, image 12). Vascular: No hyperdense vessel or unexpected calcification. Skull: Normal. Negative for fracture or focal lesion. Sinuses/Orbits: No acute finding. Other: None. CT CERVICAL SPINE FINDINGS Alignment: Normal. Skull base and vertebrae: No acute fracture. No primary bone lesion or focal pathologic process. Soft tissues and spinal canal: No prevertebral fluid or swelling. No visible canal hematoma. Disc levels: Severe multilevel disc degenerative disease and osteophytosis throughout the cervical spine, worst from C3-C7. Upper chest: Negative. Other: None. IMPRESSION: 1. No acute intracranial pathology. 2. Advanced small-vessel white matter disease and nonacute encephalomalacia of the right occipital lobe. 3. No fracture or static subluxation of the cervical spine. 4. Severe multilevel disc degenerative disease and osteophytosis throughout the cervical spine, worst from C3-C7. Electronically Signed   By: Jearld Lesch M.D.   On: 05/31/2023 19:41   CT Cervical Spine Wo Contrast  Result Date: 05/31/2023 CLINICAL DATA:  Fall, loss of consciousness EXAM: CT HEAD WITHOUT CONTRAST CT CERVICAL SPINE WITHOUT CONTRAST TECHNIQUE: Multidetector CT imaging of the head and cervical spine was performed following the standard protocol without intravenous contrast. Multiplanar CT image reconstructions of the cervical spine were also generated. RADIATION DOSE REDUCTION: This exam was performed according to the departmental dose-optimization program which includes automated exposure control, adjustment of the mA and/or kV according to patient size and/or use of iterative reconstruction technique. COMPARISON:  None Available. FINDINGS: CT HEAD FINDINGS Brain: No evidence of acute infarction, hemorrhage, hydrocephalus, extra-axial collection or mass lesion/mass effect. Extensive periventricular and deep white matter hypodensity. Nonacute  encephalomalacia of the right occipital lobe (series 3, image 12). Vascular: No hyperdense vessel or unexpected calcification. Skull: Normal. Negative for fracture or focal lesion. Sinuses/Orbits: No acute finding. Other: None. CT CERVICAL SPINE FINDINGS Alignment: Normal. Skull base and vertebrae: No acute fracture. No primary bone lesion or focal pathologic process. Soft tissues and spinal canal: No prevertebral fluid or swelling. No visible canal hematoma. Disc levels: Severe multilevel disc degenerative disease and osteophytosis throughout the cervical spine, worst from C3-C7. Upper chest: Negative. Other: None. IMPRESSION: 1. No acute intracranial pathology. 2. Advanced small-vessel white matter disease and nonacute encephalomalacia of the right occipital lobe. 3. No fracture or  static subluxation of the cervical spine. 4. Severe multilevel disc degenerative disease and osteophytosis throughout the cervical spine, worst from C3-C7. Electronically Signed   By: Jearld Lesch M.D.   On: 05/31/2023 19:41   DG Chest Portable 1 View  Result Date: 05/31/2023 CLINICAL DATA:  Syncope and fall. EXAM: PORTABLE CHEST 1 VIEW COMPARISON:  Chest radiograph dated 03/17/2022. FINDINGS: No focal consolidation, pleural effusion, or pneumothorax. Mild cardiomegaly. Median sternotomy wires and CABG vascular clips. Left atrial appendage occlusion device. Atherosclerotic calcification of the aorta. No acute osseous pathology. IMPRESSION: 1. No acute cardiopulmonary process. 2. Mild cardiomegaly. Electronically Signed   By: Elgie Collard M.D.   On: 05/31/2023 19:40        Scheduled Meds:  aspirin  81 mg Oral Daily   atorvastatin  80 mg Oral Daily   carvedilol  25 mg Oral BID WC   Continuous Infusions:  cefTRIAXone (ROCEPHIN)  IV       LOS: 1 day    Time spent: over 30 min    Lacretia Nicks, MD [facility]   To contact the attending provider between 7A-7P or the covering provider during after  hours 7P-7A, please log into the web site [email or web address] and access using universal [place] password for that web site. If you do not have the password, please call the hospital operator.  06/01/2023, 1:48 PM

## 2023-06-01 NOTE — Evaluation (Signed)
Occupational Therapy Evaluation Patient Details Name: [name] [name] MRN: [id number] DOB: [birth year] Today's Date: 06/01/2023   History of Present Illness Pt is an 82 y.o. male who presented 05/31/23 with AMS with concern for sepsis due to cystitis. PMH: HFmrEF, CAD, s/p CABG x4, PAD, Afib, HLD, CKD III.   Clinical Impression   [name] [name] was evaluated s/p the above admission list. He lives alone and is indep at baseline, he drives, denies falls and uses a [facility] for community mobility. Upon evaluation the pt was limited by generalized weakness and mildly unsteady gait. Overall he demonstrated mobility and ADLs with [facility] and generalized supervision A for safety only. No overt LOB or safety concerns noted, pt like at his functional baseline. Pt does not need further acute or follow up OT. He is eager to d/c home.        Recommendations for follow up therapy are one component of a multi-disciplinary discharge planning process, led by the attending physician.  Recommendations may be updated based on patient status, additional functional criteria and insurance authorization.   Assistance Recommended at Discharge PRN  Patient can return home with the following A little help with walking and/or transfers;A little help with bathing/dressing/bathroom;Assistance with cooking/housework;Assist for transportation    Functional Status Assessment  Patient has had a recent decline in their functional status and demonstrates the ability to make significant improvements in function in a reasonable and predictable amount of time.  Equipment Recommendations  Other (comment);None recommended by OT       Precautions / Restrictions Precautions Precautions: Fall Precaution Comments: denies falls at home Restrictions Weight Bearing Restrictions: No      Mobility Bed Mobility Overal bed mobility: Modified Independent                  Transfers Overall transfer level: Modified independent Equipment used:  Straight cane                      Balance Overall balance assessment: Needs assistance Sitting-balance support: No upper extremity supported, Feet supported Sitting balance-Leahy Scale: Good     Standing balance support: Single extremity supported, During functional activity Standing balance-Leahy Scale: Fair                             ADL either performed or assessed with clinical judgement   ADL Overall ADL's : Needs assistance/impaired                                     Functional mobility during ADLs: Supervision/safety;Cane General ADL Comments: generalized supervision A for safety only. mobilized with SPC, no significant LOB. Pt likely at his functional baseline               Pertinent Vitals/Pain Pain Assessment Pain Assessment: No/denies pain     Hand Dominance Right   Extremity/Trunk Assessment Upper Extremity Assessment Upper Extremity Assessment: Overall WFL for tasks assessed   Lower Extremity Assessment Lower Extremity Assessment: Defer to PT evaluation   Cervical / Trunk Assessment Cervical / Trunk Assessment: Kyphotic   Communication Communication Communication: No difficulties   Cognition Arousal/Alertness: Awake/alert Behavior During Therapy: WFL for tasks assessed/performed Overall Cognitive Status: Within Functional Limits for tasks assessed  General Comments  VSS, pt asking to go home            Home Living Family/patient expects to be discharged to:: Private residence Living Arrangements: Alone Available Help at Discharge: Family;Available PRN/intermittently;Friend(s) Type of Home: House Home Access: Ramped entrance     Home Layout: One level     Bathroom Shower/Tub: Chief Strategy Officer: Handicapped height Bathroom Accessibility: Yes   Home Equipment: Pharmacist, hospital (2 wheels);Cane - single point    Additional Comments: reports neighbors check on him daily and grandchildren call to check on him as well      Prior Functioning/Environment Prior Level of Function : Independent/Modified Independent;Driving             Mobility Comments: uses SPC mainly, does a lot of furniture walking; still driving some          OT Problem List: Decreased activity tolerance         OT Goals(Current goals can be found in the care plan section) Acute Rehab OT Goals Patient Stated Goal: to go home OT Goal Formulation: With patient Time For Goal Achievement: 06/15/23 Potential to Achieve Goals: Good   AM-PAC OT "6 Clicks" Daily Activity     Outcome Measure Help from another person eating meals?: None Help from another person taking care of personal grooming?: A Little Help from another person toileting, which includes using toliet, bedpan, or urinal?: A Little Help from another person bathing (including washing, rinsing, drying)?: A Little Help from another person to put on and taking off regular upper body clothing?: None Help from another person to put on and taking off regular lower body clothing?: A Little 6 Click Score: 20   End of Session Equipment Utilized During Treatment: Gait belt [facility]) Nurse Communication: Mobility status  Activity Tolerance: Patient tolerated treatment well Patient left: in bed;with call bell/phone within reach;with bed alarm set  OT Visit Diagnosis: Unsteadiness on feet (R26.81)                Time: 1610-9604 OT Time Calculation (min): 15 min Charges:  OT General Charges $OT Visit: 1 Visit OT Evaluation $OT Eval Low Complexity: 1 Low  Derenda Mis, OTR/L Acute Rehabilitation Services Office [phone] Secure Chat Communication Preferred   Donia Pounds 06/01/2023, 4:50 PM

## 2023-06-01 NOTE — Progress Notes (Signed)
Echocardiogram 2D Echocardiogram has been performed.  [name] [name] 06/01/2023, 4:29 PM

## 2023-06-01 NOTE — ED Notes (Signed)
Pt stating he wants to leave.  This RN explained reasoning for hospital stay and that since he had no way to get home, we would get him upstairs and let staff take care of him until he is allowed to leave the hospital.  Pt asks if the ambulance can come back to get him or if the fire department could pick him up.  This RN explained that these groups could not pick him up to take him home.  Pt states he understands he should stay in the hospital.  This RN offered to make pt more comfortable; blankets provided for same.  Pt able to answer all orientation questions.

## 2023-06-01 NOTE — Significant Event (Signed)
Lab notified troponin 131. Simultaneously tele notified 5 beat run of PVC's . Both values communicated with the Provider. Provider ordered EKG and repeat STAT troponins. Set of vitals taken EKG done and transmitted and notified provider. Patient complained of no pain . Will continue to monitor patient and update the day shift RN

## 2023-06-01 NOTE — Progress Notes (Signed)
PHARMACY - PHYSICIAN COMMUNICATION CRITICAL VALUE ALERT - BLOOD CULTURE IDENTIFICATION (BCID)    Assessment:  [name] [name] is an 82 y.o. male who presented to [facility] on 05/31/2023 with a chief complaint of AMS.   6/17 Bcx: 2/4 bottles (same set) GPCs in clusters>>identified as Staphylococcus spp. On BCID without speciation - likely contaminant   Name of physician (or Provider) Contacted: Dr. Lowell Guitar   Current antibiotics: Ceftriaxone 1g IV Q24H   Changes to prescribed antibiotics recommended:  Patient is on recommended antibiotics - No changes needed- likely contaminant   Results for orders placed or performed during the hospital encounter of 05/31/23  Blood Culture ID Panel (Reflexed) (Collected: 05/31/2023  8:24 PM)  Result Value Ref Range   Enterococcus faecalis NOT DETECTED NOT DETECTED   Enterococcus Faecium NOT DETECTED NOT DETECTED   Listeria monocytogenes NOT DETECTED NOT DETECTED   Staphylococcus species DETECTED (A) NOT DETECTED   Staphylococcus aureus (BCID) NOT DETECTED NOT DETECTED   Staphylococcus epidermidis DETECTED (A) NOT DETECTED   Staphylococcus lugdunensis NOT DETECTED NOT DETECTED   Streptococcus species NOT DETECTED NOT DETECTED   Streptococcus agalactiae NOT DETECTED NOT DETECTED   Streptococcus pneumoniae NOT DETECTED NOT DETECTED   Streptococcus pyogenes NOT DETECTED NOT DETECTED   A.calcoaceticus-baumannii NOT DETECTED NOT DETECTED   Bacteroides fragilis NOT DETECTED NOT DETECTED   Enterobacterales NOT DETECTED NOT DETECTED   Enterobacter cloacae complex NOT DETECTED NOT DETECTED   Escherichia coli NOT DETECTED NOT DETECTED   Klebsiella aerogenes NOT DETECTED NOT DETECTED   Klebsiella oxytoca NOT DETECTED NOT DETECTED   Klebsiella pneumoniae NOT DETECTED NOT DETECTED   Proteus species NOT DETECTED NOT DETECTED   Salmonella species NOT DETECTED NOT DETECTED   Serratia marcescens NOT DETECTED NOT DETECTED   Haemophilus influenzae NOT DETECTED NOT  DETECTED   Neisseria meningitidis NOT DETECTED NOT DETECTED   Pseudomonas aeruginosa NOT DETECTED NOT DETECTED   Stenotrophomonas maltophilia NOT DETECTED NOT DETECTED   Candida albicans NOT DETECTED NOT DETECTED   Candida auris NOT DETECTED NOT DETECTED   Candida glabrata NOT DETECTED NOT DETECTED   Candida krusei NOT DETECTED NOT DETECTED   Candida parapsilosis NOT DETECTED NOT DETECTED   Candida tropicalis NOT DETECTED NOT DETECTED   Cryptococcus neoformans/gattii NOT DETECTED NOT DETECTED   Methicillin resistance mecA/C NOT DETECTED NOT DETECTED    Jani Gravel, PharmD PGY-2 Infectious Diseases Resident  06/01/2023 2:51 PM

## 2023-06-01 NOTE — ED Notes (Signed)
Pt transferred to inpatient unit via stretcher with all belongings. 

## 2023-06-01 NOTE — Progress Notes (Signed)
OT Cancellation Note  Patient Details Name: [name] [name] MRN: [id number] DOB: 1941/07/07   Cancelled Treatment:    Reason Eval/Treat Not Completed: Patient at procedure or test/ unavailable (echo, OT eval to f/u when pt is available)  Donia Pounds 06/01/2023, 3:37 PM

## 2023-06-01 NOTE — Evaluation (Signed)
Physical Therapy Evaluation Patient Details Name: [name] [name] MRN: [id number] DOB: 04/30/1941 Today's Date: 06/01/2023  History of Present Illness  Pt is an 82 y.o. male who presented 05/31/23 with AMS with concern for sepsis due to cystitis. PMH: HFmrEF, CAD, s/p CABG x4, PAD, Afib, HLD, CKD III.  Clinical Impression  Patient presents with mobility likely close to his baseline.  He likely furniture walks in the home and reports using a cane more than a walker when out.  Today able to mobilize in the room unaided but using furniture/surfaces for support; in hallway LOB with min A needed when not holding on to walls/etc.  Patient with HHA rest of distance without LOB.  Feel he will benefit from skilled PT in the acute setting to maximize balance and safety and from initial HHPT at d/c for safety and balance training.         Recommendations for follow up therapy are one component of a multi-disciplinary discharge planning process, led by the attending physician.  Recommendations may be updated based on patient status, additional functional criteria and insurance authorization.  Follow Up Recommendations       Assistance Recommended at Discharge Intermittent Supervision/Assistance  Patient can return home with the following  Help with stairs or ramp for entrance;Assistance with cooking/housework    Equipment Recommendations None recommended by PT  Recommendations for Other Services       Functional Status Assessment Patient has had a recent decline in their functional status and demonstrates the ability to make significant improvements in function in a reasonable and predictable amount of time.     Precautions / Restrictions Precautions Precautions: Fall Precaution Comments: denies falls at home      Mobility  Bed Mobility Overal bed mobility: Modified Independent                  Transfers Overall transfer level: Modified independent                       Ambulation/Gait Ambulation/Gait assistance: Supervision, Min assist Gait Distance (Feet): 150 Feet Assistive device: None, 1 person hand held assist Gait Pattern/deviations: Step-through pattern, Decreased stride length, Shuffle, Staggering right       General Gait Details: in room holding counter, table and bed and moving with S only, in hallway initially with S until R lateral LOB pt attempting self recovery but min A for balance given then walked with HHA on R with no further LOB  Stairs            Wheelchair Mobility    Modified Rankin (Stroke Patients Only)       Balance Overall balance assessment: Needs assistance   Sitting balance-Leahy Scale: Good       Standing balance-Leahy Scale: Good Standing balance comment: static balance at sink to brush teeth, move from bed to sink then back to bed unaided occasional hand support on sink                             Pertinent Vitals/Pain Pain Assessment Pain Assessment: No/denies pain    Home Living Family/patient expects to be discharged to:: Private residence Living Arrangements: Alone Available Help at Discharge: Family;Available PRN/intermittently;Friend(s) Type of Home: House Home Access: Ramped entrance       Home Layout: One level Home Equipment: Pharmacist, hospital (2 wheels);Cane - single point Additional Comments: reports neighbors check on him daily and grandchildren call  to check on him as well    Prior Function Prior Level of Function : Independent/Modified Independent;Driving             Mobility Comments: uses SPC mainly, does a lot of furniture walking; still driving some       Hand Dominance   Dominant Hand: Right    Extremity/Trunk Assessment   Upper Extremity Assessment Upper Extremity Assessment: Overall WFL for tasks assessed    Lower Extremity Assessment Lower Extremity Assessment: Overall WFL for tasks assessed    Cervical / Trunk  Assessment Cervical / Trunk Assessment: Kyphotic  Communication   Communication: No difficulties  Cognition Arousal/Alertness: Awake/alert Behavior During Therapy: WFL for tasks assessed/performed Overall Cognitive Status: Within Functional Limits for tasks assessed                                          General Comments General comments (skin integrity, edema, etc.): noted R upper arm with edema around the antecubital IV then L arm with IV in wrist oozing blood, RN made aware    Exercises     Assessment/Plan    PT Assessment Patient needs continued PT services  PT Problem List Decreased balance;Decreased mobility;Decreased safety awareness;Decreased knowledge of use of DME       PT Treatment Interventions DME instruction;Balance training;Gait training;Functional mobility training;Patient/family education;Therapeutic activities;Neuromuscular re-education    PT Goals (Current goals can be found in the Care Plan section)  Acute Rehab PT Goals Patient Stated Goal: return home today PT Goal Formulation: With patient Time For Goal Achievement: 06/15/23 Potential to Achieve Goals: Good    Frequency Min 3X/week     Co-evaluation               AM-PAC PT "6 Clicks" Mobility  Outcome Measure Help needed turning from your back to your side while in a flat bed without using bedrails?: None Help needed moving from lying on your back to sitting on the side of a flat bed without using bedrails?: None Help needed moving to and from a bed to a chair (including a wheelchair)?: None Help needed standing up from a chair using your arms (e.g., wheelchair or bedside chair)?: None Help needed to walk in hospital room?: A Little Help needed climbing 3-5 steps with a railing? : Total 6 Click Score: 20    End of Session   Activity Tolerance: Patient tolerated treatment well Patient left: in bed;with call bell/phone within reach   PT Visit Diagnosis: Other  abnormalities of gait and mobility (R26.89)    Time: 6213-0865 PT Time Calculation (min) (ACUTE ONLY): 24 min   Charges:   PT Evaluation $PT Eval Low Complexity: 1 Low PT Treatments $Gait Training: 8-22 mins        Sheran Lawless, PT Acute Rehabilitation Services [phone] 06/01/2023   [name] [name] 06/01/2023, 3:09 PM

## 2023-06-01 NOTE — ED Notes (Signed)
ED TO INPATIENT HANDOFF REPORT  ED Nurse Name and Phone #: Joselyn Glassman [phone])  S Name/Age/Gender [name] [name] 82 y.o. male Room/Bed: 019C/019C  Code Status   Code Status: DNR  Home/SNF/Other Home Patient oriented to: self, place, time, and situation Is this baseline? Yes   Triage Complete: Triage complete  Chief Complaint Acute metabolic encephalopathy [G93.41]  Triage Note Pt BIB [facility] from home d/t an unwitnessed fall . Pt was fond in his back yard by his neighbor. Per pt, he was digging a hole in the back yard for about 30-1mins. Neighbor last seen him was around 12pm. Pt lives by himself, appears to be demented. VS stable.    Allergies No Known Allergies  Level of Care/Admitting Diagnosis ED Disposition     ED Disposition  Admit   Condition  --   Comment  Hospital Area: [facility] [[id number]]  Level of Care: Telemetry Medical [104]  May admit patient to [facility] or [facility] if equivalent level of care is available:: No  Covid Evaluation: Asymptomatic - no recent exposure (last 10 days) testing not required  Diagnosis: Acute metabolic encephalopathy [4540981]  Admitting Physician: Angie Fava [[id number]]  Attending Physician: Angie Fava [[id number]]  Certification:: I certify this patient will need inpatient services for at least 2 midnights  Estimated Length of Stay: 2          B Medical/Surgery History Past Medical History:  Diagnosis Date   Atrial fibrillation (HCC) 03/21/2016   Chronic systolic heart failure (HCC)    Past Surgical History:  Procedure Laterality Date   CARDIAC CATHETERIZATION N/A 03/04/2016   Procedure: Right/Left Heart Cath and Coronary Angiography;  Surgeon: Lennette Bihari, MD;  Location: [facility] INVASIVE CV LAB;  Service: Cardiovascular;  Laterality: N/A;   CARDIOVERSION N/A 04/16/2016   Procedure: CARDIOVERSION;  Surgeon: Jake Bathe, MD;  Location: [facility] ENDOSCOPY;  Service: Cardiovascular;  Laterality: N/A;    CLIPPING OF ATRIAL APPENDAGE Left 03/10/2016   Procedure: CLIPPING OF ATRIAL APPENDAGE;  Surgeon: Kerin Perna, MD;  Location: [facility] OR;  Service: Open Heart Surgery;  Laterality: Left;   CORONARY ARTERY BYPASS GRAFT N/A 03/10/2016   Procedure: CORONARY ARTERY BYPASS GRAFTING (CABG)TIMES 4 USING LEFT INTERNAL MAMMARY AND BILATERAL GREATER SAPHENOUS VEIN HARVESTED BY ENDOVIEN;  Surgeon: Kerin Perna, MD;  Location: [facility] OR;  Service: Open Heart Surgery;  Laterality: N/A;   ENDARTERECTOMY Left 03/10/2016   Procedure: LEFT ENDARTERECTOMY CAROTID DACRON PATCH ANGIOPLASTY;  Surgeon: Sherren Kerns, MD;  Location: [facility] OR;  Service: Vascular;  Laterality: Left;   IR THORACENTESIS ASP PLEURAL SPACE W/IMG GUIDE  03/17/2022   RIGHT/LEFT HEART CATH AND CORONARY/GRAFT ANGIOGRAPHY N/A 03/24/2022   Procedure: RIGHT/LEFT HEART CATH AND CORONARY/GRAFT ANGIOGRAPHY;  Surgeon: Dolores Patty, MD;  Location: [facility] INVASIVE CV LAB;  Service: Cardiovascular;  Laterality: N/A;   TEE WITHOUT CARDIOVERSION N/A 03/10/2016   Procedure: TRANSESOPHAGEAL ECHOCARDIOGRAM (TEE);  Surgeon: Kerin Perna, MD;  Location: [facility] OR;  Service: Open Heart Surgery;  Laterality: N/A;   TEE WITHOUT CARDIOVERSION N/A 03/24/2022   Procedure: TRANSESOPHAGEAL ECHOCARDIOGRAM (TEE);  Surgeon: Thurmon Fair, MD;  Location: [facility] ENDOSCOPY;  Service: Cardiovascular;  Laterality: N/A;     A IV Location/Drains/Wounds Patient Lines/Drains/Airways Status     Active Line/Drains/Airways     Name Placement date Placement time Site Days   Peripheral IV 05/31/23 18 G Anterior;Left;Proximal Forearm 05/31/23  1835  Forearm  1   Wound / Incision (Open or Dehisced)  03/17/22 Non-pressure wound Pretibial Distal;Right;Lower 03/17/22  2037  Pretibial  441            Intake/Output Last 24 hours  Intake/Output Summary (Last 24 hours) at 06/01/2023 0032 Last data filed at 06/01/2023 0006 Gross per 24 hour  Intake 602.23 ml  Output --  Net 602.23 ml     Labs/Imaging Results for orders placed or performed during the hospital encounter of 05/31/23 (from the past 48 hour(s))  Urinalysis, w/ Reflex to Culture (Infection Suspected) -Urine, Clean Catch     Status: Abnormal   Collection Time: 05/31/23  7:34 PM  Result Value Ref Range   Specimen Source URINE, CLEAN CATCH    Color, Urine YELLOW YELLOW   APPearance HAZY (A) CLEAR   Specific Gravity, Urine 1.015 1.005 - 1.030   pH 5.0 5.0 - 8.0   Glucose, UA NEGATIVE NEGATIVE mg/dL   Hgb urine dipstick MODERATE (A) NEGATIVE   Bilirubin Urine NEGATIVE NEGATIVE   Ketones, ur NEGATIVE NEGATIVE mg/dL   Protein, ur 30 (A) NEGATIVE mg/dL   Nitrite NEGATIVE NEGATIVE   Leukocytes,Ua MODERATE (A) NEGATIVE   RBC / HPF 0-5 0 - 5 RBC/hpf   WBC, UA 21-50 0 - 5 WBC/hpf    Comment:        Reflex urine culture not performed if WBC <=10, OR if Squamous epithelial cells >5. If Squamous epithelial cells >5 suggest recollection.    Bacteria, UA MANY (A) NONE SEEN   Squamous Epithelial / HPF 0-5 0 - 5 /HPF   Mucus PRESENT     Comment: Performed at [facility] Lab, [address] [place], [place] [address]  Urine Culture     Status: None (Preliminary result)   Collection Time: 05/31/23  7:34 PM   Specimen: Urine, Clean Catch  Result Value Ref Range   Specimen Description URINE, CLEAN CATCH    Special Requests      NONE Reflexed from [id number] Performed at [facility] Lab, [address], [place], [place] [address]    Culture PENDING    Report Status PENDING   CBC with Differential/Platelet     Status: Abnormal   Collection Time: 05/31/23  7:36 PM  Result Value Ref Range   WBC 19.6 (H) 4.0 - 10.5 K/uL   RBC 4.22 4.22 - 5.81 MIL/uL   Hemoglobin 14.0 13.0 - 17.0 g/dL   HCT 40.1 02.7 - 25.3 %   MCV 100.5 (H) 80.0 - 100.0 fL   MCH 33.2 26.0 - 34.0 pg   MCHC 33.0 30.0 - 36.0 g/dL   RDW 66.4 40.3 - 47.4 %   Platelets 215 150 - 400 K/uL   nRBC 0.0 0.0 - 0.2 %   Neutrophils Relative % 92 %    Neutro Abs 17.8 (H) 1.7 - 7.7 K/uL   Lymphocytes Relative 4 %   Lymphs Abs 0.8 0.7 - 4.0 K/uL   Monocytes Relative 4 %   Monocytes Absolute 0.8 0.1 - 1.0 K/uL   Eosinophils Relative 0 %   Eosinophils Absolute 0.0 0.0 - 0.5 K/uL   Basophils Relative 0 %   Basophils Absolute 0.1 0.0 - 0.1 K/uL   Immature Granulocytes 0 %   Abs Immature Granulocytes 0.08 (H) 0.00 - 0.07 K/uL    Comment: Performed at [facility] Lab, [address], [place], [place] [address]  Comprehensive metabolic panel     Status: Abnormal   Collection Time: 05/31/23  7:36 PM  Result Value Ref  Range   Sodium 137 135 - 145 mmol/L   Potassium 4.5 3.5 - 5.1 mmol/L   Chloride 110 98 - 111 mmol/L   CO2 19 (L) 22 - 32 mmol/L   Glucose, Bld 118 (H) 70 - 99 mg/dL    Comment: Glucose reference range applies only to samples taken after fasting for at least 8 hours.   BUN 41 (H) 8 - 23 mg/dL   Creatinine, Ser 2.13 (H) 0.61 - 1.24 mg/dL   Calcium 08.6 (H) 8.9 - 10.3 mg/dL   Total Protein 7.5 6.5 - 8.1 g/dL   Albumin 3.5 3.5 - 5.0 g/dL   AST 31 15 - 41 U/L   ALT 22 0 - 44 U/L   Alkaline Phosphatase 86 38 - 126 U/L   Total Bilirubin 0.6 0.3 - 1.2 mg/dL   GFR, Estimated 41 (L) >60 mL/min    Comment: (NOTE) Calculated using the CKD-EPI Creatinine Equation (2021)    Anion gap 8 5 - 15    Comment: Performed at [facility] Lab, [address], [place], [place] [address]  Troponin I (High Sensitivity)     Status: Abnormal   Collection Time: 05/31/23  7:36 PM  Result Value Ref Range   Troponin I (High Sensitivity) 79 (H) <18 ng/L    Comment: (NOTE) Elevated high sensitivity troponin I (hsTnI) values and significant  changes across serial measurements may suggest ACS but many other  chronic and acute conditions are known to elevate hsTnI results.  Refer to the "Links" section for chest pain algorithms and additional  guidance. Performed at [facility], [address], [place], [place] [address]   CK      Status: Abnormal   Collection Time: 05/31/23  7:36 PM  Result Value Ref Range   Total CK 45 (L) 49 - 397 U/L    Comment: Performed at [facility], [address] [place], [place] [address]  Brain natriuretic peptide     Status: Abnormal   Collection Time: 05/31/23  7:36 PM  Result Value Ref Range   B Natriuretic Peptide 410.6 (H) 0.0 - 100.0 pg/mL    Comment: Performed at [facility], [address] [place], [place] [address]  Magnesium     Status: None   Collection Time: 05/31/23  7:36 PM  Result Value Ref Range   Magnesium 2.4 1.7 - 2.4 mg/dL    Comment: Performed at [facility] Lab, [address] [place], [place] [address]  TSH     Status: None   Collection Time: 05/31/23  7:36 PM  Result Value Ref Range   TSH 1.168 0.350 - 4.500 uIU/mL    Comment: Performed by a 3rd Generation assay with a functional sensitivity of <=0.01 uIU/mL. Performed at [facility], [address], [place], [place] [address]   Lactic acid, plasma     Status: None   Collection Time: 05/31/23  8:24 PM  Result Value Ref Range   Lactic Acid, Venous 1.5 0.5 - 1.9 mmol/L    Comment: Performed at [facility], [address] [place], [place] [address]   DG Pelvis Portable  Result Date: 05/31/2023 CLINICAL DATA:  Syncope and fall. EXAM: PORTABLE PELVIS 1-2 VIEWS COMPARISON:  None Available. FINDINGS: Faint lucency through the neck of the left femur may be artifactual or represent a fracture. Clinical correlation is recommended. No other acute fracture or dislocation on this single provided image. The bones  are osteopenic. Mild bilateral hip arthritic changes. Degenerative changes of the lower lumbar spine. Atherosclerotic calcification of the aorta and iliac arteries. The soft tissues are unremarkable IMPRESSION: Artifact versus possible left femoral neck fracture. Clinical correlation is recommended. Electronically Signed   By: Elgie Collard M.D.   On: 05/31/2023 19:42   CT  Head Wo Contrast  Result Date: 05/31/2023 CLINICAL DATA:  Fall, loss of consciousness EXAM: CT HEAD WITHOUT CONTRAST CT CERVICAL SPINE WITHOUT CONTRAST TECHNIQUE: Multidetector CT imaging of the head and cervical spine was performed following the standard protocol without intravenous contrast. Multiplanar CT image reconstructions of the cervical spine were also generated. RADIATION DOSE REDUCTION: This exam was performed according to the departmental dose-optimization program which includes automated exposure control, adjustment of the mA and/or kV according to patient size and/or use of iterative reconstruction technique. COMPARISON:  None Available. FINDINGS: CT HEAD FINDINGS Brain: No evidence of acute infarction, hemorrhage, hydrocephalus, extra-axial collection or mass lesion/mass effect. Extensive periventricular and deep white matter hypodensity. Nonacute encephalomalacia of the right occipital lobe (series 3, image 12). Vascular: No hyperdense vessel or unexpected calcification. Skull: Normal. Negative for fracture or focal lesion. Sinuses/Orbits: No acute finding. Other: None. CT CERVICAL SPINE FINDINGS Alignment: Normal. Skull base and vertebrae: No acute fracture. No primary bone lesion or focal pathologic process. Soft tissues and spinal canal: No prevertebral fluid or swelling. No visible canal hematoma. Disc levels: Severe multilevel disc degenerative disease and osteophytosis throughout the cervical spine, worst from C3-C7. Upper chest: Negative. Other: None. IMPRESSION: 1. No acute intracranial pathology. 2. Advanced small-vessel white matter disease and nonacute encephalomalacia of the right occipital lobe. 3. No fracture or static subluxation of the cervical spine. 4. Severe multilevel disc degenerative disease and osteophytosis throughout the cervical spine, worst from C3-C7. Electronically Signed   By: Jearld Lesch M.D.   On: 05/31/2023 19:41   CT Cervical Spine Wo Contrast  Result Date:  05/31/2023 CLINICAL DATA:  Fall, loss of consciousness EXAM: CT HEAD WITHOUT CONTRAST CT CERVICAL SPINE WITHOUT CONTRAST TECHNIQUE: Multidetector CT imaging of the head and cervical spine was performed following the standard protocol without intravenous contrast. Multiplanar CT image reconstructions of the cervical spine were also generated. RADIATION DOSE REDUCTION: This exam was performed according to the departmental dose-optimization program which includes automated exposure control, adjustment of the mA and/or kV according to patient size and/or use of iterative reconstruction technique. COMPARISON:  None Available. FINDINGS: CT HEAD FINDINGS Brain: No evidence of acute infarction, hemorrhage, hydrocephalus, extra-axial collection or mass lesion/mass effect. Extensive periventricular and deep white matter hypodensity. Nonacute encephalomalacia of the right occipital lobe (series 3, image 12). Vascular: No hyperdense vessel or unexpected calcification. Skull: Normal. Negative for fracture or focal lesion. Sinuses/Orbits: No acute finding. Other: None. CT CERVICAL SPINE FINDINGS Alignment: Normal. Skull base and vertebrae: No acute fracture. No primary bone lesion or focal pathologic process. Soft tissues and spinal canal: No prevertebral fluid or swelling. No visible canal hematoma. Disc levels: Severe multilevel disc degenerative disease and osteophytosis throughout the cervical spine, worst from C3-C7. Upper chest: Negative. Other: None. IMPRESSION: 1. No acute intracranial pathology. 2. Advanced small-vessel white matter disease and nonacute encephalomalacia of the right occipital lobe. 3. No fracture or static subluxation of the cervical spine. 4. Severe multilevel disc degenerative disease and osteophytosis throughout the cervical spine, worst from C3-C7. Electronically Signed   By: Jearld Lesch M.D.   On: 05/31/2023 19:41   DG Chest Portable 1 View  Result Date:  05/31/2023 CLINICAL DATA:  Syncope and  fall. EXAM: PORTABLE CHEST 1 VIEW COMPARISON:  Chest radiograph dated 03/17/2022. FINDINGS: No focal consolidation, pleural effusion, or pneumothorax. Mild cardiomegaly. Median sternotomy wires and CABG vascular clips. Left atrial appendage occlusion device. Atherosclerotic calcification of the aorta. No acute osseous pathology. IMPRESSION: 1. No acute cardiopulmonary process. 2. Mild cardiomegaly. Electronically Signed   By: Elgie Collard M.D.   On: 05/31/2023 19:40    Pending Labs Unresulted Labs (From admission, onward)     Start     Ordered   06/01/23 0500  CBC with Differential/Platelet  Tomorrow morning,   R        05/31/23 2306   06/01/23 0500  Comprehensive metabolic panel  Tomorrow morning,   R        05/31/23 2306   06/01/23 0500  Magnesium  Tomorrow morning,   R        05/31/23 2306   06/01/23 0500  Ammonia  Tomorrow morning,   R        05/31/23 2333   06/01/23 0500  Blood gas, venous  Tomorrow morning,   R        05/31/23 2333   06/01/23 0500  Phosphorus  Tomorrow morning,   R        05/31/23 2336   06/01/23 0030  Vitamin B12  Once,   R        06/01/23 0030   05/31/23 2334  Rapid urine drug screen (hospital performed)  Add-on,   AD        05/31/23 2333   05/31/23 2334  Procalcitonin  Add-on,   AD       References:    Procalcitonin Lower Respiratory Tract Infection AND Sepsis Procalcitonin Algorithm   05/31/23 2333   05/31/23 2015  Blood culture (routine x 2)  BLOOD CULTURE X 2,   R (with STAT occurrences)      05/31/23 2014            Vitals/Pain Today's Vitals   05/31/23 1839 05/31/23 1845 05/31/23 1945 05/31/23 2300  BP:  103/76 106/88 136/85  Pulse:  98 95 83  Resp:  (!) 28 (!) 21 13  Temp: 98.2 F (36.8 C)   98.2 F (36.8 C)  TempSrc: Oral   Oral  SpO2:  98% 99% 97%    Isolation Precautions No active isolations  Medications Medications  acetaminophen (TYLENOL) tablet 650 mg (has no administration in time range)    Or  acetaminophen (TYLENOL)  suppository 650 mg (has no administration in time range)  melatonin tablet 3 mg (has no administration in time range)  ondansetron (ZOFRAN) injection 4 mg (has no administration in time range)  cefTRIAXone (ROCEPHIN) 1 g in sodium chloride 0.9 % 100 mL IVPB (has no administration in time range)  lactated ringers infusion (has no administration in time range)  aspirin chewable tablet 81 mg (has no administration in time range)  atorvastatin (LIPITOR) tablet 80 mg (has no administration in time range)  carvedilol (COREG) tablet 25 mg (has no administration in time range)  cefTRIAXone (ROCEPHIN) 1 g in sodium chloride 0.9 % 100 mL IVPB (0 g Intravenous Stopped 05/31/23 2323)  lactated ringers bolus 500 mL (0 mLs Intravenous Stopped 06/01/23 0006)    Mobility walks     Focused Assessments    R Recommendations: See Admitting Provider Note  Report given to:   Additional Notes:

## 2023-06-02 DIAGNOSIS — G9341 Metabolic encephalopathy: Secondary | ICD-10-CM | POA: Diagnosis not present

## 2023-06-02 LAB — CBC WITH DIFFERENTIAL/PLATELET
Abs Immature Granulocytes: 0.05 10*3/uL (ref 0.00–0.07)
Basophils Absolute: 0.1 10*3/uL (ref 0.0–0.1)
Basophils Relative: 1 %
Eosinophils Absolute: 0.2 10*3/uL (ref 0.0–0.5)
Eosinophils Relative: 2 %
HCT: 37 % — ABNORMAL LOW (ref 39.0–52.0)
Hemoglobin: 12.4 g/dL — ABNORMAL LOW (ref 13.0–17.0)
Immature Granulocytes: 0 %
Lymphocytes Relative: 12 %
Lymphs Abs: 1.5 10*3/uL (ref 0.7–4.0)
MCH: 32.2 pg (ref 26.0–34.0)
MCHC: 33.5 g/dL (ref 30.0–36.0)
MCV: 96.1 fL (ref 80.0–100.0)
Monocytes Absolute: 1 10*3/uL (ref 0.1–1.0)
Monocytes Relative: 8 %
Neutro Abs: 9.9 10*3/uL — ABNORMAL HIGH (ref 1.7–7.7)
Neutrophils Relative %: 77 %
Platelets: 196 10*3/uL (ref 150–400)
RBC: 3.85 MIL/uL — ABNORMAL LOW (ref 4.22–5.81)
RDW: 14 % (ref 11.5–15.5)
WBC: 12.7 10*3/uL — ABNORMAL HIGH (ref 4.0–10.5)
nRBC: 0 % (ref 0.0–0.2)

## 2023-06-02 LAB — COMPREHENSIVE METABOLIC PANEL
ALT: 27 U/L (ref 0–44)
AST: 26 U/L (ref 15–41)
Albumin: 2.6 g/dL — ABNORMAL LOW (ref 3.5–5.0)
Alkaline Phosphatase: 68 U/L (ref 38–126)
Anion gap: 6 (ref 5–15)
BUN: 27 mg/dL — ABNORMAL HIGH (ref 8–23)
CO2: 21 mmol/L — ABNORMAL LOW (ref 22–32)
Calcium: 9.6 mg/dL (ref 8.9–10.3)
Chloride: 106 mmol/L (ref 98–111)
Creatinine, Ser: 1.02 mg/dL (ref 0.61–1.24)
GFR, Estimated: >60 mL/min (ref >60)
Glucose, Bld: 83 mg/dL (ref 70–99)
Potassium: 4.2 mmol/L (ref 3.5–5.1)
Sodium: 133 mmol/L — ABNORMAL LOW (ref 135–145)
Total Bilirubin: 0.6 mg/dL (ref 0.3–1.2)
Total Protein: 6.1 g/dL — ABNORMAL LOW (ref 6.5–8.1)

## 2023-06-02 LAB — URINE CULTURE: Culture: >=100000 — AB

## 2023-06-02 LAB — CULTURE, BLOOD (ROUTINE X 2)

## 2023-06-02 LAB — PHOSPHORUS: Phosphorus: 2 mg/dL — ABNORMAL LOW (ref 2.5–4.6)

## 2023-06-02 LAB — MAGNESIUM: Magnesium: 2 mg/dL (ref 1.7–2.4)

## 2023-06-02 MED ORDER — CEFDINIR 300 MG PO CAPS
300.0000 mg | ORAL_CAPSULE | Freq: Two times a day (BID) | ORAL | Status: DC
  Administered 2023-06-02 – 2023-06-03 (×3): 300 mg via ORAL
  Filled 2023-06-02 (×3): qty 1

## 2023-06-02 NOTE — Hospital Course (Signed)
82 year old lives alone.male CABG X 03/2016 Dr. Maren Beach Last EF 07/2022 25-30% global hypokinesis severe functional MR with marked left atrial enlargement Severe MR declined MitraClip 04/2022 Chronic A-fib status post cardioversion 2017 no longer on anticoagulation 2/2 recurrent epistaxis

## 2023-06-02 NOTE — Progress Notes (Signed)
Physical Therapy Treatment Patient Details Name: [name] [name] MRN: [id number] DOB: 06-Nov-1941 Today's Date: 06/02/2023   History of Present Illness Pt is an 82 y.o. male who presented 05/31/23 with AMS with concern for sepsis due to cystitis. PMH: HFmrEF, CAD, s/p CABG x4, PAD, Afib, HLD, CKD III.    PT Comments    Pt demo mod I bed mobility and transfers. He required supervision/min guard assist amb 150' with SPC. Mildy unsteady but no overt LOB. He returned to bed at end of session. Pt is eager to return home.    Recommendations for follow up therapy are one component of a multi-disciplinary discharge planning process, led by the attending physician.  Recommendations may be updated based on patient status, additional functional criteria and insurance authorization.  Follow Up Recommendations       Assistance Recommended at Discharge Intermittent Supervision/Assistance  Patient can return home with the following Help with stairs or ramp for entrance;Assistance with cooking/housework   Equipment Recommendations  None recommended by PT    Recommendations for Other Services       Precautions / Restrictions Precautions Precautions: Fall Precaution Comments: denies falls at home     Mobility  Bed Mobility Overal bed mobility: Modified Independent                  Transfers Overall transfer level: Modified independent Equipment used: Straight cane                    Ambulation/Gait Ambulation/Gait assistance: Supervision, Min guard Gait Distance (Feet): 150 Feet Assistive device: Straight cane Gait Pattern/deviations: Step-through pattern, Decreased stride length, Drifts right/left Gait velocity: WFL Gait velocity interpretation: >2.62 ft/sec, indicative of community ambulatory   General Gait Details: mildly unsteady with turns, maneuvering around obstacles. No overt LOB   Stairs             Wheelchair Mobility    Modified Rankin (Stroke  Patients Only)       Balance Overall balance assessment: Needs assistance Sitting-balance support: No upper extremity supported, Feet supported Sitting balance-Leahy Scale: Good     Standing balance support: Single extremity supported, During functional activity Standing balance-Leahy Scale: Fair                              Cognition Arousal/Alertness: Awake/alert Behavior During Therapy: WFL for tasks assessed/performed Overall Cognitive Status: Within Functional Limits for tasks assessed                                 General Comments: eager to d/c home        Exercises      General Comments General comments (skin integrity, edema, etc.): HR 100 after amb. No SOB/DOE.      Pertinent Vitals/Pain Pain Assessment Pain Assessment: No/denies pain    Home Living                          Prior Function            PT Goals (current goals can now be found in the care plan section) Acute Rehab PT Goals Patient Stated Goal: home asap Progress towards PT goals: Progressing toward goals    Frequency    Min 3X/week      PT Plan Current plan remains appropriate    Co-evaluation  AM-PAC PT "6 Clicks" Mobility   Outcome Measure  Help needed turning from your back to your side while in a flat bed without using bedrails?: None Help needed moving from lying on your back to sitting on the side of a flat bed without using bedrails?: None Help needed moving to and from a bed to a chair (including a wheelchair)?: None Help needed standing up from a chair using your arms (e.g., wheelchair or bedside chair)?: None Help needed to walk in hospital room?: A Little Help needed climbing 3-5 steps with a railing? : A Lot 6 Click Score: 21    End of Session Equipment Utilized During Treatment: Gait belt Activity Tolerance: Patient tolerated treatment well Patient left: in bed;with call bell/phone within reach Nurse  Communication: Mobility status PT Visit Diagnosis: Other abnormalities of gait and mobility (R26.89)     Time: 1610-9604 PT Time Calculation (min) (ACUTE ONLY): 18 min  Charges:  $Gait Training: 8-22 mins                     Ferd Glassing., PT  Office # [phone]    Ilda Foil 06/02/2023, 8:35 AM

## 2023-06-02 NOTE — TOC Initial Note (Signed)
Transition of Care [facility]) - Initial/Assessment Note    Patient Details  Name: [name] [name] MRN: [id number] Date of Birth: 12-Jun-1941  Transition of [facility]) CM/SW Contact:    Janae Bridgeman, RN Phone Number: 06/02/2023, 11:15 AM  Clinical Narrative:                 CM met with the patient at the bedside to discuss TOC needs.  The patient lives alone and has neighbors nearby that assist as needed.  I spoke with the patient regarding needed home health services and he was resistant to having caregivers in his home.  The patient was agreeable to having me speak with his family to arrange.  I called and spoke with Cloria Spring - [phone].  I called and spoke with her and she and her husband live in [place] and plan to drive down to assist with the patient's needs in the next 1-2 days.  The patient has neighbors that will assist in the meantime if patient is discharged.  I spoke with the daughter and gave Medicare choice regarding home health and she was agreeable to PT, aide in the home.  I called [facility] and Kandee Keen, RNCM accepted for home health services.  HH orders placed to be co-signed by the MD.  The patient has scale, shower seat, RW and Cane in the home.  The patient states that he drives.  The daughter states that the patient's wife died in 02/24/2023 and prior to admission the patient's dog died and he was attempting to dig a hole and bury the dog in the backyard when he fell and was assisted by neighbors.  I called [employer] and patient was approved for Meal assistance through [employer].  I provided the daughter with contact information - [phone] to call and arrange meals when they arrive.  Contact was placed in the AVS.  Patient has PCP - hospital follow up scheduled on 06/04/23 at 11 am that was previously arranged when I contacted the office.  Expected Discharge Plan: Home w Home Health Services Barriers to Discharge: Continued Medical Work up   Patient  Goals and CMS Choice Patient states their goals for this hospitalization and ongoing recovery are:: To return home CMS Medicare.gov Compare Post Acute Care list provided to:: Patient Represenative (must comment) (daughter-in-law, Andersson Schlotter - [phone]) Choice offered to / list presented to : Adult Children [place] ownership interest in [facility].provided to:: Adult Children    Expected Discharge Plan and Services   Discharge Planning Services: CM Consult Post Acute Care Choice: Home Health Living arrangements for the past 2 months: Single Family Home                           HH Arranged: PT, Nurse's Aide HH Agency: [facility] Date [facility] Agency Contacted: 06/02/23 Time HH Agency Contacted: [phone] Representative spoke with at [facility] Agency: Kandee Keen, [facility] accepted for PT, aide  Prior Living Arrangements/Services Living arrangements for the past 2 months: Single Family Home Lives with:: Self (Wife passed away in 24-Feb-2023) Patient language and need for interpreter reviewed:: Yes Do you feel safe going back to the place where you live?: Yes      Need for Family Participation in Patient Care: Yes (Comment) Care giver support system in place?: Yes (comment) Current home services: DME (shower seat, RW, Cane) Criminal Activity/Legal Involvement Pertinent to Current Situation/Hospitalization: No - Comment as  needed  Activities of Daily Living Home Assistive Devices/Equipment: None ADL Screening (condition at time of admission) Patient's cognitive ability adequate to safely complete daily activities?: No Is the patient deaf or have difficulty hearing?: No Does the patient have difficulty seeing, even when wearing glasses/contacts?: No Does the patient have difficulty concentrating, remembering, or making decisions?: Yes Patient able to express need for assistance with ADLs?: Yes Does the patient have difficulty dressing or bathing?: Yes Independently performs  ADLs?: Yes (appropriate for developmental age) Does the patient have difficulty walking or climbing stairs?: No Weakness of Legs: Both Weakness of Arms/Hands: None  Permission Sought/Granted Permission sought to share information with : Case Manager, Magazine features editor, PCP, Family Supports Permission granted to share information with : Yes, Verbal Permission Granted     Permission granted to share info w AGENCY: Home Health - [place] referral placed, set up with [employer] thru [place], PCP appt coordinated  Permission granted to share info w Relationship: son, Daquarius Fitzsimmons, son - [phone]     Emotional Assessment Appearance:: Appears stated age Attitude/Demeanor/Rapport: Gracious Affect (typically observed): Accepting Orientation: : Oriented to Self, Oriented to Place, Oriented to  Time, Oriented to Situation Alcohol / Substance Use: Not Applicable Psych Involvement: No (comment)  Admission diagnosis:  Acute metabolic encephalopathy [G93.41] Patient Active Problem List   Diagnosis Date Noted   Severe sepsis (HCC) 06/01/2023   Acute cystitis 06/01/2023   Hypercalcemia 06/01/2023   Elevated troponin 06/01/2023   Dehydration 06/01/2023   Acute prerenal azotemia 06/01/2023   Chronic systolic CHF (congestive heart failure) (HCC) 06/01/2023   Hyperlipidemia 06/01/2023   CKD stage 3b, GFR 30-44 ml/min (HCC) 06/01/2023   Acute metabolic encephalopathy 05/31/2023   Non-rheumatic mitral regurgitation    Swelling of lower extremity 03/17/2022   Paroxysmal atrial fibrillation (HCC)    Atrial fibrillation (HCC) 03/21/2016   S/P CABG x 4 03/10/2016   CAD (coronary artery disease)    PAD (peripheral artery disease) (HCC)    Bilateral carotid artery disease (HCC)    Essential hypertension    CAD in native artery    Bruit    Hypertensive urgency 03/03/2016   Hypokalemia 03/03/2016   Bilateral pleural effusion 03/03/2016   Acute combined systolic and  diastolic heart failure (HCC)    Hypertensive heart disease with heart failure (HCC)    PCP:  Joycelyn Rua, MD Pharmacy:   [facility] [phone] - [place], [place] - [address] [place] [address] Phone: [phone] Fax: [phone]     Social Determinants of Health (SDOH) Social History: SDOH Screenings   Food Insecurity: No Food Insecurity (06/01/2023)  Housing: Low Risk  (06/01/2023)  Transportation Needs: No Transportation Needs (06/01/2023)  Utilities: Not At Risk (06/01/2023)  Alcohol Screen: Low Risk  (03/19/2022)  Financial Resource Strain: Low Risk  (03/19/2022)  Tobacco Use: Medium Risk (05/31/2023)   SDOH Interventions:     Readmission Risk Interventions    06/02/2023   11:00 AM  Readmission Risk Prevention Plan  Post Dischage Appt Complete  Medication Screening Complete  Transportation Screening Complete

## 2023-06-02 NOTE — Progress Notes (Signed)
PROGRESS NOTE   [name] [name]  ZOX:[id number] DOB: Nov 18, 1941 DOA: 05/31/2023 PCP: Joycelyn Rua, MD  Brief Narrative:   82 year old lives alone.male CABG X 03/2016 Dr. Maren Beach Last EF 07/2022 25-30% global hypokinesis severe functional MR with marked left atrial enlargement Severe MR declined MitraClip 04/2022 Chronic A-fib status post cardioversion 2017 no longer on anticoagulation 2/2 recurrent epistaxis--- previous heart failure clinic baseline weight 128 pounds  Present [facility] ED unwitnessed fall found in backyard-apparently had been digging a hole for 30 to 40 minutes-he was found down trauma workup included negative CT head and CT C-spine CXR pelvic x-ray CBC showed leukocytosis UA?  UTI on arrival oriented to person place not time-?  Metabolic encephalopathy 2/2 UTI EKG negative troponin elevated 79 Rx LR 500 cc bolus, Rocephin in ED with a working diagnosis of severe sepsis with metabolic encephalopathy from a UTI-was also hypercalcemic possibly secondary to volume depletion on arrival troponin trends were gray zone in the 70s patient was given saline 50 cc/change for 6 hours-Home Lasix lisinopril held Coreg ultimately held given atrial fibrillation with slow ventricular response and some pauses   Hospital-Problem based course  Toxic metabolic encephalopathy secondary to severe sepsis from UTI Much improved-mentation is better-patient states he wants to go home He has ambulated around the unit with therapy services Urine culture gram-negative rods >100,000-culture pending-- -->Omnicef 6/19-observe for fever  Systolic heart failure A-fib with slow ventricular response CHADVASC >4-not on anticoagulation 2/2 epistaxis recurrent Baseline weight 128 pounds currently 122 Lisinopril, Lasix, Coreg held D/W Dr. Gala Romney--- will CC Tricia/him at time of discharge as may need reconsideration for either long-acting beta-blocker VS other rate control strategy-he is at risk for stroke given  advanced age etc.  [place]ull[place] zone troponin Troponin peak 131-prior CABG-no chest pain Over read of EKG on arrival shows SVR-compared to prior EKG 03/18/2022 rate is slower-IC PVC  AKI on admission superimposed on underlying CKD 3B (creatinine on arrival 41/1.6) Much improved-no further Lasix  Fall, delirium Possibly mediated secondary to heat exhaustion on arrival + beta-blocker + Lasix See above discussion regarding meds  DVT prophylaxis: SCD Code Status: Full Family Communication:  Called the patient's son [name] [name] [phone]---FAOZHYQM and discussed Sons wife name [name] [name]--[phone] Disposition:  Status is: Inpatient Remains inpatient appropriate because:   Need UC   Subjective:  Awake coherent Wants to know "when can I go home"  Objective: Vitals:   06/01/23 1100 06/01/23 2038 06/02/23 0429 06/02/23 0729  BP: (!) 128/48 (!) 135/57 (!) 150/67 (!) 162/65  Pulse: 66  (!) 59 73  Resp: 17 18 18 18   Temp: (!) 97.4 F (36.3 C) 97.7 F (36.5 C) 97.7 F (36.5 C) 98.2 F (36.8 C)  TempSrc: Oral     SpO2: 99% 98% 98% 98%    Intake/Output Summary (Last 24 hours) at 06/02/2023 0910 Last data filed at 06/02/2023 2841 Gross per 24 hour  Intake 218 ml  Output 150 ml  Net 68 ml   There were no vitals filed for this visit.  Examination:  Awake coherent eomi ncat, tongue protrudes midline--has partial dentries S1 s2 no m/r/g--Midline scar Cta b no added sound Abd soft nt nd Neuro intact moving 4 limbs and moving  Data Reviewed: personally reviewed   CBC    Component Value Date/Time   WBC 12.7 (H) 06/02/2023 0234   RBC 3.85 (L) 06/02/2023 0234   HGB 12.4 (L) 06/02/2023 0234   HGB 15.1 06/06/2020 1142   HCT 37.0 (L) 06/02/2023 0234   HCT 44.6  06/06/2020 1142   PLT 196 06/02/2023 0234   PLT 149 (L) 06/06/2020 1142   MCV 96.1 06/02/2023 0234   MCV 95 06/06/2020 1142   MCH 32.2 06/02/2023 0234   MCHC 33.5 06/02/2023 0234   RDW 14.0 06/02/2023 0234   RDW 12.6  06/06/2020 1142   LYMPHSABS 1.5 06/02/2023 0234   MONOABS 1.0 06/02/2023 0234   EOSABS 0.2 06/02/2023 0234   BASOSABS 0.1 06/02/2023 0234      Latest Ref Rng & Units 06/02/2023    2:34 AM 06/01/2023   12:57 AM 06/01/2023   12:35 AM  CMP  Glucose 70 - 99 mg/dL 83   161   BUN 8 - 23 mg/dL 27   39   Creatinine 0.96 - 1.24 mg/dL 0.45   4.09   Sodium 811 - 145 mmol/L 133  141  137   Potassium 3.5 - 5.1 mmol/L 4.2  4.3  4.2   Chloride 98 - 111 mmol/L 106   109   CO2 22 - 32 mmol/L 21   20   Calcium 8.9 - 10.3 mg/dL 9.6   91.4   Total Protein 6.5 - 8.1 g/dL 6.1   6.9   Total Bilirubin 0.3 - 1.2 mg/dL 0.6   0.5   Alkaline Phos 38 - 126 U/L 68   81   AST 15 - 41 U/L 26   53   ALT 0 - 44 U/L 27   41      Radiology Studies: ECHOCARDIOGRAM COMPLETE  Result Date: 06/01/2023    ECHOCARDIOGRAM REPORT   Patient Name:   [name] [name] Date of Exam: 06/01/2023 Medical Rec #:  [id number]   Height:       62.0 in Accession #:    [id number]  Weight:       123.0 lb Date of Birth:  Mar 21, 1941   BSA:          1.555 m Patient Age:    81 years    BP:           121/78 mmHg Patient Gender: M           HR:           49 bpm. Exam Location:  Inpatient Procedure: 2D Echo, Cardiac Doppler and Color Doppler Indications:    Elevated Troponin  History:        Patient has prior history of Echocardiogram examinations, most                 recent 08/04/2022. CHF, CAD, Prior CABG, PAD, Arrythmias:Atrial                 Fibrillation; Risk Factors:Hypertension and Dyslipidemia. CKD,                 stage 3.  Sonographer:    Lucendia Herrlich Referring Phys: [id number] CAROLE N HALL IMPRESSIONS  1. Left ventricular ejection fraction, by estimation, is 45 to 50%. The left ventricle has mildly decreased function. The left ventricle demonstrates regional wall motion abnormalities (see scoring diagram/findings for description). Left ventricular diastolic function could not be evaluated. There is moderate hypokinesis of the left ventricular,  basal-mid inferolateral wall and lateral wall. There is moderate hypokinesis of the left ventricular, basal inferior wall.  2. Right ventricular systolic function is mildly reduced. The right ventricular size is normal. There is normal pulmonary artery systolic pressure. The estimated right ventricular systolic pressure is 53.9 mmHg.  3. Left atrial size was severely dilated.  4. Right atrial size was severely dilated.  5. The mitral valve is normal in structure. Mild to moderate mitral valve regurgitation.  6. Tricuspid valve regurgitation is mild to moderate.  7. The aortic valve is tricuspid. There is mild thickening of the aortic valve. Aortic valve regurgitation is mild. Aortic valve sclerosis is present, with no evidence of aortic valve stenosis. Comparison(s): The left ventricular function has improved. The left ventricular wall motion abnormalities are unchanged. There is marked improvement in mitral regurgitation severity. Estimated systolic PA pressure is lower, although RA pressure is higher. FINDINGS  Left Ventricle: Left ventricular ejection fraction, by estimation, is 45 to 50%. The left ventricle has mildly decreased function. The left ventricle demonstrates regional wall motion abnormalities. Moderate hypokinesis of the left ventricular, basal-mid inferolateral wall and lateral wall. Moderate hypokinesis of the left ventricular, basal inferior wall. The left ventricular internal cavity size was normal in size. There is no left ventricular hypertrophy. Left ventricular diastolic function could not be evaluated due to atrial fibrillation. Left ventricular diastolic function could not be evaluated.  LV Wall Scoring: The antero-lateral wall, posterior wall, and basal inferior segment are hypokinetic. Right Ventricle: The right ventricular size is normal. No increase in right ventricular wall thickness. Right ventricular systolic function is mildly reduced. There is normal pulmonary artery systolic  pressure. The tricuspid regurgitant velocity is 3.12 m/s, and with an assumed right atrial pressure of 15 mmHg, the estimated right ventricular systolic pressure is 53.9 mmHg. Left Atrium: Left atrial size was severely dilated. Right Atrium: Right atrial size was severely dilated. Pericardium: There is no evidence of pericardial effusion. Mitral Valve: The mitral valve is normal in structure. Mild to moderate mitral valve regurgitation, with centrally-directed jet. Tricuspid Valve: The tricuspid valve is normal in structure. Tricuspid valve regurgitation is mild to moderate. Aortic Valve: The aortic valve is tricuspid. There is mild thickening of the aortic valve. Aortic valve regurgitation is mild. Aortic regurgitation PHT measures 486 msec. Aortic valve sclerosis is present, with no evidence of aortic valve stenosis. Aortic valve peak gradient measures 6.7 mmHg. Pulmonic Valve: The pulmonic valve was grossly normal. Pulmonic valve regurgitation is mild. No evidence of pulmonic stenosis. Aorta: The aortic root and ascending aorta are structurally normal, with no evidence of dilitation. IAS/Shunts: No atrial level shunt detected by color flow Doppler.  LEFT VENTRICLE PLAX 2D LVIDd:         4.10 cm      Diastology LVIDs:         3.80 cm      LV e' medial:    5.00 cm/s LV PW:         1.10 cm      LV E/e' medial:  19.6 LV IVS:        1.30 cm      LV e' lateral:   9.49 cm/s LVOT diam:     1.80 cm      LV E/e' lateral: 10.3 LV SV:         57 LV SV Index:   37 LVOT Area:     2.54 cm  LV Volumes (MOD) LV vol d, MOD A2C: 113.0 ml LV vol d, MOD A4C: 90.2 ml LV vol s, MOD A2C: 58.8 ml LV vol s, MOD A4C: 48.6 ml LV SV MOD A2C:     54.3 ml LV SV MOD A4C:     90.2 ml LV SV MOD BP:      47.6 ml RIGHT VENTRICLE  IVC RV S prime:     8.36 cm/s  IVC diam: 2.20 cm TAPSE (M-mode): 1.2 cm LEFT ATRIUM           Index        RIGHT ATRIUM           Index LA diam:      3.90 cm 2.51 cm/m   RA Area:     24.70 cm LA Vol (A2C):  78.3 ml 50.36 ml/m  RA Volume:   67.00 ml  43.09 ml/m LA Vol (A4C): 82.1 ml 52.80 ml/m  AORTIC VALVE                 PULMONIC VALVE AV Area (Vmax): 2.04 cm     PR End Diast Vel: 2.17 msec AV Vmax:        129.00 cm/s AV Peak Grad:   6.7 mmHg LVOT Vmax:      103.57 cm/s LVOT Vmean:     64.933 cm/s LVOT VTI:       0.225 m AI PHT:         486 msec  AORTA Ao Root diam: 3.30 cm Ao Asc diam:  3.10 cm MITRAL VALVE                  TRICUSPID VALVE MV Area (PHT): 3.40 cm       TR Peak grad:   38.9 mmHg MV Decel Time: 223 msec       TR Vmax:        312.00 cm/s MR Peak grad:    109.6 mmHg MR Mean grad:    81.0 mmHg    SHUNTS MR Vmax:         523.33 cm/s  Systemic VTI:  0.22 m MR Vmean:        433.0 cm/s   Systemic Diam: 1.80 cm MR PISA:         2.74 cm MR PISA Eff ROA: 16 mm MR PISA Radius:  0.66 cm MV E velocity: 97.90 cm/s Rachelle Hora Croitoru MD Electronically signed by Thurmon Fair MD Signature Date/Time: 06/01/2023/5:29:32 PM    Final    DG Pelvis Portable  Result Date: 05/31/2023 CLINICAL DATA:  Syncope and fall. EXAM: PORTABLE PELVIS 1-2 VIEWS COMPARISON:  None Available. FINDINGS: Faint lucency through the neck of the left femur may be artifactual or represent a fracture. Clinical correlation is recommended. No other acute fracture or dislocation on this single provided image. The bones are osteopenic. Mild bilateral hip arthritic changes. Degenerative changes of the lower lumbar spine. Atherosclerotic calcification of the aorta and iliac arteries. The soft tissues are unremarkable IMPRESSION: Artifact versus possible left femoral neck fracture. Clinical correlation is recommended. Electronically Signed   By: Elgie Collard M.D.   On: 05/31/2023 19:42   CT Head Wo Contrast  Result Date: 05/31/2023 CLINICAL DATA:  Fall, loss of consciousness EXAM: CT HEAD WITHOUT CONTRAST CT CERVICAL SPINE WITHOUT CONTRAST TECHNIQUE: Multidetector CT imaging of the head and cervical spine was performed following the standard  protocol without intravenous contrast. Multiplanar CT image reconstructions of the cervical spine were also generated. RADIATION DOSE REDUCTION: This exam was performed according to the departmental dose-optimization program which includes automated exposure control, adjustment of the mA and/or kV according to patient size and/or use of iterative reconstruction technique. COMPARISON:  None Available. FINDINGS: CT HEAD FINDINGS Brain: No evidence of acute infarction, hemorrhage, hydrocephalus, extra-axial collection or mass lesion/mass effect. Extensive periventricular and deep white matter hypodensity. Nonacute  encephalomalacia of the right occipital lobe (series 3, image 12). Vascular: No hyperdense vessel or unexpected calcification. Skull: Normal. Negative for fracture or focal lesion. Sinuses/Orbits: No acute finding. Other: None. CT CERVICAL SPINE FINDINGS Alignment: Normal. Skull base and vertebrae: No acute fracture. No primary bone lesion or focal pathologic process. Soft tissues and spinal canal: No prevertebral fluid or swelling. No visible canal hematoma. Disc levels: Severe multilevel disc degenerative disease and osteophytosis throughout the cervical spine, worst from C3-C7. Upper chest: Negative. Other: None. IMPRESSION: 1. No acute intracranial pathology. 2. Advanced small-vessel white matter disease and nonacute encephalomalacia of the right occipital lobe. 3. No fracture or static subluxation of the cervical spine. 4. Severe multilevel disc degenerative disease and osteophytosis throughout the cervical spine, worst from C3-C7. Electronically Signed   By: Jearld Lesch M.D.   On: 05/31/2023 19:41   CT Cervical Spine Wo Contrast  Result Date: 05/31/2023 CLINICAL DATA:  Fall, loss of consciousness EXAM: CT HEAD WITHOUT CONTRAST CT CERVICAL SPINE WITHOUT CONTRAST TECHNIQUE: Multidetector CT imaging of the head and cervical spine was performed following the standard protocol without intravenous  contrast. Multiplanar CT image reconstructions of the cervical spine were also generated. RADIATION DOSE REDUCTION: This exam was performed according to the departmental dose-optimization program which includes automated exposure control, adjustment of the mA and/or kV according to patient size and/or use of iterative reconstruction technique. COMPARISON:  None Available. FINDINGS: CT HEAD FINDINGS Brain: No evidence of acute infarction, hemorrhage, hydrocephalus, extra-axial collection or mass lesion/mass effect. Extensive periventricular and deep white matter hypodensity. Nonacute encephalomalacia of the right occipital lobe (series 3, image 12). Vascular: No hyperdense vessel or unexpected calcification. Skull: Normal. Negative for fracture or focal lesion. Sinuses/Orbits: No acute finding. Other: None. CT CERVICAL SPINE FINDINGS Alignment: Normal. Skull base and vertebrae: No acute fracture. No primary bone lesion or focal pathologic process. Soft tissues and spinal canal: No prevertebral fluid or swelling. No visible canal hematoma. Disc levels: Severe multilevel disc degenerative disease and osteophytosis throughout the cervical spine, worst from C3-C7. Upper chest: Negative. Other: None. IMPRESSION: 1. No acute intracranial pathology. 2. Advanced small-vessel white matter disease and nonacute encephalomalacia of the right occipital lobe. 3. No fracture or static subluxation of the cervical spine. 4. Severe multilevel disc degenerative disease and osteophytosis throughout the cervical spine, worst from C3-C7. Electronically Signed   By: Jearld Lesch M.D.   On: 05/31/2023 19:41   DG Chest Portable 1 View  Result Date: 05/31/2023 CLINICAL DATA:  Syncope and fall. EXAM: PORTABLE CHEST 1 VIEW COMPARISON:  Chest radiograph dated 03/17/2022. FINDINGS: No focal consolidation, pleural effusion, or pneumothorax. Mild cardiomegaly. Median sternotomy wires and CABG vascular clips. Left atrial appendage occlusion  device. Atherosclerotic calcification of the aorta. No acute osseous pathology. IMPRESSION: 1. No acute cardiopulmonary process. 2. Mild cardiomegaly. Electronically Signed   By: Elgie Collard M.D.   On: 05/31/2023 19:40     Scheduled Meds:  aspirin  81 mg Oral Daily   atorvastatin  80 mg Oral Daily   cefdinir  300 mg Oral Q12H   Continuous Infusions:   LOS: 2 days   Time spent: 58  Rhetta Mura, MD [facility] To contact the attending provider between 7A-7P or the covering provider during after hours 7P-7A, please log into the web site [email or web address] and access using universal [place] password for that web site. If you do not have the password, please call the hospital operator.  06/02/2023, 9:10 AM

## 2023-06-03 DIAGNOSIS — G9341 Metabolic encephalopathy: Secondary | ICD-10-CM | POA: Diagnosis not present

## 2023-06-03 LAB — CULTURE, BLOOD (ROUTINE X 2)

## 2023-06-03 NOTE — Discharge Summary (Signed)
Physician Discharge Summary  [name] [name] NWG:[id number] DOB: 01-Oct-1941 DOA: 05/31/2023  PCP: Joycelyn Rua, MD  Admit date: 05/31/2023 Discharge date: 06/03/2023  Time spent: 36 minutes  Recommendations for Outpatient Follow-up:  Please keep appointment to be set up with advanced heart failure team-CC Dr. Hiram Gash clinic at time of discharge to ensure that pa yes you are sleeping tient is seen and examined and taken care of Would recommend labs in about a week including chemistries and magnesium to determine how to proceed with medications and adjustments of the same Probably may need some bereavement counseling  Discharge Diagnoses:  MAIN problem for hospitalization   Heat exhaustion/volume depletion resulting in falls and bradycardia Unconfirmed urinary tract infection on admission-final diagnosis probable asymptomatic bacteriuria Chronic systolic heart failure with slow ventricular response A-fib Gray zone troponin AKI superimposed on CKd 3B on admission  Please see below for itemized issues addressed in HOpsital- refer to other progress notes for clarity if needed  Discharge Condition: Improved  Diet recommendation: Heart healthy  There were no vitals filed for this visit.  History of present illness:  82 year old lives alone.male CABG X 03/2016 Dr. Maren Beach Last EF 07/2022 25-30% global hypokinesis severe functional MR with marked left atrial enlargement Severe MR declined MitraClip 04/2022 Chronic A-fib status post cardioversion 2017 no longer on anticoagulation 2/2 recurrent epistaxis--- previous heart failure clinic baseline weight 128 pounds   Present [facility] ED unwitnessed fall found in backyard-apparently had been digging a hole for 30 to 40 minutes-he was found down trauma workup included negative CT head and CT C-spine CXR pelvic x-ray CBC showed leukocytosis UA?  UTI on arrival oriented to person place not time-?  Metabolic encephalopathy 2/2 UTI EKG negative  troponin elevated 79 Rx LR 500 cc bolus, Rocephin in ED with a working diagnosis of severe sepsis with metabolic encephalopathy from a UTI-was also hypercalcemic possibly secondary to volume depletion on arrival troponin trends were gray zone in the 70s patient was given saline 50 cc/change for 6 hours-Home Lasix lisinopril held Coreg ultimately held given atrial fibrillation with slow ventricular response and some pauses  Hospital Course:  Toxic metabolic encephalopathy secondary to severe sepsis from UTI Much improved-mentation is better-patient states he wants to go home He has ambulated around the unit with therapy services Urine culture gram-negative rods >100,000-E. coli grew but I think that this was asymptomatic bacteriuria in retrospect at time of discharge-the patient did not have any symptoms of dysuria strangury and or burning and we discontinued the Omnicef that the patient was de-escalated to on 6/20-day of discharge   Systolic heart failure A-fib with slow ventricular response CHADVASC >4-not on anticoagulation 2/2 epistaxis recurrent Baseline weight 128 pounds currently 122 Lisinopril, Lasix, Coreg held D/W Dr. Gala Romney 6/19--- will CC Tricia/him at time of discharge as may need reconsideration for either long-acting beta-blocker VS other rate control strategy-he is at risk for stroke given advanced age etc. Decision made at time of discharge to HOLD Plavix but instead just continue aspirin-ideally the patient should be on DOAC secondary to high risk of stroke however given the fact that he has recurrent epistaxis on DOAC I will defer this to the collective expertise of cardiology as an outpatient Will need a TOC appointment to discuss this   Wallace Cullens zone troponin Troponin peak 131-prior CABG-no chest pain Over read of EKG on arrival shows SVR-compared to prior EKG 03/18/2022 rate is slower-IC PVC   AKI on admission superimposed on underlying CKD 3B (creatinine on arrival  41/1.6) Much improved-no further Lasix for now and instead will need 1 to 2-week follow-up at a [facility] visit with cardiology  Fall, delirium Possibly mediated secondary to heat exhaustion on arrival + beta-blocker + Lasix See above discussion regarding meds    Discharge Exam: Vitals:   06/03/23 0745 06/03/23 0745  BP: (!) 170/67   Pulse: (!) 40 70  Resp: 18   Temp: 98.3 F (36.8 C)   SpO2: 97% 97%    Subj on day of d/c   Awake coherent no distress looks well feels well no chest pain no fever no nausea no vomiting Eating and drinking fairly well without any deficits Tolerating diet in addition to moving around some   General Exam on discharge  EOMI NCAT quite frail no icterus no pallor no wheeze no rales or rhonchi ROM is intact Chest is clear seems to have irregularly irregular heart rate No L Extremity edema Neuro intact no focal deficit   Discharge Instructions   Discharge Instructions     Diet - low sodium heart healthy   Complete by: As directed    Discharge instructions   Complete by: As directed    Do not take Plavix until you are followed by cardiology-as you had a fall and you are at high risk for bleeding into your brain if you hit your head-will try and set up an appointment for you to follow-up with them-you may take aspirin as you have previously been taking It would be a good idea to stop a bunch of your blood pressure as well as your heart rate medications given the way that you had a fall and weakness Please continue Farxiga  Please ensure that you allow case management/home health nurse to come out and help you adjust your meds and ensure that you are doing fair  For fevers chills nausea vomiting chest pain dark stool tarry stool or vomiting of blood please come to the emergency room immediately   Increase activity slowly   Complete by: As directed    No wound care   Complete by: As directed       Allergies as of 06/03/2023   No Known  Allergies      Medication List     STOP taking these medications    carvedilol 25 MG tablet Commonly known as: COREG   clopidogrel 75 MG tablet Commonly known as: PLAVIX   furosemide 40 MG tablet Commonly known as: LASIX   Klor-Con M20 20 MEQ tablet Generic drug: potassium chloride SA   losartan 25 MG tablet Commonly known as: COZAAR   spironolactone 25 MG tablet Commonly known as: ALDACTONE       TAKE these medications    Aspirin Low Dose 81 MG chewable tablet Generic drug: aspirin Chew 1 tablet (81 mg total) by mouth daily. What changed: when to take this   atorvastatin 80 MG tablet Commonly known as: LIPITOR TAKE 1 TABLET BY MOUTH DAILY AT 6 PM. What changed: when to take this   dapagliflozin propanediol 10 MG Tabs tablet Commonly known as: Farxiga Take 1 tablet (10 mg total) by mouth daily before breakfast.       No Known Allergies  Follow-up Information     [facility] Follow up.   Specialty: Home Health Services Why: [facility] will provide home health PT/ home health aide.  They will call you in the next 24-48 hours to set up services. Contact information: [address] [place] [address]  [phone]         [facility] Follow up.   Why: [facility] will be contacting you to set up meal delivery for 2 meals a day for the next 7 days, which is a benefit of your insurance provider.  Call [employer] to set up meal coordination for 7 days - [phone]        Sheliah Hatch, PA-C. Go on 06/04/2023.   Specialty: Family Medicine Why: You are scheduled with your PCP on Friday, 06/04/23 at 11 am.  Please call the office if you need to reschedule. Contact information: [address] [place] [address] [phone]                  The results of significant diagnostics from this hospitalization (including imaging, microbiology, ancillary and laboratory) are listed below for  reference.    Significant Diagnostic Studies: ECHOCARDIOGRAM COMPLETE  Result Date: 06/01/2023    ECHOCARDIOGRAM REPORT   Patient Name:   QUINNLAN HARDNETT Date of Exam: 06/01/2023 Medical Rec #:  [id number]   Height:       62.0 in Accession #:    [id number]  Weight:       123.0 lb Date of Birth:  06/08/1941   BSA:          1.555 m Patient Age:    81 years    BP:           121/78 mmHg Patient Gender: M           HR:           49 bpm. Exam Location:  Inpatient Procedure: 2D Echo, Cardiac Doppler and Color Doppler Indications:    Elevated Troponin  History:        Patient has prior history of Echocardiogram examinations, most                 recent 08/04/2022. CHF, CAD, Prior CABG, PAD, Arrythmias:Atrial                 Fibrillation; Risk Factors:Hypertension and Dyslipidemia. CKD,                 stage 3.  Sonographer:    Lucendia Herrlich Referring Phys: [id number] CAROLE N HALL IMPRESSIONS  1. Left ventricular ejection fraction, by estimation, is 45 to 50%. The left ventricle has mildly decreased function. The left ventricle demonstrates regional wall motion abnormalities (see scoring diagram/findings for description). Left ventricular diastolic function could not be evaluated. There is moderate hypokinesis of the left ventricular, basal-mid inferolateral wall and lateral wall. There is moderate hypokinesis of the left ventricular, basal inferior wall.  2. Right ventricular systolic function is mildly reduced. The right ventricular size is normal. There is normal pulmonary artery systolic pressure. The estimated right ventricular systolic pressure is 53.9 mmHg.  3. Left atrial size was severely dilated.  4. Right atrial size was severely dilated.  5. The mitral valve is normal in structure. Mild to moderate mitral valve regurgitation.  6. Tricuspid valve regurgitation is mild to moderate.  7. The aortic valve is tricuspid. There is mild thickening of the aortic valve. Aortic valve regurgitation is mild. Aortic valve  sclerosis is present, with no evidence of aortic valve stenosis. Comparison(s): The left ventricular function has improved. The left ventricular wall motion abnormalities are unchanged. There is marked improvement in mitral regurgitation severity. Estimated systolic PA pressure is lower, although RA pressure is higher. FINDINGS  Left Ventricle: Left ventricular ejection fraction, by estimation, is 45 to 50%. The left ventricle has mildly decreased function. The left ventricle demonstrates regional wall motion abnormalities. Moderate hypokinesis of the left ventricular, basal-mid inferolateral wall and lateral wall. Moderate hypokinesis of the left ventricular, basal inferior wall. The left ventricular internal cavity size was normal in size. There is no left ventricular hypertrophy. Left ventricular diastolic function could not be evaluated due to atrial fibrillation. Left ventricular diastolic function could not be evaluated.  LV Wall Scoring: The antero-lateral wall, posterior wall, and basal inferior segment are hypokinetic. Right Ventricle: The right ventricular size is normal. No increase in right ventricular wall thickness. Right ventricular systolic function is mildly reduced. There is normal pulmonary artery systolic pressure. The tricuspid regurgitant velocity is 3.12 m/s, and with an assumed right atrial pressure of 15 mmHg, the estimated right ventricular systolic pressure is 53.9 mmHg. Left Atrium: Left atrial size was severely dilated. Right Atrium: Right atrial size was severely dilated. Pericardium: There is no evidence of pericardial effusion. Mitral Valve: The mitral valve is normal in structure. Mild to moderate mitral valve regurgitation, with centrally-directed jet. Tricuspid Valve: The tricuspid valve is normal in structure. Tricuspid valve regurgitation is mild to moderate. Aortic Valve: The aortic valve is tricuspid. There is mild thickening of the aortic valve. Aortic valve regurgitation is  mild. Aortic regurgitation PHT measures 486 msec. Aortic valve sclerosis is present, with no evidence of aortic valve stenosis. Aortic valve peak gradient measures 6.7 mmHg. Pulmonic Valve: The pulmonic valve was grossly normal. Pulmonic valve regurgitation is mild. No evidence of pulmonic stenosis. Aorta: The aortic root and ascending aorta are structurally normal, with no evidence of dilitation. IAS/Shunts: No atrial level shunt detected by color flow Doppler.  LEFT VENTRICLE PLAX 2D LVIDd:         4.10 cm      Diastology LVIDs:         3.80 cm      LV e' medial:    5.00 cm/s LV PW:         1.10 cm      LV E/e' medial:  19.6 LV IVS:        1.30 cm      LV e' lateral:   9.49 cm/s LVOT diam:     1.80 cm      LV E/e' lateral: 10.3 LV SV:         57 LV SV Index:   37 LVOT Area:     2.54 cm  LV Volumes (MOD) LV vol d, MOD A2C: 113.0 ml LV vol d, MOD A4C: 90.2 ml LV vol s, MOD A2C: 58.8 ml LV vol s, MOD A4C: 48.6 ml LV SV MOD A2C:     54.3 ml LV SV MOD A4C:     90.2 ml LV SV MOD BP:      47.6 ml RIGHT VENTRICLE            IVC RV S prime:     8.36 cm/s  IVC diam: 2.20 cm TAPSE (M-mode): 1.2 cm LEFT ATRIUM           Index        RIGHT ATRIUM           Index LA diam:      3.90 cm 2.51 cm/m   RA Area:     24.70 cm LA Vol (A2C): 78.3 ml 50.36 ml/m  RA Volume:   67.00 ml  43.09 ml/m  LA Vol (A4C): 82.1 ml 52.80 ml/m  AORTIC VALVE                 PULMONIC VALVE AV Area (Vmax): 2.04 cm     PR End Diast Vel: 2.17 msec AV Vmax:        129.00 cm/s AV Peak Grad:   6.7 mmHg LVOT Vmax:      103.57 cm/s LVOT Vmean:     64.933 cm/s LVOT VTI:       0.225 m AI PHT:         486 msec  AORTA Ao Root diam: 3.30 cm Ao Asc diam:  3.10 cm MITRAL VALVE                  TRICUSPID VALVE MV Area (PHT): 3.40 cm       TR Peak grad:   38.9 mmHg MV Decel Time: 223 msec       TR Vmax:        312.00 cm/s MR Peak grad:    109.6 mmHg MR Mean grad:    81.0 mmHg    SHUNTS MR Vmax:         523.33 cm/s  Systemic VTI:  0.22 m MR Vmean:        433.0 cm/s    Systemic Diam: 1.80 cm MR PISA:         2.74 cm MR PISA Eff ROA: 16 mm MR PISA Radius:  0.66 cm MV E velocity: 97.90 cm/s Rachelle Hora Croitoru MD Electronically signed by Thurmon Fair MD Signature Date/Time: 06/01/2023/5:29:32 PM    Final    DG Pelvis Portable  Result Date: 05/31/2023 CLINICAL DATA:  Syncope and fall. EXAM: PORTABLE PELVIS 1-2 VIEWS COMPARISON:  None Available. FINDINGS: Faint lucency through the neck of the left femur may be artifactual or represent a fracture. Clinical correlation is recommended. No other acute fracture or dislocation on this single provided image. The bones are osteopenic. Mild bilateral hip arthritic changes. Degenerative changes of the lower lumbar spine. Atherosclerotic calcification of the aorta and iliac arteries. The soft tissues are unremarkable IMPRESSION: Artifact versus possible left femoral neck fracture. Clinical correlation is recommended. Electronically Signed   By: Elgie Collard M.D.   On: 05/31/2023 19:42   CT Head Wo Contrast  Result Date: 05/31/2023 CLINICAL DATA:  Fall, loss of consciousness EXAM: CT HEAD WITHOUT CONTRAST CT CERVICAL SPINE WITHOUT CONTRAST TECHNIQUE: Multidetector CT imaging of the head and cervical spine was performed following the standard protocol without intravenous contrast. Multiplanar CT image reconstructions of the cervical spine were also generated. RADIATION DOSE REDUCTION: This exam was performed according to the departmental dose-optimization program which includes automated exposure control, adjustment of the mA and/or kV according to patient size and/or use of iterative reconstruction technique. COMPARISON:  None Available. FINDINGS: CT HEAD FINDINGS Brain: No evidence of acute infarction, hemorrhage, hydrocephalus, extra-axial collection or mass lesion/mass effect. Extensive periventricular and deep white matter hypodensity. Nonacute encephalomalacia of the right occipital lobe (series 3, image 12). Vascular: No  hyperdense vessel or unexpected calcification. Skull: Normal. Negative for fracture or focal lesion. Sinuses/Orbits: No acute finding. Other: None. CT CERVICAL SPINE FINDINGS Alignment: Normal. Skull base and vertebrae: No acute fracture. No primary bone lesion or focal pathologic process. Soft tissues and spinal canal: No prevertebral fluid or swelling. No visible canal hematoma. Disc levels: Severe multilevel disc degenerative disease and osteophytosis throughout the cervical spine, worst from C3-C7. Upper chest: Negative. Other: None. IMPRESSION: 1. No  acute intracranial pathology. 2. Advanced small-vessel white matter disease and nonacute encephalomalacia of the right occipital lobe. 3. No fracture or static subluxation of the cervical spine. 4. Severe multilevel disc degenerative disease and osteophytosis throughout the cervical spine, worst from C3-C7. Electronically Signed   By: Jearld Lesch M.D.   On: 05/31/2023 19:41   CT Cervical Spine Wo Contrast  Result Date: 05/31/2023 CLINICAL DATA:  Fall, loss of consciousness EXAM: CT HEAD WITHOUT CONTRAST CT CERVICAL SPINE WITHOUT CONTRAST TECHNIQUE: Multidetector CT imaging of the head and cervical spine was performed following the standard protocol without intravenous contrast. Multiplanar CT image reconstructions of the cervical spine were also generated. RADIATION DOSE REDUCTION: This exam was performed according to the departmental dose-optimization program which includes automated exposure control, adjustment of the mA and/or kV according to patient size and/or use of iterative reconstruction technique. COMPARISON:  None Available. FINDINGS: CT HEAD FINDINGS Brain: No evidence of acute infarction, hemorrhage, hydrocephalus, extra-axial collection or mass lesion/mass effect. Extensive periventricular and deep white matter hypodensity. Nonacute encephalomalacia of the right occipital lobe (series 3, image 12). Vascular: No hyperdense vessel or unexpected  calcification. Skull: Normal. Negative for fracture or focal lesion. Sinuses/Orbits: No acute finding. Other: None. CT CERVICAL SPINE FINDINGS Alignment: Normal. Skull base and vertebrae: No acute fracture. No primary bone lesion or focal pathologic process. Soft tissues and spinal canal: No prevertebral fluid or swelling. No visible canal hematoma. Disc levels: Severe multilevel disc degenerative disease and osteophytosis throughout the cervical spine, worst from C3-C7. Upper chest: Negative. Other: None. IMPRESSION: 1. No acute intracranial pathology. 2. Advanced small-vessel white matter disease and nonacute encephalomalacia of the right occipital lobe. 3. No fracture or static subluxation of the cervical spine. 4. Severe multilevel disc degenerative disease and osteophytosis throughout the cervical spine, worst from C3-C7. Electronically Signed   By: Jearld Lesch M.D.   On: 05/31/2023 19:41   DG Chest Portable 1 View  Result Date: 05/31/2023 CLINICAL DATA:  Syncope and fall. EXAM: PORTABLE CHEST 1 VIEW COMPARISON:  Chest radiograph dated 03/17/2022. FINDINGS: No focal consolidation, pleural effusion, or pneumothorax. Mild cardiomegaly. Median sternotomy wires and CABG vascular clips. Left atrial appendage occlusion device. Atherosclerotic calcification of the aorta. No acute osseous pathology. IMPRESSION: 1. No acute cardiopulmonary process. 2. Mild cardiomegaly. Electronically Signed   By: Elgie Collard M.D.   On: 05/31/2023 19:40    Microbiology: Recent Results (from the past 240 hour(s))  Urine Culture     Status: Abnormal   Collection Time: 05/31/23  7:34 PM   Specimen: Urine, Clean Catch  Result Value Ref Range Status   Specimen Description URINE, CLEAN CATCH  Final   Special Requests   Final    NONE Reflexed from [id number] Performed at [facility], [address], [place], [place] [address]    Culture >=100,000 COLONIES/mL ESCHERICHIA COLI (A)  Final   Report Status 06/02/2023  FINAL  Final   Organism ID, Bacteria ESCHERICHIA COLI (A)  Final      Susceptibility   Escherichia coli - MIC*    AMPICILLIN >=32 RESISTANT Resistant     CEFAZOLIN <=4 SENSITIVE Sensitive     CEFEPIME <=0.12 SENSITIVE Sensitive     CEFTRIAXONE <=0.25 SENSITIVE Sensitive     CIPROFLOXACIN <=0.25 SENSITIVE Sensitive     GENTAMICIN <=1 SENSITIVE Sensitive     IMIPENEM <=0.25 SENSITIVE Sensitive     NITROFURANTOIN <=16 SENSITIVE Sensitive     TRIMETH/SULFA <=20 SENSITIVE Sensitive  AMPICILLIN/SULBACTAM 16 INTERMEDIATE Intermediate     PIP/TAZO <=4 SENSITIVE Sensitive     * >=100,000 COLONIES/mL ESCHERICHIA COLI  Blood culture (routine x 2)     Status: None (Preliminary result)   Collection Time: 05/31/23  8:15 PM   Specimen: BLOOD LEFT ARM  Result Value Ref Range Status   Specimen Description BLOOD LEFT ARM  Final   Special Requests   Final    BOTTLES DRAWN AEROBIC AND ANAEROBIC Blood Culture results may not be optimal due to an inadequate volume of blood received in culture bottles   Culture   Final    NO GROWTH 3 DAYS Performed at [facility], [address] [place], [place] [address]    Report Status PENDING  Incomplete  Blood culture (routine x 2)     Status: Abnormal (Preliminary result)   Collection Time: 05/31/23  8:24 PM   Specimen: BLOOD RIGHT ARM  Result Value Ref Range Status   Specimen Description BLOOD RIGHT ARM  Final   Special Requests   Final    BOTTLES DRAWN AEROBIC AND ANAEROBIC Blood Culture results may not be optimal due to an inadequate volume of blood received in culture bottles   Culture  Setup Time   Final    GRAM POSITIVE COCCI IN CLUSTERS IN BOTH AEROBIC AND ANAEROBIC BOTTLES CRITICAL RESULT CALLED TO, READ BACK BY AND VERIFIED WITH: PHARMD AUSTIN [id number] @1445  BY SM    Culture (A)  Final    STAPHYLOCOCCUS HOMINIS CULTURE REINCUBATED FOR BETTER GROWTH THE SIGNIFICANCE OF ISOLATING THIS ORGANISM FROM A SINGLE SET OF BLOOD CULTURES WHEN  MULTIPLE SETS ARE DRAWN IS UNCERTAIN. PLEASE NOTIFY THE MICROBIOLOGY DEPARTMENT WITHIN ONE WEEK IF SPECIATION AND SENSITIVITIES ARE REQUIRED. Performed at [facility], [address] [place], [place] [address]    Report Status PENDING  Incomplete  Blood Culture ID Panel (Reflexed)     Status: Abnormal   Collection Time: 05/31/23  8:24 PM  Result Value Ref Range Status   Enterococcus faecalis NOT DETECTED NOT DETECTED Final   Enterococcus Faecium NOT DETECTED NOT DETECTED Final   Listeria monocytogenes NOT DETECTED NOT DETECTED Final   Staphylococcus species DETECTED (A) NOT DETECTED Final    Comment: CRITICAL RESULT CALLED TO, READ BACK BY AND VERIFIED WITH: PHARMD AUSTIN [id number] @1445  BY SM    Staphylococcus aureus (BCID) NOT DETECTED NOT DETECTED Final   Staphylococcus epidermidis DETECTED (A) NOT DETECTED Final    Comment: CRITICAL RESULT CALLED TO, READ BACK BY AND VERIFIED WITH: PHARMD AUSTIN [id number] @1445  BY SM    Staphylococcus lugdunensis NOT DETECTED NOT DETECTED Final   Streptococcus species NOT DETECTED NOT DETECTED Final   Streptococcus agalactiae NOT DETECTED NOT DETECTED Final   Streptococcus pneumoniae NOT DETECTED NOT DETECTED Final   Streptococcus pyogenes NOT DETECTED NOT DETECTED Final   A.calcoaceticus-baumannii NOT DETECTED NOT DETECTED Final   Bacteroides fragilis NOT DETECTED NOT DETECTED Final   Enterobacterales NOT DETECTED NOT DETECTED Final   Enterobacter cloacae complex NOT DETECTED NOT DETECTED Final   Escherichia coli NOT DETECTED NOT DETECTED Final   Klebsiella aerogenes NOT DETECTED NOT DETECTED Final   Klebsiella oxytoca NOT DETECTED NOT DETECTED Final   Klebsiella pneumoniae NOT DETECTED NOT DETECTED Final   Proteus species NOT DETECTED NOT DETECTED Final   Salmonella species NOT DETECTED NOT DETECTED Final   Serratia marcescens NOT DETECTED NOT DETECTED Final   Haemophilus influenzae NOT DETECTED NOT DETECTED Final   Neisseria meningitidis  NOT  DETECTED NOT DETECTED Final   Pseudomonas aeruginosa NOT DETECTED NOT DETECTED Final   Stenotrophomonas maltophilia NOT DETECTED NOT DETECTED Final   Candida albicans NOT DETECTED NOT DETECTED Final   Candida auris NOT DETECTED NOT DETECTED Final   Candida glabrata NOT DETECTED NOT DETECTED Final   Candida krusei NOT DETECTED NOT DETECTED Final   Candida parapsilosis NOT DETECTED NOT DETECTED Final   Candida tropicalis NOT DETECTED NOT DETECTED Final   Cryptococcus neoformans/gattii NOT DETECTED NOT DETECTED Final   Methicillin resistance mecA/C NOT DETECTED NOT DETECTED Final    Comment: Performed at [facility], [address] [place], [place] [address]     Labs: Basic Metabolic Panel: Recent Labs  Lab 05/31/23 1936 06/01/23 0035 06/01/23 0057 06/02/23 0234  NA 137 137 141 133*  K 4.5 4.2 4.3 4.2  CL 110 109  --  106  CO2 19* 20*  --  21*  GLUCOSE 118* 111*  --  83  BUN 41* 39*  --  27*  CREATININE 1.67* 1.42*  --  1.02  CALCIUM 10.4* 10.2  --  9.6  MG 2.4 2.3  --  2.0  PHOS  --  2.7  --  2.0*   Liver Function Tests: Recent Labs  Lab 05/31/23 1936 06/01/23 0035 06/02/23 0234  AST 31 53* 26  ALT 22 41 27  ALKPHOS 86 81 68  BILITOT 0.6 0.5 0.6  PROT 7.5 6.9 6.1*  ALBUMIN 3.5 3.2* 2.6*   No results for input(s): "LIPASE", "AMYLASE" in the last 168 hours. Recent Labs  Lab 06/01/23 0433  AMMONIA 17   CBC: Recent Labs  Lab 05/31/23 1936 06/01/23 0035 06/01/23 0057 06/02/23 0234  WBC 19.6* 15.4*  --  12.7*  NEUTROABS 17.8* 13.1*  --  9.9*  HGB 14.0 13.3 12.9* 12.4*  HCT 42.4 40.7 38.0* 37.0*  MCV 100.5* 101.2*  --  96.1  PLT 215 205  --  196   Cardiac Enzymes: Recent Labs  Lab 05/31/23 1936  CKTOTAL 45*   BNP: BNP (last 3 results) Recent Labs    05/31/23 1936  BNP 410.6*    ProBNP (last 3 results) No results for input(s): "PROBNP" in the last 8760 hours.  CBG: No results for input(s): "GLUCAP" in the last 168  hours.     Signed:  Rhetta Mura MD   [facility] 06/03/2023, 10:23 AM

## 2023-06-03 NOTE — Care Management Important Message (Signed)
Important Message  Patient Details  Name: [name] [name] MRN: [id number] Date of Birth: 1941/07/08   Medicare Important Message Given:  Yes     Efstathios Sawin Stefan Church 06/03/2023, 12:51 PM

## 2023-06-03 NOTE — Plan of Care (Signed)
Patient AOX4, forgetful, VSS throughout shift.  Diminished lungs, IS encouraged.  All meds given on time as ordered.  Pt voided in bathroom.  POC maintained, will continue to monitor.  Problem: Education: Goal: Knowledge of General Education information will improve Description: Including pain rating scale, medication(s)/side effects and non-pharmacologic comfort measures Outcome: Progressing   Problem: Health Behavior/Discharge Planning: Goal: Ability to manage health-related needs will improve Outcome: Progressing   Problem: Clinical Measurements: Goal: Ability to maintain clinical measurements within normal limits will improve Outcome: Progressing Goal: Will remain free from infection Outcome: Progressing Goal: Diagnostic test results will improve Outcome: Progressing Goal: Respiratory complications will improve Outcome: Progressing Goal: Cardiovascular complication will be avoided Outcome: Progressing   Problem: Activity: Goal: Risk for activity intolerance will decrease Outcome: Progressing   Problem: Nutrition: Goal: Adequate nutrition will be maintained Outcome: Progressing   Problem: Coping: Goal: Level of anxiety will decrease Outcome: Progressing   Problem: Elimination: Goal: Will not experience complications related to bowel motility Outcome: Progressing Goal: Will not experience complications related to urinary retention Outcome: Progressing   Problem: Pain Managment: Goal: General experience of comfort will improve Outcome: Progressing   Problem: Safety: Goal: Ability to remain free from injury will improve Outcome: Progressing   Problem: Skin Integrity: Goal: Risk for impaired skin integrity will decrease Outcome: Progressing

## 2023-06-03 NOTE — Progress Notes (Signed)
PT Cancellation Note  Patient Details Name: [name] [name] MRN: [id number] DOB: 26-Mar-1941   Cancelled Treatment:    Reason Eval/Treat Not Completed: Other (comment).  Declined PT today, asked to reconsider and have nursing let PT know.   Ivar Drape 06/03/2023, 11:40 AM  Samul Dada, PT PhD Acute Rehab Dept. Number: [facility] [id number] and [facility] [phone]

## 2023-06-03 NOTE — TOC Progression Note (Signed)
Transition of Care [facility]) - Progression Note    Patient Details  Name: [name] [name] MRN: [id number] Date of Birth: 04-21-1941  Transition of Care [facility]) CM/SW Contact  Janae Bridgeman, RN Phone Number: 06/03/2023, 9:51 AM  Clinical Narrative:    CM called and spoke with patient's daughter-in-law, [name] [name] on the phone and the family is driving to [place] today to provide care for the patient in his home when he is medically stable for discharge.  I provided the patient's daughter with contact information to call [employer] to provide meals in the home for 7 days for [employer] provider.  Patients family will provide transportation to home by car once patient is safe to discharge.  Family will arrive to [facility] around 4 pm today.  Attending MD and bedside nursing are aware.   Expected Discharge Plan: Home w Home Health Services Barriers to Discharge: Continued Medical Work up  Expected Discharge Plan and Services   Discharge Planning Services: CM Consult Post Acute Care Choice: Home Health Living arrangements for the past 2 months: Single Family Home                           HH Arranged: PT, Nurse's Aide HH Agency: [facility] Date [facility] Agency Contacted: 06/02/23 Time HH Agency Contacted: [phone] Representative spoke with at [facility] Agency: Kandee Keen, RNCM accepted for PT, aide   Social Determinants of Health (SDOH) Interventions SDOH Screenings   Food Insecurity: No Food Insecurity (06/01/2023)  Housing: Low Risk  (06/01/2023)  Transportation Needs: No Transportation Needs (06/01/2023)  Utilities: Not At Risk (06/01/2023)  Alcohol Screen: Low Risk  (03/19/2022)  Financial Resource Strain: Low Risk  (03/19/2022)  Tobacco Use: Medium Risk (05/31/2023)    Readmission Risk Interventions    06/02/2023   11:00 AM  Readmission Risk Prevention Plan  Post Dischage Appt Complete  Medication Screening Complete  Transportation Screening Complete

## 2023-06-04 DIAGNOSIS — I130 Hypertensive heart and chronic kidney disease with heart failure and stage 1 through stage 4 chronic kidney disease, or unspecified chronic kidney disease: Secondary | ICD-10-CM | POA: Diagnosis not present

## 2023-06-04 DIAGNOSIS — R4182 Altered mental status, unspecified: Secondary | ICD-10-CM | POA: Diagnosis not present

## 2023-06-04 DIAGNOSIS — I272 Pulmonary hypertension, unspecified: Secondary | ICD-10-CM | POA: Diagnosis not present

## 2023-06-04 DIAGNOSIS — I5042 Chronic combined systolic (congestive) and diastolic (congestive) heart failure: Secondary | ICD-10-CM | POA: Diagnosis not present

## 2023-06-04 DIAGNOSIS — I25810 Atherosclerosis of coronary artery bypass graft(s) without angina pectoris: Secondary | ICD-10-CM | POA: Diagnosis not present

## 2023-06-04 DIAGNOSIS — N1831 Chronic kidney disease, stage 3a: Secondary | ICD-10-CM | POA: Diagnosis not present

## 2023-06-04 DIAGNOSIS — E46 Unspecified protein-calorie malnutrition: Secondary | ICD-10-CM | POA: Diagnosis not present

## 2023-06-04 DIAGNOSIS — I739 Peripheral vascular disease, unspecified: Secondary | ICD-10-CM | POA: Diagnosis not present

## 2023-06-04 DIAGNOSIS — G9341 Metabolic encephalopathy: Secondary | ICD-10-CM | POA: Diagnosis not present

## 2023-06-04 DIAGNOSIS — I4819 Other persistent atrial fibrillation: Secondary | ICD-10-CM | POA: Diagnosis not present

## 2023-06-04 LAB — CULTURE, BLOOD (ROUTINE X 2): Culture: NO GROWTH

## 2023-06-05 LAB — CULTURE, BLOOD (ROUTINE X 2)

## 2023-06-21 DIAGNOSIS — I5042 Chronic combined systolic (congestive) and diastolic (congestive) heart failure: Secondary | ICD-10-CM | POA: Diagnosis not present

## 2023-06-21 DIAGNOSIS — I110 Hypertensive heart disease with heart failure: Secondary | ICD-10-CM | POA: Diagnosis not present

## 2023-06-21 DIAGNOSIS — I10 Essential (primary) hypertension: Secondary | ICD-10-CM | POA: Diagnosis not present

## 2023-06-30 ENCOUNTER — Telehealth (HOSPITAL_COMMUNITY): Payer: Self-pay | Admitting: Internal Medicine

## 2023-07-19 DIAGNOSIS — E7800 Pure hypercholesterolemia, unspecified: Secondary | ICD-10-CM | POA: Diagnosis not present

## 2023-07-19 DIAGNOSIS — R413 Other amnesia: Secondary | ICD-10-CM | POA: Diagnosis not present

## 2023-07-19 DIAGNOSIS — I130 Hypertensive heart and chronic kidney disease with heart failure and stage 1 through stage 4 chronic kidney disease, or unspecified chronic kidney disease: Secondary | ICD-10-CM | POA: Diagnosis not present

## 2023-07-19 DIAGNOSIS — N1831 Chronic kidney disease, stage 3a: Secondary | ICD-10-CM | POA: Diagnosis not present

## 2023-07-19 DIAGNOSIS — I480 Paroxysmal atrial fibrillation: Secondary | ICD-10-CM | POA: Diagnosis not present

## 2023-07-19 DIAGNOSIS — I5042 Chronic combined systolic (congestive) and diastolic (congestive) heart failure: Secondary | ICD-10-CM | POA: Diagnosis not present

## 2023-07-27 DIAGNOSIS — R404 Transient alteration of awareness: Secondary | ICD-10-CM | POA: Diagnosis not present

## 2023-08-15 DEATH — deceased

## 2023-10-21 ENCOUNTER — Telehealth: Payer: Self-pay | Admitting: Adult Health

## 2023-10-21 NOTE — Telephone Encounter (Signed)
Noted, we are sorry for their loss

## 2023-10-21 NOTE — Telephone Encounter (Signed)
Called to confirm appt for 11/11 and was told by dtr in law he has passed away

## 2023-10-25 ENCOUNTER — Ambulatory Visit: Payer: Medicare HMO | Admitting: Neurology
